# Patient Record
Sex: Female | Born: 1935
Health system: Southern US, Community
[De-identification: ages and names within clinical notes are randomized; demographics above are authoritative.]

## PROBLEM LIST (undated history)

## (undated) DIAGNOSIS — K449 Diaphragmatic hernia without obstruction or gangrene: Secondary | ICD-10-CM

## (undated) DIAGNOSIS — M069 Rheumatoid arthritis, unspecified: Secondary | ICD-10-CM

## (undated) DIAGNOSIS — K469 Unspecified abdominal hernia without obstruction or gangrene: Secondary | ICD-10-CM

## (undated) DIAGNOSIS — K219 Gastro-esophageal reflux disease without esophagitis: Secondary | ICD-10-CM

## (undated) DIAGNOSIS — E785 Hyperlipidemia, unspecified: Secondary | ICD-10-CM

## (undated) DIAGNOSIS — I5032 Chronic diastolic (congestive) heart failure: Secondary | ICD-10-CM

## (undated) DIAGNOSIS — Z9181 History of falling: Secondary | ICD-10-CM

## (undated) DIAGNOSIS — I4891 Unspecified atrial fibrillation: Secondary | ICD-10-CM

## (undated) DIAGNOSIS — G629 Polyneuropathy, unspecified: Secondary | ICD-10-CM

## (undated) DIAGNOSIS — M810 Age-related osteoporosis without current pathological fracture: Secondary | ICD-10-CM

## (undated) DIAGNOSIS — S32512D Fracture of superior rim of left pubis, subsequent encounter for fracture with routine healing: Secondary | ICD-10-CM

## (undated) DIAGNOSIS — M40209 Unspecified kyphosis, site unspecified: Secondary | ICD-10-CM

## (undated) HISTORY — DX: Fracture of superior rim of left pubis, subsequent encounter for fracture with routine healing: S32.512D

## (undated) HISTORY — DX: Age-related osteoporosis without current pathological fracture: M81.0

## (undated) HISTORY — DX: Diaphragmatic hernia without obstruction or gangrene: K44.9

## (undated) HISTORY — DX: Hyperlipidemia, unspecified: E78.5

## (undated) HISTORY — DX: Rheumatoid arthritis, unspecified: M06.9

## (undated) HISTORY — DX: Polyneuropathy, unspecified: G62.9

## (undated) HISTORY — DX: Gastro-esophageal reflux disease without esophagitis: K21.9

## (undated) HISTORY — PX: TONSILLECTOMY: SUR1361

## (undated) HISTORY — DX: History of falling: Z91.81

## (undated) HISTORY — PX: CATARACT EXTRACTION, BILATERAL: SHX1313

## (undated) HISTORY — PX: REPLACEMENT TOTAL HIP W/  RESURFACING IMPLANTS: SUR1222

## (undated) HISTORY — PX: ABDOMINAL HYSTERECTOMY: SHX81

## (undated) HISTORY — DX: Chronic diastolic (congestive) heart failure: I50.32

---

## 2016-09-04 DIAGNOSIS — I482 Chronic atrial fibrillation: Secondary | ICD-10-CM | POA: Diagnosis not present

## 2016-09-04 DIAGNOSIS — Z7901 Long term (current) use of anticoagulants: Secondary | ICD-10-CM | POA: Diagnosis not present

## 2016-09-18 DIAGNOSIS — I482 Chronic atrial fibrillation: Secondary | ICD-10-CM | POA: Diagnosis not present

## 2016-09-18 DIAGNOSIS — Z7901 Long term (current) use of anticoagulants: Secondary | ICD-10-CM | POA: Diagnosis not present

## 2016-09-25 DIAGNOSIS — Z7901 Long term (current) use of anticoagulants: Secondary | ICD-10-CM | POA: Diagnosis not present

## 2016-09-25 DIAGNOSIS — I482 Chronic atrial fibrillation: Secondary | ICD-10-CM | POA: Diagnosis not present

## 2016-10-02 DIAGNOSIS — L97511 Non-pressure chronic ulcer of other part of right foot limited to breakdown of skin: Secondary | ICD-10-CM | POA: Diagnosis not present

## 2016-10-02 DIAGNOSIS — M2041 Other hammer toe(s) (acquired), right foot: Secondary | ICD-10-CM | POA: Diagnosis not present

## 2016-10-02 DIAGNOSIS — I739 Peripheral vascular disease, unspecified: Secondary | ICD-10-CM | POA: Diagnosis not present

## 2016-10-29 DIAGNOSIS — R202 Paresthesia of skin: Secondary | ICD-10-CM | POA: Diagnosis not present

## 2016-10-29 DIAGNOSIS — M25512 Pain in left shoulder: Secondary | ICD-10-CM | POA: Diagnosis not present

## 2016-10-29 DIAGNOSIS — M1990 Unspecified osteoarthritis, unspecified site: Secondary | ICD-10-CM | POA: Diagnosis not present

## 2016-10-29 DIAGNOSIS — Q820 Hereditary lymphedema: Secondary | ICD-10-CM | POA: Diagnosis not present

## 2016-10-29 DIAGNOSIS — Z96642 Presence of left artificial hip joint: Secondary | ICD-10-CM | POA: Diagnosis not present

## 2016-10-29 DIAGNOSIS — M25552 Pain in left hip: Secondary | ICD-10-CM | POA: Diagnosis not present

## 2016-10-29 DIAGNOSIS — M47816 Spondylosis without myelopathy or radiculopathy, lumbar region: Secondary | ICD-10-CM | POA: Diagnosis not present

## 2016-11-04 DIAGNOSIS — Z5181 Encounter for therapeutic drug level monitoring: Secondary | ICD-10-CM | POA: Diagnosis not present

## 2016-11-04 DIAGNOSIS — M1990 Unspecified osteoarthritis, unspecified site: Secondary | ICD-10-CM | POA: Diagnosis not present

## 2016-11-04 DIAGNOSIS — T148XXA Other injury of unspecified body region, initial encounter: Secondary | ICD-10-CM | POA: Diagnosis not present

## 2016-11-04 DIAGNOSIS — M25512 Pain in left shoulder: Secondary | ICD-10-CM | POA: Diagnosis not present

## 2016-11-04 DIAGNOSIS — Z79899 Other long term (current) drug therapy: Secondary | ICD-10-CM | POA: Diagnosis not present

## 2016-11-04 DIAGNOSIS — I4891 Unspecified atrial fibrillation: Secondary | ICD-10-CM | POA: Diagnosis not present

## 2016-11-04 DIAGNOSIS — I5032 Chronic diastolic (congestive) heart failure: Secondary | ICD-10-CM | POA: Diagnosis not present

## 2016-11-04 DIAGNOSIS — Z7901 Long term (current) use of anticoagulants: Secondary | ICD-10-CM | POA: Diagnosis not present

## 2016-11-08 DIAGNOSIS — Z79899 Other long term (current) drug therapy: Secondary | ICD-10-CM | POA: Diagnosis not present

## 2016-11-08 DIAGNOSIS — M19019 Primary osteoarthritis, unspecified shoulder: Secondary | ICD-10-CM | POA: Diagnosis not present

## 2016-11-08 DIAGNOSIS — Z7901 Long term (current) use of anticoagulants: Secondary | ICD-10-CM | POA: Diagnosis not present

## 2016-11-08 DIAGNOSIS — I5032 Chronic diastolic (congestive) heart failure: Secondary | ICD-10-CM | POA: Diagnosis not present

## 2016-11-22 DIAGNOSIS — M25512 Pain in left shoulder: Secondary | ICD-10-CM | POA: Diagnosis not present

## 2016-11-22 DIAGNOSIS — M19012 Primary osteoarthritis, left shoulder: Secondary | ICD-10-CM | POA: Diagnosis not present

## 2016-11-22 DIAGNOSIS — Z7901 Long term (current) use of anticoagulants: Secondary | ICD-10-CM | POA: Diagnosis not present

## 2016-11-22 DIAGNOSIS — I4891 Unspecified atrial fibrillation: Secondary | ICD-10-CM | POA: Diagnosis not present

## 2016-11-22 DIAGNOSIS — R0602 Shortness of breath: Secondary | ICD-10-CM | POA: Diagnosis not present

## 2016-11-22 DIAGNOSIS — I5032 Chronic diastolic (congestive) heart failure: Secondary | ICD-10-CM | POA: Diagnosis not present

## 2016-11-22 DIAGNOSIS — Z79899 Other long term (current) drug therapy: Secondary | ICD-10-CM | POA: Diagnosis not present

## 2016-11-22 DIAGNOSIS — R413 Other amnesia: Secondary | ICD-10-CM | POA: Diagnosis not present

## 2016-11-22 DIAGNOSIS — I739 Peripheral vascular disease, unspecified: Secondary | ICD-10-CM | POA: Diagnosis not present

## 2016-11-22 LAB — BASIC METABOLIC PANEL
BUN: 10 (ref 4–21)
CREATININE: 0.8 (ref <=1.1)
GLUCOSE: 98
Potassium: 4.4 (ref 3.4–5.3)
SODIUM: 136 — AB (ref 137–147)

## 2016-11-22 LAB — HEPATIC FUNCTION PANEL
ALT: 15 (ref 7–35)
AST: 32 (ref 13–35)
Alkaline Phosphatase: 97 (ref 25–125)

## 2016-12-04 DIAGNOSIS — I482 Chronic atrial fibrillation: Secondary | ICD-10-CM | POA: Diagnosis not present

## 2016-12-04 DIAGNOSIS — Z7901 Long term (current) use of anticoagulants: Secondary | ICD-10-CM | POA: Diagnosis not present

## 2016-12-09 DIAGNOSIS — M2041 Other hammer toe(s) (acquired), right foot: Secondary | ICD-10-CM | POA: Diagnosis not present

## 2016-12-09 DIAGNOSIS — I739 Peripheral vascular disease, unspecified: Secondary | ICD-10-CM | POA: Diagnosis not present

## 2016-12-09 DIAGNOSIS — L603 Nail dystrophy: Secondary | ICD-10-CM | POA: Diagnosis not present

## 2016-12-09 DIAGNOSIS — L97511 Non-pressure chronic ulcer of other part of right foot limited to breakdown of skin: Secondary | ICD-10-CM | POA: Diagnosis not present

## 2017-01-01 DIAGNOSIS — Z7901 Long term (current) use of anticoagulants: Secondary | ICD-10-CM | POA: Diagnosis not present

## 2017-01-01 DIAGNOSIS — M1990 Unspecified osteoarthritis, unspecified site: Secondary | ICD-10-CM | POA: Diagnosis not present

## 2017-01-01 DIAGNOSIS — L0390 Cellulitis, unspecified: Secondary | ICD-10-CM | POA: Diagnosis not present

## 2017-01-01 DIAGNOSIS — I4891 Unspecified atrial fibrillation: Secondary | ICD-10-CM | POA: Diagnosis not present

## 2017-01-01 DIAGNOSIS — Z5181 Encounter for therapeutic drug level monitoring: Secondary | ICD-10-CM | POA: Diagnosis not present

## 2017-01-01 DIAGNOSIS — N3001 Acute cystitis with hematuria: Secondary | ICD-10-CM | POA: Diagnosis not present

## 2017-01-09 DIAGNOSIS — Z5181 Encounter for therapeutic drug level monitoring: Secondary | ICD-10-CM | POA: Diagnosis not present

## 2017-01-09 DIAGNOSIS — Z7901 Long term (current) use of anticoagulants: Secondary | ICD-10-CM | POA: Diagnosis not present

## 2017-01-09 DIAGNOSIS — L0390 Cellulitis, unspecified: Secondary | ICD-10-CM | POA: Diagnosis not present

## 2017-01-29 DIAGNOSIS — M150 Primary generalized (osteo)arthritis: Secondary | ICD-10-CM | POA: Diagnosis not present

## 2017-01-29 DIAGNOSIS — M154 Erosive (osteo)arthritis: Secondary | ICD-10-CM | POA: Diagnosis not present

## 2017-01-29 DIAGNOSIS — M119 Crystal arthropathy, unspecified: Secondary | ICD-10-CM | POA: Diagnosis not present

## 2017-02-05 DIAGNOSIS — Z7901 Long term (current) use of anticoagulants: Secondary | ICD-10-CM | POA: Diagnosis not present

## 2017-02-05 DIAGNOSIS — I482 Chronic atrial fibrillation: Secondary | ICD-10-CM | POA: Diagnosis not present

## 2017-02-10 DIAGNOSIS — Z7901 Long term (current) use of anticoagulants: Secondary | ICD-10-CM | POA: Diagnosis not present

## 2017-02-10 DIAGNOSIS — I482 Chronic atrial fibrillation: Secondary | ICD-10-CM | POA: Diagnosis not present

## 2017-02-17 DIAGNOSIS — I482 Chronic atrial fibrillation: Secondary | ICD-10-CM | POA: Diagnosis not present

## 2017-02-17 DIAGNOSIS — Z7901 Long term (current) use of anticoagulants: Secondary | ICD-10-CM | POA: Diagnosis not present

## 2017-02-26 DIAGNOSIS — L97511 Non-pressure chronic ulcer of other part of right foot limited to breakdown of skin: Secondary | ICD-10-CM | POA: Diagnosis not present

## 2017-02-26 DIAGNOSIS — M2041 Other hammer toe(s) (acquired), right foot: Secondary | ICD-10-CM | POA: Diagnosis not present

## 2017-02-26 DIAGNOSIS — M2042 Other hammer toe(s) (acquired), left foot: Secondary | ICD-10-CM | POA: Diagnosis not present

## 2017-03-14 DIAGNOSIS — Z7901 Long term (current) use of anticoagulants: Secondary | ICD-10-CM | POA: Diagnosis not present

## 2017-03-14 DIAGNOSIS — I482 Chronic atrial fibrillation: Secondary | ICD-10-CM | POA: Diagnosis not present

## 2017-03-27 DIAGNOSIS — Q820 Hereditary lymphedema: Secondary | ICD-10-CM | POA: Diagnosis not present

## 2017-03-27 DIAGNOSIS — I5032 Chronic diastolic (congestive) heart failure: Secondary | ICD-10-CM | POA: Diagnosis not present

## 2017-03-27 DIAGNOSIS — Z7189 Other specified counseling: Secondary | ICD-10-CM | POA: Diagnosis not present

## 2017-03-27 DIAGNOSIS — M1990 Unspecified osteoarthritis, unspecified site: Secondary | ICD-10-CM | POA: Diagnosis not present

## 2017-03-27 DIAGNOSIS — Z0000 Encounter for general adult medical examination without abnormal findings: Secondary | ICD-10-CM | POA: Diagnosis not present

## 2017-03-27 DIAGNOSIS — I739 Peripheral vascular disease, unspecified: Secondary | ICD-10-CM | POA: Diagnosis not present

## 2017-03-27 DIAGNOSIS — K449 Diaphragmatic hernia without obstruction or gangrene: Secondary | ICD-10-CM | POA: Diagnosis not present

## 2017-03-27 DIAGNOSIS — Z7901 Long term (current) use of anticoagulants: Secondary | ICD-10-CM | POA: Diagnosis not present

## 2017-03-27 DIAGNOSIS — R413 Other amnesia: Secondary | ICD-10-CM | POA: Diagnosis not present

## 2017-03-27 DIAGNOSIS — I482 Chronic atrial fibrillation: Secondary | ICD-10-CM | POA: Diagnosis not present

## 2017-03-27 DIAGNOSIS — Z79899 Other long term (current) drug therapy: Secondary | ICD-10-CM | POA: Diagnosis not present

## 2017-04-01 DIAGNOSIS — Z79899 Other long term (current) drug therapy: Secondary | ICD-10-CM | POA: Diagnosis not present

## 2017-04-01 DIAGNOSIS — R300 Dysuria: Secondary | ICD-10-CM | POA: Diagnosis not present

## 2017-04-09 DIAGNOSIS — Z7901 Long term (current) use of anticoagulants: Secondary | ICD-10-CM | POA: Diagnosis not present

## 2017-04-09 DIAGNOSIS — I482 Chronic atrial fibrillation: Secondary | ICD-10-CM | POA: Diagnosis not present

## 2017-04-11 DIAGNOSIS — L97522 Non-pressure chronic ulcer of other part of left foot with fat layer exposed: Secondary | ICD-10-CM | POA: Diagnosis not present

## 2017-04-11 DIAGNOSIS — L089 Local infection of the skin and subcutaneous tissue, unspecified: Secondary | ICD-10-CM | POA: Diagnosis not present

## 2017-04-16 DIAGNOSIS — I482 Chronic atrial fibrillation: Secondary | ICD-10-CM | POA: Diagnosis not present

## 2017-04-16 DIAGNOSIS — Z7901 Long term (current) use of anticoagulants: Secondary | ICD-10-CM | POA: Diagnosis not present

## 2017-04-30 DIAGNOSIS — L97521 Non-pressure chronic ulcer of other part of left foot limited to breakdown of skin: Secondary | ICD-10-CM | POA: Diagnosis not present

## 2017-04-30 DIAGNOSIS — I482 Chronic atrial fibrillation: Secondary | ICD-10-CM | POA: Diagnosis not present

## 2017-04-30 DIAGNOSIS — Z7901 Long term (current) use of anticoagulants: Secondary | ICD-10-CM | POA: Diagnosis not present

## 2017-05-05 DIAGNOSIS — Z7901 Long term (current) use of anticoagulants: Secondary | ICD-10-CM | POA: Diagnosis not present

## 2017-05-05 DIAGNOSIS — I482 Chronic atrial fibrillation: Secondary | ICD-10-CM | POA: Diagnosis not present

## 2017-05-12 DIAGNOSIS — Z7901 Long term (current) use of anticoagulants: Secondary | ICD-10-CM | POA: Diagnosis not present

## 2017-05-12 DIAGNOSIS — I482 Chronic atrial fibrillation: Secondary | ICD-10-CM | POA: Diagnosis not present

## 2017-05-26 DIAGNOSIS — I482 Chronic atrial fibrillation: Secondary | ICD-10-CM | POA: Diagnosis not present

## 2017-05-26 DIAGNOSIS — Z7901 Long term (current) use of anticoagulants: Secondary | ICD-10-CM | POA: Diagnosis not present

## 2017-06-02 DIAGNOSIS — M2042 Other hammer toe(s) (acquired), left foot: Secondary | ICD-10-CM | POA: Diagnosis not present

## 2017-06-02 DIAGNOSIS — I482 Chronic atrial fibrillation: Secondary | ICD-10-CM | POA: Diagnosis not present

## 2017-06-02 DIAGNOSIS — B351 Tinea unguium: Secondary | ICD-10-CM | POA: Diagnosis not present

## 2017-06-02 DIAGNOSIS — L97521 Non-pressure chronic ulcer of other part of left foot limited to breakdown of skin: Secondary | ICD-10-CM | POA: Diagnosis not present

## 2017-06-02 DIAGNOSIS — I739 Peripheral vascular disease, unspecified: Secondary | ICD-10-CM | POA: Diagnosis not present

## 2017-06-02 DIAGNOSIS — M2041 Other hammer toe(s) (acquired), right foot: Secondary | ICD-10-CM | POA: Diagnosis not present

## 2017-06-02 DIAGNOSIS — Z7901 Long term (current) use of anticoagulants: Secondary | ICD-10-CM | POA: Diagnosis not present

## 2017-06-09 DIAGNOSIS — R600 Localized edema: Secondary | ICD-10-CM | POA: Diagnosis not present

## 2017-06-09 DIAGNOSIS — L97522 Non-pressure chronic ulcer of other part of left foot with fat layer exposed: Secondary | ICD-10-CM | POA: Diagnosis not present

## 2017-06-09 DIAGNOSIS — I482 Chronic atrial fibrillation: Secondary | ICD-10-CM | POA: Diagnosis not present

## 2017-06-09 DIAGNOSIS — Z7901 Long term (current) use of anticoagulants: Secondary | ICD-10-CM | POA: Diagnosis not present

## 2017-06-17 DIAGNOSIS — L97522 Non-pressure chronic ulcer of other part of left foot with fat layer exposed: Secondary | ICD-10-CM | POA: Diagnosis not present

## 2017-06-17 DIAGNOSIS — R600 Localized edema: Secondary | ICD-10-CM | POA: Diagnosis not present

## 2017-06-17 DIAGNOSIS — M2042 Other hammer toe(s) (acquired), left foot: Secondary | ICD-10-CM | POA: Diagnosis not present

## 2017-06-23 DIAGNOSIS — I4891 Unspecified atrial fibrillation: Secondary | ICD-10-CM | POA: Diagnosis not present

## 2017-06-23 DIAGNOSIS — G8929 Other chronic pain: Secondary | ICD-10-CM | POA: Diagnosis not present

## 2017-06-23 DIAGNOSIS — W19XXXA Unspecified fall, initial encounter: Secondary | ICD-10-CM | POA: Diagnosis not present

## 2017-06-23 DIAGNOSIS — Q820 Hereditary lymphedema: Secondary | ICD-10-CM | POA: Diagnosis not present

## 2017-06-23 DIAGNOSIS — E7800 Pure hypercholesterolemia, unspecified: Secondary | ICD-10-CM | POA: Diagnosis not present

## 2017-06-23 DIAGNOSIS — I10 Essential (primary) hypertension: Secondary | ICD-10-CM | POA: Diagnosis not present

## 2017-06-23 DIAGNOSIS — S4982XA Other specified injuries of left shoulder and upper arm, initial encounter: Secondary | ICD-10-CM | POA: Diagnosis not present

## 2017-06-23 DIAGNOSIS — K219 Gastro-esophageal reflux disease without esophagitis: Secondary | ICD-10-CM | POA: Diagnosis not present

## 2017-06-23 DIAGNOSIS — Z87891 Personal history of nicotine dependence: Secondary | ICD-10-CM | POA: Diagnosis not present

## 2017-06-23 DIAGNOSIS — Z7901 Long term (current) use of anticoagulants: Secondary | ICD-10-CM | POA: Diagnosis not present

## 2017-06-23 DIAGNOSIS — I2510 Atherosclerotic heart disease of native coronary artery without angina pectoris: Secondary | ICD-10-CM | POA: Diagnosis not present

## 2017-06-23 DIAGNOSIS — M25512 Pain in left shoulder: Secondary | ICD-10-CM | POA: Diagnosis not present

## 2017-06-23 DIAGNOSIS — R51 Headache: Secondary | ICD-10-CM | POA: Diagnosis not present

## 2017-06-23 DIAGNOSIS — I739 Peripheral vascular disease, unspecified: Secondary | ICD-10-CM | POA: Diagnosis not present

## 2017-06-23 DIAGNOSIS — I059 Rheumatic mitral valve disease, unspecified: Secondary | ICD-10-CM | POA: Diagnosis not present

## 2017-06-23 DIAGNOSIS — M791 Myalgia, unspecified site: Secondary | ICD-10-CM | POA: Diagnosis not present

## 2017-06-23 DIAGNOSIS — K449 Diaphragmatic hernia without obstruction or gangrene: Secondary | ICD-10-CM | POA: Diagnosis not present

## 2017-06-23 DIAGNOSIS — Z888 Allergy status to other drugs, medicaments and biological substances status: Secondary | ICD-10-CM | POA: Diagnosis not present

## 2017-06-23 DIAGNOSIS — M542 Cervicalgia: Secondary | ICD-10-CM | POA: Diagnosis not present

## 2017-06-23 DIAGNOSIS — Z79899 Other long term (current) drug therapy: Secondary | ICD-10-CM | POA: Diagnosis not present

## 2017-06-23 DIAGNOSIS — I872 Venous insufficiency (chronic) (peripheral): Secondary | ICD-10-CM | POA: Diagnosis not present

## 2017-06-23 DIAGNOSIS — Z882 Allergy status to sulfonamides status: Secondary | ICD-10-CM | POA: Diagnosis not present

## 2017-06-23 DIAGNOSIS — Z91040 Latex allergy status: Secondary | ICD-10-CM | POA: Diagnosis not present

## 2017-06-23 DIAGNOSIS — Z86718 Personal history of other venous thrombosis and embolism: Secondary | ICD-10-CM | POA: Diagnosis not present

## 2017-07-04 DIAGNOSIS — Z7901 Long term (current) use of anticoagulants: Secondary | ICD-10-CM | POA: Diagnosis not present

## 2017-07-11 ENCOUNTER — Ambulatory Visit (INDEPENDENT_AMBULATORY_CARE_PROVIDER_SITE_OTHER): Payer: Medicare Other | Admitting: Family Medicine

## 2017-07-11 ENCOUNTER — Encounter: Payer: Self-pay | Admitting: Family Medicine

## 2017-07-11 ENCOUNTER — Ambulatory Visit: Payer: Self-pay | Admitting: Family Medicine

## 2017-07-11 VITALS — BP 106/62 | HR 87 | Temp 97.6°F | Ht 62.0 in | Wt 108.8 lb

## 2017-07-11 DIAGNOSIS — M1120 Other chondrocalcinosis, unspecified site: Secondary | ICD-10-CM

## 2017-07-11 DIAGNOSIS — M40202 Unspecified kyphosis, cervical region: Secondary | ICD-10-CM | POA: Diagnosis not present

## 2017-07-11 DIAGNOSIS — I5032 Chronic diastolic (congestive) heart failure: Secondary | ICD-10-CM | POA: Insufficient documentation

## 2017-07-11 DIAGNOSIS — M159 Polyosteoarthritis, unspecified: Secondary | ICD-10-CM

## 2017-07-11 DIAGNOSIS — I482 Chronic atrial fibrillation, unspecified: Secondary | ICD-10-CM | POA: Insufficient documentation

## 2017-07-11 DIAGNOSIS — I509 Heart failure, unspecified: Secondary | ICD-10-CM | POA: Diagnosis not present

## 2017-07-11 DIAGNOSIS — Z7689 Persons encountering health services in other specified circumstances: Secondary | ICD-10-CM | POA: Diagnosis not present

## 2017-07-11 DIAGNOSIS — I890 Lymphedema, not elsewhere classified: Secondary | ICD-10-CM | POA: Diagnosis not present

## 2017-07-11 DIAGNOSIS — G629 Polyneuropathy, unspecified: Secondary | ICD-10-CM | POA: Diagnosis not present

## 2017-07-11 DIAGNOSIS — M150 Primary generalized (osteo)arthritis: Secondary | ICD-10-CM | POA: Diagnosis not present

## 2017-07-11 DIAGNOSIS — M8949 Other hypertrophic osteoarthropathy, multiple sites: Secondary | ICD-10-CM

## 2017-07-11 LAB — POCT INR: INR: 2.7

## 2017-07-11 NOTE — Progress Notes (Signed)
Patient presents to clinic today to f/u on chronic issues and establish care.  Patient is accompanied by her daughter [name].  SUBJECTIVE: PMH:  Pt is an 83 yo with pmh sig for Afib, OA, pseudogout, neuropathy, lymphedema, kyphosis/scoliosis, CHF.  Patient was formally seen in [place].  Patient in the past is followed by cardiology and rheumatology.  "Bad spine": -Patient with a history of kyphosis/scoliosis -Gradually worsening -Lateral curvature of spine to the left causes patient to lean to the right increasing her propensity to fall  Osteoarthritis: -Patient seen in the past by rheumatology. -Patient endorses OA in shoulders, spine -Causes a deep pain and neuropathy -Patient has been on tramadol 50mg  q 6 prn and prednisone 5 mg for an extended period of time.  Pseudogout: -Patient states has been a while since a flare -Patient endorses changes in hands and nodes on finger 2/2 pseudogout -Patient on chronic prednisone 5 mg daily  Neuropathy: -Mostly in the left arm -Patient currently on gabapentin 400 mg TID  A. fib: -In the past was on Xarelto.  It was DC'd secondary to internal bleeding -Patient currently on Coumadin 5 mg daily. -INR was 3 on black Friday.  Patient's daughter had labs done at [facility]. -At times patient has issues staying in the range of 2-3.  If INR stays in the range has it checked q. monthly otherwise biweekly -Patient's daughter endorses trying to get a home INR machine from a company called [employer] in [place], [place].  Machine expected in 13 days  Lymphedma: -b/l LE -improving from past episodes. -elevating feet when can  CHF: -followed by Cardiology, Dr. [place] -needs new Cardiologist in town. -taking lasix 40 mg, lopressor 100mg  BID, torsemide 100 mg  Allergies: Sulfa-shortness of breath Codeine-nausea Latex-hives  Past surgical history: Several foot/bone surgeries Status post 3 blood transfusions in 2014 secondary to internal bleeding caused by  Xarelto Hysterectomy 1970 Breast biopsy Tonsillectomy  Social history: Patient is widowed.  She was formerly a .  She is now living with her daughter here in [place].  She was living in [place] by herself, but found she needed more help with things around the house.  Pt is a former smoker.  Patient denies current tobacco use, alcohol use, drug use.  Patient has had approximately 5 recent falls within the last 8 weeks.  She uses a walker to ambulate around the house.  Family medical history: Mom-deceased, heart disease, HLD, HTN, stroke Dad-deceased, heart disease, HLD, HTN Sister-Elaine, deceased, stroke Brother-Howard, deceased Daughter-alive Son-alive   Health Maintenance: Vision --Dr. [place] Immunizations --declines Colonoscopy --2014   History reviewed. No pertinent past medical history.  Past Surgical History:  Procedure Laterality Date  . ABDOMINAL HYSTERECTOMY    . TONSILLECTOMY      Current Outpatient Medications on File Prior to Visit  Medication Sig Dispense Refill  . atorvastatin (LIPITOR) 10 MG tablet Take 10 mg by mouth daily.    . Calcium-Magnesium-Vitamin D (CALCIUM 500 PO) Take by mouth.    . cyanocobalamin 1000 MCG tablet Take 1,000 mcg by mouth daily.    Alfonse Ras desloratadine (CLARINEX) 5 MG tablet Take 5 mg by mouth daily.    . furosemide (LASIX) 40 MG tablet Take 40 mg by mouth.    . gabapentin (NEURONTIN) 400 MG capsule Take 400 mg by mouth 3 (three) times daily.    . metoprolol tartrate (LOPRESSOR) 100 MG tablet Take 100 mg by mouth 2 (two) times daily.    . Multiple Vitamin (MULTIVITAMIN) capsule  Take 1 capsule by mouth daily.    Marland Kitchen omeprazole (PRILOSEC) 20 MG capsule Take 20 mg by mouth daily.    . prednisoLONE 5 MG TABS tablet Take by mouth.    . sertraline (ZOLOFT) 50 MG tablet Take 50 mg by mouth daily.    Marland Kitchen torsemide (DEMADEX) 100 MG tablet Take 100 mg by mouth daily.    . traMADol (ULTRAM) 50 MG tablet Take by mouth every 6 (six) hours as  needed.    Marland Kitchen VITAMIN D, CHOLECALCIFEROL, PO Take by mouth.     No current facility-administered medications on file prior to visit.     Allergies  Allergen Reactions  . Sulfa Antibiotics Anaphylaxis  . Codeine Nausea And Vomiting  . Latex Rash    History reviewed. No pertinent family history.  Social History   Socioeconomic History  . Marital status: Widowed    Spouse name: Not on file  . Number of children: Not on file  . Years of education: Not on file  . Highest education level: Not on file  Social Needs  . Financial resource strain: Not on file  . Food insecurity - worry: Not on file  . Food insecurity - inability: Not on file  . Transportation needs - medical: Not on file  . Transportation needs - non-medical: Not on file  Occupational History  . Not on file  Tobacco Use  . Smoking status: Former Games developer  . Smokeless tobacco: Never Used  Substance and Sexual Activity  . Alcohol use: No    Frequency: Never  . Drug use: No  . Sexual activity: Not on file  Other Topics Concern  . Not on file  Social History Narrative  . Not on file    ROS General: Denies fever, chills, night sweats, changes in weight, changes in appetite HEENT: Denies headaches, ear pain, changes in vision, rhinorrhea, sore throat CV: Denies CP, palpitations, SOB, orthopnea Pulm: Denies SOB, cough, wheezing GI: Denies abdominal pain, nausea, vomiting, diarrhea, constipation GU: Denies dysuria, hematuria, frequency, vaginal discharge Msk: Denies muscle cramps   +joint pains Neuro: Denies weakness, numbness, tingling  +ferquent falls Skin: Denies rashes, bruising   +changes 2/2 lymphedema Psych: Denies depression, anxiety, hallucinations  BP 106/62 (BP Location: Right Arm, Patient Position: Sitting, Cuff Size: Normal)   Pulse 87   Temp 97.6 F (36.4 C) (Oral)   Ht 5\' 2"  (1.575 m)   Wt 108 lb 12.8 oz (49.4 kg)   BMI 19.90 kg/m   Physical Exam Gen. Pleasant, well-developed, thin  appearing, in NAD HEENT - [place]/AT, PERRL, no scleral icterus, no nasal drainage, pharynx without erythema or exudate. Lungs: no use of accessory muscles, CTAB, no wheezes, rales or rhonchi Cardiovascular: RRR, No r/g/m, no peripheral edema Abdomen: BS present, soft, nontender,nondistended, no hepatosplenomegaly Musculoskeletal: Thoracic scoliosis with lateral curvature to the left, kyphosis causing patient's head to lean forward and to the right.  Muscle wasting noted in upper extremities bilaterally.  Moves all four extremities, no cyanosis or clubbing, normal tone Neuro:  A&Ox3, CN II-XII intact, pt ambulating with cane and assistance from her daughter, hunched over leaning to the right Skin:  Warm, dry, intact. LEs b/l with thickened, lumpy, mildly erythematous skin s/p lymphedema changes., No increased warmth or other signs of infection present., not weeping. Psych: normal affect, mood appropriate  No results found for this or any previous visit (from the past 2160 hour(s)).  Assessment/Plan: Chronic atrial fibrillation (HCC)  -Continue current meds for rate control -Continue  warfarin.  Goal 2-3 -INR in clinic 3.1 -We will repeat INR on Monday and make adjustments as needed -Patient awaiting home INR machine, to be delivered in 13 days. - Plan: Ambulatory referral to Cardiology  Primary osteoarthritis involving multiple joints -Given long-term use will continue ontinue prednisone 5 mg daily and tramadol as needed  Pseudogout -Continue prednisone 5 mg daily  Neuropathy -Continue Neurontin 400 mg 3 times daily  Lymphedema -Continue to elevate lower extremities when sitting -May benefit from TED hose/compression socks  Kyphosis of cervical region, unspecified kyphosis type -Encouraged use of walker for increased stability given frequent falls.  Congestive heart failure, unspecified HF chronicity, unspecified heart failure type (HCC) -Continue current medications -Watch for  increased lower extremity edema and SOB  - Plan: Ambulatory referral to Cardiology  Encounter to establish care -We reviewed the PMH, PSH, FH, SH, Meds and Allergies. -We provided refills for any medications we will prescribe as needed. -We addressed current concerns per orders and patient instructions. -We have asked for records for pertinent exams, studies, vaccines and notes from previous providers. -We have advised patient to follow up per instructions below.    F/u in the next 1-2 months for CPE and chronic issues.  Pt to let provider know if she needs a referral for podiatry.

## 2017-07-11 NOTE — Patient Instructions (Addendum)
Please come back to clinic on Monday to have your INR rechecked.  All you have to do is stop by the lab. The order will already be in place.   Atrial Fibrillation Atrial fibrillation is a type of heartbeat that is irregular or fast (rapid). If you have this condition, your heart keeps quivering in a weird (chaotic) way. This condition can make it so your heart cannot pump blood normally. Having this condition gives a person more risk for stroke, heart failure, and other heart problems. There are different types of atrial fibrillation. Talk with your doctor to learn about the type that you have. Follow these instructions at home:  Take over-the-counter and prescription medicines only as told by your doctor.  If your doctor prescribed a blood-thinning medicine, take it exactly as told. Taking too much of it can cause bleeding. If you do not take enough of it, you will not have the protection that you need against stroke and other problems.  Do not use any tobacco products. These include cigarettes, chewing tobacco, and e-cigarettes. If you need help quitting, ask your doctor.  If you have apnea (obstructive sleep apnea), manage it as told by your doctor.  Do not drink alcohol.  Do not drink beverages that have caffeine. These include coffee, soda, and tea.  Maintain a healthy weight. Do not use diet pills unless your doctor says they are safe for you. Diet pills may make heart problems worse.  Follow diet instructions as told by your doctor.  Exercise regularly as told by your doctor.  Keep all follow-up visits as told by your doctor. This is important. Contact a doctor if:  You notice a change in the speed, rhythm, or strength of your heartbeat.  You are taking a blood-thinning medicine and you notice more bruising.  You get tired more easily when you move or exercise. Get help right away if:  You have pain in your chest or your belly (abdomen).  You have sweating or  weakness.  You feel sick to your stomach (nauseous).  You notice blood in your throw up (vomit), poop (stool), or pee (urine).  You are short of breath.  You suddenly have swollen feet and ankles.  You feel dizzy.  Your suddenly get weak or numb in your face, arms, or legs, especially if it happens on one side of your body.  You have trouble talking, trouble understanding, or both.  Your face or your eyelid droops on one side. These symptoms may be an emergency. Do not wait to see if the symptoms will go away. Get medical help right away. Call your local emergency services (911 in the [place] Do not drive yourself to the hospital. This information is not intended to replace advice given to you by your health care provider. Make sure you discuss any questions you have with your health care provider. Document Released: 05/07/2008 Document Revised: 01/04/2016 Document Reviewed: 11/23/2014 Elsevier Interactive Patient Education  2018 [employer].  Lymphedema Lymphedema is swelling that is caused by the abnormal collection of lymph under the skin. Lymph is fluid from the tissues in your body that travels in the lymphatic system. This system is part of the immune system and includes lymph nodes and lymph vessels. The lymph vessels collect and carry the excess fluid, fats, proteins, and wastes from the tissues of the body to the bloodstream. This system also works to clean and remove bacteria and waste products from the body. Lymphedema occurs when the lymphatic system  is blocked. When the lymph vessels or lymph nodes are blocked or damaged, lymph does not drain properly, causing an abnormal buildup of lymph. This leads to swelling in the arms or legs. Lymphedema cannot be cured by medicines, but various methods can be used to help reduce the swelling. What are the causes? There are two types of lymphedema. Primary lymphedema is caused by the absence or abnormality of the lymph vessel at birth.  Secondary lymphedema is more common. It occurs when the lymph vessel is damaged or blocked. Common causes of lymph vessel blockage include:  Skin infection, such as cellulitis.  Infection by parasites (filariasis).  Injury.  Cancer.  Radiation therapy.  Formation of scar tissue.  Surgery.  What are the signs or symptoms? Symptoms of this condition include:  Swelling of the arm or leg.  A heavy or tight feeling in the arm or leg.  Swelling of the feet, toes, or fingers. Shoes or rings may fit more tightly than before.  Redness of the skin over the affected area.  Limited movement of the affected limb.  Sensitivity to touch or discomfort in the affected limb.  How is this diagnosed? This condition may be diagnosed with:  A physical exam.  Medical history.  Bioimpedance spectroscopy. In this test, painless electrical currents are used to measure fluid levels in your body.  Imaging tests, such as: ? Lymphoscintigraphy. In this test, a low dose of a radioactive substance is injected to trace the flow of lymph through the lymph vessels. ? MRI. ? CT scan. ? Duplex ultrasound. This test uses sound waves to produce images of the vessels and the blood flow on a screen. ? Lymphangiography. In this test, a contrast dye is injected into the lymph vessel to help show blockages.  How is this treated? Treatment for this condition may depend on the cause. Treatment may include:  Exercise. Certain exercises can help fluid move out of the affected limb.  Massage. Gentle massage of the affected limb can help move the fluid out of the area.  Compression. Various methods may be used to apply pressure to the affected limb in order to reduce the swelling. ? Wearing compression stockings or sleeves on the affected limb. ? Bandaging the affected limb. ? Using an external pump that is attached to a sleeve that alternates between applying pressure and releasing pressure.  Surgery. This  is usually only done for severe cases. For example, surgery may be done if you have trouble moving the limb or if the swelling does not get better with other treatments.  If an underlying condition is causing the lymphedema, treatment for that condition is needed. For example, antibiotic medicines may be used to treat an infection. Follow these instructions at home: Activities  Exercise regularly as directed by your health care provider.  Do not sit with your legs crossed.  When possible, keep the affected limb raised (elevated) above the level of your heart.  Avoid carrying things with an arm that is affected by lymphedema.  Remember that the affected area is more likely to become injured or infected.  Take these steps to help prevent infection: ? Keep the affected area clean and dry. ? Protect your skin from cuts. For example, you should use gloves while cooking or gardening. Do not walk barefoot. If you shave the affected area, use an Neurosurgeon. General instructions  Take medicines only as directed by your health care provider.  Eat a healthy diet that includes a lot  of fruits and vegetables.  Do not wear tight clothes, shoes, or jewelry.  Do not use heating pads over the affected area.  Avoid having blood pressure checked on the affected limb.  Keep all follow-up visits as directed by your health care provider. This is important. Contact a health care provider if:  You continue to have swelling in your limb.  You have a fever.  You have a cut that does not heal.  You have redness or pain in the affected area.  You have new swelling in your limb that comes on suddenly.  You develop purplish spots or sores (lesions) on your limb. Get help right away if:  You have a skin rash.  You have chills or sweats.  You have shortness of breath. This information is not intended to replace advice given to you by your health care provider. Make sure you discuss any  questions you have with your health care provider. Document Released: 05/26/2007 Document Revised: 04/04/2016 Document Reviewed: 07/06/2014 Elsevier Interactive Patient Education  [employer].

## 2017-07-14 ENCOUNTER — Ambulatory Visit (INDEPENDENT_AMBULATORY_CARE_PROVIDER_SITE_OTHER): Payer: Medicare Other | Admitting: General Practice

## 2017-07-14 DIAGNOSIS — Z7901 Long term (current) use of anticoagulants: Secondary | ICD-10-CM | POA: Diagnosis not present

## 2017-07-14 NOTE — Addendum Note (Signed)
Addended by: Charna Elizabeth on: 07/14/2017 12:36 PM   Modules accepted: Orders

## 2017-07-14 NOTE — Patient Instructions (Signed)
Pre visit review using our clinic review tool, if applicable. No additional management support is needed unless otherwise documented below in the visit note.  Take 1 (5 mg) tablet daily.

## 2017-07-25 ENCOUNTER — Ambulatory Visit (INDEPENDENT_AMBULATORY_CARE_PROVIDER_SITE_OTHER): Payer: Medicare Other | Admitting: General Practice

## 2017-07-25 ENCOUNTER — Telehealth: Payer: Self-pay | Admitting: Family Medicine

## 2017-07-25 DIAGNOSIS — I482 Chronic atrial fibrillation, unspecified: Secondary | ICD-10-CM

## 2017-07-25 DIAGNOSIS — Z7901 Long term (current) use of anticoagulants: Secondary | ICD-10-CM

## 2017-07-25 LAB — POCT INR: INR: 3

## 2017-07-25 NOTE — Patient Instructions (Signed)
Pre visit review using our clinic review tool, if applicable. No additional management support is needed unless otherwise documented below in the visit note.  Continue to take 5 mg daily.  Re-check on Monday, 12/17.

## 2017-07-25 NOTE — Telephone Encounter (Signed)
Left a VM for patient daughter to give the office a call back.

## 2017-07-25 NOTE — Telephone Encounter (Signed)
Copied from CRM [id number]. Topic: Inquiry >> Jul 24, 2017  4:56 PM Alexander Bergeron B wrote: Reason for CRM: daughter of pt is asking for a phone call so that she can update someone on the inr machine her mother has, contact [name] ASAP

## 2017-07-25 NOTE — Telephone Encounter (Signed)
Copied from CRM [id number]. Topic: Inquiry >> Jul 25, 2017  2:33 PM Everardo Pacific, [place] wrote: Reason for CRM: Patient daughter had a missed call from the office about her mother and would like a call back at [phone]

## 2017-07-28 ENCOUNTER — Encounter (HOSPITAL_COMMUNITY): Payer: Self-pay | Admitting: Emergency Medicine

## 2017-07-28 ENCOUNTER — Emergency Department (HOSPITAL_COMMUNITY): Payer: Medicare Other

## 2017-07-28 ENCOUNTER — Emergency Department (HOSPITAL_COMMUNITY)
Admission: EM | Admit: 2017-07-28 | Discharge: 2017-07-29 | Disposition: A | Discharge status: Home / Self Care | Payer: Medicare Other | Attending: Emergency Medicine | Admitting: Emergency Medicine

## 2017-07-28 ENCOUNTER — Ambulatory Visit: Payer: Self-pay

## 2017-07-28 DIAGNOSIS — Z7901 Long term (current) use of anticoagulants: Secondary | ICD-10-CM | POA: Diagnosis not present

## 2017-07-28 DIAGNOSIS — R079 Chest pain, unspecified: Secondary | ICD-10-CM | POA: Diagnosis not present

## 2017-07-28 DIAGNOSIS — R072 Precordial pain: Secondary | ICD-10-CM | POA: Diagnosis not present

## 2017-07-28 DIAGNOSIS — Z91040 Latex allergy status: Secondary | ICD-10-CM | POA: Insufficient documentation

## 2017-07-28 DIAGNOSIS — K449 Diaphragmatic hernia without obstruction or gangrene: Secondary | ICD-10-CM | POA: Insufficient documentation

## 2017-07-28 DIAGNOSIS — Z87891 Personal history of nicotine dependence: Secondary | ICD-10-CM | POA: Diagnosis not present

## 2017-07-28 DIAGNOSIS — Z79899 Other long term (current) drug therapy: Secondary | ICD-10-CM | POA: Diagnosis not present

## 2017-07-28 HISTORY — DX: Unspecified atrial fibrillation: I48.91

## 2017-07-28 HISTORY — DX: Unspecified abdominal hernia without obstruction or gangrene: K46.9

## 2017-07-28 HISTORY — DX: Unspecified kyphosis, site unspecified: M40.209

## 2017-07-28 LAB — COMPREHENSIVE METABOLIC PANEL
ALBUMIN: 3.4 g/dL — AB (ref 3.5–5.0)
ALT: 18 U/L (ref 14–54)
AST: 34 U/L (ref 15–41)
Alkaline Phosphatase: 98 U/L (ref 38–126)
Anion gap: 9 (ref 5–15)
BUN: 14 mg/dL (ref 6–20)
CHLORIDE: 95 mmol/L — AB (ref 101–111)
CO2: 29 mmol/L (ref 22–32)
CREATININE: 0.91 mg/dL (ref 0.44–1.00)
Calcium: 9 mg/dL (ref 8.9–10.3)
GFR calc Af Amer: >60 mL/min (ref >60)
GFR, EST NON AFRICAN AMERICAN: 57 mL/min — AB (ref >60)
GLUCOSE: 222 mg/dL — AB (ref 65–99)
POTASSIUM: 3.7 mmol/L (ref 3.5–5.1)
SODIUM: 133 mmol/L — AB (ref 135–145)
Total Bilirubin: 1 mg/dL (ref 0.3–1.2)
Total Protein: 7.1 g/dL (ref 6.5–8.1)

## 2017-07-28 LAB — I-STAT TROPONIN, ED: Troponin i, poc: 0.01 ng/mL (ref 0.00–0.08)

## 2017-07-28 LAB — LIPASE, BLOOD: LIPASE: 40 U/L (ref 11–51)

## 2017-07-28 LAB — CBC
HEMATOCRIT: 37.5 % (ref 36.0–46.0)
HEMOGLOBIN: 12 g/dL (ref 12.0–15.0)
MCH: 29.2 pg (ref 26.0–34.0)
MCHC: 32 g/dL (ref 30.0–36.0)
MCV: 91.2 fL (ref 78.0–100.0)
Platelets: 226 10*3/uL (ref 150–400)
RBC: 4.11 MIL/uL (ref 3.87–5.11)
RDW: 15.3 % (ref 11.5–15.5)
WBC: 6.8 10*3/uL (ref 4.0–10.5)

## 2017-07-28 LAB — PROTIME-INR
INR: 2.89
Prothrombin Time: 30 seconds — ABNORMAL HIGH (ref 11.4–15.2)

## 2017-07-28 NOTE — ED Provider Notes (Signed)
[facility] EMERGENCY DEPARTMENT Provider Note   CSN: [id number] Arrival date & time: 07/28/17  2123     History   Chief Complaint Chief Complaint  Patient presents with  . Chest Pain    HPI Zorana Brockwell is a 83 y.o. female.  HPI   83 year old female with hx of afib not on anticoagulants brought here via EMS from home for evaluation of CP.  Patient report approximately 3 hours ago after dinner patient developed pain to her chest.  Pain consists described as a sharp sensation, across her chest and radiates towards her left arm.  She endorsed extreme nausea, and a bit dizzy with the symptoms.  Symptoms lasting for approximately 2 hours and has since resolved.  Her nausea improves after receiving antinausea medication by EMS.  She reported eating spaghetti and garlic.  Patient denies any fever, chills, shortness of breath, diaphoresis, back pain, focal numbness or weakness.  She reports for the past 3 days she has been feeling very sleepy after having her meal.  She denies any recent cardiac evaluation.  No history of AAA.  Past Medical History:  Diagnosis Date  . Atrial fibrillation (HCC)   . Hernia of abdominal cavity   . Kyphosis     Patient Active Problem List   Diagnosis Date Noted  . Long term (current) use of anticoagulants [Z79.01] 07/25/2017  . Chronic atrial fibrillation (HCC) 07/11/2017  . Primary osteoarthritis involving multiple joints 07/11/2017  . Pseudogout 07/11/2017  . Neuropathy 07/11/2017  . Lymphedema 07/11/2017  . Kyphosis of cervical region 07/11/2017  . Congestive heart failure (HCC) 07/11/2017    Past Surgical History:  Procedure Laterality Date  . ABDOMINAL HYSTERECTOMY    . TONSILLECTOMY      OB History    No data available       Home Medications    Prior to Admission medications   Medication Sig Start Date End Date Taking? Authorizing Provider  atorvastatin (LIPITOR) 10 MG tablet Take 10 mg by mouth daily.     [provider]  Calcium-Magnesium-Vitamin D (CALCIUM 500 PO) Take by mouth.    [provider]  cyanocobalamin 1000 MCG tablet Take 1,000 mcg by mouth daily.    [provider]  desloratadine (CLARINEX) 5 MG tablet Take 5 mg by mouth daily.    [provider]  furosemide (LASIX) 40 MG tablet Take 40 mg by mouth.    [provider]  gabapentin (NEURONTIN) 400 MG capsule Take 400 mg by mouth 3 (three) times daily.    [provider]  metoprolol tartrate (LOPRESSOR) 100 MG tablet Take 100 mg by mouth 2 (two) times daily.    [provider]  Multiple Vitamin (MULTIVITAMIN) capsule Take 1 capsule by mouth daily.    [provider]  omeprazole (PRILOSEC) 20 MG capsule Take 20 mg by mouth daily.    [provider]  prednisoLONE 5 MG TABS tablet Take by mouth.    [provider]  sertraline (ZOLOFT) 50 MG tablet Take 50 mg by mouth daily.    [provider]  torsemide (DEMADEX) 100 MG tablet Take 100 mg by mouth daily.    [provider]  traMADol (ULTRAM) 50 MG tablet Take by mouth every 6 (six) hours as needed.    [provider]  VITAMIN D, CHOLECALCIFEROL, PO Take by mouth.    [provider]    Family History No family history on file.  Social  History Social History   Tobacco Use  . Smoking status: Former Games developer  . Smokeless tobacco: Never Used  Substance Use Topics  . Alcohol use: No    Frequency: Never  . Drug use: No     Allergies   Sulfa antibiotics; Codeine; and Latex   Review of Systems Review of Systems  All other systems reviewed and are negative.    Physical Exam Updated Vital Signs BP 101/70 (BP Location: Right Arm)   Pulse 94   Temp 98.4 F (36.9 C) (Oral)   Resp 16   Ht 5' 2.5" (1.588 m)   SpO2 95%   BMI 19.58 kg/m   Physical Exam  Constitutional: She appears well-developed and well-nourished. No distress.  Elderly female  resting comfortably in bed in no acute discomfort.  HENT:  Head: Atraumatic.  Eyes: Conjunctivae are normal.  Neck: Neck supple.  Cardiovascular: Intact distal pulses and normal pulses. An irregularly irregular rhythm present.  Pulmonary/Chest: Effort normal and breath sounds normal. She has no decreased breath sounds. She has no wheezes. She has no rhonchi. She has no rales.  Abdominal: There is tenderness (Mild epigastric tenderness without guarding or rebound tenderness).  No abdominal pulsatile mass or abdominal bruit  Musculoskeletal: Normal range of motion.  Chronic lymphedema to bilateral lower extremities  Neurological: She is alert.  Skin: No rash noted.  Psychiatric: She has a normal mood and affect.  Nursing note and vitals reviewed.    ED Treatments / Results  Labs (all labs ordered are listed, but only abnormal results are displayed) Labs Reviewed  COMPREHENSIVE METABOLIC PANEL - Abnormal; Notable for the following components:      Result Value   Sodium 133 (*)    Chloride 95 (*)    Glucose, Bld 222 (*)    Albumin 3.4 (*)    GFR calc non Af Amer 57 (*)    All other components within normal limits  PROTIME-INR - Abnormal; Notable for the following components:   Prothrombin Time 30.0 (*)    All other components within normal limits  CBC  LIPASE, BLOOD  URINALYSIS, ROUTINE W REFLEX MICROSCOPIC  I-STAT TROPONIN, ED    EKG  EKG Interpretation  Date/Time:  Monday July 28 2017 21:38:50 EST Ventricular Rate:  94 PR Interval:    QRS Duration: 94 QT Interval:  402 QTC Calculation: 502 R Axis:   124 Text Interpretation:  Atrial fibrillation Incomplete right bundle branch block Possible Right ventricular hypertrophy Possible Lateral infarct , age undetermined nonspecific t wave changes Abnormal ECG No old tracing to compare Confirmed by Azalia Bilis ([id number]) on 07/28/2017 11:13:48 PM       Radiology Dg Chest 2 View  Result Date: 07/28/2017 CLINICAL DATA:   Chest and abdominal pain. Increasing shortness of breath. Nausea and left-sided chest pain. EXAM: CHEST  2 VIEW COMPARISON:  None. FINDINGS: Large cardiac silhouette. Moderate to large retrocardiac hiatal hernia. Tortuosity and atherosclerosis of the thoracic aorta. There is diffuse interstitial coarsening. No confluent airspace disease or pleural effusion. No pneumothorax. Bones diffusely under mineralized. Remote left rib fractures. Bony under mineralization limits assessment of the spine. IMPRESSION: 1. Cardiomegaly with tortuous atherosclerotic thoracic aorta. Interstitial coarsening is likely chronic, however no prior exams available for comparison. Mild interstitial edema could have a similar appearance. 2. Moderate to large retrocardiac hiatal hernia. Electronically Signed   By: Rubye Oaks M.D.   On: 07/28/2017 22:15    Procedures Procedures (including critical care time)  Medications Ordered in  ED Medications - No data to display   Initial Impression / Assessment and Plan / ED Course  I have reviewed the triage vital signs and the nursing notes.  Pertinent labs & imaging results that were available during my care of the patient were reviewed by me and considered in my medical decision making (see chart for details).     BP 101/70 (BP Location: Right Arm)   Pulse 94   Temp 98.4 F (36.9 C) (Oral)   Resp 16   Ht 5' 2.5" (1.588 m)   SpO2 95%   BMI 19.58 kg/m    Final Clinical Impressions(s) / ED Diagnoses   Final diagnoses:  Precordial chest pain  Hiatus hernia syndrome    ED Discharge Orders    None     10:32 PM Patient here with complaints of postprandial chest pain and upper abdominal pain.  Pain is since resolved.  She is well-appearing.  No abdominal bruit concerning for AAA.  She has intact distal pulses, no suspicion for dissection.  Workup initiated.  11:24 PM CXR showing large hiatal hernia, suspect CP is likely related to her hernia.  No active pain,  nausea or vomiting concerning for volvulus. EKG without concerning ischemic changes, initial trop negative, labs are reassuring.  CBG 222 with normal anion gap.  Pt resting comfortably.  Will obtain delta trop.  Care discussed with Dr. Patria Mane  11:43 PM Pt is currently sxs free and resting comfortably.  She is stable for discharge.  Will give referral to Vascular and Vein specialist and [facility] as per request.  Return precaution given.     Fayrene Helper, PA-C 07/28/17 2344    Azalia Bilis, MD 07/29/17 [phone]

## 2017-07-28 NOTE — Telephone Encounter (Signed)
Spoke with patient daughter and she stated that she already spoke with Bailey Mech regarding INR machine. Nothing further needed.

## 2017-07-28 NOTE — ED Notes (Signed)
PA at bedside.

## 2017-07-28 NOTE — ED Triage Notes (Signed)
Per [facility]: Patient to ED from home c/o sudden onset central CP radiating to L arm just after eating x 1 hour accompanied by nausea without vomiting. Pt began to c/o generalized abd pain en route. 18g. LAC, given 4mg  IV Zofran and 324 ASA, with relief, PTA. Patient hx A-Fib, severe kyphosis (ambulates with walker), and abdominal hernia. EMS VS: HR 88 A-Fib, 144/78, 97% RA, CBG 154. Pt A&O x 4. Resp e/u, skin warm/dry.

## 2017-07-28 NOTE — Discharge Instructions (Signed)
Please follow up with your primary care provider for further management of your health.  Follow up with [facility] and vascular and vein specialist as needed.  Return if you have any concerns.

## 2017-07-28 NOTE — ED Notes (Signed)
Patient in hallway returned from X [facility].  Awaiting next room available to place patient in

## 2017-07-31 ENCOUNTER — Encounter: Payer: Self-pay | Admitting: Family Medicine

## 2017-08-04 LAB — POCT INR: INR: 2.4

## 2017-08-06 ENCOUNTER — Ambulatory Visit (INDEPENDENT_AMBULATORY_CARE_PROVIDER_SITE_OTHER): Payer: Medicare Other | Admitting: General Practice

## 2017-08-06 DIAGNOSIS — I482 Chronic atrial fibrillation, unspecified: Secondary | ICD-10-CM

## 2017-08-06 DIAGNOSIS — Z7901 Long term (current) use of anticoagulants: Secondary | ICD-10-CM | POA: Diagnosis not present

## 2017-08-06 NOTE — Patient Instructions (Signed)
Spoke with patient's daughter today.  Continue to take 5 mg daily.  MD INR will be the home monitoring service.  Patient's daughter notified that the results must be faxed to Bailey Mech, RN.  INR will be re-checked in 2 weeks.

## 2017-08-07 NOTE — Progress Notes (Signed)
I have reviewed and agree with note, evaluation, plan.   Jade Burright, MD  

## 2017-08-13 ENCOUNTER — Ambulatory Visit (INDEPENDENT_AMBULATORY_CARE_PROVIDER_SITE_OTHER): Payer: Medicare Other | Admitting: General Practice

## 2017-08-13 ENCOUNTER — Telehealth: Payer: Self-pay | Admitting: Family Medicine

## 2017-08-13 DIAGNOSIS — I482 Chronic atrial fibrillation, unspecified: Secondary | ICD-10-CM

## 2017-08-13 DIAGNOSIS — Z7901 Long term (current) use of anticoagulants: Secondary | ICD-10-CM | POA: Diagnosis not present

## 2017-08-13 LAB — POCT INR: INR: 2.4

## 2017-08-13 NOTE — Patient Instructions (Addendum)
Pre visit review using our clinic review tool, if applicable. No additional management support is needed unless otherwise documented below in the visit note.  Continue to take 5 mg daily.  MD INR will be the home monitoring service.  Patient's daughter notified that the results must be faxed to Bailey Mech, RN.  INR will be re-checked in 1 weeks.

## 2017-08-13 NOTE — Telephone Encounter (Signed)
Copied from CRM [id number]. Topic: Quick Communication - See Telephone Encounter >> Aug 13, 2017 10:13 AM Trula Slade wrote: CRM for notification. See Telephone encounter for:  08/13/17. Patient would like the following prescription refilled: Zoloft (Sertraline) 50mg .  Please send to mail order [facility]

## 2017-08-13 NOTE — Telephone Encounter (Signed)
Rx   Refill   For  Zoloft      LOV   07/11/2017    Last  Refill   -   Historical  Provider

## 2017-08-14 NOTE — Telephone Encounter (Signed)
Patient would like a refill for Sertraline. Would you like for me to send in refill? Please advise. Thank you.

## 2017-08-15 NOTE — Telephone Encounter (Signed)
Ok to refill Sertraline.

## 2017-08-18 ENCOUNTER — Other Ambulatory Visit: Payer: Self-pay | Admitting: Emergency Medicine

## 2017-08-18 MED ORDER — SERTRALINE HCL 50 MG PO TABS: 50.0000 mg | ORAL_TABLET | Freq: Every day | ORAL | 0 refills | Status: DC

## 2017-08-18 NOTE — Telephone Encounter (Signed)
Refill has been sent to patient pharmacy. Nothing further needed.  

## 2017-08-22 ENCOUNTER — Ambulatory Visit (INDEPENDENT_AMBULATORY_CARE_PROVIDER_SITE_OTHER): Payer: Medicare Other | Admitting: General Practice

## 2017-08-22 DIAGNOSIS — Z7901 Long term (current) use of anticoagulants: Secondary | ICD-10-CM

## 2017-08-22 DIAGNOSIS — I482 Chronic atrial fibrillation, unspecified: Secondary | ICD-10-CM

## 2017-08-22 LAB — POCT INR: INR: 3

## 2017-08-22 NOTE — Patient Instructions (Addendum)
Pre visit review using our clinic review tool, if applicable. No additional management support is needed unless otherwise documented below in the visit note.  Continue to take 5 mg daily.  MD INR will be the home monitoring service.  Patient's daughter notified that the results must be faxed to Bailey Mech, RN.  INR will be re-checked in 1 weeks (When you receive strips) Pt is in office today, 1/11).

## 2017-08-26 ENCOUNTER — Telehealth: Payer: Self-pay | Admitting: Family Medicine

## 2017-08-26 NOTE — Telephone Encounter (Signed)
Ok to refill tramadol 50 mg.  Take 1 pill q 6 hrs prn.  Number 120 pills.  Refills 5.

## 2017-08-26 NOTE — Telephone Encounter (Signed)
Copied from CRM [id number]. Topic: General - Other >> Aug 26, 2017 11:16 AM Cecelia Byars, RMA wrote: Reason for CRM: Medication refill request for Tramadol 50 mg to be sent to [facility]

## 2017-08-26 NOTE — Telephone Encounter (Signed)
Please advise if okay to refill prescription. Thank you

## 2017-08-27 ENCOUNTER — Other Ambulatory Visit: Payer: Self-pay | Admitting: Emergency Medicine

## 2017-08-27 ENCOUNTER — Ambulatory Visit: Payer: Self-pay | Admitting: Interventional Cardiology

## 2017-08-27 MED ORDER — TRAMADOL HCL 50 MG PO TABS: 50.0000 mg | ORAL_TABLET | Freq: Four times a day (QID) | ORAL | 5 refills | Status: DC | PRN

## 2017-08-27 NOTE — Telephone Encounter (Signed)
Prescription has been successfully faxed to patients pharmacy. Nothing further needed.

## 2017-09-03 ENCOUNTER — Ambulatory Visit (INDEPENDENT_AMBULATORY_CARE_PROVIDER_SITE_OTHER): Payer: Medicare Other | Admitting: General Practice

## 2017-09-03 DIAGNOSIS — I482 Chronic atrial fibrillation, unspecified: Secondary | ICD-10-CM

## 2017-09-03 DIAGNOSIS — Z7901 Long term (current) use of anticoagulants: Secondary | ICD-10-CM

## 2017-09-03 LAB — POCT INR: INR: 3.4

## 2017-09-03 NOTE — Patient Instructions (Addendum)
Pre visit review using our clinic review tool, if applicable. No additional management support is needed unless otherwise documented below in the visit note.  Skip dose today and then continue to take 5 mg daily.  MD INR will be the home monitoring service.  Patient's daughter notified that the results must be faxed to Bailey Mech, RN.  [name] (dtr) [phone]

## 2017-09-04 ENCOUNTER — Telehealth: Payer: Self-pay | Admitting: Family Medicine

## 2017-09-04 NOTE — Telephone Encounter (Signed)
Copied from CRM [id number]. Topic: Quick Communication - Rx Refill/Question >> Sep 04, 2017  4:03 PM Viviann Spare wrote: Medication: sertraline (ZOLOFT) 50 MG tablet (RX SEND TO [facility] ON 08/18/17, BUT PATIENT STATED THAT'S THE WRONG PHARMACY AND WOULD LIKE FOR IT TO GO TO THE PHARMACY BELOW.)   Has the patient contacted their pharmacy? Yes.     (Agent: If no, request that the patient contact the pharmacy for the refill.)   Preferred Pharmacy (with phone number or street name):  [facility] - [place], [place] - [address] [place] [address] Phone: [phone] Fax: [phone]     Agent: Please be advised that RX refills may take up to 3 business days. We ask that you follow-up with your pharmacy.

## 2017-09-04 NOTE — Telephone Encounter (Signed)
LOV 07/11/17 with Dr. Salomon Fick / Refill request for Zoloft to be sent to [facility] /

## 2017-09-05 ENCOUNTER — Other Ambulatory Visit: Payer: Self-pay | Admitting: Emergency Medicine

## 2017-09-05 MED ORDER — SERTRALINE HCL 50 MG PO TABS: 50.0000 mg | ORAL_TABLET | Freq: Every day | ORAL | 0 refills | Status: DC

## 2017-09-05 NOTE — Telephone Encounter (Signed)
Medication has been sent to patient pharmacy [facility]

## 2017-09-08 ENCOUNTER — Other Ambulatory Visit: Payer: Self-pay | Admitting: Emergency Medicine

## 2017-09-08 ENCOUNTER — Telehealth: Payer: Self-pay | Admitting: Family Medicine

## 2017-09-08 MED ORDER — TRAMADOL HCL 50 MG PO TABS: 50.0000 mg | ORAL_TABLET | Freq: Four times a day (QID) | ORAL | 1 refills | Status: DC | PRN

## 2017-09-08 NOTE — Telephone Encounter (Signed)
Needs new 90 day supply sent in per her pharmacy, according to patient.

## 2017-09-08 NOTE — Telephone Encounter (Signed)
Copied from CRM [id number]. Topic: Inquiry >> Sep 08, 2017  9:28 AM Stacey [name] wrote: Reason for CRM: Patient called stating that a 30 Day supply of traMADol (ULTRAM) 50 MG tablet was sent to her mail order pharmacy, but she needs the 90 Day supply sent. Patient's Preferred Pharmacy is [facility] - [place], [place] - [address] [phone] (Phone)    [phone] (Fax).

## 2017-09-08 NOTE — Telephone Encounter (Signed)
A new prescription has been sent to patient pharmacy for a 90 day supply. Nothing further needed.

## 2017-09-09 NOTE — Telephone Encounter (Signed)
Pt calling and states the tramadol instructions was sent in incorrectly. She states she normally takes 2 tablets every 6 hours, but the rx was wrote for 1 tablet every 6 hours and she would like to see if this can be corrected?

## 2017-09-09 NOTE — Telephone Encounter (Signed)
Spoke with patient regarding prescription and per Dr. Salomon Fick she would like patient to continue taking 1 tablet every 6 hours. Patient states that she normally does 2 tablets every 6 hours but will try the 1 tablet and if it doesn't work she will give the office a call back. Nothing further needed.

## 2017-09-11 NOTE — Progress Notes (Signed)
INR reviewed.  

## 2017-09-12 ENCOUNTER — Encounter: Payer: Self-pay | Admitting: Interventional Cardiology

## 2017-09-12 ENCOUNTER — Ambulatory Visit (INDEPENDENT_AMBULATORY_CARE_PROVIDER_SITE_OTHER): Payer: Medicare Other | Admitting: Interventional Cardiology

## 2017-09-12 VITALS — BP 110/78 | HR 99 | Ht 63.0 in | Wt 110.0 lb

## 2017-09-12 DIAGNOSIS — Z7901 Long term (current) use of anticoagulants: Secondary | ICD-10-CM | POA: Diagnosis not present

## 2017-09-12 DIAGNOSIS — I482 Chronic atrial fibrillation, unspecified: Secondary | ICD-10-CM

## 2017-09-12 DIAGNOSIS — I509 Heart failure, unspecified: Secondary | ICD-10-CM | POA: Diagnosis not present

## 2017-09-12 NOTE — Progress Notes (Signed)
Cardiology Office Note    Date:  09/12/2017   ID:  Stacey [name], Stacey [name] 1932-08-29, MRN [id number]  PCP:  [name], MD  Cardiologist: Lesleigh Noe, MD   Chief Complaint  Patient presents with  . Atrial Fibrillation  . Congestive Heart Failure  `  History of Present Illness:  Stacey [name] is a 84 y.o. female referred by Dr. Abbe Amsterdam for evaluation and management of chronic atrial fibrillation and "congestive heart failure".  We have no records available concerning her heart history.  She was previously seen by Dr. Eula Flax.  Has had atrial fibrillation for greater than 10 years.  This is been managed by him.  5-year history of congestive heart failure but no details concerning systolic or diastolic diagnosis.  She has chronic shortness of breath.  She feels more short of breath with activity now than she did a year ago.  States that her prior doctor told her to sleep elevated on pillows.  She has moved to [facility] to be near her daughter because of failing health heart issues.  Has history of lymphedema.  She is in Coumadin clinic at the office of Dr. Abbe Amsterdam.  Bailey Mech is a Surveyor, quantity.D. in charge.  GI bleed on Xarelto requiring transfusion at which time she was switched to Coumadin.  Past Medical History:  Diagnosis Date  . Atrial fibrillation (HCC)   . Hernia of abdominal cavity   . Kyphosis     Past Surgical History:  Procedure Laterality Date  . ABDOMINAL HYSTERECTOMY    . TONSILLECTOMY      Current Medications: Outpatient Medications Prior to Visit  Medication Sig Dispense Refill  . atorvastatin (LIPITOR) 10 MG tablet Take 10 mg by mouth daily.    . cyanocobalamin 1000 MCG tablet Take 1,000 mcg by mouth daily.    Marland Kitchen desloratadine (CLARINEX) 5 MG tablet Take 5 mg by mouth daily.    . furosemide (LASIX) 40 MG tablet Take 40 mg by mouth.    . gabapentin (NEURONTIN) 400 MG capsule Take 400 mg by mouth 3 (three) times daily.    .  metoprolol tartrate (LOPRESSOR) 100 MG tablet Take 100 mg by mouth 2 (two) times daily.    . Multiple Vitamin (MULTIVITAMIN) capsule Take 1 capsule by mouth daily.    Marland Kitchen omeprazole (PRILOSEC) 20 MG capsule Take 20 mg by mouth daily.    . prednisoLONE 5 MG TABS tablet Take 5 mg by mouth daily.     . sertraline (ZOLOFT) 50 MG tablet Take 1 tablet (50 mg total) by mouth daily. 90 tablet 0  . torsemide (DEMADEX) 100 MG tablet Take 100 mg by mouth daily.    . traMADol (ULTRAM) 50 MG tablet Take 50 mg by mouth every 6 (six) hours as needed for moderate pain.    Marland Kitchen VITAMIN D, CHOLECALCIFEROL, PO Take 1,000 Units by mouth daily.     . traMADol (ULTRAM) 50 MG tablet Take 1 tablet (50 mg total) by mouth every 6 (six) hours as needed. (Patient taking differently: Take 50 mg by mouth every 6 (six) hours as needed for moderate pain. ) 360 tablet 1  . Calcium-Magnesium-Vitamin D (CALCIUM 500 PO) Take by mouth.     No facility-administered medications prior to visit.      Allergies:   Sulfa antibiotics; Codeine; Latex; Neosporin original [bacitracin-neomycin-polymyxin]; and Polysporin [bacitracin-polymyxin b]   Social History   Socioeconomic History  . Marital status: Widowed  Spouse name: None  . Number of children: None  . Years of education: None  . Highest education level: None  Social Needs  . Financial resource strain: None  . Food insecurity - worry: None  . Food insecurity - inability: None  . Transportation needs - medical: None  . Transportation needs - non-medical: None  Occupational History  . None  Tobacco Use  . Smoking status: Former Games developer  . Smokeless tobacco: Never Used  Substance and Sexual Activity  . Alcohol use: No    Frequency: Never  . Drug use: No  . Sexual activity: None  Other Topics Concern  . None  Social History Narrative  . None     Family History:  The patient's family history is not on file.   ROS:   Please see the history of present illness.      Weakness, frailty, significant hiatal hernia, recurring chest pain, no history of coronary disease, no history of CAD, history of lymphedema right leg greater than left.  He bled on Xarelto in 2015 at which time she was switched to Coumadin. All other systems reviewed and are negative.   PHYSICAL EXAM:   VS:  BP 110/78   Pulse 99   Ht 5\' 3"  (1.6 m)   Wt 110 lb (49.9 kg)   SpO2 95%   BMI 19.49 kg/m    GEN: Kyphoscoliosis noticeable while sitting in a wheelchair and in no acute distress.  Very frail. HEENT: normal  Neck:, carotid bruits, or masses.  Neck veins are flat with the patient sitting with the exception of right jugular V-wave. Cardiac: IIRR; no murmurs, rubs, or gallops,no edema  Respiratory:  clear to auscultation bilaterally, normal work of breathing GI: soft, nontender, nondistended, + BS MS: no deformity or atrophy.  Kyphoscoliosis. Skin: warm and dry, no rash Neuro:  Alert and Oriented x 3, Strength and sensation are intact Psych: euthymic mood, full affect  Wt Readings from Last 3 Encounters:  09/12/17 110 lb (49.9 kg)  07/11/17 108 lb 12.8 oz (49.4 kg)      Studies/Labs Reviewed:   EKG:  EKG tracing from ER December 17 reveals atrial fibrillation, moderate rate control, incomplete right bundle, left axis deviation, nonspecific ST-T wave abnormality.  Recent Labs: 07/28/2017: ALT 18; BUN 14; Creatinine, Ser 0.91; Hemoglobin 12.0; Platelets 226; Potassium 3.7; Sodium 133   Lipid Panel No results found for: CHOL, TRIG, HDL, CHOLHDL, VLDL, LDLCALC, LDLDIRECT  Additional studies/ records that were reviewed today include:  PA and lateral chest x-ray 07/28/17: IMPRESSION: 1. Cardiomegaly with tortuous atherosclerotic thoracic aorta. Interstitial coarsening is likely chronic, however no prior exams available for comparison. Mild interstitial edema could have a similar appearance. 2. Moderate to large retrocardiac hiatal hernia.     ASSESSMENT:    1.  Chronic atrial fibrillation (HCC)   2. Congestive heart failure, unspecified HF chronicity, unspecified heart failure type (HCC)   3. Long term (current) use of anticoagulants [Z79.01]      PLAN:  In order of problems listed above:  1. Adequate rate control on high-dose metoprolol 100 mg twice daily (tartrate).  No change. 2. No real information concerning heart failure.  Suspect diastolic heart failure.  No clinical evidence of volume overload.  No change in therapy. 3. Continue follow-up in Coumadin clinic at Dr. 07/30/17 office.  We will try to obtain cardiac information from Dr. Salomon Fick.  I will see her back in 6 months.    Medication Adjustments/Labs and Tests Ordered:  Current medicines are reviewed at length with the patient today.  Concerns regarding medicines are outlined above.  Medication changes, Labs and Tests ordered today are listed in the Patient Instructions below. Patient Instructions  Medication Instructions:  Your physician recommends that you continue on your current medications as directed. Please refer to the Current Medication list given to you today.  Labwork: None  Testing/Procedures: None  Follow-Up: Your physician wants you to follow-up in: 6 months with Dr. Katrinka Blazing. You will receive a reminder letter in the mail two months in advance. If you don't receive a letter, please call our office to schedule the follow-up appointment.   Any Other Special Instructions Will Be Listed Below (If Applicable).     If you need a refill on your cardiac medications before your next appointment, please call your pharmacy.      Signed, Lesleigh Noe, MD  09/12/2017 2:31 PM    [facility] [address] [place], [place], [place]  [address] Phone: [phone]; Fax: [phone]

## 2017-09-12 NOTE — Patient Instructions (Signed)

## 2017-09-15 ENCOUNTER — Telehealth: Payer: Self-pay | Admitting: Interventional Cardiology

## 2017-09-15 NOTE — Telephone Encounter (Signed)
Records received from Dr.Richard Daw. [facility].  Gave to Chart Prep.

## 2017-09-17 ENCOUNTER — Ambulatory Visit (INDEPENDENT_AMBULATORY_CARE_PROVIDER_SITE_OTHER): Payer: Medicare Other | Admitting: General Practice

## 2017-09-17 DIAGNOSIS — I482 Chronic atrial fibrillation, unspecified: Secondary | ICD-10-CM

## 2017-09-17 DIAGNOSIS — Z7901 Long term (current) use of anticoagulants: Secondary | ICD-10-CM

## 2017-09-17 LAB — POCT INR: INR: 3.3

## 2017-09-17 NOTE — Progress Notes (Signed)
I have reviewed and agree with note, evaluation, plan.   Myrtie Leuthold, MD  

## 2017-09-17 NOTE — Patient Instructions (Signed)
Skip dose today and then take 5 mg daily except 2.5 mg on Wednesdays.    MD INR will be the home monitoring service.  Patient's daughter notified that the results must be faxed to Bailey Mech, RN.  [name] (dtr) [phone]

## 2017-09-23 DIAGNOSIS — I482 Chronic atrial fibrillation: Secondary | ICD-10-CM | POA: Diagnosis not present

## 2017-09-23 DIAGNOSIS — Z7901 Long term (current) use of anticoagulants: Secondary | ICD-10-CM | POA: Diagnosis not present

## 2017-09-24 ENCOUNTER — Ambulatory Visit (INDEPENDENT_AMBULATORY_CARE_PROVIDER_SITE_OTHER): Payer: Medicare Other | Admitting: General Practice

## 2017-09-24 DIAGNOSIS — Z7901 Long term (current) use of anticoagulants: Secondary | ICD-10-CM

## 2017-09-24 DIAGNOSIS — I482 Chronic atrial fibrillation, unspecified: Secondary | ICD-10-CM

## 2017-09-24 LAB — POCT INR: INR: 2.8

## 2017-09-24 NOTE — Patient Instructions (Signed)
Continue to take 5 mg daily except 2.5 mg on Wednesdays.    MD INR will be the home monitoring service.  Patient's daughter notified that the results must be faxed to Bailey Mech, RN.  [name] (dtr) [phone].  Re-check in 2 weeks.

## 2017-09-24 NOTE — Progress Notes (Signed)
I have reviewed and agree with note, evaluation, plan.   Ameena Vesey, MD  

## 2017-09-25 ENCOUNTER — Telehealth: Payer: Self-pay | Admitting: Family Medicine

## 2017-09-25 NOTE — Telephone Encounter (Signed)
Copied from CRM [id number]. Topic: Quick Communication - Rx Refill/Question >> Sep 25, 2017  1:01 PM Gerrianne Scale wrote: Medication: desloratadine (CLARINEX) 5 MG tablet and Zantac    Has the patient contacted their pharmacy? No.  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 [phone] (Phone) [phone] (Fax)     Agent: Please be advised that RX refills may take up to 3 business days. We ask that you follow-up with your pharmacy.

## 2017-09-29 NOTE — Telephone Encounter (Signed)
Patient is requesting a refill for Clarniex and Zantac. Clarinex is on patient medication list but Zantac is not. Would you like for me to send in both medications? Also patient has been scheduled for a CPE tomorrow.

## 2017-09-30 ENCOUNTER — Ambulatory Visit (INDEPENDENT_AMBULATORY_CARE_PROVIDER_SITE_OTHER): Payer: Medicare Other | Admitting: Family Medicine

## 2017-09-30 ENCOUNTER — Encounter: Payer: Self-pay | Admitting: Family Medicine

## 2017-09-30 VITALS — BP 126/62 | Temp 97.4°F | Ht 63.0 in | Wt 114.9 lb

## 2017-09-30 DIAGNOSIS — I482 Chronic atrial fibrillation, unspecified: Secondary | ICD-10-CM

## 2017-09-30 DIAGNOSIS — H6123 Impacted cerumen, bilateral: Secondary | ICD-10-CM

## 2017-09-30 DIAGNOSIS — Z0000 Encounter for general adult medical examination without abnormal findings: Secondary | ICD-10-CM | POA: Diagnosis not present

## 2017-09-30 DIAGNOSIS — F419 Anxiety disorder, unspecified: Secondary | ICD-10-CM

## 2017-09-30 DIAGNOSIS — R42 Dizziness and giddiness: Secondary | ICD-10-CM

## 2017-09-30 DIAGNOSIS — F329 Major depressive disorder, single episode, unspecified: Secondary | ICD-10-CM | POA: Diagnosis not present

## 2017-09-30 DIAGNOSIS — F32A Depression, unspecified: Secondary | ICD-10-CM

## 2017-09-30 LAB — POCT INR

## 2017-09-30 MED ORDER — MECLIZINE HCL 12.5 MG PO TABS: 12.5000 mg | ORAL_TABLET | Freq: Three times a day (TID) | ORAL | 0 refills | Status: DC | PRN

## 2017-09-30 MED ORDER — SERTRALINE HCL 100 MG PO TABS: 100.0000 mg | ORAL_TABLET | Freq: Every day | ORAL | 3 refills | Status: DC

## 2017-09-30 NOTE — Patient Instructions (Addendum)
Please contact your INR company as the office has yet to receive any results from today's home INR.  Continue current dosing with 2.5 mg on Wednesday and 5 mg on all other days.  If INR continues to be mildly elevated will make necessary adjustments.  You can obtain Debrox ear drops from the drug store.  These help loosen up any built up wax that may be in your ear. Dizziness Dizziness is a common problem. It is a feeling of unsteadiness or light-headedness. You may feel like you are about to faint. Dizziness can lead to injury if you stumble or fall. Anyone can become dizzy, but dizziness is more common in older adults. This condition can be caused by a number of things, including medicines, dehydration, or illness. Follow these instructions at home: Eating and drinking  Drink enough fluid to keep your urine clear or pale yellow. This helps to keep you from becoming dehydrated. Try to drink more clear fluids, such as water.  Do not drink alcohol.  Limit your caffeine intake if told to do so by your health care provider. Check ingredients and nutrition facts to see if a food or beverage contains caffeine.  Limit your salt (sodium) intake if told to do so by your health care provider. Check ingredients and nutrition facts to see if a food or beverage contains sodium. Activity  Avoid making quick movements. ? Rise slowly from chairs and steady yourself until you feel okay. ? In the morning, first sit up on the side of the bed. When you feel okay, stand slowly while you hold onto something until you know that your balance is fine.  If you need to stand in one place for a long time, move your legs often. Tighten and relax the muscles in your legs while you are standing.  Do not drive or use heavy machinery if you feel dizzy.  Avoid bending down if you feel dizzy. Place items in your home so that they are easy for you to reach without leaning over. Lifestyle  Do not use any products that  contain nicotine or tobacco, such as cigarettes and e-cigarettes. If you need help quitting, ask your health care provider.  Try to reduce your stress level by using methods such as yoga or meditation. Talk with your health care provider if you need help to manage your stress. General instructions  Watch your dizziness for any changes.  Take over-the-counter and prescription medicines only as told by your health care provider. Talk with your health care provider if you think that your dizziness is caused by a medicine that you are taking.  Tell a friend or a family member that you are feeling dizzy. If he or she notices any changes in your behavior, have this person call your health care provider.  Keep all follow-up visits as told by your health care provider. This is important. Contact a health care provider if:  Your dizziness does not go away.  Your dizziness or light-headedness gets worse.  You feel nauseous.  You have reduced hearing.  You have new symptoms.  You are unsteady on your feet or you feel like the room is spinning. Get help right away if:  You vomit or have diarrhea and are unable to eat or drink anything.  You have problems talking, walking, swallowing, or using your arms, hands, or legs.  You feel generally weak.  You are not thinking clearly or you have trouble forming sentences. It may take a friend  or family member to notice this.  You have chest pain, abdominal pain, shortness of breath, or sweating.  Your vision changes.  You have any bleeding.  You have a severe headache.  You have neck pain or a stiff neck.  You have a fever. These symptoms may represent a serious problem that is an emergency. Do not wait to see if the symptoms will go away. Get medical help right away. Call your local emergency services (911 in the [place] Do not drive yourself to the hospital. Summary  Dizziness is a feeling of unsteadiness or light-headedness. This  condition can be caused by a number of things, including medicines, dehydration, or illness.  Anyone can become dizzy, but dizziness is more common in older adults.  Drink enough fluid to keep your urine clear or pale yellow. Do not drink alcohol.  Avoid making quick movements if you feel dizzy. Monitor your dizziness for any changes. This information is not intended to replace advice given to you by your health care provider. Make sure you discuss any questions you have with your health care provider. Document Released: 01/22/2001 Document Revised: 08/31/2016 Document Reviewed: 08/31/2016 Elsevier Interactive Patient Education  2018 [place], Adult Your doctor has found that you have a condition that requires you to use ear drops. Ear drops are a medicine that is placed in the ear. This sheet gives you information about how to use this medicine. Your doctor may also give you more specific instructions. Supplies needed:  Cotton ball.  Medicine. How to put ear drops into your ear 1. Wash your hands with soap and water. 2. Make sure your ears are clean and dry. 3. Warm the medicine by holding it in your hand for a few minutes. 4. Shake the medicine to mix the ear drops. 5. Use the tube to get the medicine. You will need to squeeze the round part of the tube to do this. 6. Put the drops in your ear as told. Hold the tube above your ear. Do not let the tube touch your ear. The medicine may go in easier if you pull the flap of your ear up and back while you put the drops in. 7. To make sure your ear soaks up the medicine, do one of these things: ? Lie down for 10 minutes. The ear with the medicine should face up. ? Put a cotton ball in your ear. Do not push it deeper into your ear. Take out the cotton ball when the drops have been soaked up. 8. If you need to put drops in your other ear, repeat the same steps. Your doctor will tell you if you should put drops in both  ears. Follow these instructions at home:  Use the ear drops for as long as your doctor tells you to. Do not stop even if your symptoms get better.  Keep the ear drops at room temperature.  Keep all follow-up visits as told by your doctor. This is important. Contact a doctor if:  Your condition gets worse.  Your pain gets worse.  Unusual fluid (drainage) is coming from your ear, especially if the fluid stinks.  You have trouble hearing. Get help right away if:  You feel like the room is spinning and you feel sick to your stomach. This condition is called vertigo.  The outside of your ear becomes red or swollen.  You have a very bad headache. Summary  Ear drops are a medicine that is put  in the ear.  Put the drops in your ear as told by your doctor.  Use the ear drops for as long as your doctor tells you to. Do not stop even if your symptoms get better. This information is not intended to replace advice given to you by your health care provider. Make sure you discuss any questions you have with your health care provider. Document Released: 01/16/2010 Document Revised: 08/02/2016 Document Reviewed: 08/02/2016 Elsevier Interactive Patient Education  2017 [place] Maintenance for Postmenopausal Women Menopause is a normal process in which your reproductive ability comes to an end. This process happens gradually over a span of months to years, usually between the ages of 47 and 23. Menopause is complete when you have missed 12 consecutive menstrual periods. It is important to talk with your health care provider about some of the most common conditions that affect postmenopausal women, such as heart disease, cancer, and bone loss (osteoporosis). Adopting a healthy lifestyle and getting preventive care can help to promote your health and wellness. Those actions can also lower your chances of developing some of these common conditions. What should I know about  menopause? During menopause, you may experience a number of symptoms, such as:  Moderate-to-severe hot flashes.  Night sweats.  Decrease in sex drive.  Mood swings.  Headaches.  Tiredness.  Irritability.  Memory problems.  Insomnia.  Choosing to treat or not to treat menopausal changes is an individual decision that you make with your health care provider. What should I know about hormone replacement therapy and supplements? Hormone therapy products are effective for treating symptoms that are associated with menopause, such as hot flashes and night sweats. Hormone replacement carries certain risks, especially as you become older. If you are thinking about using estrogen or estrogen with progestin treatments, discuss the benefits and risks with your health care provider. What should I know about heart disease and stroke? Heart disease, heart attack, and stroke become more likely as you age. This may be due, in part, to the hormonal changes that your body experiences during menopause. These can affect how your body processes dietary fats, triglycerides, and cholesterol. Heart attack and stroke are both medical emergencies. There are many things that you can do to help prevent heart disease and stroke:  Have your blood pressure checked at least every 1-2 years. High blood pressure causes heart disease and increases the risk of stroke.  If you are 65-77 years old, ask your health care provider if you should take aspirin to prevent a heart attack or a stroke.  Do not use any tobacco products, including cigarettes, chewing tobacco, or electronic cigarettes. If you need help quitting, ask your health care provider.  It is important to eat a healthy diet and maintain a healthy weight. ? Be sure to include plenty of vegetables, fruits, low-fat dairy products, and lean protein. ? Avoid eating foods that are high in solid fats, added sugars, or salt (sodium).  Get regular exercise. This  is one of the most important things that you can do for your health. ? Try to exercise for at least 150 minutes each week. The type of exercise that you do should increase your heart rate and make you sweat. This is known as moderate-intensity exercise. ? Try to do strengthening exercises at least twice each week. Do these in addition to the moderate-intensity exercise.  Know your numbers.Ask your health care provider to check your cholesterol and your blood glucose. Continue to  have your blood tested as directed by your health care provider.  What should I know about cancer screening? There are several types of cancer. Take the following steps to reduce your risk and to catch any cancer development as early as possible. Breast Cancer  Practice breast self-awareness. ? This means understanding how your breasts normally appear and feel. ? It also means doing regular breast self-exams. Let your health care provider know about any changes, no matter how small.  If you are 41 or older, have a clinician do a breast exam (clinical breast exam or CBE) every year. Depending on your age, family history, and medical history, it may be recommended that you also have a yearly breast X-ray (mammogram).  If you have a family history of breast cancer, talk with your health care provider about genetic screening.  If you are at high risk for breast cancer, talk with your health care provider about having an MRI and a mammogram every year.  Breast cancer (BRCA) gene test is recommended for women who have family members with BRCA-related cancers. Results of the assessment will determine the need for genetic counseling and BRCA1 and for BRCA2 testing. BRCA-related cancers include these types: ? Breast. This occurs in males or females. ? Ovarian. ? Tubal. This may also be called fallopian tube cancer. ? Cancer of the abdominal or pelvic lining (peritoneal cancer). ? Prostate. ? Pancreatic.  Cervical,  Uterine, and Ovarian Cancer Your health care provider may recommend that you be screened regularly for cancer of the pelvic organs. These include your ovaries, uterus, and vagina. This screening involves a pelvic exam, which includes checking for microscopic changes to the surface of your cervix (Pap test).  For women ages 21-65, health care providers may recommend a pelvic exam and a Pap test every three years. For women ages 58-65, they may recommend the Pap test and pelvic exam, combined with testing for human papilloma virus (HPV), every five years. Some types of HPV increase your risk of cervical cancer. Testing for HPV may also be done on women of any age who have unclear Pap test results.  Other health care providers may not recommend any screening for nonpregnant women who are considered low risk for pelvic cancer and have no symptoms. Ask your health care provider if a screening pelvic exam is right for you.  If you have had past treatment for cervical cancer or a condition that could lead to cancer, you need Pap tests and screening for cancer for at least 20 years after your treatment. If Pap tests have been discontinued for you, your risk factors (such as having a new sexual partner) need to be reassessed to determine if you should start having screenings again. Some women have medical problems that increase the chance of getting cervical cancer. In these cases, your health care provider may recommend that you have screening and Pap tests more often.  If you have a family history of uterine cancer or ovarian cancer, talk with your health care provider about genetic screening.  If you have vaginal bleeding after reaching menopause, tell your health care provider.  There are currently no reliable tests available to screen for ovarian cancer.  Lung Cancer Lung cancer screening is recommended for adults 32-2 years old who are at high risk for lung cancer because of a history of smoking. A  yearly low-dose CT scan of the lungs is recommended if you:  Currently smoke.  Have a history of at least 30 pack-years  of smoking and you currently smoke or have quit within the past 15 years. A pack-year is smoking an average of one pack of cigarettes per day for one year.  Yearly screening should:  Continue until it has been 15 years since you quit.  Stop if you develop a health problem that would prevent you from having lung cancer treatment.  Colorectal Cancer  This type of cancer can be detected and can often be prevented.  Routine colorectal cancer screening usually begins at age 33 and continues through age 60.  If you have risk factors for colon cancer, your health care provider may recommend that you be screened at an earlier age.  If you have a family history of colorectal cancer, talk with your health care provider about genetic screening.  Your health care provider may also recommend using home test kits to check for hidden blood in your stool.  A small camera at the end of a tube can be used to examine your colon directly (sigmoidoscopy or colonoscopy). This is done to check for the earliest forms of colorectal cancer.  Direct examination of the colon should be repeated every 5-10 years until age 2. However, if early forms of precancerous polyps or small growths are found or if you have a family history or genetic risk for colorectal cancer, you may need to be screened more often.  Skin Cancer  Check your skin from head to toe regularly.  Monitor any moles. Be sure to tell your health care provider: ? About any new moles or changes in moles, especially if there is a change in a mole's shape or color. ? If you have a mole that is larger than the size of a pencil eraser.  If any of your family members has a history of skin cancer, especially at a young age, talk with your health care provider about genetic screening.  Always use sunscreen. Apply sunscreen liberally  and repeatedly throughout the day.  Whenever you are outside, protect yourself by wearing long sleeves, pants, a wide-brimmed hat, and sunglasses.  What should I know about osteoporosis? Osteoporosis is a condition in which bone destruction happens more quickly than new bone creation. After menopause, you may be at an increased risk for osteoporosis. To help prevent osteoporosis or the bone fractures that can happen because of osteoporosis, the following is recommended:  If you are 68-66 years old, get at least 1,000 mg of calcium and at least 600 mg of vitamin D per day.  If you are older than age 65 but younger than age 26, get at least 1,200 mg of calcium and at least 600 mg of vitamin D per day.  If you are older than age 55, get at least 1,200 mg of calcium and at least 800 mg of vitamin D per day.  Smoking and excessive alcohol intake increase the risk of osteoporosis. Eat foods that are rich in calcium and vitamin D, and do weight-bearing exercises several times each week as directed by your health care provider. What should I know about how menopause affects my mental health? Depression may occur at any age, but it is more common as you become older. Common symptoms of depression include:  Low or sad mood.  Changes in sleep patterns.  Changes in appetite or eating patterns.  Feeling an overall lack of motivation or enjoyment of activities that you previously enjoyed.  Frequent crying spells.  Talk with your health care provider if you think that you are  experiencing depression. What should I know about immunizations? It is important that you get and maintain your immunizations. These include:  Tetanus, diphtheria, and pertussis (Tdap) booster vaccine.  Influenza every year before the flu season begins.  Pneumonia vaccine.  Shingles vaccine.  Your health care provider may also recommend other immunizations. This information is not intended to replace advice given to you  by your health care provider. Make sure you discuss any questions you have with your health care provider. Document Released: 09/20/2005 Document Revised: 02/16/2016 Document Reviewed: 05/02/2015 Elsevier Interactive Patient Education  2018 [employer].

## 2017-09-30 NOTE — Progress Notes (Signed)
Subjective:     Stacey [name] is a 84 y.o. female and is here for a comprehensive physical exam. The patient reports problems - recent episodes of dizziness.   Patient endorses dizziness off and on times 1 month.  She endorses an episode this weekend that was worse than her other episodes.  Patient has noted symptoms while sitting in the shower.  Patient denies recent illness.  Patient tries to stay hydrated by drinking several 32 ounce bottles of water per day.  No new medications recently.  Patient's Lasix 40 mg was discontinued.  She is still taking furosemide 100 mg daily.  Patient's episodes of dizziness causing her anxiety to increase.  Patient becomes anxious feeling like she cannot breathe when dizzy.  Patient also is experiencing increasing anxiety surrounding thoughts of death.  Patient is aware of her mortality.  She wonders if 1 of these episodes will be "the end".  Patient currently taking Zoloft 50 mg daily.  She asked that this dose can be increased.  Patient's daughter also ask if the results from her INR today were were received by the office.  Patient does home INR checks.  She recently started doing this with a new company.  Patient currently takes Coumadin 2.5 mg on Wednesday and 5 mg all other days.  Patient's INR has been slightly elevated at 3.1 during her last checks.  Patient's goal is between 2-3.  Social History   Socioeconomic History  . Marital status: Widowed    Spouse name: Not on file  . Number of children: Not on file  . Years of education: Not on file  . Highest education level: Not on file  Social Needs  . Financial resource strain: Not on file  . Food insecurity - worry: Not on file  . Food insecurity - inability: Not on file  . Transportation needs - medical: Not on file  . Transportation needs - non-medical: Not on file  Occupational History  . Not on file  Tobacco Use  . Smoking status: Former Games developer  . Smokeless tobacco: Never Used  Substance  and Sexual Activity  . Alcohol use: No    Frequency: Never  . Drug use: No  . Sexual activity: Not on file  Other Topics Concern  . Not on file  Social History Narrative  . Not on file   Health Maintenance  Topic Date Due  . TETANUS/TDAP  [birth year]/04/1952  . DEXA SCAN  01/05/2000  . PNA vac Low Risk Adult (1 of 2 - PCV13) 01/05/2000  . INFLUENZA VACCINE  03/12/2017    The following portions of the patient's history were reviewed and updated as appropriate: allergies, current medications, past family history, past medical history, past social history, past surgical history and problem list.  Review of Systems A comprehensive review of systems was negative.   See above HPI: +dizziness, H/o osteoarthritis and kyphosis.  Objective:    BP 126/62 (BP Location: Left Arm, Patient Position: Sitting, Cuff Size: Normal)   Temp (!) 97.4 F (36.3 C) (Oral)   Ht 5\' 3"  (1.6 m)   Wt 114 lb 14.4 oz (52.1 kg)   BMI 20.35 kg/m  General appearance: alert, cooperative and no distress Head: Normocephalic, without obvious abnormality, atraumatic Eyes: conjunctivae/corneas clear. PERRL, EOM's intact. No nystagmus.  Pt wears glasses. Ears: normal TM's and external ear canals both ears after irrigation for impacted cerumen. Nose: Nares normal. Septum midline. Mucosa normal. No drainage or sinus tenderness. Throat: lips, mucosa, and tongue  normal; teeth and gums normal Neck: no adenopathy, no carotid bruit, no JVD, supple, symmetrical, trachea midline and thyroid not enlarged, symmetric, no tenderness/mass/nodules Lungs: clear to auscultation bilaterally Heart: regular rate and rhythm, S1, S2 normal, no murmur, click, rub or gallop Abdomen: soft, non-tender; bowel sounds normal; no masses,  no organomegaly Extremities: extremities normal, atraumatic, no cyanosis or edema  MSK: kyphosis and lateral rotation to the right of cervical spine.   Skin: Skin color, texture, turgor normal. No rashes or  lesions Neurologic: Alert and oriented X 3, normal strength and tone. Normal symmetric reflexes. Normal coordination and gait    Assessment:    Healthy female exam.  With recent episodes of dizziness     Plan:     Anticipatory guidance given including wearing seatbelts, smoke detectors in the home, increasing p.o. intake of water, decreasing intake of sodium, avoiding falls in the home. -Handout given on healthcare maintenance See After Visit Summary for Counseling Recommendations   Dizziness -Possibly secondary to impacted cerumen, medications, dehydration, vertigo -Discussed increasing p.o. intake of water -We will obtain labs this visit including CBC, BMP -Will send in Antivert 12.5 mg in case pt has continued dizziness.  Bilateral impacted cerumen -Bilateral irrigation of ears -Suggested Debrox eardrops for home use as needed  Chronic A. fib -On long-term anticoagulation -Continue Coumadin 2.5 mg on Wednesdays and 5 mg on all other days -Patient's daughter encouraged to contact home INR company as no results were received from today's INR  Anxiety and depression -We will increase patient's Zoloft to 100 mg daily -Patient encouraged to consider counseling for increasing anxiety surrounding death -We will follow-up in a few months to further discuss  Next CPE in 1 year.  Follow-up PRN  Abbe Amsterdam, MD

## 2017-09-30 NOTE — Telephone Encounter (Signed)
Pt is currently on Prilosec.  She did not mention anything about refills at this visit.  Ok to refill Clarinex.  Hold off on the Zantac. Thanks.

## 2017-10-01 ENCOUNTER — Ambulatory Visit (INDEPENDENT_AMBULATORY_CARE_PROVIDER_SITE_OTHER): Payer: Medicare Other | Admitting: General Practice

## 2017-10-01 DIAGNOSIS — Z7901 Long term (current) use of anticoagulants: Secondary | ICD-10-CM | POA: Diagnosis not present

## 2017-10-01 DIAGNOSIS — I482 Chronic atrial fibrillation, unspecified: Secondary | ICD-10-CM

## 2017-10-01 LAB — CBC WITH DIFFERENTIAL/PLATELET
Basophils Absolute: 0.1 10*3/uL (ref 0.0–0.1)
Basophils Relative: 0.9 % (ref 0.0–3.0)
EOS ABS: 0.1 10*3/uL (ref 0.0–0.7)
Eosinophils Relative: 1.3 % (ref 0.0–5.0)
HEMATOCRIT: 38.1 % (ref 36.0–46.0)
Hemoglobin: 12.8 g/dL (ref 12.0–15.0)
LYMPHS PCT: 10.4 % — AB (ref 12.0–46.0)
Lymphs Abs: 0.6 10*3/uL — ABNORMAL LOW (ref 0.7–4.0)
MCHC: 33.6 g/dL (ref 30.0–36.0)
MCV: 88.9 fl (ref 78.0–100.0)
MONOS PCT: 9.4 % (ref 3.0–12.0)
Monocytes Absolute: 0.5 10*3/uL (ref 0.1–1.0)
Neutro Abs: 4.4 10*3/uL (ref 1.4–7.7)
Neutrophils Relative %: 78 % — ABNORMAL HIGH (ref 43.0–77.0)
PLATELETS: 257 10*3/uL (ref 150.0–400.0)
RBC: 4.29 Mil/uL (ref 3.87–5.11)
RDW: 14.7 % (ref 11.5–15.5)
WBC: 5.7 10*3/uL (ref 4.0–10.5)

## 2017-10-01 LAB — BASIC METABOLIC PANEL
BUN: 14 mg/dL (ref 6–23)
CALCIUM: 9.5 mg/dL (ref 8.4–10.5)
CO2: 30 mEq/L (ref 19–32)
CREATININE: 0.72 mg/dL (ref 0.40–1.20)
Chloride: 98 mEq/L (ref 96–112)
GFR: 82.27 mL/min (ref >60.00)
Glucose, Bld: 97 mg/dL (ref 70–99)
Potassium: 4.6 mEq/L (ref 3.5–5.1)
SODIUM: 136 meq/L (ref 135–145)

## 2017-10-01 LAB — POCT INR: INR: 3.1

## 2017-10-01 NOTE — Progress Notes (Signed)
I have reviewed and agree with note, evaluation, plan.   Amaad Byers, MD  

## 2017-10-01 NOTE — Patient Instructions (Signed)
Skip dosage today and then continue to take 5 mg daily except 2.5 mg on Wednesdays.    [facility] INR will be the home monitoring service.  Patient's daughter notified that the results must be faxed to Bailey Mech, RN.  Stacey [name] (dtr) [phone].  Re-check in 1 to 2 weeks.

## 2017-10-02 ENCOUNTER — Other Ambulatory Visit: Payer: Self-pay | Admitting: Emergency Medicine

## 2017-10-02 MED ORDER — DESLORATADINE 5 MG PO TABS: 5.0000 mg | ORAL_TABLET | Freq: Every day | ORAL | 1 refills | Status: DC

## 2017-10-02 NOTE — Telephone Encounter (Signed)
Stacey [name] has been sent to patient pharmacy. Nothing further needed.

## 2017-10-03 ENCOUNTER — Telehealth: Payer: Self-pay | Admitting: Family Medicine

## 2017-10-03 NOTE — Telephone Encounter (Signed)
Copied from CRM [id number]. Topic: Quick Communication - See Telephone Encounter >> Oct 03, 2017  2:00 PM Oneal Grout wrote: CRM for notification. See Telephone encounter for: [facility] calling patient has some questions on why patient is having a new at home machine for INR.   10/03/17.

## 2017-10-06 NOTE — Telephone Encounter (Signed)
Please advise 

## 2017-10-06 NOTE — Telephone Encounter (Signed)
You would have to ask the pt that.  She was doing home INR prior to relocating to the area.

## 2017-10-08 ENCOUNTER — Ambulatory Visit (INDEPENDENT_AMBULATORY_CARE_PROVIDER_SITE_OTHER): Payer: Medicare Other | Admitting: General Practice

## 2017-10-08 DIAGNOSIS — Z7901 Long term (current) use of anticoagulants: Secondary | ICD-10-CM | POA: Diagnosis not present

## 2017-10-08 DIAGNOSIS — I482 Chronic atrial fibrillation, unspecified: Secondary | ICD-10-CM

## 2017-10-08 LAB — POCT INR: INR: 3.5

## 2017-10-08 NOTE — Progress Notes (Signed)
I have reviewed this visit and I agree on the patient's plan of dosage and recommendations. Shyna Duignan, DO   

## 2017-10-08 NOTE — Telephone Encounter (Signed)
Patient chose to switch to [facility] because she was not happy with [facility].  Patient has spoken with [facility] to alert them of the change.

## 2017-10-08 NOTE — Patient Instructions (Signed)
Skip dosage today and then take 5 mg daily except 2.5 mg on Wednesdays and Saturdays.    [facility] will be the home monitoring service.  Patient's daughter notified that the results must be faxed to Bailey Mech, RN.  [name] (dtr) [phone].  Re-check in 1 to 2 weeks.

## 2017-10-10 NOTE — Telephone Encounter (Signed)
Pt called to check on RX for zantac 150mg  stating her previous doctor in [place] prescribed it to use intermittently when needed for breakthrough GERD. Pt uses the omeprazole 1 in morning and 1 at night. She uses Zantac just as needed. She is requesting Zantac RX be sent to [facility].

## 2017-10-13 MED ORDER — RANITIDINE HCL 150 MG PO TABS: 150.0000 mg | ORAL_TABLET | Freq: Two times a day (BID) | ORAL | 0 refills | Status: DC | PRN

## 2017-10-13 NOTE — Telephone Encounter (Signed)
Medication filled to pharmacy as requested.  Called patient at primary number and spoke w/ daughter, [name], who recommended I try pt's mobile number. No answer on mobile and no voicemail set up. Will attempt call back at another time.

## 2017-10-13 NOTE — Telephone Encounter (Signed)
Please advise 

## 2017-10-13 NOTE — Addendum Note (Signed)
Addended by: Starla Link on: 10/13/2017 11:42 AM   Modules accepted: Orders

## 2017-10-13 NOTE — Telephone Encounter (Signed)
Ok to send in rx for [facility]ac [facility].

## 2017-10-14 NOTE — Telephone Encounter (Signed)
Left message notifying patient that medication was filled.

## 2017-10-17 ENCOUNTER — Ambulatory Visit (INDEPENDENT_AMBULATORY_CARE_PROVIDER_SITE_OTHER): Payer: Medicare Other | Admitting: General Practice

## 2017-10-17 DIAGNOSIS — I482 Chronic atrial fibrillation, unspecified: Secondary | ICD-10-CM

## 2017-10-17 DIAGNOSIS — Z7901 Long term (current) use of anticoagulants: Secondary | ICD-10-CM

## 2017-10-17 LAB — POCT INR: INR: 2.4

## 2017-10-17 NOTE — Patient Instructions (Signed)
Pre visit review using our clinic review tool, if applicable. No additional management support is needed unless otherwise documented below in the visit note.  Continue to take 5 mg daily except 2.5 mg on Wednesdays and Saturdays.    [facility] will be the home monitoring service.  Patient's daughter notified that the results must be faxed to Cindy Ramia Sidney, RN.  Janice (dtr) [phone].  Re-check in 1 to 2 weeks.    

## 2017-10-21 DIAGNOSIS — I482 Chronic atrial fibrillation: Secondary | ICD-10-CM | POA: Diagnosis not present

## 2017-10-21 DIAGNOSIS — Z7901 Long term (current) use of anticoagulants: Secondary | ICD-10-CM | POA: Diagnosis not present

## 2017-10-21 LAB — POCT INR: INR: 2.4

## 2017-10-24 ENCOUNTER — Ambulatory Visit (INDEPENDENT_AMBULATORY_CARE_PROVIDER_SITE_OTHER): Payer: Medicare Other | Admitting: General Practice

## 2017-10-24 DIAGNOSIS — I482 Chronic atrial fibrillation, unspecified: Secondary | ICD-10-CM

## 2017-10-24 DIAGNOSIS — Z7901 Long term (current) use of anticoagulants: Secondary | ICD-10-CM

## 2017-10-24 NOTE — Patient Instructions (Signed)
Pre visit review using our clinic review tool, if applicable. No additional management support is needed unless otherwise documented below in the visit note.  Continue to take 5 mg daily except 2.5 mg on Wednesdays and Saturdays.    [facility] will be the home monitoring service.  Patient's daughter notified that the results must be faxed to Cindy Millianna Szymborski, RN.  Janice (dtr) [phone].  Re-check in 1 to 2 weeks.    

## 2017-10-26 NOTE — Progress Notes (Signed)
Agree with management.  Stacey Mahler J Haizel Gatchell, MD  

## 2017-10-28 LAB — POCT INR: INR: 2.6

## 2017-10-31 ENCOUNTER — Ambulatory Visit (INDEPENDENT_AMBULATORY_CARE_PROVIDER_SITE_OTHER): Payer: Medicare Other | Admitting: General Practice

## 2017-10-31 DIAGNOSIS — I482 Chronic atrial fibrillation, unspecified: Secondary | ICD-10-CM

## 2017-10-31 DIAGNOSIS — Z7901 Long term (current) use of anticoagulants: Secondary | ICD-10-CM

## 2017-10-31 NOTE — Patient Instructions (Signed)
Pre visit review using our clinic review tool, if applicable. No additional management support is needed unless otherwise documented below in the visit note.  Continue to take 5 mg daily except 2.5 mg on Wednesdays and Saturdays.    [facility] will be the home monitoring service.  Patient's daughter notified that the results must be faxed to Cindy Caitlyn Buchanan, RN.  Janice (dtr) [phone].  Re-check in 1 to 2 weeks.    

## 2017-11-07 ENCOUNTER — Telehealth: Payer: Self-pay | Admitting: Family Medicine

## 2017-11-07 NOTE — Telephone Encounter (Signed)
Copied from CRM [id number]. Topic: Quick Communication - Rx Refill/Question >> Nov 07, 2017  4:37 PM Arlyss Gandy, NT wrote: Medication: aldactone and atorvastatin (LIPITOR)  Has the patient contacted their pharmacy? Yes.   (Agent: If no, request that the patient contact the pharmacy for the refill.) Preferred Pharmacy (with phone number or street name): [facility] Agent: Please be advised that RX refills may take up to 3 business days. We ask that you follow-up with your pharmacy.

## 2017-11-07 NOTE — Telephone Encounter (Signed)
Aldactone (not on med list) and Lipitor-last filled in 06/2017 by historical provider  Last OV: 09/30/17 PRF:[id number] Pharmacy: [facility] - [place], [place] - [address] [phone] (Phone) [phone] (Fax)

## 2017-11-11 LAB — POCT INR: INR: 2.5

## 2017-11-12 NOTE — Telephone Encounter (Signed)
Ok to refill medication     Please advise

## 2017-11-13 MED ORDER — ATORVASTATIN CALCIUM 10 MG PO TABS: 10.0000 mg | ORAL_TABLET | Freq: Every day | ORAL | 3 refills | Status: DC

## 2017-11-14 ENCOUNTER — Ambulatory Visit (INDEPENDENT_AMBULATORY_CARE_PROVIDER_SITE_OTHER): Payer: Medicare Other | Admitting: General Practice

## 2017-11-14 DIAGNOSIS — I482 Chronic atrial fibrillation, unspecified: Secondary | ICD-10-CM

## 2017-11-14 DIAGNOSIS — Z7901 Long term (current) use of anticoagulants: Secondary | ICD-10-CM

## 2017-11-14 NOTE — Patient Instructions (Signed)
Pre visit review using our clinic review tool, if applicable. No additional management support is needed unless otherwise documented below in the visit note.  Continue to take 5 mg daily except 2.5 mg on Wednesdays and Saturdays.    [facility] will be the home monitoring service.  Patient's daughter notified that the results must be faxed to Cindy Boyd, RN.  Janice (dtr) [phone].  Re-check in 1 to 2 weeks.    

## 2017-11-17 ENCOUNTER — Telehealth: Payer: Self-pay | Admitting: Family Medicine

## 2017-11-17 NOTE — Telephone Encounter (Signed)
Pt requesting refill of Gabapentin(Neurontin) 400mg  capsule. Mediation previously filled by historical provider.   LOV: 09/30/17   Dr. 10/02/17  [facility]

## 2017-11-17 NOTE — Telephone Encounter (Signed)
Copied from CRM [id number]. Topic: Quick Communication - Rx Refill/Question >> Nov 17, 2017  3:12 PM [place], [place] A wrote: Medication: gabapentin (NEURONTIN) 400 MG capsule [[id number]]  Has the patient contacted their pharmacy? no (Agent: If no, request that the patient contact the pharmacy for the refill.) Preferred Pharmacy (with phone number [facility] street [facility]): [facility]  Agent: Please be advised that RX refills may take up to 3 business days. We ask that you follow-up with your pharmacy.

## 2017-11-18 NOTE — Telephone Encounter (Signed)
Ok to refill medication 

## 2017-11-18 NOTE — Telephone Encounter (Signed)
That's fine

## 2017-11-20 ENCOUNTER — Other Ambulatory Visit: Payer: Self-pay | Admitting: Family Medicine

## 2017-11-20 MED ORDER — ATORVASTATIN CALCIUM 10 MG PO TABS: 10.0000 mg | ORAL_TABLET | Freq: Every day | ORAL | 3 refills | Status: DC

## 2017-11-20 MED ORDER — GABAPENTIN 400 MG PO CAPS: 400.0000 mg | ORAL_CAPSULE | Freq: Three times a day (TID) | ORAL | 3 refills | Status: DC

## 2017-11-20 NOTE — Telephone Encounter (Signed)
Pt called to check on status or refills,  Told her they were [facility] to [facility].Marland KitchenMarland KitchenPt wants these medications sent [facility]

## 2017-11-20 NOTE — Telephone Encounter (Signed)
rx was refilled and pt's daughter was made aware.

## 2017-11-21 ENCOUNTER — Other Ambulatory Visit: Payer: Self-pay | Admitting: Family Medicine

## 2017-11-21 ENCOUNTER — Telehealth: Payer: Self-pay | Admitting: Family Medicine

## 2017-11-21 MED ORDER — SPIRONOLACTONE 25 MG PO TABS: 12.5000 mg | ORAL_TABLET | Freq: Every day | ORAL | 3 refills | Status: DC

## 2017-11-21 NOTE — Telephone Encounter (Signed)
Pt called to request refill on Spironolactone 25 mg take 1/2 tablet every day, [facility] to [facility].This was prescribed by another provider, did not give name.

## 2017-11-21 NOTE — Telephone Encounter (Signed)
pts rx was sent to the pharmacy with the information pt's daughter provided.

## 2017-11-21 NOTE — Telephone Encounter (Signed)
Noted. rx was sent to the pharmacy.

## 2017-11-21 NOTE — Telephone Encounter (Signed)
Pts daughter was called and asked what was the dosage and frequency of pt's Aldactone (Diuretic).   She verbalized she would call the office back with information.

## 2017-11-26 DIAGNOSIS — I482 Chronic atrial fibrillation: Secondary | ICD-10-CM | POA: Diagnosis not present

## 2017-11-26 DIAGNOSIS — Z7901 Long term (current) use of anticoagulants: Secondary | ICD-10-CM | POA: Diagnosis not present

## 2017-11-26 LAB — POCT INR: INR: 2.4

## 2017-12-02 ENCOUNTER — Ambulatory Visit (INDEPENDENT_AMBULATORY_CARE_PROVIDER_SITE_OTHER): Payer: Medicare Other | Admitting: General Practice

## 2017-12-02 DIAGNOSIS — I482 Chronic atrial fibrillation, unspecified: Secondary | ICD-10-CM

## 2017-12-02 DIAGNOSIS — Z7901 Long term (current) use of anticoagulants: Secondary | ICD-10-CM | POA: Diagnosis not present

## 2017-12-02 NOTE — Patient Instructions (Signed)
Pre visit review using our clinic review tool, if applicable. No additional management support is needed unless otherwise documented below in the visit note.  Continue to take 5 mg daily except 2.5 mg on Wednesdays and Saturdays.    [facility] will be the home monitoring service.  Patient's daughter notified that the results must be faxed to Cindy Margert Edsall, RN.  Janice (dtr) [phone].  Re-check in 1 to 2 weeks.    

## 2017-12-10 ENCOUNTER — Encounter: Payer: Self-pay | Admitting: Family Medicine

## 2017-12-10 ENCOUNTER — Ambulatory Visit (INDEPENDENT_AMBULATORY_CARE_PROVIDER_SITE_OTHER): Payer: Medicare Other | Admitting: Family Medicine

## 2017-12-10 VITALS — BP 108/64 | HR 65 | Temp 97.2°F | Wt 111.2 lb

## 2017-12-10 DIAGNOSIS — R8290 Unspecified abnormal findings in urine: Secondary | ICD-10-CM | POA: Diagnosis not present

## 2017-12-10 DIAGNOSIS — M150 Primary generalized (osteo)arthritis: Secondary | ICD-10-CM | POA: Diagnosis not present

## 2017-12-10 DIAGNOSIS — N3000 Acute cystitis without hematuria: Secondary | ICD-10-CM | POA: Diagnosis not present

## 2017-12-10 DIAGNOSIS — M8949 Other hypertrophic osteoarthropathy, multiple sites: Secondary | ICD-10-CM

## 2017-12-10 DIAGNOSIS — M159 Polyosteoarthritis, unspecified: Secondary | ICD-10-CM

## 2017-12-10 LAB — POC URINALSYSI DIPSTICK (AUTOMATED)
Glucose, UA: NEGATIVE
NITRITE UA: NEGATIVE
PH UA: 5.5 (ref 5.0–8.0)
RBC UA: NEGATIVE
SPEC GRAV UA: 1.02 (ref 1.010–1.025)
UROBILINOGEN UA: 1 U/dL

## 2017-12-10 MED ORDER — TRAMADOL HCL 50 MG PO TABS: 100.0000 mg | ORAL_TABLET | Freq: Three times a day (TID) | ORAL | 0 refills | Status: DC | PRN

## 2017-12-10 MED ORDER — FOSFOMYCIN TROMETHAMINE 3 G PO PACK: 3.0000 g | PACK | Freq: Once | ORAL | 0 refills | Status: AC

## 2017-12-10 NOTE — Patient Instructions (Signed)

## 2017-12-10 NOTE — Progress Notes (Signed)
Subjective:    Patient ID: Stacey [name], female    DOB: April 16, 1933, 84 y.o.   MRN: [id number]  Chief Complaint  Patient presents with  . Nausea    HPI Patient was seen today for acute concern.  Patient endorses not feeling good x1 week including decreased appetite, nausea, foul-smelling urine and decreased urine output.  Patient denies fever, chills, vomiting.  Pt expresses being "sensitive to abxs", they typically cause gi issues.  Sulfa causes anaphylaxis.  Pt has a history of A. fib on Coumadin.  Pt request her INR be checked.  Pt has a home INR machine through a company called [employer] out of [place], [place]. Goal INR 2-3.  Pt also has a history of arthritis in multiple joints including her hands.  Pt states she has been on tramadol and low-dose prednisone for years for this.  In the past she was followed by rheumatology in [place].  Pt requesting increase in tramadol as she states prior to moving to [facility] she was on 2 tramadol as needed but the most recent prescription was written for 50 mg.  Pt states the majority of the time she is able to manage taking just 50 mg of tramadol but some days has increased pain.  Past Medical History:  Diagnosis Date  . Atrial fibrillation (HCC)   . Hernia of abdominal cavity   . Kyphosis     Allergies  Allergen Reactions  . Sulfa Antibiotics Anaphylaxis  . Codeine Nausea And Vomiting  . Latex Rash  . Neosporin Original [Bacitracin-Neomycin-Polymyxin] Rash  . Polysporin [Bacitracin-Polymyxin B] Rash    ROS General: Denies fever, chills, night sweats, changes in weight, changes in appetite   +decreased appetite HEENT: Denies headaches, ear pain, changes in vision, rhinorrhea, sore throat CV: Denies CP, palpitations, SOB, orthopnea Pulm: Denies SOB, cough, wheezing GI: Denies abdominal pain, vomiting, diarrhea, constipation  +nausea GU: Denies dysuria, hematuria, frequency, vaginal discharge   +malodorous urine, decreased urine output Msk:  Denies muscle cramps  + joint pains Neuro: Denies weakness, numbness, tingling Skin: Denies rashes, bruising Psych: Denies depression, anxiety, hallucinations     Objective:    Blood pressure 108/64, pulse 65, temperature (!) 97.2 F (36.2 C), temperature source Oral, weight 111 lb 3.2 oz (50.4 kg), SpO2 97 %.   Gen. Pleasant, well-nourished, in no distress, normal affect   HEENT: [place]/AT, face symmetric, no scleral icterus, PERRLA, nares patent without drainage Lungs: no accessory muscle use, CTAB, no wheezes or rales Cardiovascular: RRR, no m/r/g, no peripheral edema Abdomen: BS present, soft, NT/ND Musculoskeletal: No deformities, no cyanosis or clubbing, normal tone.  No CVA tenderness.  Pt with thoracic scoliosis and kyphosis and head leaning to the right at baseline.  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   Wt Readings from Last 3 Encounters:  12/10/17 111 lb 3.2 oz (50.4 kg)  09/30/17 114 lb 14.4 oz (52.1 kg)  09/12/17 110 lb (49.9 kg)    Lab Results  Component Value Date   WBC 5.7 09/30/2017   HGB 12.8 09/30/2017   HCT 38.1 09/30/2017   PLT 257.0 09/30/2017   GLUCOSE 97 09/30/2017   ALT 18 07/28/2017   AST 34 07/28/2017   NA 136 09/30/2017   K 4.6 09/30/2017   CL 98 09/30/2017   CREATININE 0.72 09/30/2017   BUN 14 09/30/2017   CO2 30 09/30/2017  INR 2.4 11/26/2017    Assessment/Plan:  Acute cystitis without hematuria  - given patient's sensitivity to antibiotics and allergy to sulfa we will try fosfomycin -We will send for urine culture.  If needed we will adjust antibiotics. -Patient given handout - Plan: fosfomycin (MONUROL) 3 g PACK -pt advised to eat yogurt or take a probiotic with abx. 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  Will call pt w/ UCx results.  Abbe Amsterdam, MD

## 2017-12-11 LAB — URINE CULTURE
MICRO NUMBER: 90531609
Result:: NO GROWTH
SPECIMEN QUALITY:: ADEQUATE

## 2017-12-15 ENCOUNTER — Telehealth: Payer: Self-pay | Admitting: *Deleted

## 2017-12-15 NOTE — Telephone Encounter (Signed)
Called patient and left message to return call. Need to know name of medication.

## 2017-12-15 NOTE — Telephone Encounter (Signed)
Copied from CRM [id number]. Topic: General - Other >> Dec 15, 2017 11:53 AM Percival Spanish wrote:  Pt would like a call back concerning the below med. She said she was taking 50mg  of the below med and now she has a RX that say take 100mg  she would like to know which is correct   [phone]

## 2017-12-16 NOTE — Telephone Encounter (Signed)
This medication should be used with caution in the elderly, but it is ok to increase the dose.  Pt should strongly consider counseling if she is continuing to have worsening symptoms.

## 2017-12-16 NOTE — Telephone Encounter (Signed)
Spoke w/ patient's daughter, [name] (on [place]).  Medication is Zoloft. At patient's first visit, states patient asked for an increase on Zoloft, but thought Dr. Salomon Fick told her no. Then new rx was sent for increased dose. Patient just wants to make sure it is okay for her to take the 100 mg dose. Please advise-per notes dose was increased to 100 mg.

## 2017-12-17 NOTE — Telephone Encounter (Signed)
Called patient and left message to return call

## 2017-12-18 NOTE — Telephone Encounter (Signed)
Pt called and message given to her regarding the Zoloft  from Dr. Salomon Fick. Pt stated that she still have 50 mg tabs and is planning on taking 2 of the  50 mg tablets to equal her new dosage. She wanted to finish out her bottle and only had a few left. She is requesting a call back if there is a problem with her doing that.

## 2017-12-18 NOTE — Telephone Encounter (Signed)
Noted  

## 2017-12-25 LAB — POCT INR: INR: 2.5

## 2017-12-26 ENCOUNTER — Ambulatory Visit (INDEPENDENT_AMBULATORY_CARE_PROVIDER_SITE_OTHER): Payer: Medicare Other | Admitting: General Practice

## 2017-12-26 DIAGNOSIS — I482 Chronic atrial fibrillation, unspecified: Secondary | ICD-10-CM

## 2017-12-26 DIAGNOSIS — Z7901 Long term (current) use of anticoagulants: Secondary | ICD-10-CM | POA: Diagnosis not present

## 2017-12-26 NOTE — Patient Instructions (Signed)
Pre visit review using our clinic review tool, if applicable. No additional management support is needed unless otherwise documented below in the visit note.  Continue to take 5 mg daily except 2.5 mg on Wednesdays and Saturdays.    [facility] will be the home monitoring service.  Patient's daughter notified that the results must be faxed to Cindy Boyd, RN.  Janice (dtr) [phone].  Re-check in 1 to 2 weeks.    

## 2017-12-31 LAB — POCT INR: INR: 2.4 (ref 2.0–3.0)

## 2018-01-06 ENCOUNTER — Ambulatory Visit (INDEPENDENT_AMBULATORY_CARE_PROVIDER_SITE_OTHER): Payer: Medicare Other | Admitting: General Practice

## 2018-01-06 DIAGNOSIS — I482 Chronic atrial fibrillation, unspecified: Secondary | ICD-10-CM

## 2018-01-06 DIAGNOSIS — Z7901 Long term (current) use of anticoagulants: Secondary | ICD-10-CM

## 2018-01-06 NOTE — Patient Instructions (Signed)
Pre visit review using our clinic review tool, if applicable. No additional management support is needed unless otherwise documented below in the visit note.  Continue to take 5 mg daily except 2.5 mg on Wednesdays and Saturdays.    [facility] will be the home monitoring service.  Patient's daughter notified that the results must be faxed to Cindy Boyd, RN.  Janice (dtr) [phone].  Re-check in 1 to 2 weeks.    

## 2018-01-07 DIAGNOSIS — I482 Chronic atrial fibrillation: Secondary | ICD-10-CM | POA: Diagnosis not present

## 2018-01-07 DIAGNOSIS — Z7901 Long term (current) use of anticoagulants: Secondary | ICD-10-CM | POA: Diagnosis not present

## 2018-01-09 ENCOUNTER — Ambulatory Visit (INDEPENDENT_AMBULATORY_CARE_PROVIDER_SITE_OTHER): Payer: Medicare Other | Admitting: General Practice

## 2018-01-09 DIAGNOSIS — Z7901 Long term (current) use of anticoagulants: Secondary | ICD-10-CM

## 2018-01-09 DIAGNOSIS — I482 Chronic atrial fibrillation, unspecified: Secondary | ICD-10-CM

## 2018-01-09 LAB — POCT INR: INR: 2.1 (ref 2.0–3.0)

## 2018-01-09 NOTE — Patient Instructions (Signed)
Pre visit review using our clinic review tool, if applicable. No additional management support is needed unless otherwise documented below in the visit note.  Continue to take 5 mg daily except 2.5 mg on Wednesdays and Saturdays.    [facility] will be the home monitoring service.  Patient's daughter notified that the results must be faxed to Cindy Jenesa Foresta, RN.  Janice (dtr) [phone].  Re-check in 1 to 2 weeks.    

## 2018-01-11 ENCOUNTER — Other Ambulatory Visit: Payer: Self-pay | Admitting: Physician Assistant

## 2018-01-11 MED ORDER — METOPROLOL TARTRATE 100 MG PO TABS: 100.0000 mg | ORAL_TABLET | Freq: Two times a day (BID) | ORAL | 11 refills | Status: DC

## 2018-01-13 ENCOUNTER — Telehealth: Payer: Self-pay | Admitting: Family Medicine

## 2018-01-13 NOTE — Telephone Encounter (Unsigned)
Copied from CRM [id number]. Topic: Quick Communication - Rx Refill/Question >> Jan 13, 2018 11:27 AM Raquel Sarna wrote: prednisoLONE 5 MG TABS tablet  Needing refills - original Rx was sent by [facility] to old address.  Pt didn't get the medication  [facility] [address] [place], [place] - [address] [place] [address] Phone: [phone] Fax: [phone]

## 2018-01-14 NOTE — Telephone Encounter (Signed)
Prednisone is listed as a historical medication / LOV 12/10/2017 with Dr. Salomon Fick  LOV well check on 09/30/2017 with Dr. Salomon Fick / Will send to provider to review since prednisone is not listed in last 2 office notes /

## 2018-01-16 ENCOUNTER — Telehealth: Payer: Self-pay | Admitting: Family Medicine

## 2018-01-16 NOTE — Telephone Encounter (Signed)
Copied from CRM [id number]. Topic: Quick Communication - See Telephone Encounter >> Jan 16, 2018 11:54 AM Windy Kalata, NT wrote: CRM for notification. See Telephone encounter for: 01/16/18.  Patient is calling and requesting a refill on torsemide (DEMADEX) 20 MG tablet. Please advise.   [facility] - [place], [place] - [address] [place] [address] Phone: [phone] Fax: [phone]

## 2018-01-19 ENCOUNTER — Other Ambulatory Visit: Payer: Self-pay | Admitting: Family Medicine

## 2018-01-19 MED ORDER — PREDNISOLONE 5 MG PO TABS: 5.0000 mg | ORAL_TABLET | Freq: Every day | ORAL | 2 refills | Status: DC

## 2018-01-19 MED ORDER — TORSEMIDE 20 MG PO TABS: 40.0000 mg | ORAL_TABLET | Freq: Every day | ORAL | 2 refills | Status: DC

## 2018-01-19 NOTE — Telephone Encounter (Signed)
Torsemide and prednisone sent to mail order pharmacy.

## 2018-01-21 LAB — POCT INR: INR: 2.4 (ref 2.0–3.0)

## 2018-01-23 ENCOUNTER — Ambulatory Visit (INDEPENDENT_AMBULATORY_CARE_PROVIDER_SITE_OTHER): Payer: Medicare Other | Admitting: General Practice

## 2018-01-23 DIAGNOSIS — I482 Chronic atrial fibrillation, unspecified: Secondary | ICD-10-CM

## 2018-01-23 DIAGNOSIS — Z7901 Long term (current) use of anticoagulants: Secondary | ICD-10-CM

## 2018-01-23 NOTE — Patient Instructions (Signed)
Pre visit review using our clinic review tool, if applicable. No additional management support is needed unless otherwise documented below in the visit note.  Continue to take 5 mg daily except 2.5 mg on Wednesdays and Saturdays.    [facility] will be the home monitoring service.  Patient's daughter notified that the results must be faxed to Cindy Boyd, RN.  Janice (dtr) [phone].  Re-check in 1 to 2 weeks.    

## 2018-01-24 ENCOUNTER — Emergency Department (HOSPITAL_COMMUNITY): Payer: Medicare Other

## 2018-01-24 ENCOUNTER — Emergency Department (HOSPITAL_COMMUNITY)
Admission: EM | Admit: 2018-01-24 | Discharge: 2018-01-24 | Disposition: A | Discharge status: Home / Self Care | Payer: Medicare Other | Attending: Emergency Medicine | Admitting: Emergency Medicine

## 2018-01-24 ENCOUNTER — Encounter (HOSPITAL_COMMUNITY): Payer: Self-pay | Admitting: Emergency Medicine

## 2018-01-24 DIAGNOSIS — Z79899 Other long term (current) drug therapy: Secondary | ICD-10-CM | POA: Insufficient documentation

## 2018-01-24 DIAGNOSIS — Z87891 Personal history of nicotine dependence: Secondary | ICD-10-CM | POA: Diagnosis not present

## 2018-01-24 DIAGNOSIS — R059 Cough, unspecified: Secondary | ICD-10-CM

## 2018-01-24 DIAGNOSIS — Z91040 Latex allergy status: Secondary | ICD-10-CM | POA: Insufficient documentation

## 2018-01-24 DIAGNOSIS — R05 Cough: Secondary | ICD-10-CM | POA: Diagnosis not present

## 2018-01-24 DIAGNOSIS — R0789 Other chest pain: Secondary | ICD-10-CM

## 2018-01-24 DIAGNOSIS — R072 Precordial pain: Secondary | ICD-10-CM | POA: Diagnosis not present

## 2018-01-24 DIAGNOSIS — K449 Diaphragmatic hernia without obstruction or gangrene: Secondary | ICD-10-CM | POA: Diagnosis not present

## 2018-01-24 DIAGNOSIS — R079 Chest pain, unspecified: Secondary | ICD-10-CM | POA: Diagnosis not present

## 2018-01-24 DIAGNOSIS — I4891 Unspecified atrial fibrillation: Secondary | ICD-10-CM | POA: Insufficient documentation

## 2018-01-24 DIAGNOSIS — E876 Hypokalemia: Secondary | ICD-10-CM | POA: Insufficient documentation

## 2018-01-24 LAB — CBC
HEMATOCRIT: 41.5 % (ref 36.0–46.0)
HEMOGLOBIN: 13.5 g/dL (ref 12.0–15.0)
MCH: 29.5 pg (ref 26.0–34.0)
MCHC: 32.5 g/dL (ref 30.0–36.0)
MCV: 90.8 fL (ref 78.0–100.0)
Platelets: 248 10*3/uL (ref 150–400)
RBC: 4.57 MIL/uL (ref 3.87–5.11)
RDW: 14 % (ref 11.5–15.5)
WBC: 6.9 10*3/uL (ref 4.0–10.5)

## 2018-01-24 LAB — BASIC METABOLIC PANEL
Anion gap: 9 (ref 5–15)
BUN: 13 mg/dL (ref 6–20)
CHLORIDE: 96 mmol/L — AB (ref 101–111)
CO2: 31 mmol/L (ref 22–32)
Calcium: 9.2 mg/dL (ref 8.9–10.3)
Creatinine, Ser: 0.88 mg/dL (ref 0.44–1.00)
GFR calc Af Amer: >60 mL/min (ref >60)
GFR calc non Af Amer: 59 mL/min — ABNORMAL LOW (ref >60)
Glucose, Bld: 104 mg/dL — ABNORMAL HIGH (ref 65–99)
Potassium: 3.4 mmol/L — ABNORMAL LOW (ref 3.5–5.1)
Sodium: 136 mmol/L (ref 135–145)

## 2018-01-24 LAB — I-STAT TROPONIN, ED: Troponin i, poc: 0 ng/mL (ref 0.00–0.08)

## 2018-01-24 LAB — PROTIME-INR
INR: 2.52
Prothrombin Time: 26.9 seconds — ABNORMAL HIGH (ref 11.4–15.2)

## 2018-01-24 MED ORDER — DOXYCYCLINE HYCLATE 100 MG PO CAPS: 100.0000 mg | ORAL_CAPSULE | Freq: Two times a day (BID) | ORAL | 0 refills | Status: DC

## 2018-01-24 MED ORDER — POTASSIUM CHLORIDE CRYS ER 20 MEQ PO TBCR
40.0000 meq | EXTENDED_RELEASE_TABLET | Freq: Once | ORAL | Status: AC
  Administered 2018-01-24: 40 meq via ORAL
  Filled 2018-01-24: qty 2

## 2018-01-24 MED ORDER — AZITHROMYCIN 250 MG PO TABS
500.0000 mg | ORAL_TABLET | Freq: Once | ORAL | Status: AC
  Administered 2018-01-24: 500 mg via ORAL
  Filled 2018-01-24: qty 2

## 2018-01-24 MED ORDER — POTASSIUM CHLORIDE CRYS ER 20 MEQ PO TBCR: 20.0000 meq | EXTENDED_RELEASE_TABLET | Freq: Every day | ORAL | 0 refills | Status: DC

## 2018-01-24 MED ORDER — ACETAMINOPHEN 325 MG PO TABS
650.0000 mg | ORAL_TABLET | Freq: Once | ORAL | Status: AC
  Administered 2018-01-24: 650 mg via ORAL
  Filled 2018-01-24: qty 2

## 2018-01-24 NOTE — Discharge Instructions (Addendum)
It was our pleasure to provide your ER care today - we hope that you feel better.  For cough, take antibiotic as prescribed. Take mucinex or robitussin as need for symptom relief.  Take acetaminophen as need.   From today's labs, your potassium level is slightly low (3.4) - eat plenty of fruits and vegetables, take potassium supplement as prescribed, and follow up with your doctor for recheck in the coming week.   For today's symptoms, also follow up with your doctor in the coming week.  Return to ER if worse, new symptoms, high fevers, trouble breathing, recurrent or persistent chest pain, other concern.

## 2018-01-24 NOTE — ED Notes (Signed)
Patients daughter, Lemar Lofty called and wanted to give her number just in case someone may need her before she is able to get here. [phone]

## 2018-01-24 NOTE — ED Provider Notes (Addendum)
[facility] EMERGENCY DEPARTMENT Provider Note   CSN: [id number] Arrival date & time: 01/24/18  0706     History   Chief Complaint Chief Complaint  Patient presents with  . Chest Pain    HPI Stacey [name] is a 84 y.o. female.  Patient c/o left chest pain for past day. Pain is dull, moderate, constant, worse w movement, change of position. States last week did feel she strained her left shoulder/left side when reaching for something. Pain occurs at rest, no related to exertion or activity, non radiating, not pleuritic. No associated sob, nv or diaphoresis. No fever or chills. + cough - non prod. Family member notes recent increased cough in past couple days.  No unusual fatigue or doe.   The history is provided by the patient.  Chest Pain   Associated symptoms include cough. Pertinent negatives include no abdominal pain, no fever, no headaches and no shortness of breath.    Past Medical History:  Diagnosis Date  . Atrial fibrillation (HCC)   . Hernia of abdominal cavity   . Kyphosis     Patient Active Problem List   Diagnosis Date Noted  . Long term (current) use of anticoagulants [Z79.01] 07/25/2017  . Chronic atrial fibrillation (HCC) 07/11/2017  . Primary osteoarthritis involving multiple joints 07/11/2017  . Pseudogout 07/11/2017  . Neuropathy 07/11/2017  . Lymphedema 07/11/2017  . Kyphosis of cervical region 07/11/2017  . Congestive heart failure (HCC) 07/11/2017    Past Surgical History:  Procedure Laterality Date  . ABDOMINAL HYSTERECTOMY    . TONSILLECTOMY       OB History   None      Home Medications    Prior to Admission medications   Medication Sig Start Date End Date Taking? Authorizing Provider  atorvastatin (LIPITOR) 10 MG tablet Take 1 tablet (10 mg total) by mouth daily. 11/20/17   [name], MD  cyanocobalamin 1000 MCG tablet Take 1,000 mcg by mouth daily.    [provider]  desloratadine (CLARINEX)  5 MG tablet Take 1 tablet (5 mg total) by mouth daily. 10/02/17   [name], MD  gabapentin (NEURONTIN) 400 MG capsule Take 1 capsule (400 mg total) by mouth 3 (three) times daily. 11/20/17   [name], MD  meclizine (ANTIVERT) 12.5 MG tablet Take 1 tablet (12.5 mg total) by mouth 3 (three) times daily as needed for dizziness. 09/30/17   [name], MD  metoprolol tartrate (LOPRESSOR) 100 MG tablet Take 1 tablet (100 mg total) by mouth 2 (two) times daily. 01/11/18   Barrett, Joline Salt, PA-C  Multiple Vitamin (MULTIVITAMIN) capsule Take 1 capsule by mouth daily.    [provider]  omeprazole (PRILOSEC) 20 MG capsule Take 20 mg by mouth daily.    [provider]  prednisoLONE 5 MG TABS tablet Take 1 tablet (5 mg total) by mouth daily. 01/19/18   [name], MD  ranitidine (ZANTAC) 150 MG tablet Take 1 tablet (150 mg total) by mouth 2 (two) times daily as needed for heartburn. 10/13/17   [name], MD  sertraline (ZOLOFT) 100 MG tablet Take 1 tablet (100 mg total) by mouth daily. 09/30/17   [name], MD  spironolactone (ALDACTONE) 25 MG tablet Take 0.5 tablets (12.5 mg total) by mouth daily. 11/21/17   [name], MD  torsemide (DEMADEX) 20 MG tablet Take 2 tablets (40 mg total) by mouth daily. 01/19/18   [name],  MD  traMADol (ULTRAM) 50 MG tablet Take 50 mg by mouth every 6 (six) hours as needed for moderate pain.    [provider]  traMADol (ULTRAM) 50 MG tablet Take 2 tablets (100 mg total) by mouth every 8 (eight) hours as needed for severe pain. 12/10/17   [name], MD  VITAMIN D, CHOLECALCIFEROL, PO Take 1,000 Units by mouth daily.     [provider]    Family History No family history on file.  Social History Social History   Tobacco Use  . Smoking status: Former Games developer  . Smokeless tobacco: Never Used  Substance Use Topics  . Alcohol use: No    Frequency: Never  . Drug use: No      Allergies   Sulfa antibiotics; Codeine; Latex; Neosporin original [bacitracin-neomycin-polymyxin]; and Polysporin [bacitracin-polymyxin b]   Review of Systems Review of Systems  Constitutional: Negative for chills and fever.  HENT: Negative for sore throat.   Eyes: Negative for redness.  Respiratory: Positive for cough. Negative for shortness of breath.   Cardiovascular: Positive for chest pain.  Gastrointestinal: Negative for abdominal pain.  Genitourinary: Negative for flank pain.  Musculoskeletal: Negative for neck pain.  Skin: Negative for rash.  Neurological: Negative for headaches.  Hematological: Does not bruise/bleed easily.  Psychiatric/Behavioral: Negative for confusion.     Physical Exam Updated Vital Signs BP 136/81 (BP Location: Left Arm)   Pulse 84   Temp 98.3 F (36.8 C) (Oral)   Resp 20   Ht 1.6 m (5\' 3" )   Wt 49 kg (108 lb)   SpO2 96%   BMI 19.13 kg/m   Physical Exam  Constitutional: She appears well-developed and well-nourished.  HENT:  Head: Atraumatic.  Eyes: Conjunctivae are normal. No scleral icterus.  Neck: Neck supple. No tracheal deviation present.  Cardiovascular: Normal rate, regular rhythm, normal heart sounds and intact distal pulses. Exam reveals no gallop and no friction rub.  No murmur heard. Pulmonary/Chest: Effort normal and breath sounds normal. No respiratory distress. She exhibits tenderness.  Abdominal: Soft. Normal appearance and bowel sounds are normal. She exhibits no distension. There is no tenderness.  Musculoskeletal: She exhibits no edema or tenderness.  Neurological: She is alert.  Skin: Skin is warm and dry. No rash noted. She is not diaphoretic.  Psychiatric: She has a normal mood and affect.  Nursing note and vitals reviewed.    ED Treatments / Results  Labs (all labs ordered are listed, but only abnormal results are displayed) Results for orders placed or performed during the hospital encounter of  01/24/18  CBC  Result Value Ref Range   WBC 6.9 4.0 - 10.5 K/uL   RBC 4.57 3.87 - 5.11 MIL/uL   Hemoglobin 13.5 12.0 - 15.0 g/dL   HCT 32.1 22.4 - 82.5 %   MCV 90.8 78.0 - 100.0 fL   MCH 29.5 26.0 - 34.0 pg   MCHC 32.5 30.0 - 36.0 g/dL   RDW 00.3 70.4 - 88.8 %   Platelets 248 150 - 400 K/uL  Basic metabolic panel  Result Value Ref Range   Sodium 136 135 - 145 mmol/L   Potassium 3.4 (L) 3.5 - 5.1 mmol/L   Chloride 96 (L) 101 - 111 mmol/L   CO2 31 22 - 32 mmol/L   Glucose, Bld 104 (H) 65 - 99 mg/dL   BUN 13 6 - 20 mg/dL   Creatinine, Ser 9.16 0.44 - 1.00 mg/dL   Calcium 9.2 8.9 - 10.3  mg/dL   GFR calc non Af Amer 59 (L) >60 mL/min   GFR calc Af Amer >60 >60 mL/min   Anion gap 9 5 - 15  Protime-INR  Result Value Ref Range   Prothrombin Time 26.9 (H) 11.4 - 15.2 seconds   INR 2.52   I-stat troponin, ED  Result Value Ref Range   Troponin i, poc 0.00 0.00 - 0.08 ng/mL   Comment 3           Dg Chest 2 View  Result Date: 01/24/2018 CLINICAL DATA:  Left chest tightness radiating to the back. Popping sensation. EXAM: CHEST - 2 VIEW COMPARISON:  07/28/2017. FINDINGS: Patient is slightly rotated. Trachea is midline. Heart is enlarged. Thoracic aorta is calcified. Large hiatal hernia. Lungs are hyperinflated. Linear scarring in the right midlung zone. Difficult to exclude retrocardiac left lower lobe opacification, especially on the lateral view. Biapical pleural thickening. No pleural fluid. Osteopenia.  Flowing anterior osteophytosis. IMPRESSION: 1. Difficult to exclude retrocardiac left lower lobe airspace opacification and therefore, pneumonia. Aortic atherosclerosis (ICD10-170.0). 2. Large hiatal hernia. Electronically Signed   By: Leanna Battles M.D.   On: 01/24/2018 07:57    EKG EKG Interpretation  Date/Time:  Saturday January 24 2018 07:14:19 EDT Ventricular Rate:  72 PR Interval:    QRS Duration: 104 QT Interval:  456 QTC Calculation: 500 R Axis:   107 Text Interpretation:   Atrial fibrillation Borderline prolonged QT interval Confirmed by Cathren Laine ([id number]) on 01/24/2018 10:31:32 AM   Radiology Dg Chest 2 View  Result Date: 01/24/2018 CLINICAL DATA:  Left chest tightness radiating to the back. Popping sensation. EXAM: CHEST - 2 VIEW COMPARISON:  07/28/2017. FINDINGS: Patient is slightly rotated. Trachea is midline. Heart is enlarged. Thoracic aorta is calcified. Large hiatal hernia. Lungs are hyperinflated. Linear scarring in the right midlung zone. Difficult to exclude retrocardiac left lower lobe opacification, especially on the lateral view. Biapical pleural thickening. No pleural fluid. Osteopenia.  Flowing anterior osteophytosis. IMPRESSION: 1. Difficult to exclude retrocardiac left lower lobe airspace opacification and therefore, pneumonia. Aortic atherosclerosis (ICD10-170.0). 2. Large hiatal hernia. Electronically Signed   By: Leanna Battles M.D.   On: 01/24/2018 07:57    Procedures Procedures (including critical care time)  Medications Ordered in ED Medications - No data to display   Initial Impression / Assessment and Plan / ED Course  I have reviewed the triage vital signs and the nursing notes.  Pertinent labs & imaging results that were available during my care of the patient were reviewed by me and considered in my medical decision making (see chart for details).  Symptoms constant since yesterday. Labs, ecg, and cxr.  Reviewed nursing notes and prior charts for additional history.   pts pain does appear worse w palpation of area, movement of torso.   Acetaminophen po.   Recheck pt comfortable, no distress.   Pt w reproducible chest wall pain/tenderness. After symptoms since yesterday, trop is normal.  Family does not increased cough/congestion, possible infiltrate on cxr. Will tx for possible early pna.   Also on lab review, k sl low. kcl po.   Pt currently appears stable for d/c.    Final Clinical Impressions(s) / ED Diagnoses    Final diagnoses:  None    ED Discharge Orders    None            Cathren Laine, MD 01/24/18 1214

## 2018-01-24 NOTE — ED Triage Notes (Signed)
Per [facility] patient coming from home complaining of left chest tightness that radiates into her back that has gotten worse since Sunday after she felt a pop and the pain has gotten worse since then. Hx of afib. Afib with ems rate 60-100. Takes coumadin, hasn't taken today. A/O x4. 324 aspirin given by ems

## 2018-01-26 ENCOUNTER — Telehealth: Payer: Self-pay | Admitting: *Deleted

## 2018-01-26 NOTE — Telephone Encounter (Signed)
Will send to [facility] to advise on Coumadin

## 2018-01-26 NOTE — Telephone Encounter (Signed)
Message left about dosing instructions  on daughter, Janice's VM.  Countinue coumadin as previously instructed and check INR on Tuesday, 6/18.

## 2018-01-26 NOTE — Telephone Encounter (Signed)
Copied from CRM [id number]. Topic: Inquiry >> Jan 26, 2018 11:04 AM Crist Infante wrote: Reason for CRM:  pt states she went to the ED on Sat and dx with Pleurisy.  1. Pt states they advised her to let her dr know and make a follow up appt.  Pt has scheduled hospital follow up. 2. Pt was put on doxycycline (VIBRAMYCIN) 100 MG capsule and potassium chloride SA (K-DUR,KLOR-CON) 20 MEQ tablet. Pt wants to know what to do about her coum? Pt states her daughter checks her coum and they send to [place]. Need to check with [facility]. Please let pt know

## 2018-01-27 ENCOUNTER — Telehealth: Payer: Self-pay | Admitting: Family Medicine

## 2018-01-27 NOTE — Telephone Encounter (Unsigned)
Copied from CRM [id number]. Topic: Quick Communication - See Telephone Encounter >> Jan 27, 2018 12:01 PM Stacey [name] wrote: Pt is needing to get a refill on metoprolol and [facility]  Best number [phone]

## 2018-01-28 NOTE — Telephone Encounter (Signed)
Refill of Metoprolol  Historical provider  LOV 12/10/17 Dr Salomon Fick  [facility] 01/11/18  #60    If appropriate   Please send to [facility], not [facility]

## 2018-01-29 ENCOUNTER — Other Ambulatory Visit: Payer: Self-pay

## 2018-01-29 MED ORDER — METOPROLOL TARTRATE 100 MG PO TABS: 100.0000 mg | ORAL_TABLET | Freq: Two times a day (BID) | ORAL | 11 refills | Status: DC

## 2018-01-29 NOTE — Telephone Encounter (Signed)
[facility],Previous Refill sent to the wrong pharmacy

## 2018-01-30 ENCOUNTER — Ambulatory Visit (INDEPENDENT_AMBULATORY_CARE_PROVIDER_SITE_OTHER): Payer: Medicare Other | Admitting: General Practice

## 2018-01-30 DIAGNOSIS — I482 Chronic atrial fibrillation, unspecified: Secondary | ICD-10-CM

## 2018-01-30 DIAGNOSIS — Z7901 Long term (current) use of anticoagulants: Secondary | ICD-10-CM

## 2018-01-30 LAB — POCT INR: INR: 2 (ref 2.0–3.0)

## 2018-01-30 NOTE — Patient Instructions (Signed)
Pre visit review using our clinic review tool, if applicable. No additional management support is needed unless otherwise documented below in the visit note.  Continue to take 5 mg daily except 2.5 mg on Wednesdays and Saturdays.    [employer] [facility] [employer] will be the home monitoring service.  Patient's daughter notified that the results must be faxed to Cindy Boyd, RN.  Stacey [name] (dtr) [phone].  Re-check in 1 to 2 weeks.    

## 2018-02-05 ENCOUNTER — Encounter: Payer: Self-pay | Admitting: Family Medicine

## 2018-02-05 ENCOUNTER — Ambulatory Visit (INDEPENDENT_AMBULATORY_CARE_PROVIDER_SITE_OTHER): Payer: Medicare Other | Admitting: Family Medicine

## 2018-02-05 VITALS — BP 110/74 | HR 102 | Temp 98.6°F | Wt 108.0 lb

## 2018-02-05 DIAGNOSIS — I509 Heart failure, unspecified: Secondary | ICD-10-CM | POA: Diagnosis not present

## 2018-02-05 DIAGNOSIS — R0789 Other chest pain: Secondary | ICD-10-CM

## 2018-02-05 NOTE — Progress Notes (Signed)
Subjective:    Patient ID: Stacey [name], female    DOB: 1932-11-01, 84 y.o.   MRN: [id number]  No chief complaint on file. Patient by her daughter.  HPI Patient was seen today for follow-up.  Patient was seen in the ED on 01/24/2018 for chest pain and cough.  She was treated for possible early pneumonia given chest x-ray with possible infiltrate.  Pt also told she had costochondritis.  Pt completed doxycycline bid x 7 days.  Pt also given K+ supplement as was 3.4.  Since ED visit patient states she is been doing better.  Pt is gaining her strength, no longer coughing.  Appetite is good.  Pt endorses R hip pain into R lateral thigh.  Pt was taking gabapentin, but has not taken it in several months as she thought she no longer needed it.   Past Medical History:  Diagnosis Date  . Atrial fibrillation (HCC)   . Hernia of abdominal cavity   . Kyphosis     Allergies  Allergen Reactions  . Sulfa Antibiotics Anaphylaxis  . Codeine Nausea And Vomiting  . Latex Rash  . Neosporin Original [Bacitracin-Neomycin-Polymyxin] Rash  . Polysporin [Bacitracin-Polymyxin B] Rash    ROS General: Denies fever, chills, night sweats, changes in weight, changes in appetite HEENT: Denies headaches, ear pain, changes in vision, rhinorrhea, sore throat CV: Denies CP, palpitations, SOB, orthopnea Pulm: Denies SOB, cough, wheezing GI: Denies abdominal pain, nausea, vomiting, diarrhea, constipation GU: Denies dysuria, hematuria, frequency, vaginal discharge Msk: Denies muscle cramps, joint pains  +R hip pain Neuro: Denies weakness, numbness, tingling Skin: Denies rashes, bruising Psych: Denies depression, anxiety, hallucinations     Objective:    Blood pressure 110/74, pulse (!) 102, temperature 98.6 F (37 C), temperature source Oral, weight 108 lb (49 kg), SpO2 95 %.   Gen. Pleasant, well-nourished, in no distress, normal affect  HEENT: [place]/AT, face symmetric, no scleral icterus, PERRLA, nares  patent without drainage, pharynx without erythema or exudate. Lungs: no accessory muscle use, CTAB, no wheezes or rales.  Mild discomfort with deep breath Cardiovascular: RRR, no m/r/g, no peripheral edema Musculoskeletal: No TTP of chest.  L scoliosis causing pt to lean to the R Neuro:  A&Ox3, CN II-XII intact, normal gait   Wt Readings from Last 3 Encounters:  02/05/18 108 lb (49 kg)  01/24/18 108 lb (49 kg)  12/10/17 111 lb 3.2 oz (50.4 kg)    Lab Results  Component Value Date   WBC 6.9 01/24/2018   HGB 13.5 01/24/2018   HCT 41.5 01/24/2018   PLT 248 01/24/2018   GLUCOSE 104 (H) 01/24/2018   ALT 18 07/28/2017   AST 34 07/28/2017   NA 136 01/24/2018   K 3.4 (L) 01/24/2018   CL 96 (L) 01/24/2018   CREATININE 0.88 01/24/2018   BUN 13 01/24/2018   CO2 31 01/24/2018   INR 2.0 01/30/2018    Assessment/Plan:  Other chest pain  -improving since ED visit -likley 2/2 possible pneumonia s/p doxycycline vs costochondritis. -Given RTC or ED precautions for worsening symptoms.  Heart failure -Given recent hypokalemia will decrease tosemide to 20 mg daily. -If edema or increased shortness of breath occur will resume torsemide 40 mg daily -We will continue metoprolol 100 mg twice daily and Spironolactone 12.5 mg daily  Follow-up in the next few months, sooner if needed  Abbe Amsterdam, MD

## 2018-02-11 ENCOUNTER — Telehealth: Payer: Self-pay | Admitting: Family Medicine

## 2018-02-11 ENCOUNTER — Other Ambulatory Visit: Payer: Self-pay

## 2018-02-11 ENCOUNTER — Other Ambulatory Visit: Payer: Self-pay | Admitting: *Deleted

## 2018-02-11 ENCOUNTER — Other Ambulatory Visit: Payer: Self-pay | Admitting: Family Medicine

## 2018-02-11 DIAGNOSIS — M21611 Bunion of right foot: Secondary | ICD-10-CM | POA: Diagnosis not present

## 2018-02-11 DIAGNOSIS — M2042 Other hammer toe(s) (acquired), left foot: Secondary | ICD-10-CM | POA: Diagnosis not present

## 2018-02-11 DIAGNOSIS — M2041 Other hammer toe(s) (acquired), right foot: Secondary | ICD-10-CM | POA: Diagnosis not present

## 2018-02-11 DIAGNOSIS — L602 Onychogryphosis: Secondary | ICD-10-CM | POA: Diagnosis not present

## 2018-02-11 DIAGNOSIS — L84 Corns and callosities: Secondary | ICD-10-CM | POA: Diagnosis not present

## 2018-02-11 DIAGNOSIS — M21612 Bunion of left foot: Secondary | ICD-10-CM | POA: Diagnosis not present

## 2018-02-11 DIAGNOSIS — I739 Peripheral vascular disease, unspecified: Secondary | ICD-10-CM | POA: Diagnosis not present

## 2018-02-11 LAB — POCT INR: INR: 2.6 (ref 2.0–3.0)

## 2018-02-11 MED ORDER — OMEPRAZOLE 20 MG PO CPDR: 20.0000 mg | DELAYED_RELEASE_CAPSULE | Freq: Every day | ORAL | 0 refills | Status: DC

## 2018-02-11 MED ORDER — METOPROLOL TARTRATE 100 MG PO TABS: 100.0000 mg | ORAL_TABLET | Freq: Two times a day (BID) | ORAL | 3 refills | Status: DC

## 2018-02-11 NOTE — Telephone Encounter (Signed)
Rx changed to 90 day supply as requested.

## 2018-02-11 NOTE — Telephone Encounter (Signed)
Copied from CRM [id number]. Topic: Quick Communication - Rx Refill/Question >> Feb 11, 2018 10:15 AM Mickel Baas B, NT wrote: Medication: metoprolol tartrate (LOPRESSOR) 100 MG tablet (90 day supply)  Has the patient contacted their pharmacy? Yes.   (Agent: If no, request that the patient contact the pharmacy for the refill.) (Agent: If yes, when and what did the pharmacy advise?)  Preferred Pharmacy (with phone number or street name): [facility] - [place], [place] - [address]  Agent: Please be advised that RX refills may take up to 3 business days. We ask that you follow-up with your pharmacy.

## 2018-02-11 NOTE — Telephone Encounter (Signed)
Refill for prednisolone was sent to [facility] on 01/19/2018, 90 tablets with 2 refills and a refill for Omeprazole 90 tablets with no refills was sent to the same pharmacy today.

## 2018-02-11 NOTE — Telephone Encounter (Signed)
Copied from CRM [id number]. Topic: Quick Communication - See Telephone Encounter >> Feb 11, 2018 12:34 PM Waymon Amato wrote: [facility] to ask for a refill on omeprazole and prednisone   Reference number is [id number]  Best number to call them is [phone]

## 2018-02-11 NOTE — Telephone Encounter (Signed)
LOV  02/05/18 Dr. Salomon Fick Prilosec - historical provider Prednisone - already refilled.

## 2018-02-17 ENCOUNTER — Ambulatory Visit (INDEPENDENT_AMBULATORY_CARE_PROVIDER_SITE_OTHER): Payer: Medicare Other | Admitting: General Practice

## 2018-02-17 DIAGNOSIS — Z7901 Long term (current) use of anticoagulants: Secondary | ICD-10-CM

## 2018-02-17 DIAGNOSIS — I482 Chronic atrial fibrillation, unspecified: Secondary | ICD-10-CM

## 2018-02-17 NOTE — Patient Instructions (Signed)
Pre visit review using our clinic review tool, if applicable. No additional management support is needed unless otherwise documented below in the visit note.  Continue to take 5 mg daily except 2.5 mg on Wednesdays and Saturdays.    [employer] [facility] [employer] will be the home monitoring service.  Patient's daughter notified that the results must be faxed to Cindy Boyd, RN.  Stacey [name] (dtr) [phone].  Re-check in 1 to 2 weeks.    

## 2018-02-27 DIAGNOSIS — I482 Chronic atrial fibrillation: Secondary | ICD-10-CM | POA: Diagnosis not present

## 2018-02-27 DIAGNOSIS — Z7901 Long term (current) use of anticoagulants: Secondary | ICD-10-CM | POA: Diagnosis not present

## 2018-03-03 ENCOUNTER — Ambulatory Visit (INDEPENDENT_AMBULATORY_CARE_PROVIDER_SITE_OTHER): Payer: Medicare Other | Admitting: General Practice

## 2018-03-03 DIAGNOSIS — I482 Chronic atrial fibrillation, unspecified: Secondary | ICD-10-CM

## 2018-03-03 DIAGNOSIS — Z7901 Long term (current) use of anticoagulants: Secondary | ICD-10-CM | POA: Diagnosis not present

## 2018-03-03 LAB — POCT INR: INR: 2.3 (ref 2.0–3.0)

## 2018-03-03 NOTE — Patient Instructions (Addendum)
Pre visit review using our clinic review tool, if applicable. No additional management support is needed unless otherwise documented below in the visit note.  Continue to take 5 mg daily except 2.5 mg on Wednesdays and Saturdays.    [employer] [facility] will be the home monitoring service.  Patient's daughter notified that the results must be faxed to Cindy Boyd, RN.  Janice (dtr) [phone].  Re-check in 1 to 2 weeks.    

## 2018-03-10 ENCOUNTER — Other Ambulatory Visit: Payer: Self-pay | Admitting: *Deleted

## 2018-03-10 ENCOUNTER — Other Ambulatory Visit: Payer: Self-pay | Admitting: Family Medicine

## 2018-03-10 MED ORDER — RANITIDINE HCL 150 MG PO TABS: ORAL_TABLET | ORAL | 0 refills | Status: DC

## 2018-03-10 NOTE — Telephone Encounter (Signed)
Rx re-sent under Dr Sherlon Handing name as initially sent under Dr Elmyra Ricks.

## 2018-03-16 ENCOUNTER — Other Ambulatory Visit: Payer: Self-pay | Admitting: Family Medicine

## 2018-03-17 ENCOUNTER — Ambulatory Visit (INDEPENDENT_AMBULATORY_CARE_PROVIDER_SITE_OTHER): Payer: Medicare Other | Admitting: General Practice

## 2018-03-17 DIAGNOSIS — Z7901 Long term (current) use of anticoagulants: Secondary | ICD-10-CM

## 2018-03-17 DIAGNOSIS — I482 Chronic atrial fibrillation, unspecified: Secondary | ICD-10-CM

## 2018-03-17 LAB — POCT INR: INR: 2 (ref 2.0–3.0)

## 2018-03-17 NOTE — Patient Instructions (Signed)
Pre visit review using our clinic review tool, if applicable. No additional management support is needed unless otherwise documented below in the visit note.  Continue to take 5 mg daily except 2.5 mg on Wednesdays and Saturdays.    [facility] will be the home monitoring service.  Patient's daughter notified that the results must be faxed to Cindy Boyd, RN.  Janice (dtr) [phone].  Re-check in 1 to 2 weeks.    

## 2018-04-03 ENCOUNTER — Ambulatory Visit (INDEPENDENT_AMBULATORY_CARE_PROVIDER_SITE_OTHER): Payer: Medicare Other | Admitting: General Practice

## 2018-04-03 DIAGNOSIS — I482 Chronic atrial fibrillation, unspecified: Secondary | ICD-10-CM

## 2018-04-03 DIAGNOSIS — Z7901 Long term (current) use of anticoagulants: Secondary | ICD-10-CM

## 2018-04-03 LAB — POCT INR: INR: 2.2 (ref 2.0–3.0)

## 2018-04-03 NOTE — Patient Instructions (Signed)
Pre visit review using our clinic review tool, if applicable. No additional management support is needed unless otherwise documented below in the visit note.  Continue to take 5 mg daily except 2.5 mg on Wednesdays and Saturdays.    [employer] [facility] will be the home monitoring service.  Patient's daughter notified that the results must be faxed to Cindy Mccartney Brucks, RN.  Janice (dtr) [phone].  Re-check in 1 to 2 weeks.    

## 2018-04-16 ENCOUNTER — Other Ambulatory Visit: Payer: Self-pay | Admitting: Family Medicine

## 2018-04-16 DIAGNOSIS — R42 Dizziness and giddiness: Secondary | ICD-10-CM

## 2018-04-16 NOTE — Telephone Encounter (Signed)
Copied from CRM [id number]. Topic: Quick Communication - Rx Refill/Question >> Apr 16, 2018 10:56 AM Tamela Oddi, NT wrote: Medication: meclizine (ANTIVERT) 12.5 MG tablet  Has the patient contacted their pharmacy? No. (Agent: If no, request that the patient contact the pharmacy for the refill.) (Agent: If yes, when and what did the pharmacy advise?)  Preferred Pharmacy (with phone number or street name): [facility] - [place], [place] - [address] [phone] (Phone) [phone] (Fax)    Agent: Please be advised that RX refills may take up to 3 business days. We ask that you follow-up with your pharmacy.

## 2018-04-16 NOTE — Addendum Note (Signed)
Addended by: Lonia Farber E on: 04/16/2018 12:02 PM   Modules accepted: Orders

## 2018-04-16 NOTE — Telephone Encounter (Signed)
Since last week pt stated she gets nauseated and then the nausea goes away after half hour after eating. Yesterday pt stated she had morning nausea and felt tired and then after eating the nausea went away. Pt stated that she heard that meclizine was good for nausea. Pt stated she took a dose at 0915 this am and it helped.  LR: 09/30/17 #30 LOV: 09/30/17

## 2018-04-16 NOTE — Telephone Encounter (Signed)
Patient called [phone] and her daughter [name] answered the phone and says to call her mother on the [phone] number. The number called, left VM to return call to the office to discuss the symptoms requiring Meclizine refill.

## 2018-04-17 ENCOUNTER — Other Ambulatory Visit: Payer: Self-pay

## 2018-04-17 ENCOUNTER — Ambulatory Visit (INDEPENDENT_AMBULATORY_CARE_PROVIDER_SITE_OTHER): Payer: Medicare Other | Admitting: General Practice

## 2018-04-17 DIAGNOSIS — R42 Dizziness and giddiness: Secondary | ICD-10-CM

## 2018-04-17 DIAGNOSIS — I482 Chronic atrial fibrillation, unspecified: Secondary | ICD-10-CM

## 2018-04-17 DIAGNOSIS — Z7901 Long term (current) use of anticoagulants: Secondary | ICD-10-CM

## 2018-04-17 LAB — POCT INR: INR: 1.9 — AB (ref 2.0–3.0)

## 2018-04-17 MED ORDER — MECLIZINE HCL 12.5 MG PO TABS: 12.5000 mg | ORAL_TABLET | Freq: Three times a day (TID) | ORAL | 0 refills | Status: DC | PRN

## 2018-04-17 NOTE — Patient Instructions (Signed)
Pre visit review using our clinic review tool, if applicable. No additional management support is needed unless otherwise documented below in the visit note.  Take 1 1/2 tablets today and then continue to take 5 mg daily except 2.5 mg on Wednesdays and Saturdays.    [employer] [facility] [employer] will be the home monitoring service.  Patient's daughter notified that the results must be faxed to Bailey Mech, RN.  [name] (dtr) [phone].  Re-check in 1 to 2 weeks.

## 2018-04-17 NOTE — Telephone Encounter (Signed)
Pt refill for Meclizine was sent to pharmacy per pt, pt daughter states that's patient needs something for her nausea.

## 2018-04-21 NOTE — Telephone Encounter (Signed)
Per phone notes, pt requested meclizine as she stated it helped her nausea.  Is this not the case?  Is pt still taking prilosec/ other meds for GERD?  Also if pt is have continue nausea she may need to be evaluated.

## 2018-04-22 NOTE — Telephone Encounter (Signed)
Spoke with pt daughter stated that pt feels a little better after taking the Meclizine, states that she will give the Rx sometime to work and doesn't feel better she will schedule an appointment for her.

## 2018-04-27 ENCOUNTER — Telehealth: Payer: Self-pay | Admitting: Family Medicine

## 2018-04-27 NOTE — Telephone Encounter (Unsigned)
Copied from CRM [id number]. Topic: Quick Communication - Rx Refill/Question >> Apr 27, 2018  4:20 PM Gaynelle Adu wrote: Medication: prednisoLONE 5 MG TABS tablet, traMADol (ULTRAM) 50 MG tablet  Has the patient contacted their pharmacy? No   Preferred Pharmacy (with phone number or street name):    Patient stated she called on 04-14-18, and she is now low. She is requesting 90 day for both medication   Please advise

## 2018-04-28 ENCOUNTER — Other Ambulatory Visit: Payer: Self-pay | Admitting: Family Medicine

## 2018-04-28 DIAGNOSIS — M150 Primary generalized (osteo)arthritis: Principal | ICD-10-CM

## 2018-04-28 DIAGNOSIS — M159 Polyosteoarthritis, unspecified: Secondary | ICD-10-CM

## 2018-04-28 DIAGNOSIS — M8949 Other hypertrophic osteoarthropathy, multiple sites: Secondary | ICD-10-CM

## 2018-04-28 NOTE — Telephone Encounter (Signed)
tramadol refill Last Refill:12/10/17 # 60 Last OV: 12/10/17 PCP: Dr Salomon Fick Pharmacy:[facility]   Prednisolone refill Last Refill:01/19/18 # 90 2 RF Last OV: 12/10/17

## 2018-04-28 NOTE — Telephone Encounter (Signed)
Copied from CRM [id number]. Topic: Quick Communication - Rx Refill/Question >> Apr 28, 2018 10:57 AM Marylen Ponto wrote: [facility] - [place], [place] - [address]  [phone] (Phone)   [phone] (Fax) requests refill on the following medications with a 90 day supply with 3 refills  Medication: traMADol (ULTRAM) 50 MG tablet and prednisoLONE 5 MG TABS tablet  Has the patient contacted their pharmacy? yes  Preferred Pharmacy (with phone number or street name): [facility] - [place], [place] - [address]  [phone] (Phone)   [phone] (Fax)  Agent: Please be advised that RX refills may take up to 3 business days. We ask that you follow-up with your pharmacy.

## 2018-04-28 NOTE — Telephone Encounter (Signed)
Spoke with pt daughter voiced understanding that pt has refills left at the pharmacy. Pt requests a refill for Tramadol, please Advise.

## 2018-04-28 NOTE — Telephone Encounter (Signed)
Spoke with pt Daughter, explained that Prednisolone has more refills in the pharmacy. Pt daughter voiced understanding that Dr Salomon Fick is out of the office and that tramadol Rx will be refilled when the Dr returns in the office. Pt requests for refills on Tramadol, please advise

## 2018-04-29 MED ORDER — TRAMADOL HCL 50 MG PO TABS: 100.0000 mg | ORAL_TABLET | Freq: Three times a day (TID) | ORAL | 0 refills | Status: DC | PRN

## 2018-04-29 MED ORDER — PREDNISOLONE 5 MG PO TABS: 5.0000 mg | ORAL_TABLET | Freq: Every day | ORAL | 2 refills | Status: DC

## 2018-04-29 NOTE — Telephone Encounter (Signed)
Med refilled.

## 2018-04-30 DIAGNOSIS — Z7901 Long term (current) use of anticoagulants: Secondary | ICD-10-CM | POA: Diagnosis not present

## 2018-04-30 DIAGNOSIS — I482 Chronic atrial fibrillation: Secondary | ICD-10-CM | POA: Diagnosis not present

## 2018-05-01 ENCOUNTER — Ambulatory Visit (INDEPENDENT_AMBULATORY_CARE_PROVIDER_SITE_OTHER): Payer: Medicare Other | Admitting: General Practice

## 2018-05-01 DIAGNOSIS — I482 Chronic atrial fibrillation, unspecified: Secondary | ICD-10-CM

## 2018-05-01 DIAGNOSIS — Z7901 Long term (current) use of anticoagulants: Secondary | ICD-10-CM

## 2018-05-04 LAB — POCT INR: INR: 2.3 (ref 2.0–3.0)

## 2018-05-08 ENCOUNTER — Telehealth: Payer: Self-pay | Admitting: Family Medicine

## 2018-05-08 NOTE — Telephone Encounter (Signed)
Copied from CRM [id number]. Topic: Referral - Request >> May 08, 2018 10:16 AM Leafy Ro wrote: Reason for CRM: pt would like a referral to dr Zenovia Jordan phone [phone] for rheumatoid arthritis for pain all over

## 2018-05-08 NOTE — Telephone Encounter (Signed)
That's fine.  Pt was seen in the past by Rheum in [place] and started on prednisone 5 mg daily for OA of multiple sites.

## 2018-05-08 NOTE — Telephone Encounter (Signed)
Please Advise if ok 

## 2018-05-11 ENCOUNTER — Other Ambulatory Visit: Payer: Self-pay | Admitting: Family Medicine

## 2018-05-11 DIAGNOSIS — M069 Rheumatoid arthritis, unspecified: Secondary | ICD-10-CM

## 2018-05-11 NOTE — Telephone Encounter (Signed)
Referral to Rheumatology has been placed per Dr Salomon Fick

## 2018-05-12 ENCOUNTER — Other Ambulatory Visit: Payer: Self-pay | Admitting: Family Medicine

## 2018-05-12 DIAGNOSIS — M8949 Other hypertrophic osteoarthropathy, multiple sites: Secondary | ICD-10-CM

## 2018-05-12 DIAGNOSIS — M159 Polyosteoarthritis, unspecified: Secondary | ICD-10-CM

## 2018-05-12 DIAGNOSIS — M150 Primary generalized (osteo)arthritis: Principal | ICD-10-CM

## 2018-05-12 NOTE — Telephone Encounter (Signed)
Patient called and spoke to her daughter about the refill requests, the patient says she never received the Prednisone prescription sent on 04/29/18. She says something is coming from [facility] today, but doesn't know what. She asks about the amount of refills on Tramadol, I advised it is no refills and the sig is to take 2 pills every 8 hours prn severe pain #60 pills, she verbalized understanding. She asks if the Tramadol can be ordered for a 90 day supply, her mom will not have to be without it while waiting on the refills to come in. I advised she could try a local pharmacy as an option, but I will send this request to Dr. Salomon Fick for consideration, she verbalized understanding.   [facility] called and spoke to Parker, PCA who says the Prednisolone prescription was not received on 04/29/18, Tramadol was received on 04/29/18 and sent to the patient. He says what is being delivered today is Atorvastatin. He says a refill request for Tramadol and Prednisone was faxed to the office and they are waiting for the refill.

## 2018-05-12 NOTE — Telephone Encounter (Signed)
Please advise 

## 2018-05-12 NOTE — Telephone Encounter (Signed)
Copied from CRM [id number]. Topic: Quick Communication - Rx Refill/Question >> Apr 28, 2018 10:57 AM Marylen Ponto wrote: [facility] - [place], [place] - [address]  [phone] (Phone)   [phone] (Fax) requests refill on the following medications with a 90 day supply with 3 refills  Medication: traMADol (ULTRAM) 50 MG tablet and prednisoLONE 5 MG TABS tablet  Has the patient contacted their pharmacy? yes  Preferred Pharmacy (with phone number or street name): [facility] - [place], [place] - [address]  [phone] (Phone)   [phone] (Fax)  Agent: Please be advised that RX refills may take up to 3 business days. We ask that you follow-up with your pharmacy. >> May 12, 2018 12:19 PM Waymon Amato wrote: [facility] morning and they do not have the rx for the prednisone it shows in the chart that it was escribed on 04/29/18 at 1257pm  Can this be resent   Best nuimber [phone] please call patient once has been resent

## 2018-05-15 LAB — POCT INR: INR: 2.1 (ref 2.0–3.0)

## 2018-05-15 MED ORDER — PREDNISOLONE 5 MG PO TABS: 5.0000 mg | ORAL_TABLET | Freq: Every day | ORAL | 2 refills | Status: DC

## 2018-05-15 MED ORDER — TRAMADOL HCL 50 MG PO TABS: 100.0000 mg | ORAL_TABLET | Freq: Three times a day (TID) | ORAL | 0 refills | Status: DC | PRN

## 2018-05-15 NOTE — Telephone Encounter (Signed)
Spoke with pt daughter stated that she will ask pt to call the office and confirm if she has received the Rx sent to the pharmacy on 04/29/2018

## 2018-05-15 NOTE — Telephone Encounter (Signed)
Was informed this pt would like to speak only to Stacey [name].

## 2018-05-15 NOTE — Telephone Encounter (Signed)
Pt calling back. Would like a call back from nancy. Please advise

## 2018-05-18 ENCOUNTER — Telehealth: Payer: Self-pay | Admitting: Family Medicine

## 2018-05-18 NOTE — Telephone Encounter (Signed)
Received fax from [facility] reporting INR of 2.1 on 05/15/18. INR range for patient is 2-3. Copy left for [facility].

## 2018-05-19 DIAGNOSIS — I739 Peripheral vascular disease, unspecified: Secondary | ICD-10-CM | POA: Diagnosis not present

## 2018-05-19 DIAGNOSIS — L84 Corns and callosities: Secondary | ICD-10-CM | POA: Diagnosis not present

## 2018-05-19 DIAGNOSIS — L602 Onychogryphosis: Secondary | ICD-10-CM | POA: Diagnosis not present

## 2018-05-19 NOTE — Telephone Encounter (Signed)
Called pt back, left a message to return my call in the office

## 2018-05-20 ENCOUNTER — Encounter: Payer: Self-pay | Admitting: Family Medicine

## 2018-05-20 ENCOUNTER — Ambulatory Visit (INDEPENDENT_AMBULATORY_CARE_PROVIDER_SITE_OTHER): Payer: Medicare Other | Admitting: Family Medicine

## 2018-05-20 VITALS — BP 108/78 | HR 72 | Temp 98.1°F | Wt 112.0 lb

## 2018-05-20 DIAGNOSIS — M159 Polyosteoarthritis, unspecified: Secondary | ICD-10-CM

## 2018-05-20 DIAGNOSIS — M8949 Other hypertrophic osteoarthropathy, multiple sites: Secondary | ICD-10-CM

## 2018-05-20 DIAGNOSIS — R195 Other fecal abnormalities: Secondary | ICD-10-CM | POA: Diagnosis not present

## 2018-05-20 DIAGNOSIS — M150 Primary generalized (osteo)arthritis: Secondary | ICD-10-CM

## 2018-05-20 MED ORDER — TRAMADOL HCL 50 MG PO TABS: 100.0000 mg | ORAL_TABLET | Freq: Four times a day (QID) | ORAL | 3 refills | Status: DC | PRN

## 2018-05-20 MED ORDER — PREDNISONE 5 MG PO TABS: 5.0000 mg | ORAL_TABLET | Freq: Every day | ORAL | 3 refills | Status: DC

## 2018-05-20 MED ORDER — PREDNISONE 5 MG PO TABS: 5.0000 mg | ORAL_TABLET | Freq: Every day | ORAL | 0 refills | Status: DC

## 2018-05-20 MED ORDER — TRAMADOL HCL 50 MG PO TABS: 100.0000 mg | ORAL_TABLET | Freq: Four times a day (QID) | ORAL | 2 refills | Status: DC | PRN

## 2018-05-20 NOTE — Patient Instructions (Signed)
Food Choices to Help Relieve Diarrhea, Adult  When you have diarrhea, the foods you eat and your eating habits are very important. Choosing the right foods and drinks can help:  · Relieve diarrhea.  · Replace lost fluids and nutrients.  · Prevent dehydration.    What general guidelines should I follow?  Relieving diarrhea  · Choose foods with less than 2 g or .07 oz. of fiber per serving.  · Limit fats to less than 8 tsp (38 g or 1.34 oz.) a day.  · Avoid the following:  ? Foods and beverages sweetened with high-fructose corn syrup, honey, or sugar alcohols such as xylitol, sorbitol, and mannitol.  ? Foods that contain a lot of fat or sugar.  ? Fried, greasy, or spicy foods.  ? High-fiber grains, breads, and cereals.  ? Raw fruits and vegetables.  · Eat foods that are rich in probiotics. These foods include dairy products such as yogurt and fermented milk products. They help increase healthy bacteria in the stomach and intestines (gastrointestinal tract, or GI tract).  · If you have lactose intolerance, avoid dairy products. These may make your diarrhea worse.  · Take medicine to help stop diarrhea (antidiarrheal medicine) only as told by your health care provider.  Replacing nutrients  · Eat small meals or snacks every 3–4 hours.  · Eat bland foods, such as white rice, toast, or baked potato, until your diarrhea starts to get better. Gradually reintroduce nutrient-rich foods as tolerated or as told by your health care provider. This includes:  ? Well-cooked protein foods.  ? Peeled, seeded, and soft-cooked fruits and vegetables.  ? Low-fat dairy products.  · Take vitamin and mineral supplements as told by your health care provider.  Preventing dehydration    · Start by sipping water or a special solution to prevent dehydration (oral rehydration solution, ORS). Urine that is clear or pale yellow means that you are getting enough fluid.  · Try to drink at least 8–10 cups of fluid each day to help replace lost  fluids.  · You may add other liquids in addition to water, such as clear juice or decaffeinated sports drinks, as tolerated or as told by your health care provider.  · Avoid drinks with caffeine, such as coffee, tea, or soft drinks.  · Avoid alcohol.  What foods are recommended?  The items listed may not be a complete list. Talk with your health care provider about what dietary choices are best for you.  Grains  White rice. White, French, or pita breads (fresh or toasted), including plain rolls, buns, or bagels. White pasta. Saltine, soda, or graham crackers. Pretzels. Low-fiber cereal. Cooked cereals made with water (such as cornmeal, farina, or cream cereals). Plain muffins. Matzo. Melba toast. Zwieback.  Vegetables  Potatoes (without the skin). Most well-cooked and canned vegetables without skins or seeds. Tender lettuce.  Fruits  Apple sauce. Fruits canned in juice. Cooked apricots, cherries, grapefruit, peaches, pears, or plums. Fresh bananas and cantaloupe.  Meats and other protein foods  Baked or boiled chicken. Eggs. Tofu. Fish. Seafood. Smooth nut butters. Ground or well-cooked tender beef, ham, veal, lamb, pork, or poultry.  Dairy  Plain yogurt, kefir, and unsweetened liquid yogurt. Lactose-free milk, buttermilk, skim milk, or soy milk. Low-fat or nonfat hard cheese.  Beverages  Water. Low-calorie sports drinks. Fruit juices without pulp. Strained tomato and vegetable juices. Decaffeinated teas. Sugar-free beverages not sweetened with sugar alcohols. Oral rehydration solutions, if approved by your health care   provider.  Seasoning and other foods  Bouillon, broth, or soups made from recommended foods.  What foods are not recommended?  The items listed may not be a complete list. Talk with your health care provider about what dietary choices are best for you.  Grains  Whole grain, whole wheat, bran, or rye breads, rolls, pastas, and crackers. Wild or brown rice. Whole grain or bran cereals. Barley. Oats  and oatmeal. Corn tortillas or taco shells. Granola. Popcorn.  Vegetables  Raw vegetables. Fried vegetables. Cabbage, broccoli, Brussels sprouts, artichokes, baked beans, beet greens, corn, kale, legumes, peas, sweet potatoes, and yams. Potato skins. Cooked spinach and cabbage.  Fruits  Dried fruit, including raisins and dates. Raw fruits. Stewed or dried prunes. Canned fruits with syrup.  Meat and other protein foods  Fried or fatty meats. Deli meats. Chunky nut butters. Nuts and seeds. Beans and lentils. Bacon. Hot dogs. Sausage.  Dairy  High-fat cheeses. Whole milk, chocolate milk, and beverages made with milk, such as milk shakes. Half-and-half. Cream. sour cream. Ice cream.  Beverages  Caffeinated beverages (such as coffee, tea, soda, or energy drinks). Alcoholic beverages. Fruit juices with pulp. Prune juice. Soft drinks sweetened with high-fructose corn syrup or sugar alcohols. High-calorie sports drinks.  Fats and oils  Butter. Cream sauces. Margarine. Salad oils. Plain salad dressings. Olives. Avocados. Mayonnaise.  Sweets and desserts  Sweet rolls, doughnuts, and sweet breads. Sugar-free desserts sweetened with sugar alcohols such as xylitol and sorbitol.  Seasoning and other foods  Honey. Hot sauce. Chili powder. Gravy. Cream-based or milk-based soups. Pancakes and waffles.  Summary  · When you have diarrhea, the foods you eat and your eating habits are very important.  · Make sure you get at least 8–10 cups of fluid each day, or enough to keep your urine clear or pale yellow.  · Eat bland foods and gradually reintroduce healthy, nutrient-rich foods as tolerated, or as told by your health care provider.  · Avoid high-fiber, fried, greasy, or spicy foods.  This information is not intended to replace advice given to you by your health care provider. Make sure you discuss any questions you have with your health care provider.  Document Released: 10/19/2003 Document Revised: 07/26/2016 Document  Reviewed: 07/26/2016  Elsevier Interactive Patient Education © 2018 Elsevier Inc.

## 2018-05-20 NOTE — Progress Notes (Signed)
Subjective:    Patient ID: Stacey [name], female    DOB: 1933/08/12, 84 y.o.   MRN: [id number]  No chief complaint on file. Pt is accompanied by her caregiver, Myra.  HPI Patient was seen today for acute concern. Pt endorses 1 loose stool per wk.  At times stool is green in color, but then returns to formed normal color.  States her diet has not changed.  Eating rye bread in the evening.  Started taking a probiotic which helped decreased flatus.  Stools are non-malodorus, no recent abx use.   Pt notes she has been out of prednisone 5 mg for almost 1 month.  Pt has been taking low dose daily prednisone as rx'd by her rheumatologist.  She is also taking Tramadol, but states the 50 mg BID is not enough.  She is having to take tylenol in between doses to help.  Endorses pain in hands, knees, back, and neck.  In the past pt was on Tramadol 100 mg TID per her rheumatologist.  Past Medical History:  Diagnosis Date  . Atrial fibrillation (HCC)   . Hernia of abdominal cavity   . Kyphosis     Allergies  Allergen Reactions  . Sulfa Antibiotics Anaphylaxis  . Codeine Nausea And Vomiting  . Latex Rash  . Neosporin Original [Bacitracin-Neomycin-Polymyxin] Rash  . Polysporin [Bacitracin-Polymyxin B] Rash    ROS General: Denies fever, chills, night sweats, changes in weight, changes in appetite HEENT: Denies headaches, ear pain, changes in vision, rhinorrhea, sore throat CV: Denies CP, palpitations, SOB, orthopnea Pulm: Denies SOB, cough, wheezing GI: Denies abdominal pain, nausea, vomiting, diarrhea, constipation  + 1 loose stool/wk GU: Denies dysuria, hematuria, frequency, vaginal discharge Msk: Denies muscle cramps  +h/o OA joint pains Neuro: Denies weakness, numbness, tingling Skin: Denies rashes, bruising Psych: Denies depression, anxiety, hallucinations     Objective:    Blood pressure 108/78, pulse 72, temperature 98.1 F (36.7 C), temperature source Oral, weight 112 lb (50.8  kg), SpO2 95 %.   Gen. Pleasant, well-nourished, in no distress, normal affect   HEENT: [place]/AT, face symmetric, no scleral icterus, PERRLA, nares patent without drainage, pharynx without erythema or exudate. Lungs: no accessory muscle use, CTAB, no wheezes or rales Cardiovascular: RRR, no m/r/g, no peripheral edema Abd: BS present, ND/NT Musculoskeletal: kyphosis, deformities of joints of fingers b/l, no cyanosis or clubbing, normal tone Neuro:  A&Ox3, CN II-XII intact, sitting in wheelchair has cane with her. Gait not assessed.  Wt Readings from Last 3 Encounters:  05/20/18 112 lb (50.8 kg)  02/05/18 108 lb (49 kg)  01/24/18 108 lb (49 kg)    Lab Results  Component Value Date   WBC 6.9 01/24/2018   HGB 13.5 01/24/2018   HCT 41.5 01/24/2018   PLT 248 01/24/2018   GLUCOSE 104 (H) 01/24/2018   ALT 18 07/28/2017   AST 34 07/28/2017   NA 136 01/24/2018   K 3.4 (L) 01/24/2018   CL 96 (L) 01/24/2018   CREATININE 0.88 01/24/2018   BUN 13 01/24/2018   CO2 31 01/24/2018   INR 2.3 05/04/2018    Assessment/Plan:  Loose stools -pt reassured.  Discussed 1 loose stool per wk is not diarrhea. -continue using probiotic, increase fiber intake. -given handout  Primary osteoarthritis involving multiple joints -will send in 30 d rx for prednisone to local pharmacy  -will send tramadol at increased dose and prednisone to pt's mail order pharmacy. -initial rheumatology referral denied.  2nd referral still pending. -  Plan: traMADol (ULTRAM) 50 MG tablet, predniSONE (DELTASONE) 5 MG tablet, predniSONE (DELTASONE) 5 MG tablet   F/u in 3 months, sooner if needed  Abbe Amsterdam, MD

## 2018-05-21 NOTE — Telephone Encounter (Signed)
Pt was seen at the office on 05/20/2018 and is aware that her referral is on review.

## 2018-05-26 ENCOUNTER — Other Ambulatory Visit: Payer: Self-pay | Admitting: Family Medicine

## 2018-05-26 ENCOUNTER — Ambulatory Visit (INDEPENDENT_AMBULATORY_CARE_PROVIDER_SITE_OTHER): Payer: Medicare Other | Admitting: General Practice

## 2018-05-26 DIAGNOSIS — Z7901 Long term (current) use of anticoagulants: Secondary | ICD-10-CM

## 2018-05-26 NOTE — Progress Notes (Signed)
Cardiology Office Note:    Date:  05/27/2018   ID:  Stacey [name], [name] [birth year], MRN [id number]  PCP:  [name], MD  Cardiologist:  Lesleigh Noe, MD   Referring MD: [name], MD   Chief Complaint  Patient presents with  . Atrial Fibrillation  . Congestive Heart Failure    History of Present Illness:    Stacey [name] is a 84 y.o. female with a hx of evaluation and management of chronic atrial fibrillation and "congestive heart failure".  We were unable to get much information from Dr. Wyn [name] in [place] concerning heart failure diagnosis.  We have no idea of LV systolic function.  She denies chest pain.  She has more dyspnea on exertion now than she did 6 months ago.  She is unable to lie flat because of shortness of breath.  5 or 6 months ago her diuretic regimen was decreased by an emergency room physician when she presented with chest pain and was noted to have hypokalemia.  Torsemide was decreased from 40 to 20 mg/day.  She also takes spironolactone 12.5 mg daily.  Past Medical History:  Diagnosis Date  . Atrial fibrillation (HCC)   . Hernia of abdominal cavity   . Kyphosis     Past Surgical History:  Procedure Laterality Date  . ABDOMINAL HYSTERECTOMY    . TONSILLECTOMY      Current Medications: Current Meds  Medication Sig  . atorvastatin (LIPITOR) 10 MG tablet Take 1 tablet (10 mg total) by mouth daily.  . cyanocobalamin 1000 MCG tablet Take 1,000 mcg by mouth daily.  Marland Kitchen desloratadine (CLARINEX) 5 MG tablet Take 5 mg by mouth daily.  Marland Kitchen gabapentin (NEURONTIN) 400 MG capsule Take 1 capsule (400 mg total) by mouth 3 (three) times daily.  . meclizine (ANTIVERT) 12.5 MG tablet Take 1 tablet (12.5 mg total) by mouth 3 (three) times daily as needed for dizziness.  . metoprolol tartrate (LOPRESSOR) 100 MG tablet Take 1 tablet (100 mg total) by mouth 2 (two) times daily.  . Multiple Vitamin (MULTIVITAMIN) capsule Take 1 capsule by mouth daily.    Marland Kitchen omeprazole (PRILOSEC) 20 MG capsule Take 1 capsule (20 mg total) by mouth daily.  . prednisoLONE 5 MG TABS tablet Take 1 tablet (5 mg total) by mouth daily.  . ranitidine (ZANTAC) 150 MG tablet Take 150 mg by mouth 2 (two) times daily as needed for heartburn.  . sertraline (ZOLOFT) 100 MG tablet Take 1 tablet (100 mg total) by mouth daily.  Marland Kitchen spironolactone (ALDACTONE) 25 MG tablet Take 0.5 tablets (12.5 mg total) by mouth daily.  Marland Kitchen torsemide (DEMADEX) 20 MG tablet Take 1 tablet (20 mg total) by mouth daily.  . traMADol (ULTRAM) 50 MG tablet Take 100 mg by mouth every 6 (six) hours as needed for moderate pain or severe pain.  Marland Kitchen VITAMIN D, CHOLECALCIFEROL, PO Take 1,000 Units by mouth daily.   . [DISCONTINUED] torsemide (DEMADEX) 20 MG tablet Take 2 tablets (40 mg total) by mouth daily.     Allergies:   Sulfa antibiotics; Codeine; Latex; Neosporin original [bacitracin-neomycin-polymyxin]; and Polysporin [bacitracin-polymyxin b]   Social History   Socioeconomic History  . Marital status: Widowed    Spouse name: Not on file  . Number of children: Not on file  . Years of education: Not on file  . Highest education level: Not on file  Occupational History  . Not on file  Social Needs  . Financial resource strain:  Not on file  . Food insecurity:    Worry: Not on file    Inability: Not on file  . Transportation needs:    Medical: Not on file    Non-medical: Not on file  Tobacco Use  . Smoking status: Former Games developer  . Smokeless tobacco: Never Used  Substance and Sexual Activity  . Alcohol use: No    Frequency: Never  . Drug use: No  . Sexual activity: Not on file  Lifestyle  . Physical activity:    Days per week: Not on file    Minutes per session: Not on file  . Stress: Not on file  Relationships  . Social connections:    Talks on phone: Not on file    Gets together: Not on file    Attends religious service: Not on file    Active member of club or organization: Not on  file    Attends meetings of clubs or organizations: Not on file    Relationship status: Not on file  Other Topics Concern  . Not on file  Social History Narrative  . Not on file     Family History: The patient's family history is not on file.  ROS:   Please see the history of present illness.    Anxiety, dizziness, diarrhea, cough, hiatal hernia, difficulty with hearing, vision disturbance, leg swelling, fatigue, leg pain, snoring, excessive daytime sleepiness, anxiety, joint swelling, difficulty with balance.  All other systems reviewed and are negative.  EKGs/Labs/Other Studies Reviewed:    The following studies were reviewed today: No new data  EKG:  EKG is not ordered today.  Recent Labs: 07/28/2017: ALT 18 01/24/2018: BUN 13; Creatinine, Ser 0.88; Hemoglobin 13.5; Platelets 248; Potassium 3.4; Sodium 136  Recent Lipid Panel No results found for: CHOL, TRIG, HDL, CHOLHDL, VLDL, LDLCALC, LDLDIRECT  Physical Exam:    VS:  BP 112/78   Ht 5\' 3"  (1.6 m)   Wt 113 lb 12.8 oz (51.6 kg)   BMI 20.16 kg/m     Wt Readings from Last 3 Encounters:  05/27/18 113 lb 12.8 oz (51.6 kg)  05/20/18 112 lb (50.8 kg)  02/05/18 108 lb (49 kg)     GEN: Lillian frail.  Well  developed in no acute distress HEENT: Normal NECK: No JVD. LYMPHATICS: No lymphadenopathy CARDIAC: IIRR, no murmur, np gallop, no  edema. VASCULAR: 2+ bilateral radial pulses.  No bruits. RESPIRATORY:  Clear to auscultation without rales, wheezing or rhonchi  ABDOMEN: Soft, non-tender, non-distended, No pulsatile mass, MUSCULOSKELETAL: No deformity  SKIN: Warm and dry NEUROLOGIC:  Alert and oriented x 3 PSYCHIATRIC:  Normal affect   ASSESSMENT:    1. Chronic atrial fibrillation   2. Dyspnea, unspecified type   3. Congestive heart failure, unspecified HF chronicity, unspecified heart failure type (HCC)   4. Long term (current) use of anticoagulants [Z79.01]    PLAN:    In order of problems listed  above:  1. Controlled rate.  No change in current management. 2. Etiology of dyspnea is unclear.  She does have elevated neck veins, trace edema, and a history of heart failure.  Dyspnea could be related to volume overload especially since her diuretic regimen was decreased in intensity 6 months ago which coincides with the gradual increase in shortness of breath.  Plan 2D Doppler echocardiogram and check BNP today. 3. Clarify type of heart failure with echocardiogram. 4. Continue anticoagulation therapy.  Plan to assess LV function with echo and also do  BNP, CBC, and electrolytes to investigate dyspnea.  Further evaluation/management will be dependent upon findings.  If everything looks okay, she will follow-up in 6 months with a team member and with me in 1 year.  Greater than 50% of the time was spent in coordination of care and education concerning disease process.   Medication Adjustments/Labs and Tests Ordered: Current medicines are reviewed at length with the patient today.  Concerns regarding medicines are outlined above.  Orders Placed This Encounter  Procedures  . Basic metabolic panel  . Pro b natriuretic peptide  . CBC  . ECHOCARDIOGRAM COMPLETE   Meds ordered this encounter  Medications  . torsemide (DEMADEX) 20 MG tablet    Sig: Take 1 tablet (20 mg total) by mouth daily.    Dispense:  180 tablet    Refill:  2    Patient Instructions  Your physician recommends that you continue on your current medications as directed. Please refer to the Current Medication list given to you today.  Your physician recommends that you return for lab work in:  TODAY BMET ,BNP , AND CBC  Your physician has requested that you have an echocardiogram. Echocardiography is a painless test that uses sound waves to create images of your heart. It provides your doctor with information about the size and shape of your heart and how well your heart's chambers and valves are working. This procedure  takes approximately one hour. There are no restrictions for this procedure.   Your physician wants you to follow-up in: 6 MONTHS WITH APP AND 1 YEAR WITH DR Marlou Starks will receive a reminder letter in the mail two months in advance. If you don't receive a letter, please call our office to schedule the follow-up appointment.     Signed, Lesleigh Noe, MD  05/27/2018 11:28 AM    [facility]

## 2018-05-26 NOTE — Patient Instructions (Signed)
Pre visit review using our clinic review tool, if applicable. No additional management support is needed unless otherwise documented below in the visit note.  Continue to take 5 mg daily except 2.5 mg on Wednesdays and Saturdays.    [facility] will be the home monitoring service.  Patient's daughter notified that the results must be faxed to Bailey Mech, RN.  [name] (dtr) [phone].  Re-check in 1 to 2 weeks.  This was addressed on 10/4.

## 2018-05-27 ENCOUNTER — Ambulatory Visit (INDEPENDENT_AMBULATORY_CARE_PROVIDER_SITE_OTHER): Payer: Medicare Other | Admitting: Interventional Cardiology

## 2018-05-27 ENCOUNTER — Encounter: Payer: Self-pay | Admitting: Interventional Cardiology

## 2018-05-27 VITALS — BP 112/78 | Ht 63.0 in | Wt 113.8 lb

## 2018-05-27 DIAGNOSIS — I482 Chronic atrial fibrillation, unspecified: Secondary | ICD-10-CM | POA: Diagnosis not present

## 2018-05-27 DIAGNOSIS — I509 Heart failure, unspecified: Secondary | ICD-10-CM | POA: Diagnosis not present

## 2018-05-27 DIAGNOSIS — R0600 Dyspnea, unspecified: Secondary | ICD-10-CM

## 2018-05-27 DIAGNOSIS — Z7901 Long term (current) use of anticoagulants: Secondary | ICD-10-CM

## 2018-05-27 LAB — BASIC METABOLIC PANEL
BUN/Creatinine Ratio: 27 (ref 12–28)
BUN: 20 mg/dL (ref 8–27)
CO2: 25 mmol/L (ref 20–29)
CREATININE: 0.75 mg/dL (ref 0.57–1.00)
Calcium: 9.2 mg/dL (ref 8.7–10.3)
Chloride: 96 mmol/L (ref 96–106)
GFR calc Af Amer: 85 mL/min/{1.73_m2} (ref >59)
GFR calc non Af Amer: 74 mL/min/{1.73_m2} (ref >59)
GLUCOSE: 84 mg/dL (ref 65–99)
Potassium: 4.1 mmol/L (ref 3.5–5.2)
SODIUM: 139 mmol/L (ref 134–144)

## 2018-05-27 LAB — PRO B NATRIURETIC PEPTIDE: NT-PRO BNP: 2827 pg/mL — AB (ref 0–738)

## 2018-05-27 LAB — CBC
HEMATOCRIT: 33 % — AB (ref 34.0–46.6)
Hemoglobin: 11 g/dL — ABNORMAL LOW (ref 11.1–15.9)
MCH: 28.7 pg (ref 26.6–33.0)
MCHC: 33.3 g/dL (ref 31.5–35.7)
MCV: 86 fL (ref 79–97)
Platelets: 250 10*3/uL (ref 150–450)
RBC: 3.83 x10E6/uL (ref 3.77–5.28)
RDW: 13.2 % (ref 12.3–15.4)
WBC: 5.9 10*3/uL (ref 3.4–10.8)

## 2018-05-27 MED ORDER — TORSEMIDE 20 MG PO TABS: 20.0000 mg | ORAL_TABLET | Freq: Every day | ORAL | 2 refills | Status: DC

## 2018-05-27 NOTE — Patient Instructions (Addendum)
Your physician recommends that you continue on your current medications as directed. Please refer to the Current Medication list given to you today.  Your physician recommends that you return for lab work in:  TODAY BMET ,BNP , AND CBC  Your physician has requested that you have an echocardiogram. Echocardiography is a painless test that uses sound waves to create images of your heart. It provides your doctor with information about the size and shape of your heart and how well your heart's chambers and valves are working. This procedure takes approximately one hour. There are no restrictions for this procedure.   Your physician wants you to follow-up in: 6 MONTHS WITH APP AND 1 YEAR WITH DR Marlou Starks will receive a reminder letter in the mail two months in advance. If you don't receive a letter, please call our office to schedule the follow-up appointment.

## 2018-06-01 ENCOUNTER — Telehealth: Payer: Self-pay

## 2018-06-01 DIAGNOSIS — I509 Heart failure, unspecified: Secondary | ICD-10-CM

## 2018-06-01 MED ORDER — TORSEMIDE 20 MG PO TABS: 40.0000 mg | ORAL_TABLET | Freq: Every day | ORAL | 3 refills | Status: DC

## 2018-06-01 NOTE — Telephone Encounter (Signed)
Informed patient and her daughter of results and verbal understanding expressed.  Instructed her to INCREASE TORSEMIDE to 40 mg daily. She will have BMET and BNP drawn when she comes for her echo 10/30. She was grateful for call and agrees with treatment plan.

## 2018-06-01 NOTE — Telephone Encounter (Signed)
-----   Message from Lyn Records, MD sent at 05/31/2018 10:10 PM EDT ----- Let the patient know she should increase torsemide to 40 mg daily. BMET 2 weeks and repeat BNP. A copy will be sent to [name], MD

## 2018-06-10 ENCOUNTER — Telehealth: Payer: Self-pay

## 2018-06-10 ENCOUNTER — Other Ambulatory Visit: Payer: Medicare Other | Admitting: *Deleted

## 2018-06-10 ENCOUNTER — Other Ambulatory Visit: Payer: Self-pay

## 2018-06-10 ENCOUNTER — Ambulatory Visit (HOSPITAL_COMMUNITY): Payer: Medicare Other | Attending: Cardiovascular Disease

## 2018-06-10 DIAGNOSIS — I509 Heart failure, unspecified: Secondary | ICD-10-CM | POA: Diagnosis not present

## 2018-06-10 DIAGNOSIS — E876 Hypokalemia: Secondary | ICD-10-CM

## 2018-06-10 LAB — BASIC METABOLIC PANEL
BUN/Creatinine Ratio: 15 (ref 12–28)
BUN: 14 mg/dL (ref 8–27)
CO2: 26 mmol/L (ref 20–29)
CREATININE: 0.96 mg/dL (ref 0.57–1.00)
Calcium: 9.5 mg/dL (ref 8.7–10.3)
Chloride: 92 mmol/L — ABNORMAL LOW (ref 96–106)
GFR calc Af Amer: 63 mL/min/{1.73_m2} (ref >59)
GFR calc non Af Amer: 55 mL/min/{1.73_m2} — ABNORMAL LOW (ref >59)
GLUCOSE: 147 mg/dL — AB (ref 65–99)
Potassium: 3.3 mmol/L — ABNORMAL LOW (ref 3.5–5.2)
Sodium: 135 mmol/L (ref 134–144)

## 2018-06-10 LAB — PRO B NATRIURETIC PEPTIDE: NT-Pro BNP: 1782 pg/mL — ABNORMAL HIGH (ref 0–738)

## 2018-06-10 MED ORDER — POTASSIUM CHLORIDE CRYS ER 20 MEQ PO TBCR: 20.0000 meq | EXTENDED_RELEASE_TABLET | Freq: Every day | ORAL | 3 refills | Status: DC

## 2018-06-10 NOTE — Telephone Encounter (Signed)
Spoke with Dr. Katrinka Blazing, he advised the patient take 20 meq of kdur daily, but the first day taking the medication to take 40 meq and schedule a BMET in 1 week. Spoke with the patient's daughter per dpr. She accepted and will get the prescription and scheduled BMET for 11/7. She had no further questions.

## 2018-06-11 DIAGNOSIS — Z7901 Long term (current) use of anticoagulants: Secondary | ICD-10-CM | POA: Diagnosis not present

## 2018-06-11 DIAGNOSIS — I4821 Permanent atrial fibrillation: Secondary | ICD-10-CM | POA: Diagnosis not present

## 2018-06-12 ENCOUNTER — Telehealth: Payer: Self-pay | Admitting: *Deleted

## 2018-06-12 ENCOUNTER — Ambulatory Visit (INDEPENDENT_AMBULATORY_CARE_PROVIDER_SITE_OTHER): Payer: Medicare Other | Admitting: General Practice

## 2018-06-12 ENCOUNTER — Other Ambulatory Visit: Payer: Self-pay | Admitting: *Deleted

## 2018-06-12 DIAGNOSIS — I482 Chronic atrial fibrillation, unspecified: Secondary | ICD-10-CM

## 2018-06-12 DIAGNOSIS — Z7901 Long term (current) use of anticoagulants: Secondary | ICD-10-CM | POA: Diagnosis not present

## 2018-06-12 LAB — POCT INR: INR: 2.4 (ref 2.0–3.0)

## 2018-06-12 MED ORDER — POTASSIUM CHLORIDE CRYS ER 20 MEQ PO TBCR: 20.0000 meq | EXTENDED_RELEASE_TABLET | Freq: Every day | ORAL | 3 refills | Status: DC

## 2018-06-12 NOTE — Patient Instructions (Signed)
Pre visit review using our clinic review tool, if applicable. No additional management support is needed unless otherwise documented below in the visit note.  Continue to take 5 mg daily except 2.5 mg on Wednesdays and Saturdays.    [facility] will be the home monitoring service.  Patient's daughter notified that the results must be faxed to Bailey Mech, RN.  [name] (dtr) [phone].  Re-check in 1 to 2 weeks.

## 2018-06-12 NOTE — Telephone Encounter (Signed)
Left message to call back  

## 2018-06-12 NOTE — Telephone Encounter (Signed)
Informed pt of results. Pt verbalized understanding. 

## 2018-06-12 NOTE — Progress Notes (Signed)
Agree with management.  Stacey Silos [name], MD  

## 2018-06-12 NOTE — Telephone Encounter (Signed)
-----   Message from Lyn Records, MD sent at 06/11/2018  8:27 PM EDT ----- Let the patient know overall looks okay. A copy will be sent to [name], MD

## 2018-06-16 ENCOUNTER — Telehealth: Payer: Self-pay | Admitting: Interventional Cardiology

## 2018-06-16 NOTE — Telephone Encounter (Signed)
Left message to call back  

## 2018-06-16 NOTE — Telephone Encounter (Signed)
Spoke with pt and advised not to use salt as this could cause issues with her HF.  Pt verbalized understanding and was appreciative for call.

## 2018-06-16 NOTE — Telephone Encounter (Signed)
New Message   Received after hours message from patient inquiring whether or not she can use regular salt. Please call to discuss.

## 2018-06-16 NOTE — Telephone Encounter (Signed)
Follow up  ° ° °Patient is returning call.  °

## 2018-06-18 ENCOUNTER — Other Ambulatory Visit: Payer: Medicare Other | Admitting: *Deleted

## 2018-06-18 DIAGNOSIS — E876 Hypokalemia: Secondary | ICD-10-CM

## 2018-06-18 LAB — BASIC METABOLIC PANEL
BUN / CREAT RATIO: 18 (ref 12–28)
BUN: 16 mg/dL (ref 8–27)
CHLORIDE: 94 mmol/L — AB (ref 96–106)
CO2: 26 mmol/L (ref 20–29)
Calcium: 9.2 mg/dL (ref 8.7–10.3)
Creatinine, Ser: 0.89 mg/dL (ref 0.57–1.00)
GFR calc non Af Amer: 60 mL/min/{1.73_m2} (ref >59)
GFR, EST AFRICAN AMERICAN: 69 mL/min/{1.73_m2} (ref >59)
Glucose: 95 mg/dL (ref 65–99)
Potassium: 3.7 mmol/L (ref 3.5–5.2)
SODIUM: 137 mmol/L (ref 134–144)

## 2018-06-22 ENCOUNTER — Telehealth: Payer: Self-pay | Admitting: *Deleted

## 2018-06-22 NOTE — Telephone Encounter (Signed)
-----   Message from Lyn Records, MD sent at 06/20/2018 12:26 PM EST ----- Let the patient know the labs are okay. A copy will be sent to [name], MD

## 2018-06-22 NOTE — Telephone Encounter (Signed)
Working in [employer], called pt re: lab results.  Left a message for her to call back.

## 2018-06-26 ENCOUNTER — Encounter: Payer: Self-pay | Admitting: Family Medicine

## 2018-06-26 ENCOUNTER — Ambulatory Visit (INDEPENDENT_AMBULATORY_CARE_PROVIDER_SITE_OTHER): Payer: Medicare Other | Admitting: Family Medicine

## 2018-06-26 VITALS — BP 118/76 | HR 66 | Temp 97.6°F | Wt 110.0 lb

## 2018-06-26 DIAGNOSIS — Z7901 Long term (current) use of anticoagulants: Secondary | ICD-10-CM | POA: Diagnosis not present

## 2018-06-26 DIAGNOSIS — H6123 Impacted cerumen, bilateral: Secondary | ICD-10-CM | POA: Diagnosis not present

## 2018-06-26 MED ORDER — WARFARIN SODIUM 5 MG PO TABS: ORAL_TABLET | ORAL | 3 refills | Status: DC

## 2018-06-26 NOTE — Progress Notes (Signed)
Subjective:    Patient ID: Stacey [name], female    DOB: [birth year], 84 y.o.   MRN: [id number]  No chief complaint on file. Pt accompanied by her caregiver, Myra.  HPI Patient was seen today for ongoing concern.  Pt endorses decreased hearing and L ear pain x several days.  Myra, pt's caregiver, tried to clean her ears out this am with an OTC ear wax kit.  Pt notes L ear pain has resolved.  Pt denies fever, chills, facial pain, increased ear pressure.  Request INR check.  On coumadin for chronic afib.  Taking Coumadin 2.5 mg every Wednesday and Saturday, 5 mg all other days.  Weekly total 30 mg.  Has home INR machine.  Pt's daughter who helps her check INR is currently sick.  Past Medical History:  Diagnosis Date  . Atrial fibrillation ([place])   . Hernia of abdominal cavity   . Kyphosis     Allergies  Allergen Reactions  . Sulfa Antibiotics Anaphylaxis  . Codeine Nausea And Vomiting  . Latex Rash  . Neosporin Original [Bacitracin-Neomycin-Polymyxin] Rash  . Polysporin [Bacitracin-Polymyxin B] Rash    ROS General: Denies fever, chills, night sweats, changes in weight, changes in appetite HEENT: Denies headaches, ear pain, changes in vision, rhinorrhea, sore throat  +decreased hearing, intermittent ear pain CV: Denies CP, palpitations, SOB, orthopnea Pulm: Denies SOB, cough, wheezing GI: Denies abdominal pain, nausea, vomiting, diarrhea, constipation GU: Denies dysuria, hematuria, frequency, vaginal discharge Msk: Denies muscle cramps, joint pains Neuro: Denies weakness, numbness, tingling Skin: Denies rashes, bruising Psych: Denies depression, anxiety, hallucinations    Objective:    Blood pressure 118/76, pulse 66, temperature 97.6 F (36.4 C), temperature source Oral, weight 110 lb (49.9 kg), SpO2 94 %.  Gen. Pleasant, well-nourished, in no distress, normal affect   HEENT: [place]/AT, face symmetric, no scleral icterus,wearing glasses, PERRLA,  nares patent without  drainage, pharynx without erythema or exudate.  B/l canals impacted with cerumen, L>R. Nml TMs b/l after irrigation Lungs: no accessory muscle use Cardiovascular: RRR, no peripheral edema Skin:  Warm, no lesions/ rash  Wt Readings from Last 3 Encounters:  06/26/18 110 lb (49.9 kg)  05/27/18 113 lb 12.8 oz (51.6 kg)  05/20/18 112 lb (50.8 kg)    Lab Results  Component Value Date   WBC 5.9 05/27/2018   HGB 11.0 (L) 05/27/2018   HCT 33.0 (L) 05/27/2018   PLT 250 05/27/2018   GLUCOSE 95 06/18/2018   ALT 18 07/28/2017   AST 34 07/28/2017   NA 137 06/18/2018   K 3.7 06/18/2018   CL 94 (L) 06/18/2018   CREATININE 0.89 06/18/2018   BUN 16 06/18/2018   CO2 26 06/18/2018   INR 2.4 06/12/2018    Assessment/Plan:  Bilateral impacted cerumen  -consent obtained.  Ears irrigated.  Pt tolerated procedure well. -given handout   Long-term use of anticoagulants -On Coumadin 2/2 history of A. Fib -INR today 2.2 -continue 2.5 mg Wed and Friday, 5 mg all other days.  Weekly total 30 mg.  F/u prn  Grier Mitts, MD

## 2018-06-29 ENCOUNTER — Telehealth: Payer: Self-pay | Admitting: Family Medicine

## 2018-06-29 ENCOUNTER — Encounter: Payer: Self-pay | Admitting: Family Medicine

## 2018-06-29 DIAGNOSIS — H9203 Otalgia, bilateral: Secondary | ICD-10-CM

## 2018-06-29 DIAGNOSIS — H6123 Impacted cerumen, bilateral: Secondary | ICD-10-CM

## 2018-06-29 NOTE — Telephone Encounter (Signed)
Copied from CRM [id number]. Topic: Referral - Request for Referral >> Jun 29, 2018 10:47 AM Laural Benes, [place] C wrote: Pt was seen last week by PCP to have ears cleaned .Marland Kitchen Pt says that she is still having discomfort in her left ear. Pt would like a referral to an ENT.  CB: [id number]

## 2018-06-30 LAB — POCT INR: INR: 2.2 (ref 2.0–3.0)

## 2018-07-01 NOTE — Telephone Encounter (Signed)
Sure

## 2018-07-01 NOTE — Telephone Encounter (Signed)
Ok for ENT Referral

## 2018-07-01 NOTE — Telephone Encounter (Signed)
Pt calling again to check on the status of this referral.  Pt is still having a lot of pain in her ear. Pt can be reached at [phone]

## 2018-07-02 NOTE — Telephone Encounter (Signed)
Referral placed in Epic.  Pt will be contacted with appointment info.

## 2018-07-03 DIAGNOSIS — H9202 Otalgia, left ear: Secondary | ICD-10-CM | POA: Diagnosis not present

## 2018-07-03 DIAGNOSIS — H900 Conductive hearing loss, bilateral: Secondary | ICD-10-CM | POA: Insufficient documentation

## 2018-07-03 DIAGNOSIS — H6123 Impacted cerumen, bilateral: Secondary | ICD-10-CM | POA: Diagnosis not present

## 2018-07-03 DIAGNOSIS — H60332 Swimmer's ear, left ear: Secondary | ICD-10-CM | POA: Insufficient documentation

## 2018-07-03 DIAGNOSIS — J343 Hypertrophy of nasal turbinates: Secondary | ICD-10-CM | POA: Diagnosis not present

## 2018-07-03 DIAGNOSIS — R439 Unspecified disturbances of smell and taste: Secondary | ICD-10-CM | POA: Diagnosis not present

## 2018-07-03 DIAGNOSIS — Z9089 Acquired absence of other organs: Secondary | ICD-10-CM | POA: Diagnosis not present

## 2018-07-03 NOTE — Telephone Encounter (Signed)
Pt is aware the referral coordinator will call her back

## 2018-07-07 ENCOUNTER — Telehealth: Payer: Self-pay | Admitting: General Practice

## 2018-07-07 NOTE — Telephone Encounter (Signed)
LMOVM for patient's daughter, [name] to call Bailey Mech, RN @ [facility] @ [phone].  Patient is 12 days past due reporting her INR.

## 2018-07-14 ENCOUNTER — Ambulatory Visit (INDEPENDENT_AMBULATORY_CARE_PROVIDER_SITE_OTHER): Payer: Medicare Other | Admitting: General Practice

## 2018-07-14 ENCOUNTER — Other Ambulatory Visit: Payer: Self-pay | Admitting: Family Medicine

## 2018-07-14 DIAGNOSIS — Z7901 Long term (current) use of anticoagulants: Secondary | ICD-10-CM

## 2018-07-14 DIAGNOSIS — I482 Chronic atrial fibrillation, unspecified: Secondary | ICD-10-CM

## 2018-07-14 LAB — POCT INR: INR: 2.1 (ref 2.0–3.0)

## 2018-07-14 NOTE — Telephone Encounter (Signed)
Copied from CRM [id number]. Topic: Quick Communication - Rx Refill/Question >> Jul 14, 2018  9:57 AM Jaquita Rector A wrote: Medication: prednisoLONE 5 MG TABS tablet   Has the patient contacted their pharmacy? Yes.   (Agent: If no, request that the patient contact the pharmacy for the refill.) (Agent: If yes, when and what did the pharmacy advise?)  Preferred Pharmacy (with phone number or street name): [facility] - [place], [place] - [address] [phone] (Phone) [phone] (Fax)     Agent: Please be advised that RX refills may take up to 3 business days. We ask that you follow-up with your pharmacy.

## 2018-07-14 NOTE — Patient Instructions (Signed)
Pre visit review using our clinic review tool, if applicable. No additional management support is needed unless otherwise documented below in the visit note.  Continue to take 5 mg daily except 2.5 mg on Wednesdays and Saturdays.    [facility] will be the home monitoring service.  Patient's daughter notified that the results must be faxed to Cindy Madyx Delfin, RN.  Stacey [name] (dtr) [phone].  Re-check in 1 to 2 weeks.    

## 2018-07-14 NOTE — Telephone Encounter (Signed)
Attempted to call pt pt but no answer at this time. Left voicemail message for pt to contact office regarding refill of Prednisolone 5 mg tablet.

## 2018-07-23 NOTE — Telephone Encounter (Signed)
Called left pt a detailed message regarding her prednisone refill, advised to call her pharmacy since they have more refills on the Rx

## 2018-07-23 NOTE — Telephone Encounter (Signed)
Pt states she did not want her prednisone sent to [facility] but to [facility] due to the cost being more expensive. She states she has about 2 weeks of prednisone but she would like the rx sent to [facility].

## 2018-07-24 ENCOUNTER — Ambulatory Visit: Payer: Self-pay

## 2018-07-24 MED ORDER — PREDNISOLONE 5 MG PO TABS: 5.0000 mg | ORAL_TABLET | Freq: Every day | ORAL | 1 refills | Status: DC

## 2018-07-24 NOTE — Telephone Encounter (Signed)
Summary: med question    Pt called in and would like to know with the meds that she is currently on, she would like to know if it would be ok to take Museux for her sinus?      Left message for patient- 3rd attempt-  Sorry we missed her again. May call pharmacy to check about OTC medications with Rx medications if not able to reach office- would be glad to help her - please call back if needed.

## 2018-07-24 NOTE — Telephone Encounter (Signed)
Pharmacy will open at 9- call to verify # of refills left so remainder can be sent to mail order.

## 2018-07-24 NOTE — Addendum Note (Signed)
Addended by: Elby Beck F on: 07/24/2018 09:17 AM   Modules accepted: Orders

## 2018-07-27 NOTE — Telephone Encounter (Addendum)
Patient needs to know if she can take Mucinex with her medication she is currently taking. Pharmacy did states it was ok- to take. Put medication in Micro medex and it is safe as long as the DM is avoided. She is increase fluids and make sure patient is up and moving around - patient is with patient and is giving permission for her to talk to [place]. Advised home nurse to encourage patient to make appointment if she does not improve.

## 2018-07-27 NOTE — Telephone Encounter (Signed)
Please advise 

## 2018-07-27 NOTE — Telephone Encounter (Signed)
  Reason for Disposition . Caller has medication question only, adult not sick, and triager answers question  Answer Assessment - Initial Assessment Questions 1. SYMPTOMS: "Do you have any symptoms?"     Patient has a cough and wants to know what is safe to use- OTC 2. SEVERITY: If symptoms are present, ask "Are they mild, moderate or severe?"     Mild/moderate- she declines to make appointment at this time.  Protocols used: MEDICATION QUESTION CALL-A-AH

## 2018-07-27 NOTE — Telephone Encounter (Signed)
Ok to take regular mucinex.

## 2018-07-27 NOTE — Telephone Encounter (Signed)
Please Advise

## 2018-07-28 ENCOUNTER — Ambulatory Visit (INDEPENDENT_AMBULATORY_CARE_PROVIDER_SITE_OTHER): Payer: Medicare Other | Admitting: General Practice

## 2018-07-28 DIAGNOSIS — I482 Chronic atrial fibrillation, unspecified: Secondary | ICD-10-CM

## 2018-07-28 DIAGNOSIS — Z7901 Long term (current) use of anticoagulants: Secondary | ICD-10-CM | POA: Diagnosis not present

## 2018-07-28 LAB — POCT INR: INR: 2.6 (ref 2.0–3.0)

## 2018-07-28 NOTE — Patient Instructions (Signed)
Pre visit review using our clinic review tool, if applicable. No additional management support is needed unless otherwise documented below in the visit note.  Continue to take 5 mg daily except 2.5 mg on Wednesdays and Saturdays.    [facility] will be the home monitoring service.  Patient's daughter notified that the results must be faxed to Cindy Boyd, RN.  Janice (dtr) [phone].  Re-check in 1 to 2 weeks.    

## 2018-07-28 NOTE — Telephone Encounter (Signed)
Spoke with pt states that she spoke with her pharmacy and they advised her that it was ok to take mucinex as requested

## 2018-07-29 ENCOUNTER — Other Ambulatory Visit: Payer: Self-pay | Admitting: Family Medicine

## 2018-07-29 ENCOUNTER — Telehealth: Payer: Self-pay

## 2018-07-29 MED ORDER — GABAPENTIN 400 MG PO CAPS: 400.0000 mg | ORAL_CAPSULE | Freq: Three times a day (TID) | ORAL | 1 refills | Status: DC

## 2018-07-29 NOTE — Telephone Encounter (Signed)
Rx for [facility] on 07/24/2018 for refill

## 2018-07-29 NOTE — Telephone Encounter (Signed)
Copied from CRM [id number]. Topic: General - Other >> Jul 29, 2018 12:59 PM Percival Spanish wrote:  Pt call to say [facility] does not carry prednisoLONE 5 MG TABS tablet. Pt is asking is regular PREDNISONE can be called in   [facility]

## 2018-07-29 NOTE — Telephone Encounter (Signed)
Copied from CRM [id number]. Topic: Quick Communication - Rx Refill/Question >> Jul 29, 2018 12:07 PM Zada Girt, L[place] L wrote: Medication: gabapentin (NEURONTIN) 400 MG capsule  Has the patient contacted their pharmacy? Yes.   (Agent: If no, request that the patient contact the pharmacy for the refill.) (Agent: If yes, when and what did the pharmacy advise?)  Preferred Pharmacy (with phone number or street name): [facility] - [place], [place] - [address] [place] [address] Phone: [phone] Fax: [phone]  Agent: Please be advised that RX refills may take up to 3 business days. We ask that you follow-up with your pharmacy.

## 2018-07-30 ENCOUNTER — Ambulatory Visit (INDEPENDENT_AMBULATORY_CARE_PROVIDER_SITE_OTHER): Payer: Medicare Other | Admitting: Family Medicine

## 2018-07-30 ENCOUNTER — Encounter: Payer: Self-pay | Admitting: Family Medicine

## 2018-07-30 VITALS — BP 108/76 | HR 84 | Temp 97.5°F | Wt 109.0 lb

## 2018-07-30 DIAGNOSIS — J40 Bronchitis, not specified as acute or chronic: Secondary | ICD-10-CM

## 2018-07-30 MED ORDER — IPRATROPIUM-ALBUTEROL 0.5-2.5 (3) MG/3ML IN SOLN
3.0000 mL | Freq: Once | RESPIRATORY_TRACT | Status: AC
  Administered 2018-07-30: 3 mL via RESPIRATORY_TRACT

## 2018-07-30 NOTE — Telephone Encounter (Signed)
Please Advise

## 2018-07-30 NOTE — Progress Notes (Signed)
Subjective:    Patient ID: Stacey [name], female    DOB: 1933/05/14, 84 y.o.   MRN: [id number]  No chief complaint on file. Patient is accompanied by her caregiver, Myra.  HPI Patient was seen today for acute concern.  Pt with cough/chest congestion x1 week.  Also with decreased appetite and pain in the neck-now resolved.  Pt has tried Mucinex for her symptoms.  Sick contacts include pt's daughter who had strep throat and pt's caregiver who had sinus infection.  Pt denies fever, chills, nausea, vomiting, sore throat, facial pain/pressure, ear pain/pressure, headache.  Past Medical History:  Diagnosis Date  . Atrial fibrillation ([facility])   . Hernia of abdominal cavity   . Kyphosis     Allergies  Allergen Reactions  . Sulfa Antibiotics Anaphylaxis  . Codeine Nausea And Vomiting  . Latex Rash  . Neosporin Original [Bacitracin-Neomycin-Polymyxin] Rash  . Polysporin [Bacitracin-Polymyxin B] Rash   ROS General: Denies fever, chills, night sweats, changes in weight   +changes in appetite HEENT: Denies headaches, ear pain, changes in vision, rhinorrhea, sore throat CV: Denies CP, palpitations, SOB, orthopnea Pulm: Denies SOB, wheezing  +cough/chest congestion. GI: Denies abdominal pain, nausea, vomiting, diarrhea, constipation GU: Denies dysuria, hematuria, frequency, vaginal discharge Msk: Denies muscle cramps, joint pains Neuro: Denies weakness, numbness, tingling Skin: Denies rashes, bruising Psych: Denies depression, anxiety, hallucinations     Objective:    Blood pressure 108/76, pulse 84, temperature (!) 97.5 F (36.4 C), temperature source Oral, weight 109 lb (49.4 kg), SpO2 91 %.  SpO2 increased to 95% s/p neb treatment  Gen. Pleasant, well-nourished, in no distress, normal affect  HEENT: [place]/AT, face symmetric, no scleral icterus, PERRLA, nares patent without drainage, pharynx with post nasal drainage, no erythema or exudate.  TMs full bilaterally.  No cervical  lymphadenopathy. Lungs: no accessory muscle use, expiratory wheezes noted throughout.  Wheezing improved s/p neb treatment Cardiovascular: RRR, no m/r/g, no peripheral edema Neuro:  A&Ox3, CN II-XII intact, normal gait  Wt Readings from Last 3 Encounters:  07/30/18 109 lb (49.4 kg)  06/26/18 110 lb (49.9 kg)  05/27/18 113 lb 12.8 oz (51.6 kg)    Lab Results  Component Value Date   WBC 5.9 05/27/2018   HGB 11.0 (L) 05/27/2018   HCT 33.0 (L) 05/27/2018   PLT 250 05/27/2018   GLUCOSE 95 06/18/2018   ALT 18 07/28/2017   AST 34 07/28/2017   NA 137 06/18/2018   K 3.7 06/18/2018   CL 94 (L) 06/18/2018   CREATININE 0.89 06/18/2018   BUN 16 06/18/2018   CO2 26 06/18/2018   INR 2.6 07/28/2018    Assessment/Plan:  Bronchitis  -Ipratropium albuterol Neb given in clinic.  PO2 increased to 95% from 91%.  Wheezing improved. -discussed supportive care-mucinex, increasing po intake of fluids, etc.  Abx not warranted at this time -pt declines inhaler. -given handout -given RTC or ED precautions for worsening symptoms  F/u prn  Abbe Amsterdam, MD

## 2018-07-30 NOTE — Patient Instructions (Signed)

## 2018-08-06 DIAGNOSIS — H9202 Otalgia, left ear: Secondary | ICD-10-CM | POA: Diagnosis not present

## 2018-08-08 DIAGNOSIS — I4821 Permanent atrial fibrillation: Secondary | ICD-10-CM | POA: Diagnosis not present

## 2018-08-08 DIAGNOSIS — Z7901 Long term (current) use of anticoagulants: Secondary | ICD-10-CM | POA: Diagnosis not present

## 2018-08-11 ENCOUNTER — Ambulatory Visit (INDEPENDENT_AMBULATORY_CARE_PROVIDER_SITE_OTHER): Payer: Medicare Other | Admitting: General Practice

## 2018-08-11 DIAGNOSIS — I482 Chronic atrial fibrillation, unspecified: Secondary | ICD-10-CM

## 2018-08-11 DIAGNOSIS — Z7901 Long term (current) use of anticoagulants: Secondary | ICD-10-CM

## 2018-08-11 LAB — POCT INR: INR: 2.4 (ref 2.0–3.0)

## 2018-08-11 NOTE — Patient Instructions (Signed)
Pre visit review using our clinic review tool, if applicable. No additional management support is needed unless otherwise documented below in the visit note.  Continue to take 5 mg daily except 2.5 mg on Wednesdays and Saturdays.    [facility] will be the home monitoring service.  Patient's daughter notified that the results must be faxed to Cindy Donyale Falcon, RN.  Janice (dtr) [phone].  Re-check in 1 to 2 weeks.    

## 2018-08-16 NOTE — Telephone Encounter (Signed)
Rx was sent to a local pharmacy at the time of pt's OFV in Dec 2018.

## 2018-08-17 ENCOUNTER — Ambulatory Visit: Payer: Self-pay

## 2018-08-17 NOTE — Telephone Encounter (Signed)
Incoming call from Patient who complains of burning frequency and urgency with urination.States it started  Friday.  States that there is blood in the urine.  Patient states that she is unable come in for visit.Woul like for Rx to be called in to [facility]. Patient awaits return call from office. Patient states that she has  a history of UTI. Reason for Disposition . Urinating more frequently than usual (i.e., frequency)  Answer Assessment - Initial Assessment Questions 1. SYMPTOM: "What's the main symptom you're concerned about?" (e.g., frequency, incontinence)      A lot of Uti infection over the years.  Burning , pressure frquency some bleeding  2. ONSET: "When did the  *No Answer*  start?"     *No Answer*Friday 3. PAIN: "Is there any pain?" If so, ask: "How bad is it?" (Scale: 1-10; mild, moderate, severe)     Had pain FRiday and part of Sat. 4. CAUSE: "What do you think is causing the symptoms?"     *No Answer* 5. OTHER SYMPTOMS: "Do you have any other symptoms?" (e.g., fever, flank pain, blood in urine, pain with urination)     Blood in the urine 6. PREGNANCY: "Is there any chance you are pregnant?" "When was your last menstrual period?"     na  Protocols used: URINARY [facility]

## 2018-08-17 NOTE — Telephone Encounter (Signed)
Can family or pt's caregiver bring a urine sample in to clinic?

## 2018-08-18 NOTE — Telephone Encounter (Signed)
Spoke with pt caregiver verbalized understanding to collect a urine sample from the pt and dropped it to the clinic for testing

## 2018-08-20 ENCOUNTER — Encounter: Payer: Self-pay | Admitting: Adult Health

## 2018-08-20 ENCOUNTER — Ambulatory Visit: Payer: Medicare Other | Admitting: Family Medicine

## 2018-08-20 ENCOUNTER — Ambulatory Visit (INDEPENDENT_AMBULATORY_CARE_PROVIDER_SITE_OTHER): Payer: Medicare Other | Admitting: Adult Health

## 2018-08-20 VITALS — BP 118/74 | Temp 97.8°F

## 2018-08-20 DIAGNOSIS — N3000 Acute cystitis without hematuria: Secondary | ICD-10-CM | POA: Diagnosis not present

## 2018-08-20 LAB — POC URINALSYSI DIPSTICK (AUTOMATED)
Glucose, UA: NEGATIVE
Protein, UA: POSITIVE — AB
Spec Grav, UA: 1.02 (ref 1.010–1.025)
Urobilinogen, UA: 1 E.U./dL
pH, UA: 5.5 (ref 5.0–8.0)

## 2018-08-20 MED ORDER — FOSFOMYCIN TROMETHAMINE 3 G PO PACK: 3.0000 g | PACK | Freq: Once | ORAL | 0 refills | Status: AC

## 2018-08-20 NOTE — Progress Notes (Signed)
Subjective:    Patient ID: Stacey [name], female    DOB: 1933/02/22, 85 y.o.   MRN: [id number]  Urinary Tract Infection   This is a new problem. The current episode started in the past 7 days. The problem has been unchanged. There has been no fever. She is not sexually active. Associated symptoms include frequency, hematuria, nausea and urgency. Pertinent negatives include no chills, discharge or flank pain. Her past medical history is significant for recurrent UTIs.   She reports being very sensitive to antibiotics    Review of Systems  Constitutional: Negative.  Negative for chills.  Respiratory: Negative.   Cardiovascular: Negative.   Gastrointestinal: Positive for nausea.  Genitourinary: Positive for dysuria, frequency, hematuria and urgency. Negative for decreased urine volume, flank pain and pelvic pain.   Past Medical History:  Diagnosis Date  . Atrial fibrillation ([facility])   . Hernia of abdominal cavity   . Kyphosis     Social History   Socioeconomic History  . Marital status: Widowed    Spouse name: Not on file  . Number of children: Not on file  . Years of education: Not on file  . Highest education level: Not on file  Occupational History  . Not on file  Social Needs  . Financial resource strain: Not on file  . Food insecurity:    Worry: Not on file    Inability: Not on file  . Transportation needs:    Medical: Not on file    Non-medical: Not on file  Tobacco Use  . Smoking status: Former Games developer  . Smokeless tobacco: Never Used  Substance and Sexual Activity  . Alcohol use: No    Frequency: Never  . Drug use: No  . Sexual activity: Not on file  Lifestyle  . Physical activity:    Days per week: Not on file    Minutes per session: Not on file  . Stress: Not on file  Relationships  . Social connections:    Talks on phone: Not on file    Gets together: Not on file    Attends religious service: Not on file    Active member of club or  organization: Not on file    Attends meetings of clubs or organizations: Not on file    Relationship status: Not on file  . Intimate partner violence:    Fear of current or ex partner: Not on file    Emotionally abused: Not on file    Physically abused: Not on file    Forced sexual activity: Not on file  Other Topics Concern  . Not on file  Social History Narrative  . Not on file    Past Surgical History:  Procedure Laterality Date  . ABDOMINAL HYSTERECTOMY    . TONSILLECTOMY      History reviewed. No pertinent family history.  Allergies  Allergen Reactions  . Sulfa Antibiotics Anaphylaxis  . Codeine Nausea And Vomiting  . Latex Rash  . Neosporin Original [Bacitracin-Neomycin-Polymyxin] Rash  . Polysporin [Bacitracin-Polymyxin B] Rash    Current Outpatient Medications on File Prior to Visit  Medication Sig Dispense Refill  . atorvastatin (LIPITOR) 10 MG tablet Take 1 tablet (10 mg total) by mouth daily. 90 tablet 3  . cyanocobalamin 1000 MCG tablet Take 1,000 mcg by mouth daily.    Marland Kitchen desloratadine (CLARINEX) 5 MG tablet Take 5 mg by mouth daily.    Marland Kitchen gabapentin (NEURONTIN) 400 MG capsule Take 1 capsule (400 mg total) by  mouth 3 (three) times daily. 270 capsule 1  . meclizine (ANTIVERT) 12.5 MG tablet Take 1 tablet (12.5 mg total) by mouth 3 (three) times daily as needed for dizziness. 30 tablet 0  . metoprolol tartrate (LOPRESSOR) 100 MG tablet Take 1 tablet (100 mg total) by mouth 2 (two) times daily. 180 tablet 3  . Multiple Vitamin (MULTIVITAMIN) capsule Take 1 capsule by mouth daily.    Marland Kitchen omeprazole (PRILOSEC) 20 MG capsule Take 1 capsule (20 mg total) by mouth daily. 90 capsule 0  . potassium chloride SA (K-DUR,KLOR-CON) 20 MEQ tablet Take 1 tablet (20 mEq total) by mouth daily. 90 tablet 3  . prednisoLONE 5 MG TABS tablet Take 1 tablet (5 mg total) by mouth daily. 90 tablet 1  . ranitidine (ZANTAC) 150 MG tablet Take 150 mg by mouth 2 (two) times daily as needed for  heartburn.    . sertraline (ZOLOFT) 100 MG tablet Take 1 tablet (100 mg total) by mouth daily. 90 tablet 3  . spironolactone (ALDACTONE) 25 MG tablet Take 0.5 tablets (12.5 mg total) by mouth daily. 90 tablet 3  . torsemide (DEMADEX) 20 MG tablet Take 2 tablets (40 mg total) by mouth daily. 180 tablet 3  . traMADol (ULTRAM) 50 MG tablet Take 100 mg by mouth every 6 (six) hours as needed for moderate pain or severe pain.    Marland Kitchen VITAMIN D, CHOLECALCIFEROL, PO Take 1,000 Units by mouth daily.     Marland Kitchen warfarin (COUMADIN) 5 MG tablet Take 2.5 mg on Wednesday and Saturday.  5 mg all other days 100 tablet 3   No current facility-administered medications on file prior to visit.     BP 118/74   Temp 97.8 F (36.6 C)        Objective:   Physical Exam Vitals signs and nursing note reviewed.  Constitutional:      Appearance: Normal appearance.  Cardiovascular:     Rate and Rhythm: Normal rate. Rhythm irregularly irregular.     Pulses: Normal pulses.     Heart sounds: Normal heart sounds.  Pulmonary:     Effort: Pulmonary effort is normal.     Breath sounds: Normal breath sounds.  Abdominal:     General: Abdomen is flat.     Palpations: Abdomen is soft.     Tenderness: There is no abdominal tenderness. There is no right CVA tenderness or left CVA tenderness.  Neurological:     General: No focal deficit present.       Assessment & Plan:  1. Acute cystitis without hematuria  - POCT Urinalysis Dipstick (Automated) + blood, leuks, nit. And protein  - fosfomycin (MONUROL) 3 g PACK; Take 3 g by mouth once for 1 dose.  Dispense: 3 g; Refill: 0 - Culture, Urine  Shirline Frees, NP

## 2018-08-22 LAB — URINE CULTURE
MICRO NUMBER:: 33799
SPECIMEN QUALITY:: ADEQUATE

## 2018-08-24 ENCOUNTER — Other Ambulatory Visit: Payer: Self-pay | Admitting: Family Medicine

## 2018-08-24 DIAGNOSIS — R42 Dizziness and giddiness: Secondary | ICD-10-CM

## 2018-08-24 NOTE — Telephone Encounter (Signed)
Patient called and spoke to daughter, Stacey [name], about the refill request. I asked is her mother dizzy, she says she has bouts of dizziness. She says she takes it once a day to keep from getting dizzy, especially now that she's dealing with a UTI. She would like for a 90 day supply to be sent to the pharmacy.

## 2018-08-24 NOTE — Telephone Encounter (Signed)
Requested medication (s) are due for refill today: Yes  Requested medication (s) are on the active medication list: Yes  Last refill:  04/17/18  Future visit scheduled: Yes  Notes to clinic:  Unable to refill, cannot be delegated. See notes attached.     Requested Prescriptions  Pending Prescriptions Disp Refills   meclizine (ANTIVERT) 12.5 MG tablet 30 tablet 0    Sig: Take 1 tablet (12.5 mg total) by mouth 3 (three) times daily as needed for dizziness.     Not Delegated - Gastroenterology: Antiemetics Failed - 08/24/2018  4:17 PM      Failed - This refill cannot be delegated      Passed - Valid encounter within last 6 months    Recent Outpatient Visits          4 days ago Acute cystitis without hematuria   [facility] at [facility], [place], NP   3 weeks ago Bronchitis   [facility] at [facility], Bettey Mare, MD   1 month ago Bilateral impacted cerumen   [facility] at [facility], Bettey Mare, MD   3 months ago Loose stools   [facility], Bettey Mare, MD   6 months ago Other chest pain   [facility] at [facility], Bettey Mare, MD      Future Appointments            In 1 month Salomon Fick, Bettey Mare, MD [facility] [place], [facility]

## 2018-08-24 NOTE — Telephone Encounter (Signed)
Copied from CRM [id number]. Topic: Quick Communication - Rx Refill/Question >> Aug 24, 2018  1:18 PM Mickel Baas B, [place] wrote: Medication: meclizine (ANTIVERT) 12.5 MG tablet   Has the patient contacted their pharmacy? Yes.   (Agent: If no, request that the patient contact the pharmacy for the refill.) (Agent: If yes, when and what did the pharmacy advise?)  Preferred Pharmacy (with phone number or street name): [facility] - [place], [place] - [address]  Agent: Please be advised that RX refills may take up to 3 business days. We ask that you follow-up with your pharmacy.

## 2018-08-25 ENCOUNTER — Ambulatory Visit (INDEPENDENT_AMBULATORY_CARE_PROVIDER_SITE_OTHER): Payer: Medicare Other | Admitting: General Practice

## 2018-08-25 DIAGNOSIS — I482 Chronic atrial fibrillation, unspecified: Secondary | ICD-10-CM

## 2018-08-25 DIAGNOSIS — Z7901 Long term (current) use of anticoagulants: Secondary | ICD-10-CM

## 2018-08-25 LAB — POCT INR: INR: 2.3 (ref 2.0–3.0)

## 2018-08-25 MED ORDER — MECLIZINE HCL 12.5 MG PO TABS: 12.5000 mg | ORAL_TABLET | Freq: Three times a day (TID) | ORAL | 0 refills | Status: DC | PRN

## 2018-08-25 NOTE — Patient Instructions (Signed)
Pre visit review using our clinic review tool, if applicable. No additional management support is needed unless otherwise documented below in the visit note.  Continue to take 5 mg daily except 2.5 mg on Wednesdays and Saturdays.    [facility] will be the home monitoring service.  Patient's daughter notified that the results must be faxed to Cindy Boyd, RN.  Janice (dtr) [phone].  Re-check in 1 to 2 weeks.    

## 2018-09-04 ENCOUNTER — Ambulatory Visit (INDEPENDENT_AMBULATORY_CARE_PROVIDER_SITE_OTHER): Payer: Medicare Other | Admitting: Podiatry

## 2018-09-04 ENCOUNTER — Encounter: Payer: Self-pay | Admitting: Podiatry

## 2018-09-04 VITALS — BP 94/70

## 2018-09-04 DIAGNOSIS — L84 Corns and callosities: Secondary | ICD-10-CM

## 2018-09-04 DIAGNOSIS — B351 Tinea unguium: Secondary | ICD-10-CM

## 2018-09-04 DIAGNOSIS — M79674 Pain in right toe(s): Secondary | ICD-10-CM | POA: Diagnosis not present

## 2018-09-04 DIAGNOSIS — L97521 Non-pressure chronic ulcer of other part of left foot limited to breakdown of skin: Secondary | ICD-10-CM

## 2018-09-04 DIAGNOSIS — Z9229 Personal history of other drug therapy: Secondary | ICD-10-CM | POA: Diagnosis not present

## 2018-09-04 DIAGNOSIS — M79675 Pain in left toe(s): Secondary | ICD-10-CM

## 2018-09-04 NOTE — Patient Instructions (Addendum)
Onychomycosis/Fungal Toenails  WHAT IS IT? An infection that lies within the keratin of your nail plate that is caused by a fungus.  WHY ME? Fungal infections affect all ages, sexes, races, and creeds.  There may be many factors that predispose you to a fungal infection such as age, coexisting medical conditions such as diabetes, or an autoimmune disease; stress, medications, fatigue, genetics, etc.  Bottom line: fungus thrives in a warm, moist environment and your shoes offer such a location.  IS IT CONTAGIOUS? Theoretically, yes.  You do not want to share shoes, nail clippers or files with someone who has fungal toenails.  Walking around barefoot in the same room or sleeping in the same bed is unlikely to transfer the organism.  It is important to realize, however, that fungus can spread easily from one nail to the next on the same foot.  HOW DO WE TREAT THIS?  There are several ways to treat this condition.  Treatment may depend on many factors such as age, medications, pregnancy, liver and kidney conditions, etc.  It is best to ask your doctor which options are available to you.  1. No treatment.   Unlike many other medical concerns, you can live with this condition.  However for many people this can be a painful condition and may lead to ingrown toenails or a bacterial infection.  It is recommended that you keep the nails cut short to help reduce the amount of fungal nail. 2. Topical treatment.  These range from herbal remedies to prescription strength nail lacquers.  About 40-50% effective, topicals require twice daily application for approximately 9 to 12 months or until an entirely new nail has grown out.  The most effective topicals are medical grade medications available through physicians offices. 3. Oral antifungal medications.  With an 80-90% cure rate, the most common oral medication requires 3 to 4 months of therapy and stays in your system for a year as the new nail grows out.  Oral  antifungal medications do require blood work to make sure it is a safe drug for you.  A liver function panel will be performed prior to starting the medication and after the first month of treatment.  It is important to have the blood work performed to avoid any harmful side effects.  In general, this medication safe but blood work is required. 4. Laser Therapy.  This treatment is performed by applying a specialized laser to the affected nail plate.  This therapy is noninvasive, fast, and non-painful.  It is not covered by insurance and is therefore, out of pocket.  The results have been very good with a 80-95% cure rate.  [facility] is the only practice in the area to offer this therapy. 5. Permanent Nail Avulsion.  Removing the entire nail so that a new nail will not grow back.  Corns and Calluses Corns are small areas of thickened skin that occur on the top, sides, or tip of a toe. They contain a cone-shaped core with a point that can press on a nerve below. This causes pain.  Calluses are areas of thickened skin that can occur anywhere on the body, including the hands, fingers, palms, soles of the feet, and heels. Calluses are usually larger than corns. What are the causes? Corns and calluses are caused by rubbing (friction) or pressure, such as from shoes that are too tight or do not fit properly. What increases the risk? Corns are more likely to develop in people   who have misshapen toes (toe deformities), such as hammer toes. Calluses can occur with friction to any area of the skin. They are more likely to develop in people who:  Work with their hands.  Wear shoes that fit poorly, are too tight, or are high-heeled.  Have toe deformities. What are the signs or symptoms? Symptoms of a corn or callus include:  A hard growth on the skin.  Pain or tenderness under the skin.  Redness and swelling.  Increased discomfort while wearing tight-fitting shoes, if your feet are  affected. If a corn or callus becomes infected, symptoms may include:  Redness and swelling that gets worse.  Pain.  Fluid, blood, or pus draining from the corn or callus. How is this diagnosed? Corns and calluses may be diagnosed based on your symptoms, your medical history, and a physical exam. How is this treated? Treatment for corns and calluses may include:  Removing the cause of the friction or pressure. This may involve: ? Changing your shoes. ? Wearing shoe inserts (orthotics) or other protective layers in your shoes, such as a corn pad. ? Wearing gloves.  Applying medicine to the skin (topical medicine) to help soften skin in the hardened, thickened areas.  Removing layers of dead skin with a file to reduce the size of the corn or callus.  Removing the corn or callus with a scalpel or laser.  Taking antibiotic medicines, if your corn or callus is infected.  Having surgery, if a toe deformity is the cause. Follow these instructions at home:   Take over-the-counter and prescription medicines only as told by your health care provider.  If you were prescribed an antibiotic, take it as told by your health care provider. Do not stop taking it even if your condition starts to improve.  Wear shoes that fit well. Avoid wearing high-heeled shoes and shoes that are too tight or too loose.  Wear any padding, protective layers, gloves, or orthotics as told by your health care provider.  Soak your hands or feet and then use a file or pumice stone to soften your corn or callus. Do this as told by your health care provider.  Check your corn or callus every day for symptoms of infection. Contact a health care provider if you:  Notice that your symptoms do not improve with treatment.  Have redness or swelling that gets worse.  Notice that your corn or callus becomes painful.  Have fluid, blood, or pus coming from your corn or callus.  Have new symptoms. Summary  Corns are  small areas of thickened skin that occur on the top, sides, or tip of a toe.  Calluses are areas of thickened skin that can occur anywhere on the body, including the hands, fingers, palms, and soles of the feet. Calluses are usually larger than corns.  Corns and calluses are caused by rubbing (friction) or pressure, such as from shoes that are too tight or do not fit properly.  Treatment may include wearing any padding, protective layers, gloves, or orthotics as told by your health care provider. This information is not intended to replace advice given to you by your health care provider. Make sure you discuss any questions you have with your health care provider. Document Released: 05/04/2004 Document Revised: 06/11/2017 Document Reviewed: 06/11/2017 Elsevier Interactive Patient Education  2019 Elsevier Inc.  

## 2018-09-07 ENCOUNTER — Telehealth: Payer: Self-pay | Admitting: Family Medicine

## 2018-09-07 DIAGNOSIS — R42 Dizziness and giddiness: Secondary | ICD-10-CM

## 2018-09-07 LAB — POCT INR: INR: 2.2 (ref 2.0–3.0)

## 2018-09-07 NOTE — Telephone Encounter (Signed)
Copied from CRM [id number]. Topic: General - Other >> Sep 07, 2018  9:57 AM Marylen Ponto wrote: Reason for CRM: Pt stated [facility] does not have the meclizine (ANTIVERT) 12.5 MG tablet so she would like the Rx sent to [facility], [place] - [address] [phone] (Phone)  [phone] (Fax)

## 2018-09-08 ENCOUNTER — Other Ambulatory Visit: Payer: Self-pay | Admitting: Family Medicine

## 2018-09-08 DIAGNOSIS — F419 Anxiety disorder, unspecified: Principal | ICD-10-CM

## 2018-09-08 DIAGNOSIS — F32A Depression, unspecified: Secondary | ICD-10-CM

## 2018-09-08 DIAGNOSIS — F329 Major depressive disorder, single episode, unspecified: Secondary | ICD-10-CM

## 2018-09-11 ENCOUNTER — Ambulatory Visit (INDEPENDENT_AMBULATORY_CARE_PROVIDER_SITE_OTHER): Payer: Medicare Other | Admitting: General Practice

## 2018-09-11 DIAGNOSIS — M1120 Other chondrocalcinosis, unspecified site: Secondary | ICD-10-CM | POA: Diagnosis not present

## 2018-09-11 DIAGNOSIS — I482 Chronic atrial fibrillation, unspecified: Secondary | ICD-10-CM

## 2018-09-11 DIAGNOSIS — M150 Primary generalized (osteo)arthritis: Secondary | ICD-10-CM | POA: Diagnosis not present

## 2018-09-11 DIAGNOSIS — M5431 Sciatica, right side: Secondary | ICD-10-CM | POA: Diagnosis not present

## 2018-09-11 DIAGNOSIS — Z7952 Long term (current) use of systemic steroids: Secondary | ICD-10-CM | POA: Diagnosis not present

## 2018-09-11 DIAGNOSIS — Z7901 Long term (current) use of anticoagulants: Secondary | ICD-10-CM

## 2018-09-11 DIAGNOSIS — M2550 Pain in unspecified joint: Secondary | ICD-10-CM | POA: Diagnosis not present

## 2018-09-11 DIAGNOSIS — Z681 Body mass index (BMI) 19 or less, adult: Secondary | ICD-10-CM | POA: Diagnosis not present

## 2018-09-11 DIAGNOSIS — M154 Erosive (osteo)arthritis: Secondary | ICD-10-CM | POA: Diagnosis not present

## 2018-09-11 MED ORDER — MECLIZINE HCL 12.5 MG PO TABS: 12.5000 mg | ORAL_TABLET | Freq: Three times a day (TID) | ORAL | 0 refills | Status: DC | PRN

## 2018-09-11 NOTE — Telephone Encounter (Signed)
Ok to refill, just filled on 08/25/2018 for 30 tablets

## 2018-09-11 NOTE — Patient Instructions (Addendum)
Pre visit review using our clinic review tool, if applicable. No additional management support is needed unless otherwise documented below in the visit note.  Continue to take 5 mg daily except 2.5 mg on Wednesdays and Saturdays.    [employer] [facility] will be the home monitoring service.  Patient's daughter notified that the results must be faxed to Cindy Boyd, RN.  Janice (dtr) [phone].  Re-check in 1 to 2 weeks.    

## 2018-09-14 ENCOUNTER — Other Ambulatory Visit: Payer: Self-pay | Admitting: *Deleted

## 2018-09-14 ENCOUNTER — Ambulatory Visit: Payer: Medicare Other | Admitting: Family Medicine

## 2018-09-14 DIAGNOSIS — R42 Dizziness and giddiness: Secondary | ICD-10-CM

## 2018-09-14 MED ORDER — MECLIZINE HCL 12.5 MG PO TABS: 12.5000 mg | ORAL_TABLET | Freq: Three times a day (TID) | ORAL | 0 refills | Status: DC | PRN

## 2018-09-14 NOTE — Telephone Encounter (Signed)
Medication resent to local pharmacy as requested by pt.

## 2018-09-14 NOTE — Telephone Encounter (Signed)
Pt called and stated that [facility] does not have meclizine (ANTIVERT) 12.5 MG tablet in stock. She is requesting it be sent to her local pharmacy  [facility] [address] [place], [place] - [address] [phone] (Phone) [phone] (Fax)   Please advise pt once this is done.

## 2018-09-18 ENCOUNTER — Ambulatory Visit: Payer: Medicare Other | Admitting: Podiatry

## 2018-09-18 NOTE — Progress Notes (Addendum)
Subjective: Stacey [name] presents today referred by [name], MD with cc of painful, discolored, thick toenails which interfere with daily activities.  Pain is aggravated when wearing enclosed shoe gear.  Patient is also noted to have severe arthritis of her toes and she is also on Coumadin.  She relates seeing a podiatrist in the [place] area previously.  She is accompanied by her caregiver on today.  Past Medical History:  Diagnosis Date  . Atrial fibrillation (HCC)   . Hernia of abdominal cavity   . Kyphosis      Patient Active Problem List   Diagnosis Date Noted  . Conductive hearing loss, bilateral 07/03/2018  . Bilateral impacted cerumen 07/03/2018  . Acute swimmer's ear of left side 07/03/2018  . Long term (current) use of anticoagulants [Z79.01] 07/25/2017  . Chronic atrial fibrillation 07/11/2017  . Primary osteoarthritis involving multiple joints 07/11/2017  . Pseudogout 07/11/2017  . Neuropathy 07/11/2017  . Lymphedema 07/11/2017  . Kyphosis of cervical region 07/11/2017  . Congestive heart failure (HCC) 07/11/2017     Past Surgical History:  Procedure Laterality Date  . ABDOMINAL HYSTERECTOMY    . TONSILLECTOMY        Current Outpatient Medications:  .  atorvastatin (LIPITOR) 10 MG tablet, Take 1 tablet (10 mg total) by mouth daily., Disp: 90 tablet, Rfl: 3 .  cyanocobalamin 1000 MCG tablet, Take 1,000 mcg by mouth daily., Disp: , Rfl:  .  desloratadine (CLARINEX) 5 MG tablet, Take 5 mg by mouth daily., Disp: , Rfl:  .  gabapentin (NEURONTIN) 400 MG capsule, Take 1 capsule (400 mg total) by mouth 3 (three) times daily., Disp: 270 capsule, Rfl: 1 .  metoprolol tartrate (LOPRESSOR) 100 MG tablet, Take 1 tablet (100 mg total) by mouth 2 (two) times daily., Disp: 180 tablet, Rfl: 3 .  MONUROL 3 g PACK, , Disp: , Rfl:  .  Multiple Vitamin (MULTIVITAMIN) capsule, Take 1 capsule by mouth daily., Disp: , Rfl:  .  omeprazole (PRILOSEC) 20 MG capsule,  Take 1 capsule (20 mg total) by mouth daily., Disp: 90 capsule, Rfl: 0 .  potassium chloride SA (K-DUR,KLOR-CON) 20 MEQ tablet, Take 1 tablet (20 mEq total) by mouth daily., Disp: 90 tablet, Rfl: 3 .  prednisoLONE 5 MG TABS tablet, Take 1 tablet (5 mg total) by mouth daily., Disp: 90 tablet, Rfl: 1 .  predniSONE (DELTASONE) 5 MG tablet, Take 5 mg by mouth daily with breakfast., Disp: , Rfl:  .  ranitidine (ZANTAC) 150 MG tablet, Take 150 mg by mouth 2 (two) times daily as needed for heartburn., Disp: , Rfl:  .  spironolactone (ALDACTONE) 25 MG tablet, Take 0.5 tablets (12.5 mg total) by mouth daily., Disp: 90 tablet, Rfl: 3 .  torsemide (DEMADEX) 20 MG tablet, Take 2 tablets (40 mg total) by mouth daily., Disp: 180 tablet, Rfl: 3 .  traMADol (ULTRAM) 50 MG tablet, Take 100 mg by mouth every 6 (six) hours as needed for moderate pain or severe pain., Disp: , Rfl:  .  VITAMIN D, CHOLECALCIFEROL, PO, Take 1,000 Units by mouth daily. , Disp: , Rfl:  .  warfarin (COUMADIN) 5 MG tablet, Take 2.5 mg on Wednesday and Saturday.  5 mg all other days, Disp: 100 tablet, Rfl: 3 .  meclizine (ANTIVERT) 12.5 MG tablet, Take 1 tablet (12.5 mg total) by mouth 3 (three) times daily as needed for dizziness., Disp: 30 tablet, Rfl: 0 .  sertraline (ZOLOFT) 100 MG tablet, TAKE 1 TABLET  DAILY, Disp: 90 tablet, Rfl: 1   Allergies  Allergen Reactions  . Sulfa Antibiotics Anaphylaxis  . Codeine Nausea And Vomiting  . Latex Rash  . Neosporin Original [Bacitracin-Neomycin-Polymyxin] Rash  . Polysporin [Bacitracin-Polymyxin B] Rash     Social History   Occupational History  . Not on file  Tobacco Use  . Smoking status: Former Games developermoker  . Smokeless tobacco: Never Used  Substance and Sexual Activity  . Alcohol use: No    Frequency: Never  . Drug use: No  . Sexual activity: Not on file     History reviewed. No pertinent family history.    There is no immunization history on file for this patient.   Review of  systems: Positive Findings in bold print.  Constitutional:  chills, fatigue, fever, sweats, weight change Communication: Nurse, learning disabilitytranslator, sign Presenter, broadcastinglanguage translator, hand writing, iPad/Android device Head: headaches, head injury Eyes: changes in vision, eye pain, glaucoma, cataracts, macular degeneration, diplopia, glare,  light sensitivity, eyeglasses or contacts, blindness Ears nose mouth throat: Hard of hearing, ringing in ears, deaf, sign language,  vertigo,   nosebleeds,  rhinitis,  cold sores, snoring, swollen glands Cardiovascular: HTN, edema, arrhythmia, pacemaker in place, defibrillator in place,  chest pain/tightness, chronic anticoagulation, blood clot, heart failure Peripheral Vascular: leg cramps, varicose veins, blood clots, lymphedema Respiratory:  difficulty breathing, denies congestion, SOB, wheezing, cough, emphysema Gastrointestinal: change in appetite or weight, abdominal pain, constipation, diarrhea, nausea, vomiting, vomiting blood, change in bowel habits, abdominal pain, jaundice, rectal bleeding, hemorrhoids, Genitourinary:  nocturia,  pain on urination,  blood in urine, Foley catheter, urinary urgency Musculoskeletal: uses mobility aid,  cramping, stiff joints, painful joints, decreased joint motion, fractures, OA, gout Skin: +changes in toenails, color change, dryness, itching, mole changes,  rash  Neurological: headaches, numbness in feet, paresthesias in feet, burning in feet, fainting,  seizures, change in speech. denies headaches, memory problems/poor historian, cerebral palsy, weakness, paralysis, neuropathy Endocrine: diabetes, hypothyroidism, hyperthyroidism,  goiter, dry mouth, flushing, heat intolerance,  cold intolerance,  excessive thirst, denies polyuria,  nocturia Hematological:  easy bleeding, excessive bleeding, easy bruising, enlarged lymph nodes, on long term blood thinner, history of past transusions Allergy/immunological:  hives, eczema, frequent infections,  multiple drug allergies, seasonal allergies, transplant recipient Psychiatric:  anxiety, depression, mood disorder, suicidal ideations, hallucinations   Objective: General: 85 year old Caucasian female, thin build, in no acute distress.  Alert, awake and oriented x3.   Vascular Examination: Capillary refill time less than 3 seconds x 10 digits Dorsalis pedis faintly palpable bilaterally Posterior tibial pulses nonpalpable b/l No digital hair x 10 digits Skin temperature gradient WNL b/l  Dermatological Examination: Healing skin thin and atrophic bilaterally   Toenails 1-5 right foot, and left great toe are discolored, thick, dystrophic with subungual debris and pain with palpation to nailbeds due to thickness of nails.  Anonychia digits 2-5 left foot with nailbeds intact.  Hyperkeratotic lesion posterior right heel and submet head 1 right foot with no erythema, no edema, no drainage, no flocculence.   Partial thickness ulceration dorsal PIPJ 3rd digit left foot. No erythema, no edema, no drainage.   Musculoskeletal: Muscle strength 5/5 to all LE muscle groups  Rigid hammertoe deformity 2-5 b/l  Neurological: Sensation intact with 10 gram monofilament Vibratory sensation intact.  Assessment: 1. Painful onychomycosis toenails 1-5 b/l  2. Calluses right heel, submet 1 right foot 3. Partial thickness ulcer left 3rd digit 4. Patient on long-term chronic anticoagulation    Plan: 1. Discussed onychomycosis and treatment options.  Literature dispensed on today. 2. Toenails 1-5 right, and left great toe  were debrided in length and girth without iatrogenic bleeding. 3. Left 3rd digit cleansed with wound cleanser and triple antibiotic ointment applied with bandaid. She has Amerigel at home and may use it once daily and cover with bandaid. Any redness, drainage, worsening of digit, swelling encountered, call office. Avoid shoes which rub digits.  4. Patient to continue soft,  supportive shoe gear. 5. Patient to report any pedal injuries to medical professional immediately. 6. Follow up 9 weeks.  7. Patient/POA to call should there be a concern in the interim.

## 2018-09-25 ENCOUNTER — Ambulatory Visit (INDEPENDENT_AMBULATORY_CARE_PROVIDER_SITE_OTHER): Payer: Medicare Other | Admitting: General Practice

## 2018-09-25 DIAGNOSIS — Z7901 Long term (current) use of anticoagulants: Secondary | ICD-10-CM

## 2018-09-25 DIAGNOSIS — I482 Chronic atrial fibrillation, unspecified: Secondary | ICD-10-CM

## 2018-09-25 LAB — POCT INR: INR: 2.7 (ref 2.0–3.0)

## 2018-09-25 NOTE — Patient Instructions (Signed)
Pre visit review using our clinic review tool, if applicable. No additional management support is needed unless otherwise documented below in the visit note. 

## 2018-09-26 ENCOUNTER — Other Ambulatory Visit: Payer: Self-pay | Admitting: Family Medicine

## 2018-09-29 ENCOUNTER — Ambulatory Visit (INDEPENDENT_AMBULATORY_CARE_PROVIDER_SITE_OTHER): Payer: Medicare Other | Admitting: Podiatry

## 2018-09-29 ENCOUNTER — Ambulatory Visit (INDEPENDENT_AMBULATORY_CARE_PROVIDER_SITE_OTHER): Payer: Medicare Other

## 2018-09-29 DIAGNOSIS — L02612 Cutaneous abscess of left foot: Secondary | ICD-10-CM

## 2018-09-29 DIAGNOSIS — L97522 Non-pressure chronic ulcer of other part of left foot with fat layer exposed: Secondary | ICD-10-CM

## 2018-09-29 DIAGNOSIS — L03032 Cellulitis of left toe: Secondary | ICD-10-CM | POA: Diagnosis not present

## 2018-09-29 DIAGNOSIS — L97521 Non-pressure chronic ulcer of other part of left foot limited to breakdown of skin: Secondary | ICD-10-CM | POA: Diagnosis not present

## 2018-09-29 MED ORDER — AMOXICILLIN 500 MG PO CAPS: 500.0000 mg | ORAL_CAPSULE | Freq: Three times a day (TID) | ORAL | 0 refills | Status: AC

## 2018-09-30 ENCOUNTER — Encounter: Payer: Self-pay | Admitting: Podiatry
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
## 2018-10-01 ENCOUNTER — Telehealth: Payer: Self-pay | Admitting: *Deleted

## 2018-10-01 NOTE — Telephone Encounter (Signed)
Dr. Eloy End ordered iodosorb, sterile 2x2 gauze, gloves, saline, cotton tip applicators for daily dressing changes to left 3rd toe wound 0.3 x 0.4 x 0.2cm L97.522 with low exucate, from [facility]. Faxed orders and demographics to [facility].

## 2018-10-02 ENCOUNTER — Ambulatory Visit: Payer: Self-pay

## 2018-10-02 NOTE — Telephone Encounter (Signed)
Should be ok

## 2018-10-02 NOTE — Telephone Encounter (Signed)
VM left for [name], patient's daughter.  Patient should continue with normal dosage of coumadin.

## 2018-10-02 NOTE — Telephone Encounter (Signed)
Patient was called and I spoke to her daughter. She says the patient is on amoxicillin for a foot infection and wants to know if she should change how she takes her Coumadin due to being on the amoxicillin. I advised to her daughter that I will send to Dr. Salomon Fick and someone will call with her recommendation, she verbalized understanding.  Reason for Disposition . Caller has NON-URGENT medication question about med that PCP prescribed and triager unable to answer question  Protocols used: MEDICATION QUESTION CALL-A-AH

## 2018-10-02 NOTE — Telephone Encounter (Signed)
Please Advise

## 2018-10-05 ENCOUNTER — Telehealth: Payer: Self-pay | Admitting: Family Medicine

## 2018-10-05 NOTE — Telephone Encounter (Unsigned)
Copied from CRM [id number]. Topic: Quick Communication - Rx Refill/Question >> Oct 05, 2018 11:46 AM Lynne Logan D wrote: Medication: traMADol (ULTRAM) 50 MG tablet / Pt called and stated she is waiting for her refill on Tramadol to come from her mail order pharmacy and that this could take anywhere from 10 days to 2 weeks once received from Dr. Salomon Fick. She would like to know if Dr. Salomon Fick can send in a 10 day or 2 week supply to local pharmacy [name]) until she receives refill from [facility]. Would also like to know if refill has been sent to [facility]. Please advise.  Has the patient contacted their pharmacy? Yes.  (Agent: If no, request that the patient contact the pharmacy for the refill.) (Agent: If yes, when and what did the pharmacy advise?)  Preferred Pharmacy (with phone number or street name): [facility] [address] [place], [place] - [address] [phone] (Phone) [phone] (Fax)    Agent: Please be advised that RX refills may take up to 3 business days. We ask that you follow-up with your pharmacy.

## 2018-10-06 ENCOUNTER — Other Ambulatory Visit: Payer: Self-pay | Admitting: Family Medicine

## 2018-10-06 DIAGNOSIS — I4821 Permanent atrial fibrillation: Secondary | ICD-10-CM | POA: Diagnosis not present

## 2018-10-06 DIAGNOSIS — Z7901 Long term (current) use of anticoagulants: Secondary | ICD-10-CM | POA: Diagnosis not present

## 2018-10-06 MED ORDER — TRAMADOL HCL 50 MG PO TABS: 100.0000 mg | ORAL_TABLET | Freq: Three times a day (TID) | ORAL | 5 refills | Status: DC | PRN

## 2018-10-06 MED ORDER — TRAMADOL HCL 50 MG PO TABS: 100.0000 mg | ORAL_TABLET | Freq: Three times a day (TID) | ORAL | 0 refills | Status: DC | PRN

## 2018-10-06 NOTE — Progress Notes (Signed)
Rx for tramadol sent to pt's mail order pharmacy.  A limited supply sent to pt's local pharmacy until she receives meds from mail order.

## 2018-10-06 NOTE — Telephone Encounter (Signed)
Done

## 2018-10-06 NOTE — Telephone Encounter (Signed)
Please Advise

## 2018-10-07 ENCOUNTER — Ambulatory Visit: Payer: Self-pay

## 2018-10-07 ENCOUNTER — Other Ambulatory Visit: Payer: Self-pay | Admitting: Family Medicine

## 2018-10-07 NOTE — Telephone Encounter (Signed)
Patient called, left VM to return call to the office to discuss symptoms.  I called the patient's daughter [name] and advised if the patient is having problems while taking amoxicillin, she will need to call the provider who prescribed it. She says she will let her mother know.   Message from Debroah Loop sent at 10/07/2018 9:22 AM EST   Summary: trouble sleeping, muscles spasms   Trouble sleeping and muscles spasms all over body when attempting to rest ever since starting amoxicillin

## 2018-10-07 NOTE — Telephone Encounter (Signed)
Copied from CRM [id number]. Topic: Quick Communication - Rx Refill/Question >> Oct 07, 2018 10:55 AM Percival Spanish wrote: Medication traMADol Janean Sark) 50 MG tablet    RX  sent on 10/06/2018 and [facility] told pt they did not received please resend     Preferred Pharmacy [facility]    Agent: Please be advised that RX refills may take up to 3 business days. We ask that you follow-up with your pharmacy.

## 2018-10-08 ENCOUNTER — Ambulatory Visit (INDEPENDENT_AMBULATORY_CARE_PROVIDER_SITE_OTHER): Payer: Medicare Other | Admitting: Podiatry

## 2018-10-08 ENCOUNTER — Telehealth: Payer: Self-pay | Admitting: *Deleted

## 2018-10-08 DIAGNOSIS — L03032 Cellulitis of left toe: Secondary | ICD-10-CM

## 2018-10-08 DIAGNOSIS — I739 Peripheral vascular disease, unspecified: Secondary | ICD-10-CM | POA: Diagnosis not present

## 2018-10-08 DIAGNOSIS — M2042 Other hammer toe(s) (acquired), left foot: Secondary | ICD-10-CM | POA: Diagnosis not present

## 2018-10-08 DIAGNOSIS — L97522 Non-pressure chronic ulcer of other part of left foot with fat layer exposed: Secondary | ICD-10-CM

## 2018-10-08 DIAGNOSIS — L02612 Cutaneous abscess of left foot: Secondary | ICD-10-CM | POA: Diagnosis not present

## 2018-10-08 DIAGNOSIS — Z9229 Personal history of other drug therapy: Secondary | ICD-10-CM

## 2018-10-08 MED ORDER — TRAMADOL HCL 50 MG PO TABS: 100.0000 mg | ORAL_TABLET | Freq: Three times a day (TID) | ORAL | 0 refills | Status: DC | PRN

## 2018-10-08 NOTE — Telephone Encounter (Signed)
Faxed orders to [facility]. 

## 2018-10-08 NOTE — Telephone Encounter (Signed)
-----   Message from Park Liter, DPM sent at 10/08/2018  2:00 PM EST ----- Can we order arterial studies to be performed in the next 2 weeks? Thanks

## 2018-10-08 NOTE — Telephone Encounter (Signed)
Please advise 

## 2018-10-09 ENCOUNTER — Ambulatory Visit (INDEPENDENT_AMBULATORY_CARE_PROVIDER_SITE_OTHER): Payer: Medicare Other | Admitting: General Practice

## 2018-10-09 DIAGNOSIS — I482 Chronic atrial fibrillation, unspecified: Secondary | ICD-10-CM

## 2018-10-09 DIAGNOSIS — Z7901 Long term (current) use of anticoagulants: Secondary | ICD-10-CM | POA: Diagnosis not present

## 2018-10-09 LAB — POCT INR: INR: 2.6 (ref 2.0–3.0)

## 2018-10-09 NOTE — Patient Instructions (Signed)
Pre visit review using our clinic review tool, if applicable. No additional management support is needed unless otherwise documented below in the visit note.  Continue to take 5 mg daily except 2.5 mg on Wednesdays and Saturdays.  Patient's daughter notified that the results must be faxed to Cindy Boyd, RN.  Stacey [name] (dtr) [phone].  Re-check in 1 to 2 weeks.    

## 2018-10-10 NOTE — Progress Notes (Signed)
Subjective:  Patient ID: Stacey [name], female    DOB: August 16, 1932,  MRN: [id number]  Chief Complaint  Patient presents with  . Nail Problem    Left 3rd nail infection f/u. Pt states improvement with left 3rd nail, denies fever/nausea/vomiting/chills.Pt states left third toe is rubbing on left 2nd toe.  . Foot Pain    Pt states pain from laying in bed, pain in bilateral posterior heel, 1 yr duration.    85 y.o. female presents with the above complaint.   Review of Systems: Negative except as noted in the HPI. Denies N/V/F/Ch.  Past Medical History:  Diagnosis Date  . Atrial fibrillation (HCC)   . Hernia of abdominal cavity   . Kyphosis     Current Outpatient Medications:  .  atorvastatin (LIPITOR) 10 MG tablet, TAKE 1 TABLET DAILY, Disp: 90 tablet, Rfl: 1 .  cyanocobalamin 1000 MCG tablet, Take 1,000 mcg by mouth daily., Disp: , Rfl:  .  desloratadine (CLARINEX) 5 MG tablet, Take 5 mg by mouth daily., Disp: , Rfl:  .  gabapentin (NEURONTIN) 400 MG capsule, Take 1 capsule (400 mg total) by mouth 3 (three) times daily., Disp: 270 capsule, Rfl: 1 .  meclizine (ANTIVERT) 12.5 MG tablet, Take 1 tablet (12.5 mg total) by mouth 3 (three) times daily as needed for dizziness., Disp: 30 tablet, Rfl: 0 .  metoprolol tartrate (LOPRESSOR) 100 MG tablet, Take 1 tablet (100 mg total) by mouth 2 (two) times daily., Disp: 180 tablet, Rfl: 3 .  MONUROL 3 g PACK, , Disp: , Rfl:  .  Multiple Vitamin (MULTIVITAMIN) capsule, Take 1 capsule by mouth daily., Disp: , Rfl:  .  omeprazole (PRILOSEC) 20 MG capsule, Take 1 capsule (20 mg total) by mouth daily., Disp: 90 capsule, Rfl: 0 .  potassium chloride SA (K-DUR,KLOR-CON) 20 MEQ tablet, Take 1 tablet (20 mEq total) by mouth daily., Disp: 90 tablet, Rfl: 3 .  prednisoLONE 5 MG TABS tablet, Take 1 tablet (5 mg total) by mouth daily., Disp: 90 tablet, Rfl: 1 .  predniSONE (DELTASONE) 5 MG tablet, Take 5 mg by mouth daily with breakfast., Disp: , Rfl:    .  ranitidine (ZANTAC) 150 MG tablet, Take 150 mg by mouth 2 (two) times daily as needed for heartburn., Disp: , Rfl:  .  sertraline (ZOLOFT) 100 MG tablet, TAKE 1 TABLET DAILY, Disp: 90 tablet, Rfl: 1 .  spironolactone (ALDACTONE) 25 MG tablet, Take 0.5 tablets (12.5 mg total) by mouth daily., Disp: 90 tablet, Rfl: 3 .  torsemide (DEMADEX) 20 MG tablet, Take 2 tablets (40 mg total) by mouth daily., Disp: 180 tablet, Rfl: 3 .  traMADol (ULTRAM) 50 MG tablet, Take 2 tablets (100 mg total) by mouth 3 (three) times daily as needed for moderate pain or severe pain., Disp: 120 tablet, Rfl: 5 .  traMADol (ULTRAM) 50 MG tablet, Take 2 tablets (100 mg total) by mouth 3 (three) times daily as needed., Disp: 30 tablet, Rfl: 0 .  VITAMIN D, CHOLECALCIFEROL, PO, Take 1,000 Units by mouth daily. , Disp: , Rfl:  .  warfarin (COUMADIN) 5 MG tablet, Take 2.5 mg on Wednesday and Saturday.  5 mg all other days, Disp: 100 tablet, Rfl: 3  Social History   Tobacco Use  Smoking Status Former Smoker  Smokeless Tobacco Never Used    Allergies  Allergen Reactions  . Sulfa Antibiotics Anaphylaxis  . Codeine Nausea And Vomiting  . Latex Rash  . Neosporin Original [Bacitracin-Neomycin-Polymyxin] Rash  .  Polysporin [Bacitracin-Polymyxin B] Rash   Objective:  There were no vitals filed for this visit. There is no height or weight on file to calculate BMI. Constitutional Well developed. Well nourished.  Vascular Dorsalis pedis pulses palpable bilaterally. Posterior tibial pulses palpable bilaterally. Capillary refill normal to all digits.  No cyanosis or clubbing noted. Pedal hair growth normal.  Neurologic Normal speech. Oriented to person, place, and time. Epicritic sensation to light touch grossly present bilaterally.  Dermatologic Nails well groomed and normal in appearance. Small ulceration dorsal PIPJ left third toe with fibro-granular base No skin lesions.  Orthopedic: Lesser digital contractures  left foot.  Second third toe with overriding toe deformity   Radiographs: None today Assessment:   1. Hammertoe of left foot   2. Cellulitis and abscess of toe of left foot   3. Skin ulcer of third toe of left foot with fat layer exposed (HCC)   4. Hx of long term use of blood thinners   5. PAD (peripheral artery disease) (HCC)    Plan:  Patient was evaluated and treated and all questions answered.  Hammertoes left foot -Discussed the patient underlying biomechanical etiology behind his deformities and discussed that the ulceration will likely continue until the hammertoe deformity is corrected.  While the ulceration has improved I think it is a good time to proceed with correction hammertoes. -Patient has failed all conservative therapy and wishes to proceed with surgical intervention. All risks, benefits, and alternatives discussed with patient. No guarantees given. Consent reviewed and signed by patient. -Planned procedures: Correction of hammertoes left second, third toes possible adjacent tissue transfer  PAD -Though pulses palpable excessive distal cooling has concern for microcirculation.  Will order noninvasive vascular studies to ascertain toe brachial index  25 minutes of face to face time were spent with the patient. >50% of this was spent on counseling and coordination of care. Specifically discussed with patient the above surgical plan and need for vascular studies prior to surgery  Return in about 2 weeks (around 10/22/2018) for Review Arterial studies.

## 2018-10-12 ENCOUNTER — Telehealth: Payer: Self-pay | Admitting: Podiatry

## 2018-10-12 NOTE — Telephone Encounter (Signed)
Left message letting pt know she doesn't need to sign a medical records release form as Dr. Salomon Fick is also part of Epic so they should be able to see our notes in her chart. Told pt to call us back with any other questions.

## 2018-10-12 NOTE — Telephone Encounter (Signed)
I was in on 27 February to see Dr. Samuella Cota and he requested I have an arterial study done on my foot. Dr. Salomon Fick is requiring a medical records release from you before they can schedule me an appointment. Please call me back at [phone].

## 2018-10-15 ENCOUNTER — Telehealth: Payer: Self-pay | Admitting: *Deleted

## 2018-10-15 NOTE — Telephone Encounter (Signed)
"  I'm Stacey [name]'s Caregiver, we're returning your call."  I was calling to schedule her surgery.  "Well she is scheduled to see her primary care doctor next week and we still have not received Cardiac clearance yet.  She has that appointment coming up too.  Can we just give you a call when we get cleared from these doctors?"  Yes, that will be fine.

## 2018-10-15 NOTE — Telephone Encounter (Signed)
I attempted to call the patient to see if she would like to do her surgery on November 06, 2018.  I left her messages to give me a call back.

## 2018-10-18 ENCOUNTER — Other Ambulatory Visit: Payer: Self-pay | Admitting: Family Medicine

## 2018-10-19 NOTE — Telephone Encounter (Signed)
Please advise 

## 2018-10-20 NOTE — Telephone Encounter (Signed)
I don't think I prescribe her Torsemide. What is the current dose? I think it needs to be refilled but we need to know if we or someone else is in charge.

## 2018-10-20 NOTE — Telephone Encounter (Signed)
Okay to fill? 

## 2018-10-20 NOTE — Telephone Encounter (Signed)
You do control her Torsemide.  You changed it to 40mg  QD back in October due to BNP being elevated.

## 2018-10-21 ENCOUNTER — Ambulatory Visit: Payer: Medicare Other | Admitting: Family Medicine

## 2018-10-23 ENCOUNTER — Ambulatory Visit (INDEPENDENT_AMBULATORY_CARE_PROVIDER_SITE_OTHER): Payer: Medicare Other | Admitting: General Practice

## 2018-10-23 DIAGNOSIS — Z7901 Long term (current) use of anticoagulants: Secondary | ICD-10-CM

## 2018-10-23 DIAGNOSIS — I482 Chronic atrial fibrillation, unspecified: Secondary | ICD-10-CM

## 2018-10-23 LAB — POCT INR: INR: 2.5 (ref 2.0–3.0)

## 2018-10-23 NOTE — Patient Instructions (Signed)
Pre visit review using our clinic review tool, if applicable. No additional management support is needed unless otherwise documented below in the visit note.  Continue to take 5 mg daily except 2.5 mg on Wednesdays and Saturdays.  Patient's daughter notified that the results must be faxed to Cindy Liddie Chichester, RN.  Janice (dtr) [phone].  Re-check in 1 to 2 weeks.    

## 2018-10-26 ENCOUNTER — Telehealth: Payer: Self-pay | Admitting: *Deleted

## 2018-10-26 NOTE — Telephone Encounter (Signed)
"  I'm calling to cancel [facility] Stacey [name]'s surgery.  She is not scheduled to have it yet but she has decided she's not going to do it."  I'll let Dr. Samuella Cota know.

## 2018-11-03 ENCOUNTER — Inpatient Hospital Stay (HOSPITAL_COMMUNITY): Admission: RE | Admit: 2018-11-03 | Payer: Medicare Other | Source: Ambulatory Visit

## 2018-11-06 ENCOUNTER — Ambulatory Visit: Payer: Medicare Other | Admitting: Podiatry

## 2018-11-10 ENCOUNTER — Ambulatory Visit (INDEPENDENT_AMBULATORY_CARE_PROVIDER_SITE_OTHER): Payer: Medicare Other | Admitting: General Practice

## 2018-11-10 DIAGNOSIS — I482 Chronic atrial fibrillation, unspecified: Secondary | ICD-10-CM

## 2018-11-10 DIAGNOSIS — Z7901 Long term (current) use of anticoagulants: Secondary | ICD-10-CM

## 2018-11-10 LAB — POCT INR: INR: 2.6 (ref 2.0–3.0)

## 2018-11-10 NOTE — Patient Instructions (Signed)
Pre visit review using our clinic review tool, if applicable. No additional management support is needed unless otherwise documented below in the visit note.  Continue to take 5 mg daily except 2.5 mg on Wednesdays and Saturdays.  Patient's daughter notified that the results must be faxed to Cindy Eliberto Sole, RN.  Janice (dtr) [phone].  Re-check in 1 to 2 weeks.    

## 2018-11-13 ENCOUNTER — Other Ambulatory Visit: Payer: Self-pay | Admitting: Family Medicine

## 2018-11-16 ENCOUNTER — Telehealth: Payer: Self-pay | Admitting: Nurse Practitioner

## 2018-11-16 NOTE — Telephone Encounter (Signed)
OV changed to Virtual and appt notes edited

## 2018-11-16 NOTE — Telephone Encounter (Signed)
Have left messages on both home and cell - regarding upcoming OV on Monday 4/13 - will plan to do virtual visit at 2:30 on 4/13.   Rosalio Macadamia, RN, ANP-C [facility] [address] [place], [place]  [address] [phone]

## 2018-11-19 ENCOUNTER — Telehealth: Payer: Self-pay | Admitting: *Deleted

## 2018-11-19 NOTE — Telephone Encounter (Signed)
Virtual Visit Pre-Appointment Phone Call  Steps For Call:  1. Confirm consent - "In the setting of the current Covid19 crisis, you are scheduled for a (phone or video) visit with your provider on (Monday, April 13 at (2:30 pm).  Just as we do with many in-office visits, in order for you to participate in this visit, we must obtain consent.  If you'd like, I can send this to your mychart (if signed up) or email for you to review.  Otherwise, I can obtain your verbal consent now.  All virtual visits are billed to your insurance company just like a normal visit would be.  By agreeing to a virtual visit, we'd like you to understand that the technology does not allow for your provider to perform an examination, and thus may limit your provider's ability to fully assess your condition.  Finally, though the technology is pretty good, we cannot assure that it will always work on either your or our end, and in the setting of a video visit, we may have to convert it to a phone-only visit.  In either situation, we cannot ensure that we have a secure connection.  Are you willing to proceed?"  2. Give patient instructions for WebEx download to smartphone as below if video visit  3. Advise patient to be prepared with any vital sign or heart rhythm information, their current medicines, and a piece of paper and pen handy for any instructions they may receive the day of their visit  4. Inform patient they will receive a phone call 15 minutes prior to their appointment time (may be from unknown caller ID) so they should be prepared to answer  5. Confirm that appointment type is correct in Epic appointment notes (video vs telephone)    TELEPHONE CALL NOTE  Stacey [name] has been deemed a candidate for a follow-up tele-health visit to limit community exposure during the Covid-19 pandemic. I spoke with the patient via phone to ensure availability of phone/video source, confirm preferred email & phone  number, and discuss instructions and expectations.  I reminded Stacey [name] to be prepared with any vital sign and/or heart rhythm information that could potentially be obtained via home monitoring, at the time of her visit. I reminded Stacey [name] to expect a phone call at the time of her visit if her visit.  Did the patient verbally acknowledge consent to treatment? No will call on Monday day of visit to get consent.  LVM  [name] 11/19/2018 9:31 AM   DOWNLOADING THE WEBEX SOFTWARE TO SMARTPHONE  - If Apple, go to [employer] and type in WebEx in the search bar. Download [employer], the blue/green circle. The app is free but as with any other app downloads, their phone may require them to verify saved payment information or Apple password. The patient does NOT have to create an account.  - If Android, ask patient to go to [employer] and type in WebEx in the search bar. Download [employer], the blue/green circle. The app is free but as with any other app downloads, their phone may require them to verify saved payment information or Android password. The patient does NOT have to create an account.   CONSENT FOR TELE-HEALTH VISIT - PLEASE REVIEW  I hereby voluntarily request, consent and authorize [facility] and its employed or contracted physicians, Producer, television/film/video, nurse practitioners or other licensed health care professionals [name], NP), to provide  me with telemedicine health care services (Phone) as deemed necessary by the treating Practitioner. I acknowledge and consent to receive the Services by the Practitioner via telemedicine. I understand that the telemedicine visit will involve communicating with the Practitioner through live audiovisual communication technology and the disclosure of certain medical information by electronic transmission. I acknowledge that I have been given the opportunity to request an in-person  assessment or other available alternative prior to the telemedicine visit and am voluntarily participating in the telemedicine visit.  I understand that I have the right to withhold or withdraw my consent to the use of telemedicine in the course of my care at any time, without affecting my right to future care or treatment, and that the Practitioner or I may terminate the telemedicine visit at any time. I understand that I have the right to inspect all information obtained and/or recorded in the course of the telemedicine visit and may receive copies of available information for a reasonable fee.  I understand that some of the potential risks of receiving the Services via telemedicine include:  Marland Kitchen Delay or interruption in medical evaluation due to technological equipment failure or disruption; . Information transmitted may not be sufficient (e.g. poor resolution of images) to allow for appropriate medical decision making by the Practitioner; and/or  . In rare instances, security protocols could fail, causing a breach of personal health information.  Furthermore, I acknowledge that it is my responsibility to provide information about my medical history, conditions and care that is complete and accurate to the best of my ability. I acknowledge that Practitioner's advice, recommendations, and/or decision may be based on factors not within their control, such as incomplete or inaccurate data provided by me or distortions of diagnostic images or specimens that may result from electronic transmissions. I understand that the practice of medicine is not an exact science and that Practitioner makes no warranties or guarantees regarding treatment outcomes. I acknowledge that I will receive a copy of this consent concurrently upon execution via email to the email address I last provided but may also request a printed copy by calling the office of [facility].    I understand that my insurance will be billed for this  visit.   I have read or had this consent read to me. . I understand the contents of this consent, which adequately explains the benefits and risks of the Services being provided via telemedicine.  . I have been provided ample opportunity to ask questions regarding this consent and the Services and have had my questions answered to my satisfaction. . I give my informed consent for the services to be provided through the use of telemedicine in my medical care  By participating in this telemedicine visit I agree to the above.

## 2018-11-20 LAB — POCT INR: INR: 2.8 (ref 2.0–3.0)

## 2018-11-22 NOTE — Progress Notes (Signed)
Telehealth Visit     Virtual Visit via Video Note   This visit type was conducted due to national recommendations for restrictions regarding the COVID-19 Pandemic (e.g. social distancing) in an effort to limit this patient's exposure and mitigate transmission in our community.  Due to her co-morbid illnesses, this patient is at least at moderate risk for complications without adequate follow up.  This format is felt to be most appropriate for this patient at this time.  All issues noted in this document were discussed and addressed.  A limited physical exam was performed with this format.  Please refer to the patient's chart for her consent to telehealth for [facility].   Evaluation Performed:  Follow-up visit  This visit type was conducted due to national recommendations for restrictions regarding the COVID-19 Pandemic (e.g. social distancing).  This format is felt to be most appropriate for this patient at this time.  All issues noted in this document were discussed and addressed.  No physical exam was performed (except for noted visual exam findings with Video Visits).  Please refer to the patient's chart (MyChart message for video visits and phone note for telephone visits) for the patient's consent to telehealth for [facility]  Date:  11/23/2018   ID:  Stacey, [name] May 09, 1933, MRN [id number]  Patient Location:  Home  Provider location:   Home  PCP:  [name], MD  Cardiologist:  Tyrone Sage Lesleigh Noe, MD  Electrophysiologist:  None   Chief Complaint:  Follow up visit.   History of Present Illness:    Stacey [name] is a 85 y.o. female who presents via audio/video conferencing for a telehealth visit today.  Seen for Dr. Katrinka Blazing.   She has a history of persistent AF - on anticoagulation and chronic shortness of breath. She reports prior GIB with Xarelto requiring transfusion - has thus transitioned back to Coumadin.   Last seen in October with  shortness of breath - limited records from her physician in [place] - her diuretic had previously been cut back and correlated with worsening symptoms of shortness of breath. Echo was updated - this was reassuring.   The patient does not have symptoms concerning for COVID-19 infection (fever, chills, cough, or new shortness of breath).   Seen today via Doxim[employer] video - this was done thru her granddaughter's smart phone. No real concerns. She does not check her BP - her wellness check has been cancelled due to COVID - this is when she typically gets her BP checked. No chest pain. Breathing is stable - stable shortness of breath. No palpitations. Some bruising - nothing excessive and no bleeding. Coumadin is checked by home monitoring. She is mostly limited by her back/scoliolis. Overall, she feels like she is doing ok.   Past Medical History:  Diagnosis Date  . Atrial fibrillation (HCC)   . Hernia of abdominal cavity   . Kyphosis    Past Surgical History:  Procedure Laterality Date  . ABDOMINAL HYSTERECTOMY    . TONSILLECTOMY       Current Meds  Medication Sig  . atorvastatin (LIPITOR) 10 MG tablet TAKE 1 TABLET DAILY  . cyanocobalamin 1000 MCG tablet Take 1,000 mcg by mouth daily.  Marland Kitchen desloratadine (CLARINEX) 5 MG tablet Take 5 mg by mouth daily.  Marland Kitchen gabapentin (NEURONTIN) 400 MG capsule Take 1 capsule (400 mg total) by mouth 3 (three) times daily.  . meclizine (ANTIVERT) 12.5 MG tablet Take 1 tablet (12.5 mg  total) by mouth 3 (three) times daily as needed for dizziness.  . metoprolol tartrate (LOPRESSOR) 100 MG tablet Take 1 tablet (100 mg total) by mouth 2 (two) times daily.  Marland Kitchen MONUROL 3 g PACK   . Multiple Vitamin (MULTIVITAMIN) capsule Take 1 capsule by mouth daily.  Marland Kitchen omeprazole (PRILOSEC) 20 MG capsule Take 20 mg by mouth 2 (two) times daily before a meal.  . potassium chloride SA (K-DUR,KLOR-CON) 20 MEQ tablet Take 1 tablet (20 mEq total) by mouth daily.  . prednisoLONE 5 MG TABS  tablet Take 1 tablet (5 mg total) by mouth daily.  . sertraline (ZOLOFT) 100 MG tablet TAKE 1 TABLET DAILY  . spironolactone (ALDACTONE) 25 MG tablet TAKE ONE-HALF (1/2) TABLET DAILY  . torsemide (DEMADEX) 20 MG tablet TAKE 2 TABLETS DAILY  . traMADol (ULTRAM) 50 MG tablet Take 2 tablets (100 mg total) by mouth 3 (three) times daily as needed for moderate pain or severe pain.  Marland Kitchen VITAMIN D, CHOLECALCIFEROL, PO Take 1,000 Units by mouth daily.   Marland Kitchen warfarin (COUMADIN) 5 MG tablet Take 2.5 mg on Wednesday and Saturday.  5 mg all other days  . [DISCONTINUED] omeprazole (PRILOSEC) 20 MG capsule Take 1 capsule (20 mg total) by mouth daily.     Allergies:   Sulfa antibiotics; Codeine; Latex; Neosporin original [bacitracin-neomycin-polymyxin]; and Polysporin [bacitracin-polymyxin b]   Social History   Tobacco Use  . Smoking status: Former Games developer  . Smokeless tobacco: Never Used  Substance Use Topics  . Alcohol use: No    Frequency: Never  . Drug use: No     Family Hx: The patient's family history is not on file.  ROS:   Please see the history of present illness.   All other systems reviewed are negative except for none.    Objective:    Vital Signs:  Wt 106 lb 8 oz (48.3 kg)   BMI 18.87 kg/m    Wt Readings from Last 3 Encounters:  11/23/18 106 lb 8 oz (48.3 kg)  07/30/18 109 lb (49.4 kg)  06/26/18 110 lb (49.9 kg)    Alert. She is in no acute distress. Color looks good.  Not short of breath with conversation.   Labs/Other Tests and Data Reviewed:    Lab Results  Component Value Date   WBC 5.9 05/27/2018   HGB 11.0 (L) 05/27/2018   HCT 33.0 (L) 05/27/2018   PLT 250 05/27/2018   GLUCOSE 95 06/18/2018   ALT 18 07/28/2017   AST 34 07/28/2017   NA 137 06/18/2018   K 3.7 06/18/2018   CL 94 (L) 06/18/2018   CREATININE 0.89 06/18/2018   BUN 16 06/18/2018   CO2 26 06/18/2018   INR 2.6 11/10/2018      BNP (last 3 results) No results for input(s): BNP in the last 8760  hours.  ProBNP (last 3 results) Recent Labs    05/27/18 1144 06/10/18 1130  PROBNP 2,827* 1,782*    Prior CV studies:    The following studies were reviewed today:  Echo Study Conclusions 05/2018 - Left ventricle: The cavity size was normal. Systolic function was   normal. The estimated ejection fraction was in the range of 60%   to 65%. Wall motion was normal; there were no regional wall   motion abnormalities. The study is not technically sufficient to   allow evaluation of LV diastolic function. - Aortic valve: Sclerosis without stenosis. - Mitral valve: Calcified annulus. Moderately thickened, mildly   calcified leaflets .  There was mild regurgitation. - Left atrium: The atrium was moderately dilated. - Right ventricle: The cavity size was mildly dilated. Wall   thickness was normal. - Right atrium: The atrium was moderately dilated. - Atrial septum: No defect or patent foramen ovale was identified. - Tricuspid valve: There was moderate regurgitation.   ASSESSMENT & PLAN:    1.  Persistent AF - managed with rate control - no complaints of palpitations. Appears to be tolerating her current regimen.   2. Chronic dyspnea - most recent echo was reassuring - she notes her breathing is stable at this time.   3. High risk medicine - no obvious issues noted.   4. COVID-19 Education: The signs and symptoms of COVID-19 were discussed with the patient and how to seek care for testing (follow up with PCP or arrange E-visit).  The importance of social distancing, staying at home and hand hygiene were discussed today.  Patient Risk:   After full review of this patient's clinical status, I feel that they are at least moderate risk at this time.  Time:   Today, I have spent 10 minutes with the patient with telehealth technology discussing the above issues.     Medication Adjustments/Labs and Tests Ordered: Current medicines are reviewed at length with the patient today.   Concerns regarding medicines are outlined above.   Tests Ordered: No orders of the defined types were placed in this encounter.   Medication Changes: No orders of the defined types were placed in this encounter.   Disposition:  FU with Dr. Katrinka BlazingSmith as planned per recall in October.    Patient is agreeable to this plan and will call if any problems develop in the interim.   Avelina LaineSigned, Chukwudi Ewen, NP  11/23/2018 2:52 PM    [facility]

## 2018-11-23 ENCOUNTER — Other Ambulatory Visit: Payer: Self-pay

## 2018-11-23 ENCOUNTER — Telehealth: Payer: Self-pay | Admitting: *Deleted

## 2018-11-23 ENCOUNTER — Encounter: Payer: Self-pay | Admitting: Nurse Practitioner

## 2018-11-23 ENCOUNTER — Telehealth (INDEPENDENT_AMBULATORY_CARE_PROVIDER_SITE_OTHER): Payer: Medicare Other | Admitting: Nurse Practitioner

## 2018-11-23 VITALS — Wt 106.5 lb

## 2018-11-23 DIAGNOSIS — I482 Chronic atrial fibrillation, unspecified: Secondary | ICD-10-CM

## 2018-11-23 DIAGNOSIS — Z7901 Long term (current) use of anticoagulants: Secondary | ICD-10-CM | POA: Diagnosis not present

## 2018-11-23 DIAGNOSIS — Z7189 Other specified counseling: Secondary | ICD-10-CM

## 2018-11-23 DIAGNOSIS — R0602 Shortness of breath: Secondary | ICD-10-CM

## 2018-11-23 NOTE — Telephone Encounter (Signed)
Virtual Visit Pre-Appointment Phone Call  Steps For Call:  1. Confirm consent - "In the setting of the current Covid19 crisis, you are scheduled for a (Doximity on granddaughters phone @ [phone]) visit with your provider on (Monday,April  13 ) at (2:30 pm).  Just as we do with many in-office visits, in order for you to participate in this visit, we must obtain consent.  If you'd like, I can send this to your mychart (if signed up) or email for you to review.  Otherwise, I can obtain your verbal consent now.  All virtual visits are billed to your insurance company just like a normal visit would be.  By agreeing to a virtual visit, we'd like you to understand that the technology does not allow for your provider to perform an examination, and thus may limit your provider's ability to fully assess your condition.  Finally, though the technology is pretty good, we cannot assure that it will always work on either your or our end, and in the setting of a video visit, we may have to convert it to a phone-only visit.  In either situation, we cannot ensure that we have a secure connection.  Are you willing to proceed?"  2. Give patient instructions for WebEx download to smartphone as below if video visit  3. Advise patient to be prepared with any vital sign or heart rhythm information, their current medicines, and a piece of paper and pen handy for any instructions they may receive the day of their visit  4. Inform patient they will receive a phone call 15 minutes prior to their appointment time (may be from unknown caller ID) so they should be prepared to answer  5. Confirm that appointment type is correct in Epic appointment notes (video vs telephone)    TELEPHONE CALL NOTE  Stacey [name] has been deemed a candidate for a follow-up tele-health visit to limit community exposure during the Covid-19 pandemic. I spoke with the patient via phone to ensure availability of phone/video source,  confirm preferred email & phone number, and discuss instructions and expectations.  I reminded Stacey [name] to be prepared with any vital sign and/or heart rhythm information that could potentially be obtained via home monitoring, at the time of her visit. I reminded Stacey [name] to expect a phone call at the time of her visit if her visit.  Did the patient verbally acknowledge consent to treatment? Yes, using granddaughters phone with consent also from pt.[phone]  Emmit PomfretDanielle Robin Latora Quarry 11/23/2018 2:39 PM   DOWNLOADING THE WEBEX SOFTWARE TO SMARTPHONE  - If Apple, go to [employer] Store and type in WebEx in the search bar. Download [employer] Corporation[employer]ex Meetings, the blue/green circle. The app is free but as with any other app downloads, their phone may require them to verify saved payment information or Apple password. The patient does NOT have to create an account.  - If Android, ask patient to go to [employer] Play Store and type in WebEx in the search bar. Download [employer]Webex Meetings, the blue/green circle. The app is free but as with any other app downloads, their phone may require them to verify saved payment information or Android password. The patient does NOT have to create an account.   CONSENT FOR TELE-HEALTH VISIT - PLEASE REVIEW  I hereby voluntarily request, consent and authorize [facility] and its employed or contracted physicians, physician assistants, nurse practitioners or other licensed health care professionals (the Practitioner), to provide me  with telemedicine health care services (the "Services") as deemed necessary by the treating Practitioner. I acknowledge and consent to receive the Services by the Practitioner via telemedicine. I understand that the telemedicine visit will involve communicating with the Practitioner through live audiovisual communication technology and the disclosure of certain medical information by electronic transmission. I acknowledge that I have  been given the opportunity to request an in-person assessment or other available alternative prior to the telemedicine visit and am voluntarily participating in the telemedicine visit.  I understand that I have the right to withhold or withdraw my consent to the use of telemedicine in the course of my care at any time, without affecting my right to future care or treatment, and that the Practitioner or I may terminate the telemedicine visit at any time. I understand that I have the right to inspect all information obtained and/or recorded in the course of the telemedicine visit and may receive copies of available information for a reasonable fee.  I understand that some of the potential risks of receiving the Services via telemedicine include:  Marland Kitchen Delay or interruption in medical evaluation due to technological equipment failure or disruption; . Information transmitted may not be sufficient (e.g. poor resolution of images) to allow for appropriate medical decision making by the Practitioner; and/or  . In rare instances, security protocols could fail, causing a breach of personal health information.  Furthermore, I acknowledge that it is my responsibility to provide information about my medical history, conditions and care that is complete and accurate to the best of my ability. I acknowledge that Practitioner's advice, recommendations, and/or decision may be based on factors not within their control, such as incomplete or inaccurate data provided by me or distortions of diagnostic images or specimens that may result from electronic transmissions. I understand that the practice of medicine is not an exact science and that Practitioner makes no warranties or guarantees regarding treatment outcomes. I acknowledge that I will receive a copy of this consent concurrently upon execution via email to the email address I last provided but may also request a printed copy by calling the office of [facility].    I  understand that my insurance will be billed for this visit.   I have read or had this consent read to me. . I understand the contents of this consent, which adequately explains the benefits and risks of the Services being provided via telemedicine.  . I have been provided ample opportunity to ask questions regarding this consent and the Services and have had my questions answered to my satisfaction. . I give my informed consent for the services to be provided through the use of telemedicine in my medical care  By participating in this telemedicine visit I agree to the above.

## 2018-11-23 NOTE — Patient Instructions (Addendum)
After Visit Summary:  We will be checking the following labs today - NONE   Medication Instructions:    Continue with your current medicines.    If you need a refill on your cardiac medications before your next appointment, please call your pharmacy.     Testing/Procedures To Be Arranged:  N/A  Follow-Up:   See Dr. Katrinka Blazing as planned - has recall for October 2020    At [facility], you and your health needs are our priority.  As part of our continuing mission to provide you with exceptional heart care, we have created designated Provider Care Teams.  These Care Teams include your primary Cardiologist (physician) and Advanced Practice Providers (APPs -  Physician Assistants and Nurse Practitioners) who all work together to provide you with the care you need, when you need it.  Special Instructions:  . Stay safe, stay home and wash your hands for at least 20 seconds! . It was good to see you today.    Call the [facility] office at [phone] if you have any questions, problems or concerns.

## 2018-11-24 ENCOUNTER — Ambulatory Visit (INDEPENDENT_AMBULATORY_CARE_PROVIDER_SITE_OTHER): Payer: Medicare Other | Admitting: General Practice

## 2018-11-24 DIAGNOSIS — I482 Chronic atrial fibrillation, unspecified: Secondary | ICD-10-CM

## 2018-11-24 DIAGNOSIS — Z7901 Long term (current) use of anticoagulants: Secondary | ICD-10-CM

## 2018-11-24 NOTE — Patient Instructions (Signed)
Pre visit review using our clinic review tool, if applicable. No additional management support is needed unless otherwise documented below in the visit note.  Continue to take 5 mg daily except 2.5 mg on Wednesdays and Saturdays.  Patient's daughter notified that the results must be faxed to Cindy Deanthony Maull, RN.  Janice (dtr) [phone].  Re-check in 1 to 2 weeks.    

## 2018-12-05 DIAGNOSIS — I4821 Permanent atrial fibrillation: Secondary | ICD-10-CM | POA: Diagnosis not present

## 2018-12-05 DIAGNOSIS — Z7901 Long term (current) use of anticoagulants: Secondary | ICD-10-CM | POA: Diagnosis not present

## 2018-12-08 ENCOUNTER — Ambulatory Visit (INDEPENDENT_AMBULATORY_CARE_PROVIDER_SITE_OTHER): Payer: Medicare Other | Admitting: General Practice

## 2018-12-08 DIAGNOSIS — Z7901 Long term (current) use of anticoagulants: Secondary | ICD-10-CM | POA: Diagnosis not present

## 2018-12-08 DIAGNOSIS — I482 Chronic atrial fibrillation, unspecified: Secondary | ICD-10-CM | POA: Diagnosis not present

## 2018-12-08 LAB — POCT INR: INR: 2.1 (ref 2.0–3.0)

## 2018-12-08 NOTE — Patient Instructions (Signed)
Pre visit review using our clinic review tool, if applicable. No additional management support is needed unless otherwise documented below in the visit note.  Continue to take 5 mg daily except 2.5 mg on Wednesdays and Saturdays.  Patient's daughter notified that the results must be faxed to Cindy Boyd, RN.  Janice (dtr) [phone].  Re-check in 1 to 2 weeks.    

## 2018-12-09 DIAGNOSIS — M2550 Pain in unspecified joint: Secondary | ICD-10-CM | POA: Diagnosis not present

## 2018-12-09 DIAGNOSIS — M1120 Other chondrocalcinosis, unspecified site: Secondary | ICD-10-CM | POA: Diagnosis not present

## 2018-12-09 DIAGNOSIS — M150 Primary generalized (osteo)arthritis: Secondary | ICD-10-CM | POA: Diagnosis not present

## 2018-12-09 DIAGNOSIS — M154 Erosive (osteo)arthritis: Secondary | ICD-10-CM | POA: Diagnosis not present

## 2018-12-09 DIAGNOSIS — Z7952 Long term (current) use of systemic steroids: Secondary | ICD-10-CM | POA: Diagnosis not present

## 2018-12-11 ENCOUNTER — Ambulatory Visit: Payer: Medicare Other | Admitting: Podiatry

## 2018-12-22 ENCOUNTER — Ambulatory Visit (INDEPENDENT_AMBULATORY_CARE_PROVIDER_SITE_OTHER): Payer: Medicare Other | Admitting: General Practice

## 2018-12-22 DIAGNOSIS — I482 Chronic atrial fibrillation, unspecified: Secondary | ICD-10-CM

## 2018-12-22 DIAGNOSIS — Z7901 Long term (current) use of anticoagulants: Secondary | ICD-10-CM

## 2018-12-22 LAB — POCT INR: INR: 2.7 (ref 2.0–3.0)

## 2018-12-22 NOTE — Patient Instructions (Signed)
Pre visit review using our clinic review tool, if applicable. No additional management support is needed unless otherwise documented below in the visit note.  Continue to take 5 mg daily except 2.5 mg on Wednesdays and Saturdays.  Patient's daughter notified that the results must be faxed to Cindy Boyd, RN.  Janice (dtr) [phone].  Re-check in 1 to 2 weeks.    

## 2019-01-05 ENCOUNTER — Ambulatory Visit (INDEPENDENT_AMBULATORY_CARE_PROVIDER_SITE_OTHER): Payer: Medicare Other | Admitting: General Practice

## 2019-01-05 DIAGNOSIS — Z7901 Long term (current) use of anticoagulants: Secondary | ICD-10-CM

## 2019-01-05 DIAGNOSIS — I482 Chronic atrial fibrillation, unspecified: Secondary | ICD-10-CM | POA: Diagnosis not present

## 2019-01-05 LAB — POCT INR: INR: 2.4 (ref 2.0–3.0)

## 2019-01-05 NOTE — Patient Instructions (Signed)
Pre visit review using our clinic review tool, if applicable. No additional management support is needed unless otherwise documented below in the visit note.  Continue to take 5 mg daily except 2.5 mg on Wednesdays and Saturdays.  Patient's daughter notified that the results must be faxed to Cindy Kimorah Ridolfi, RN.  Janice (dtr) [phone].  Re-check in 1 to 2 weeks.    

## 2019-01-07 ENCOUNTER — Telehealth: Payer: Self-pay | Admitting: *Deleted

## 2019-01-07 NOTE — Telephone Encounter (Signed)
Copied from CRM [id number]. Topic: General - Inquiry >> Jan 07, 2019  1:23 PM Angela Nevin wrote: Reason for CRM: Patient would like to know if paperwork for new handicap packard requires an office visit or if pt can mail it to office.

## 2019-01-08 NOTE — Telephone Encounter (Signed)
Called pt left detailed message  for pt to mail her placard to the office for completing

## 2019-01-20 ENCOUNTER — Ambulatory Visit (INDEPENDENT_AMBULATORY_CARE_PROVIDER_SITE_OTHER): Payer: Medicare Other | Admitting: Podiatry

## 2019-01-20 ENCOUNTER — Encounter: Payer: Self-pay | Admitting: Podiatry

## 2019-01-20 ENCOUNTER — Other Ambulatory Visit: Payer: Self-pay | Admitting: Podiatry

## 2019-01-20 ENCOUNTER — Other Ambulatory Visit: Payer: Self-pay

## 2019-01-20 ENCOUNTER — Ambulatory Visit (INDEPENDENT_AMBULATORY_CARE_PROVIDER_SITE_OTHER): Payer: Medicare Other

## 2019-01-20 DIAGNOSIS — M79674 Pain in right toe(s): Secondary | ICD-10-CM | POA: Diagnosis not present

## 2019-01-20 DIAGNOSIS — M05772 Rheumatoid arthritis with rheumatoid factor of left ankle and foot without organ or systems involvement: Secondary | ICD-10-CM

## 2019-01-20 DIAGNOSIS — M79672 Pain in left foot: Secondary | ICD-10-CM

## 2019-01-20 DIAGNOSIS — L97522 Non-pressure chronic ulcer of other part of left foot with fat layer exposed: Secondary | ICD-10-CM

## 2019-01-20 DIAGNOSIS — M79675 Pain in left toe(s): Secondary | ICD-10-CM | POA: Diagnosis not present

## 2019-01-20 DIAGNOSIS — L02612 Cutaneous abscess of left foot: Secondary | ICD-10-CM | POA: Diagnosis not present

## 2019-01-20 DIAGNOSIS — M05771 Rheumatoid arthritis with rheumatoid factor of right ankle and foot without organ or systems involvement: Secondary | ICD-10-CM | POA: Diagnosis not present

## 2019-01-20 DIAGNOSIS — B351 Tinea unguium: Secondary | ICD-10-CM

## 2019-01-20 DIAGNOSIS — L03032 Cellulitis of left toe: Secondary | ICD-10-CM | POA: Diagnosis not present

## 2019-01-20 DIAGNOSIS — L89611 Pressure ulcer of right heel, stage 1: Secondary | ICD-10-CM | POA: Diagnosis not present

## 2019-01-20 DIAGNOSIS — M8610 Other acute osteomyelitis, unspecified site: Secondary | ICD-10-CM | POA: Diagnosis not present

## 2019-01-20 NOTE — Progress Notes (Signed)
Subjective: Patient presents today with history of rheumatoid arthritis with ccof painful, discolored, thick toenails which interfere with daily activities. Pain is aggravated when wearing enclosed shoe gear.   She is also here for follow up of chronic ulcer with history of cellulitis of the left 3rd digit.  Her caregiver, Stacey [name], is also present during the visit. Stacey [name] states the toe looks much better and they have been dressing it daily with Iodosorb Gel. They both feel the toe is managed well with the Iodosorb Gel.   New complaint today of heel pain posterior right heel. Patient sleeps on her back in bed and props it up on 2 pillows, but caregiver, Stacey [name] feels it isn't enough. The heel is tender to touch. They deny any drainage, warmth or odor from the area.  Caregiver, Stacey [name] relates Stacey [name] does spend a significant amount of time in her bed. She does try to encourage her to get out of the bed when she works her shift.  Stacey [name] denies any fever, chills, nightsweats, nausea or vomiting.  Stacey Saint, MD is her PCP. Last visit was 07/30/2018.   Current Outpatient Medications:  .  atorvastatin (LIPITOR) 10 MG tablet, TAKE 1 TABLET DAILY, Disp: 90 tablet, Rfl: 1 .  cyanocobalamin 1000 MCG tablet, Take 1,000 mcg by mouth daily., Disp: , Rfl:  .  desloratadine (CLARINEX) 5 MG tablet, Take 5 mg by mouth daily., Disp: , Rfl:  .  gabapentin (NEURONTIN) 400 MG capsule, Take 1 capsule (400 mg total) by mouth 3 (three) times daily., Disp: 270 capsule, Rfl: 1 .  meclizine (ANTIVERT) 12.5 MG tablet, Take 1 tablet (12.5 mg total) by mouth 3 (three) times daily as needed for dizziness., Disp: 30 tablet, Rfl: 0 .  metoprolol tartrate (LOPRESSOR) 100 MG tablet, Take 1 tablet (100 mg total) by mouth 2 (two) times daily., Disp: 180 tablet, Rfl: 3 .  MONUROL 3 g PACK, , Disp: , Rfl:  .  Multiple Vitamin (MULTIVITAMIN) capsule, Take 1 capsule by mouth daily., Disp: , Rfl:  .  omeprazole (PRILOSEC) 20 MG  capsule, Take 20 mg by mouth 2 (two) times daily before a meal., Disp: , Rfl:  .  potassium chloride SA (K-DUR,KLOR-CON) 20 MEQ tablet, Take 1 tablet (20 mEq total) by mouth daily., Disp: 90 tablet, Rfl: 3 .  prednisoLONE 5 MG TABS tablet, Take 1 tablet (5 mg total) by mouth daily., Disp: 90 tablet, Rfl: 1 .  sertraline (ZOLOFT) 100 MG tablet, TAKE 1 TABLET DAILY, Disp: 90 tablet, Rfl: 1 .  spironolactone (ALDACTONE) 25 MG tablet, TAKE ONE-HALF (1/2) TABLET DAILY, Disp: 90 tablet, Rfl: 3 .  torsemide (DEMADEX) 20 MG tablet, TAKE 2 TABLETS DAILY, Disp: 180 tablet, Rfl: 2 .  traMADol (ULTRAM) 50 MG tablet, Take 2 tablets (100 mg total) by mouth 3 (three) times daily as needed for moderate pain or severe pain., Disp: 120 tablet, Rfl: 5 .  VITAMIN D, CHOLECALCIFEROL, PO, Take 1,000 Units by mouth daily. , Disp: , Rfl:  .  warfarin (COUMADIN) 5 MG tablet, Take 2.5 mg on Wednesday and Saturday.  5 mg all other days, Disp: 100 tablet, Rfl: 3   Allergies  Allergen Reactions  . Sulfa Antibiotics Anaphylaxis  . Codeine Nausea And Vomiting  . Latex Rash  . Neosporin Original [Bacitracin-Neomycin-Polymyxin] Rash  . Polysporin [Bacitracin-Polymyxin B] Rash     Objective: There were no vitals filed for this visit.  Vascular Examination: Capillary refill time less than 3 seconds  x  10 digits.  Dorsalis pedis pulses palpable b/l.  Posterior tibial pulses nonpalpable b/l.  Digital hair absent x 10 digits.  Skin temperature gradient WNL  B/l.  Dermatological Examination: Pedal skin thin, shiny and atrophic b/l.  Toenails 1-5 right and left great toe discolored, thick, dystrophic with subungual debris and pain with palpation to nailbeds due to thickness of nails.  Anonychia digits 2-5 left foot with evidence of permanent total nail avulsion. Nailbed(s) completely epithelialized and intact.  Ulceration located medial 3rd digit DIPJ.  Hard yellowish keratotic roof, which, when removed appeared to  be resemble an old thin layer of cartiage. No erythema, no edema, no purulence, no odor. There is a small full thickness laceration which bleeds. Unable to palpate any bone in this area.   She has an area of erythema noted on posterolateral aspect of right heel.  Consistent with pressure injury. No drainage, no flocculence. No coolness. No warmth.  Musculoskeletal: Muscle strength 5/5 to all LE muscle groups.  Rigid hammertoe deformity 2-5 b/l with crossover of 2nd digit left foot which causes pressure to left 3rd digit.  Neurological: Sensation intact with 10 gram monofilament.  Vibratory sensation intact.  Xrays left foot: She has erosion of multiple joints of her forefoot secondary to RA. No gas in tissues.   Assessment: 1. Painful onychomycosis toenails 1-5 right and left great toe. 2. Pressure injury right heel, noninfected 3. Ulceration left 3rd digit, noninfected 4. Rigid hammertoe deformity of toes due to RA  Plan: 1. Toenails 1-5 right and left great toe were gently debrided in length and girth without iatrogenic bleeding. 2. Removed suspected cartilage from left 3rd digit. Specimen sent to pathology for confirmation.  3. Cleansed left 3rd digit with wound cleanser. Dressed with Iodosorb Gel and light dressing. Dispensed toe cushions to keep left 2nd toe from pressing against left 3rd digit. I feel caregiver has good understanding of dressing instructions and she keeps a very good eye on the digit. Instructed to cleanse digit with saline and dry well. Apply Iodosorb Gel and light dressing. 4. At this point, she has a chronic wound of the left 3rd digit and our goal is to prevent infection. She did see Dr. March [name] and she does not want to entertain surgical intervention due to her age. We had a long discussion of expectations. Aggressive padding, local wound care, pressure precautions of her heels are necessary to prevent infection of her left 3rd toe and right heel. They both related  understanding. 5. A pair of heel pillows was dispensed to her on today and she is to wear these to prevent pressure on both heels. Pain in her right heel should resolve with pressure relief. They were educated on signs of infection.  If she exeriences any increased redness, blistering, drainage, pain or odor from her right heel, she is to come in for re-evaluation.  5. Xray was performed and reviewed of the left foot with patient/caregiver.  6. Patient/caregiver were instructed to call immediately if any signs or symptoms of infection arise.  7. Follow up 3 months.  8. Patient/POA to call should there be a concern in the interim.

## 2019-01-21 ENCOUNTER — Telehealth: Payer: Self-pay | Admitting: *Deleted

## 2019-01-21 NOTE — Telephone Encounter (Signed)
[facility] - Stacey [name] states they received fresh specimen not in formalin, is a biopsy needed or is a straight specimen of the left toe.

## 2019-01-21 NOTE — Telephone Encounter (Signed)
I called Stacey [name] - [facility] and told her the specimen came from the joint space of the 3rd toe as the doctor was cleaning the area, and Dr. Elisha Ponder wanted to make sure the specimen was cartilage. Stacey [name] states understanding.

## 2019-01-22 ENCOUNTER — Other Ambulatory Visit: Payer: Self-pay

## 2019-01-22 ENCOUNTER — Telehealth: Payer: Self-pay | Admitting: Family Medicine

## 2019-01-22 MED ORDER — GABAPENTIN 400 MG PO CAPS: 400.0000 mg | ORAL_CAPSULE | Freq: Three times a day (TID) | ORAL | 1 refills | Status: DC

## 2019-01-22 NOTE — Telephone Encounter (Signed)
Copied from [place] [id number]. Topic: Quick Communication - Rx Refill/Question >> Jan 22, 2019  9:44 AM Sheran Luz wrote: Medication: gabapentin (NEURONTIN) 400 MG capsule  Patient is requesting refill of this medication.   Preferred Pharmacy (with phone number or street name):[facility] [place], [place] - [address] [phone] (Phone) [phone] (Fax)

## 2019-01-22 NOTE — Telephone Encounter (Signed)
Rx sent to pt pharmacy for refill °

## 2019-01-26 ENCOUNTER — Ambulatory Visit (INDEPENDENT_AMBULATORY_CARE_PROVIDER_SITE_OTHER): Payer: Medicare Other | Admitting: General Practice

## 2019-01-26 ENCOUNTER — Telehealth: Payer: Self-pay | Admitting: Podiatry

## 2019-01-26 ENCOUNTER — Telehealth: Payer: Self-pay | Admitting: *Deleted

## 2019-01-26 DIAGNOSIS — Z7901 Long term (current) use of anticoagulants: Secondary | ICD-10-CM

## 2019-01-26 DIAGNOSIS — I482 Chronic atrial fibrillation, unspecified: Secondary | ICD-10-CM

## 2019-01-26 LAB — POCT INR: INR: 2.5 (ref 2.0–3.0)

## 2019-01-26 LAB — TISSUE SPECIMEN

## 2019-01-26 LAB — PATHOLOGY REPORT

## 2019-01-26 NOTE — Patient Instructions (Signed)
Pre visit review using our clinic review tool, if applicable. No additional management support is needed unless otherwise documented below in the visit note.  Continue to take 5 mg daily except 2.5 mg on Wednesdays and Saturdays.  Patient's daughter notified that the results must be faxed to Villa Herb, RN.  Thayer Headings (dtr) [phone].  Re-check in 1 to 2 weeks.

## 2019-01-26 NOTE — Telephone Encounter (Signed)
Phoned patient's daughter, Stacey [name] regarding results of pathology from left 3rd digit indicating osteomyelitis.  Made daughter aware clinically, toe looks better and she may have a low grade infection. Daughter is aware Stacey [name] has seen [name] for surgical consultation and declines any surgical intervention at this time. Caretaker, Stacey [name], recognizes signs of infection and continues daily Iodosorb dressing changes to left 3rd digit. We will schedule a follow up visit for her in the next 1-2 weeks for re-evaluation. Daughter was appreciative of call. Either she or caretaker Stacey [name] will accompany Stacey [name] on her next visit.

## 2019-01-26 NOTE — Telephone Encounter (Signed)
Lattie Haw - [facility] asked if had received abnormal biopsy results. I took copy of results to Dr. Elisha Ponder.

## 2019-01-27 ENCOUNTER — Telehealth: Payer: Self-pay | Admitting: Family Medicine

## 2019-01-27 NOTE — Telephone Encounter (Signed)
Pt LOV was 08/20/2018 and last refill was on 07/24/2018 for 90 tablets with 1 refill, please advise

## 2019-01-27 NOTE — Telephone Encounter (Signed)
Pt request refill prednisoLONE 5 MG TABS tablet  90 day  [place], [place] - [address] [phone] (Phone) [phone] (Fax)   ([facility], so [facility] to [facility] this medicine, thanks) Please notify pt if there is an issue

## 2019-01-28 NOTE — Telephone Encounter (Signed)
Ok to refill 

## 2019-01-29 ENCOUNTER — Other Ambulatory Visit: Payer: Self-pay | Admitting: Family Medicine

## 2019-01-29 ENCOUNTER — Other Ambulatory Visit: Payer: Self-pay

## 2019-01-29 MED ORDER — PREDNISOLONE 5 MG PO TABS: 5.0000 mg | ORAL_TABLET | Freq: Every day | ORAL | 1 refills | Status: DC

## 2019-01-29 NOTE — Telephone Encounter (Signed)
Rx sent 

## 2019-02-02 NOTE — Telephone Encounter (Signed)
[facility] called stating she needs a nurse or pcp to give a call regarding medication.  Joy states she has questions about medication, that the medication is noncompliant  Call back# [phone] REF# [phone]

## 2019-02-02 NOTE — Progress Notes (Signed)
Medical screening examination/treatment/procedure(s) were performed by non-physician practitioner and as supervising physician I was immediately available for consultation/collaboration. I agree with above. James John, MD   

## 2019-02-04 NOTE — Telephone Encounter (Signed)
Pt calling to check on the status of this.  States that she is getting worried this hasn't been refilled yet.

## 2019-02-05 ENCOUNTER — Other Ambulatory Visit: Payer: Self-pay | Admitting: Family Medicine

## 2019-02-05 MED ORDER — PREDNISONE 5 MG PO TABS: 5.0000 mg | ORAL_TABLET | Freq: Every day | ORAL | 3 refills | Status: DC

## 2019-02-05 NOTE — Telephone Encounter (Signed)
Rx for prednisone 5 mg [facility] as [facility] did not have prednisolone 5 mg.

## 2019-02-05 NOTE — Telephone Encounter (Signed)
[facility]oke with [facility] states that they do not have PrednisoLONE 5 mg in stock but they have the Prednisone. Pt is ok to switch to prednisone due to the cost and she has took it before. Ok to send the prednisone?

## 2019-02-11 ENCOUNTER — Other Ambulatory Visit: Payer: Self-pay

## 2019-02-11 ENCOUNTER — Telehealth: Payer: Self-pay | Admitting: Family Medicine

## 2019-02-11 MED ORDER — OMEPRAZOLE 20 MG PO CPDR: 20.0000 mg | DELAYED_RELEASE_CAPSULE | Freq: Two times a day (BID) | ORAL | 0 refills | Status: DC

## 2019-02-11 NOTE — Telephone Encounter (Signed)
Ok to refill omeprazole 

## 2019-02-11 NOTE — Telephone Encounter (Signed)
Pt LOV was 08/20/2018 and last refill was on 12/10 2019, Omeprazole Rx sent please advise

## 2019-02-11 NOTE — Telephone Encounter (Signed)
Rx sent 

## 2019-02-11 NOTE — Telephone Encounter (Signed)
Medication Refill - Medication: Tramadol and omeprazole  Has the patient contacted their pharmacy? No.Pt called stating she is currently out of the tramadol. Pt is requesting that a 2-3 wk supply of the medication be sent to the [facility] to hold her over until [facility]. Please advise.  (Agent: If no, request that the patient contact the pharmacy for the refill.) (Agent: If yes, when and what did the pharmacy advise?)  Preferred Pharmacy (with phone number or street name):  [place] ([place]), [place] - [address]  [address] [place] [address]  Phone: [phone] Fax: [phone]  Not a 24 hour pharmacy; exact hours not known.   [place], [place]  [place] [address]  Phone: [phone] Fax: [phone]  Not a 24 hour pharmacy; exact hours not known.      Agent: Please be advised that RX refills may take up to 3 business days. We ask that you follow-up with your pharmacy.

## 2019-02-12 ENCOUNTER — Other Ambulatory Visit: Payer: Self-pay | Admitting: Family Medicine

## 2019-02-15 NOTE — Telephone Encounter (Signed)
Pt LOV was 08/20/2018 and last refill was 10/06/2018 for 120 tablets with 5 refills, please advise

## 2019-02-15 NOTE — Telephone Encounter (Signed)
Pt called stating she still needs a small amount of this medication sent to cover her until the medication can be received [facility]. Please advise as pt states she is completely out of medication.   [place] ([place]), [place] - [address]  [address] [place] ([place] [address]  Phone: [phone] Fax: [phone]  Not a 24 hour pharmacy; exact hours not known.

## 2019-02-16 NOTE — Telephone Encounter (Signed)
Pt calling to check on the status of the Tramadol.  Pt would like to speak with someone about this since it hasn't been called in.

## 2019-02-16 NOTE — Telephone Encounter (Signed)
Spoke with patient daughter. Informed her that Tramadol was sent in yesterday per Dr. Volanda Napoleon

## 2019-02-16 NOTE — Telephone Encounter (Signed)
Tramadol was sent in yesterday.

## 2019-02-18 DIAGNOSIS — I4821 Permanent atrial fibrillation: Secondary | ICD-10-CM | POA: Diagnosis not present

## 2019-02-18 DIAGNOSIS — Z7901 Long term (current) use of anticoagulants: Secondary | ICD-10-CM | POA: Diagnosis not present

## 2019-02-19 ENCOUNTER — Other Ambulatory Visit: Payer: Self-pay | Admitting: Family Medicine

## 2019-02-19 ENCOUNTER — Ambulatory Visit (INDEPENDENT_AMBULATORY_CARE_PROVIDER_SITE_OTHER): Payer: Medicare Other | Admitting: General Practice

## 2019-02-19 ENCOUNTER — Telehealth: Payer: Self-pay | Admitting: Family Medicine

## 2019-02-19 DIAGNOSIS — Z7901 Long term (current) use of anticoagulants: Secondary | ICD-10-CM | POA: Diagnosis not present

## 2019-02-19 DIAGNOSIS — I482 Chronic atrial fibrillation, unspecified: Secondary | ICD-10-CM

## 2019-02-19 LAB — POCT INR: INR: 3.7 — AB (ref 2.0–3.0)

## 2019-02-19 MED ORDER — TRAMADOL HCL 50 MG PO TABS: 100.0000 mg | ORAL_TABLET | Freq: Three times a day (TID) | ORAL | 5 refills | Status: DC | PRN

## 2019-02-19 MED ORDER — TRAMADOL HCL 50 MG PO TABS: 100.0000 mg | ORAL_TABLET | Freq: Three times a day (TID) | ORAL | 0 refills | Status: AC | PRN

## 2019-02-19 NOTE — Telephone Encounter (Signed)
Spoke with [facility] they report patient did pick up rx on 7/6.

## 2019-02-19 NOTE — Telephone Encounter (Signed)
Can someone please contact [facility] as this medication was sent in on 02/15/2019 until pt could get it from her mail order pharmacy.

## 2019-02-19 NOTE — Telephone Encounter (Signed)
[facility] to [facility] for this month.  A new rx was sent to pt's mail order pharmacy.

## 2019-02-19 NOTE — Telephone Encounter (Signed)
Medication Refill - Medication: traMADol (ULTRAM) 50 MG tablet  Pt states that she will be out of this medication before [facility] can get it to her, please send to [facility] so that pt can get this rx sooner.  Pt states she will be out of this medication tomorrow.   Preferred Pharmacy:   [facility] [place]), [place] - [address]  [address] [place] ([place]) [place] [address]  Phone: [phone] Fax: [phone]     Pt was advised that RX refills may take up to 3 business days. We ask that you follow-up with your pharmacy.

## 2019-02-19 NOTE — Patient Instructions (Signed)
Pre visit review using our clinic review tool, if applicable. No additional management support is needed unless otherwise documented below in the visit note.  Skip coumadin today (7/10) and then continue to take 5 mg daily except 2.5 mg on Wednesdays and Saturdays.  Dosing instructions given to daughter,  Thayer Headings [phone].  Re-check in 1 to 2 weeks.  Thayer Headings verbalized understanding.

## 2019-03-07 ENCOUNTER — Other Ambulatory Visit: Payer: Self-pay | Admitting: Family Medicine

## 2019-03-07 DIAGNOSIS — F329 Major depressive disorder, single episode, unspecified: Secondary | ICD-10-CM

## 2019-03-07 DIAGNOSIS — F32A Depression, unspecified: Secondary | ICD-10-CM

## 2019-03-07 NOTE — Telephone Encounter (Signed)
Dr. Banks please advise  

## 2019-03-09 ENCOUNTER — Ambulatory Visit (INDEPENDENT_AMBULATORY_CARE_PROVIDER_SITE_OTHER): Payer: Medicare Other | Admitting: General Practice

## 2019-03-09 DIAGNOSIS — Z7901 Long term (current) use of anticoagulants: Secondary | ICD-10-CM

## 2019-03-09 DIAGNOSIS — I482 Chronic atrial fibrillation, unspecified: Secondary | ICD-10-CM

## 2019-03-09 LAB — POCT INR: INR: 2.5 (ref 2.0–3.0)

## 2019-03-09 NOTE — Patient Instructions (Signed)
Pre visit review using our clinic review tool, if applicable. No additional management support is needed unless otherwise documented below in the visit note.  Continue to take 5 mg daily except 2.5 mg on Wednesdays and Saturdays.  Dosing instructions given to daughter,  Janice [phone].  Re-check in 1 to 2 weeks.  Janice verbalized understanding.    

## 2019-03-12 NOTE — Progress Notes (Signed)
Medical screening examination/treatment/procedure(s) were performed by non-physician practitioner and as supervising physician I was immediately available for consultation/collaboration. I agree with above. James John, MD   

## 2019-03-16 ENCOUNTER — Other Ambulatory Visit: Payer: Self-pay | Admitting: Family Medicine

## 2019-03-23 ENCOUNTER — Ambulatory Visit (INDEPENDENT_AMBULATORY_CARE_PROVIDER_SITE_OTHER): Payer: Medicare Other | Admitting: General Practice

## 2019-03-23 DIAGNOSIS — Z7901 Long term (current) use of anticoagulants: Secondary | ICD-10-CM | POA: Diagnosis not present

## 2019-03-23 DIAGNOSIS — I482 Chronic atrial fibrillation, unspecified: Secondary | ICD-10-CM

## 2019-03-23 LAB — POCT INR: INR: 3.1 — AB (ref 2.0–3.0)

## 2019-03-23 NOTE — Patient Instructions (Signed)
Pre visit review using our clinic review tool, if applicable. No additional management support is needed unless otherwise documented below in the visit note.  Take 1/2 tablet today and then continue to take 5 mg daily except 2.5 mg on Wednesdays and Saturdays.  Dosing instructions given to daughter,  Thayer Headings [phone].  Re-check in 1 to 2 weeks.  Thayer Headings verbalized understanding.

## 2019-03-27 ENCOUNTER — Other Ambulatory Visit: Payer: Self-pay | Admitting: Family Medicine

## 2019-04-03 LAB — POCT INR: INR: 2.1 (ref 2.0–3.0)

## 2019-04-09 ENCOUNTER — Other Ambulatory Visit: Payer: Self-pay

## 2019-04-09 ENCOUNTER — Ambulatory Visit (INDEPENDENT_AMBULATORY_CARE_PROVIDER_SITE_OTHER): Payer: Medicare Other | Admitting: General Practice

## 2019-04-09 ENCOUNTER — Telehealth (INDEPENDENT_AMBULATORY_CARE_PROVIDER_SITE_OTHER): Payer: Medicare Other | Admitting: Family Medicine

## 2019-04-09 DIAGNOSIS — M150 Primary generalized (osteo)arthritis: Secondary | ICD-10-CM

## 2019-04-09 DIAGNOSIS — E782 Mixed hyperlipidemia: Secondary | ICD-10-CM

## 2019-04-09 DIAGNOSIS — M8949 Other hypertrophic osteoarthropathy, multiple sites: Secondary | ICD-10-CM

## 2019-04-09 DIAGNOSIS — I482 Chronic atrial fibrillation, unspecified: Secondary | ICD-10-CM

## 2019-04-09 DIAGNOSIS — M159 Polyosteoarthritis, unspecified: Secondary | ICD-10-CM

## 2019-04-09 DIAGNOSIS — Z7901 Long term (current) use of anticoagulants: Secondary | ICD-10-CM | POA: Diagnosis not present

## 2019-04-09 MED ORDER — ATORVASTATIN CALCIUM 10 MG PO TABS: 10.0000 mg | ORAL_TABLET | Freq: Every day | ORAL | 3 refills | Status: DC

## 2019-04-09 NOTE — Patient Instructions (Signed)
Pre visit review using our clinic review tool, if applicable. No additional management support is needed unless otherwise documented below in the visit note.  Continue to take 5 mg daily except 2.5 mg on Wednesdays and Saturdays.  Dosing instructions given to daughter,  Janice [phone].  Re-check in 1 to 2 weeks.  Janice verbalized understanding.    

## 2019-04-09 NOTE — Progress Notes (Signed)
Virtual Visit via Telephone Note  I connected with Stacey [name] on 04/09/19 at 10:00 AM EDT by telephone and verified that I am speaking with the correct person using two identifiers 2/2 COVID-19 pandemic.   I discussed the limitations, risks, security and privacy concerns of performing an evaluation and management service by telephone and the availability of in person appointments. I also discussed with the patient that there may be a patient responsible charge related to this service. The patient expressed understanding and agreed to proceed.  Location patient: home Location provider: work or home office Participants present for the call: patient, provider, Pt's daughter Thayer Headings, and [place]- caregiver. Patient did not have a visit in the prior 7 days to address this/these issue(s).   History of Present Illness: Pt states she is doing well.  Has been staying in the house.  Pt needs a refill on atorvastatin.  On long term anticoagulation.  INR stable.  On tramadol for arthritis.  Has some pain in feet and hands.   Observations/Objective: Patient sounds cheerful and well on the phone. I do not appreciate any SOB. Speech and thought processing are grossly intact. Patient reported vitals:  Assessment and Plan: Long term (current) use of anticoagulants [Z79.01] -2/2 chronic afib -continue coumadin 2.5 mg q Wed, Sat.  5 mg all other days.  Wkly total 30 mg.  Primary osteoarthritis involving multiple joints -stable -continue tramadol and prednisone 5 mg daily (stated by previous provider) -also consider massage and heat  Mixed hyperlipidemia  - Plan: atorvastatin (LIPITOR) 10 MG tablet  Will see in clinic when able.    Follow Up Instructions: F/u prn   [id number] 5-10 [id number] 11-20 [id number] 21-30 I did not refer this patient for an OV in the next 24 hours for this/these issue(s).  I discussed the assessment and treatment plan with the patient. The patient was provided an opportunity  to ask questions and all were answered. The patient agreed with the plan and demonstrated an understanding of the instructions.   The patient was advised to call back or seek an in-person evaluation if the symptoms worsen or if the condition fails to improve as anticipated.  I provided 14:25 minutes of non-face-to-face time during this encounter.   Billie Ruddy, MD

## 2019-04-19 DIAGNOSIS — Z7901 Long term (current) use of anticoagulants: Secondary | ICD-10-CM | POA: Diagnosis not present

## 2019-04-19 DIAGNOSIS — I4821 Permanent atrial fibrillation: Secondary | ICD-10-CM | POA: Diagnosis not present

## 2019-04-23 ENCOUNTER — Ambulatory Visit (INDEPENDENT_AMBULATORY_CARE_PROVIDER_SITE_OTHER): Payer: Medicare Other | Admitting: General Practice

## 2019-04-23 DIAGNOSIS — I482 Chronic atrial fibrillation, unspecified: Secondary | ICD-10-CM | POA: Diagnosis not present

## 2019-04-23 DIAGNOSIS — Z7901 Long term (current) use of anticoagulants: Secondary | ICD-10-CM | POA: Diagnosis not present

## 2019-04-23 LAB — POCT INR: INR: 1.8 — AB (ref 2.0–3.0)

## 2019-04-23 NOTE — Patient Instructions (Addendum)
Pre visit review using our clinic review tool, if applicable. No additional management support is needed unless otherwise documented below in the visit note.  Take 1 1/2 tablets today and then continue to take 5 mg daily except 2.5 mg on Wednesdays and Saturdays.  Dosing instructions given to daughter,  Thayer Headings [phone].  Re-check in 1 to 2 weeks.  Thayer Headings verbalized understanding.

## 2019-04-28 ENCOUNTER — Ambulatory Visit (INDEPENDENT_AMBULATORY_CARE_PROVIDER_SITE_OTHER): Payer: Medicare Other | Admitting: Podiatry

## 2019-04-28 ENCOUNTER — Encounter: Payer: Self-pay | Admitting: Podiatry

## 2019-04-28 ENCOUNTER — Other Ambulatory Visit: Payer: Self-pay

## 2019-04-28 DIAGNOSIS — M79675 Pain in left toe(s): Secondary | ICD-10-CM

## 2019-04-28 DIAGNOSIS — M05771 Rheumatoid arthritis with rheumatoid factor of right ankle and foot without organ or systems involvement: Secondary | ICD-10-CM

## 2019-04-28 DIAGNOSIS — B351 Tinea unguium: Secondary | ICD-10-CM | POA: Diagnosis not present

## 2019-04-28 DIAGNOSIS — Z9229 Personal history of other drug therapy: Secondary | ICD-10-CM

## 2019-04-28 DIAGNOSIS — M79674 Pain in right toe(s): Secondary | ICD-10-CM

## 2019-04-28 DIAGNOSIS — M05772 Rheumatoid arthritis with rheumatoid factor of left ankle and foot without organ or systems involvement: Secondary | ICD-10-CM

## 2019-04-28 NOTE — Patient Instructions (Signed)

## 2019-05-03 ENCOUNTER — Other Ambulatory Visit: Payer: Self-pay | Admitting: *Deleted

## 2019-05-03 DIAGNOSIS — Z20822 Contact with and (suspected) exposure to covid-19: Secondary | ICD-10-CM

## 2019-05-03 DIAGNOSIS — R6889 Other general symptoms and signs: Secondary | ICD-10-CM | POA: Diagnosis not present

## 2019-05-04 LAB — NOVEL CORONAVIRUS, NAA: SARS-CoV-2, NAA: NOT DETECTED

## 2019-05-04 NOTE — Progress Notes (Signed)
Subjective: Stacey [name] is seen today with h/o RA,  Neuropathy and multiple digital deformities b/l for follow up painful, elongated, thickened toenails of both feet that she cannot cut. Pain interferes with daily activities. Aggravating factor includes wearing enclosed shoe gear and relieved with periodic debridement.  Her caregiver, Hollie Salk, is also present during the visit. Myra states the ulceration of the left 3rd digit finally healed with Iodosorb Gel.   [name] states she still uses her heel protectors most of the time and her right heel is not painful as it was initially.   [name] denies any new pedal complaints on today's visit. She remains on Coumadin.   Current Outpatient Medications on File Prior to Visit  Medication Sig  . atorvastatin (LIPITOR) 10 MG tablet Take 1 tablet (10 mg total) by mouth daily.  . cyanocobalamin 1000 MCG tablet Take 1,000 mcg by mouth daily.  Marland Kitchen desloratadine (CLARINEX) 5 MG tablet Take 5 mg by mouth daily.  Marland Kitchen gabapentin (NEURONTIN) 400 MG capsule Take 1 capsule (400 mg total) by mouth 3 (three) times daily.  . meclizine (ANTIVERT) 12.5 MG tablet Take 1 tablet (12.5 mg total) by mouth 3 (three) times daily as needed for dizziness.  . metoprolol tartrate (LOPRESSOR) 100 MG tablet TAKE 1 TABLET TWICE A DAY  . MONUROL 3 g PACK   . Multiple Vitamin (MULTIVITAMIN) capsule Take 1 capsule by mouth daily.  Marland Kitchen omeprazole (PRILOSEC) 20 MG capsule TAKE 1 CAPSULE TWICE A DAY BEFORE MEALS  . potassium chloride SA (K-DUR,KLOR-CON) 20 MEQ tablet Take 1 tablet (20 mEq total) by mouth daily.  . predniSONE (DELTASONE) 5 MG tablet Take 1 tablet (5 mg total) by mouth daily with breakfast.  . sertraline (ZOLOFT) 100 MG tablet TAKE 1 TABLET DAILY  . spironolactone (ALDACTONE) 25 MG tablet TAKE ONE-HALF (1/2) TABLET DAILY  . torsemide (DEMADEX) 20 MG tablet TAKE 2 TABLETS DAILY  . traMADol (ULTRAM) 50 MG tablet Take 2 tablets (100 mg total) by mouth 3 (three) times  daily as needed.  Marland Kitchen VITAMIN D, CHOLECALCIFEROL, PO Take 1,000 Units by mouth daily.   Marland Kitchen warfarin (COUMADIN) 5 MG tablet Take 2.5 mg on Wednesday and Saturday.  5 mg all other days   No current facility-administered medications on file prior to visit.      Allergies  Allergen Reactions  . Sulfa Antibiotics Anaphylaxis  . Codeine Nausea And Vomiting  . Latex Rash  . Neosporin Original [Bacitracin-Neomycin-Polymyxin] Rash  . Polysporin [Bacitracin-Polymyxin B] Rash   Objective: 85 y.o. Caucasian female, elderly, in NAD. AAO x 3. Pleasant, conversant and cooperative.  Vascular Examination: Capillary refill time <3 seconds x 10 digits.  Dorsalis pedis 1/4 b/l.  Posterior tibial pulses 0/4 b/l.  Digital hair absent x 10 digits.  Skin temperature gradient WNL b/l.   Dermatological Examination: Skin with normal turgor, texture and tone b/l.  Anonychia digits 2-5 left foot with evidence of permanent total nail avulsion. Nailbeds completely epithelialized and intact. No edema, no erythema, no drainage, no flocculence.  Toenails 1-5 right and left great toe discolored, thick, dystrophic with subungual debris and pain with palpation to nailbeds due to thickness of nails.  No open wounds noted b/l. Left 3rd digit completely epithelialized.  Musculoskeletal: Muscle strength 5/5 to all LE muscle groups.  Rigid hammertoe deformity 2-5 b/l with overlapping 2nd digit left foot.  Neurological Examination: Protective sensation intact 5/5 b/l with 10 gram monofilament bilaterally.  Epicritic sensation present bilaterally.  Vibratory sensation intact  bilaterally.   Assessment: Painful onychomycosis toenails 1-5 right and left great toe Rigid hammertoe deformity 2-5 b/l  Rheumatoid Arthritis Patient on long term blood thinner  Plan: 1. Toenails 1-5 right and left great toe were debrided in length and girth without iatrogenic bleeding. 2. Patient to continue soft, supportive shoe  gear.  3. Patient to report any pedal injuries to medical professional immediately. 4. Follow up 3 months.  5. Patient/POA to call should there be a concern in the interim.

## 2019-05-11 ENCOUNTER — Other Ambulatory Visit: Payer: Self-pay | Admitting: Family Medicine

## 2019-05-13 ENCOUNTER — Emergency Department (HOSPITAL_COMMUNITY): Payer: Medicare Other

## 2019-05-13 ENCOUNTER — Other Ambulatory Visit: Payer: Self-pay

## 2019-05-13 ENCOUNTER — Encounter (HOSPITAL_COMMUNITY): Payer: Self-pay | Admitting: Emergency Medicine

## 2019-05-13 ENCOUNTER — Emergency Department (HOSPITAL_COMMUNITY)
Admission: EM | Admit: 2019-05-13 | Discharge: 2019-05-13 | Disposition: A | Discharge status: Home / Self Care | Payer: Medicare Other | Attending: Emergency Medicine | Admitting: Emergency Medicine

## 2019-05-13 DIAGNOSIS — Z20828 Contact with and (suspected) exposure to other viral communicable diseases: Secondary | ICD-10-CM | POA: Diagnosis not present

## 2019-05-13 DIAGNOSIS — I4891 Unspecified atrial fibrillation: Secondary | ICD-10-CM | POA: Diagnosis not present

## 2019-05-13 DIAGNOSIS — I639 Cerebral infarction, unspecified: Secondary | ICD-10-CM | POA: Diagnosis not present

## 2019-05-13 DIAGNOSIS — Z91040 Latex allergy status: Secondary | ICD-10-CM | POA: Insufficient documentation

## 2019-05-13 DIAGNOSIS — Z87891 Personal history of nicotine dependence: Secondary | ICD-10-CM | POA: Diagnosis not present

## 2019-05-13 DIAGNOSIS — R05 Cough: Secondary | ICD-10-CM | POA: Diagnosis not present

## 2019-05-13 DIAGNOSIS — I959 Hypotension, unspecified: Secondary | ICD-10-CM | POA: Diagnosis not present

## 2019-05-13 DIAGNOSIS — R110 Nausea: Secondary | ICD-10-CM | POA: Diagnosis not present

## 2019-05-13 DIAGNOSIS — Z79899 Other long term (current) drug therapy: Secondary | ICD-10-CM | POA: Insufficient documentation

## 2019-05-13 DIAGNOSIS — I213 ST elevation (STEMI) myocardial infarction of unspecified site: Secondary | ICD-10-CM | POA: Diagnosis not present

## 2019-05-13 DIAGNOSIS — R42 Dizziness and giddiness: Secondary | ICD-10-CM | POA: Diagnosis not present

## 2019-05-13 DIAGNOSIS — Z7901 Long term (current) use of anticoagulants: Secondary | ICD-10-CM | POA: Insufficient documentation

## 2019-05-13 DIAGNOSIS — R000 Tachycardia, unspecified: Secondary | ICD-10-CM | POA: Diagnosis not present

## 2019-05-13 LAB — I-STAT CHEM 8, ED
BUN: 20 mg/dL (ref 8–23)
Calcium, Ion: 1.09 mmol/L — ABNORMAL LOW (ref 1.15–1.40)
Chloride: 97 mmol/L — ABNORMAL LOW (ref 98–111)
Creatinine, Ser: 0.8 mg/dL (ref 0.44–1.00)
Glucose, Bld: 118 mg/dL — ABNORMAL HIGH (ref 70–99)
HCT: 39 % (ref 36.0–46.0)
Hemoglobin: 13.3 g/dL (ref 12.0–15.0)
Potassium: 3.4 mmol/L — ABNORMAL LOW (ref 3.5–5.1)
Sodium: 136 mmol/L (ref 135–145)
TCO2: 29 mmol/L (ref 22–32)

## 2019-05-13 LAB — DIFFERENTIAL
Abs Immature Granulocytes: 0.03 10*3/uL (ref 0.00–0.07)
Basophils Absolute: 0.1 10*3/uL (ref 0.0–0.1)
Basophils Relative: 1 %
Eosinophils Absolute: 0.1 10*3/uL (ref 0.0–0.5)
Eosinophils Relative: 2 %
Immature Granulocytes: 0 %
Lymphocytes Relative: 8 %
Lymphs Abs: 0.6 10*3/uL — ABNORMAL LOW (ref 0.7–4.0)
Monocytes Absolute: 0.6 10*3/uL (ref 0.1–1.0)
Monocytes Relative: 9 %
Neutro Abs: 5.6 10*3/uL (ref 1.7–7.7)
Neutrophils Relative %: 80 %

## 2019-05-13 LAB — RAPID URINE DRUG SCREEN, HOSP PERFORMED
Amphetamines: NOT DETECTED
Barbiturates: NOT DETECTED
Benzodiazepines: NOT DETECTED
Cocaine: NOT DETECTED
Opiates: NOT DETECTED
Tetrahydrocannabinol: NOT DETECTED

## 2019-05-13 LAB — PROTIME-INR
INR: 2.1 — ABNORMAL HIGH (ref 0.8–1.2)
Prothrombin Time: 23.2 seconds — ABNORMAL HIGH (ref 11.4–15.2)

## 2019-05-13 LAB — URINALYSIS, ROUTINE W REFLEX MICROSCOPIC
Bilirubin Urine: NEGATIVE
Glucose, UA: NEGATIVE mg/dL
Hgb urine dipstick: NEGATIVE
Ketones, ur: NEGATIVE mg/dL
Leukocytes,Ua: NEGATIVE
Nitrite: NEGATIVE
Protein, ur: NEGATIVE mg/dL
Specific Gravity, Urine: 1.006 (ref 1.005–1.030)
pH: 7 (ref 5.0–8.0)

## 2019-05-13 LAB — COMPREHENSIVE METABOLIC PANEL
ALT: 21 U/L (ref 0–44)
AST: 31 U/L (ref 15–41)
Albumin: 3.7 g/dL (ref 3.5–5.0)
Alkaline Phosphatase: 77 U/L (ref 38–126)
Anion gap: 10 (ref 5–15)
BUN: 18 mg/dL (ref 8–23)
CO2: 28 mmol/L (ref 22–32)
Calcium: 8.8 mg/dL — ABNORMAL LOW (ref 8.9–10.3)
Chloride: 97 mmol/L — ABNORMAL LOW (ref 98–111)
Creatinine, Ser: 0.86 mg/dL (ref 0.44–1.00)
GFR calc Af Amer: >60 mL/min (ref >60)
GFR calc non Af Amer: >60 mL/min (ref >60)
Glucose, Bld: 121 mg/dL — ABNORMAL HIGH (ref 70–99)
Potassium: 3.6 mmol/L (ref 3.5–5.1)
Sodium: 135 mmol/L (ref 135–145)
Total Bilirubin: 0.7 mg/dL (ref 0.3–1.2)
Total Protein: 7 g/dL (ref 6.5–8.1)

## 2019-05-13 LAB — CBC
HCT: 39.1 % (ref 36.0–46.0)
Hemoglobin: 12.7 g/dL (ref 12.0–15.0)
MCH: 30.2 pg (ref 26.0–34.0)
MCHC: 32.5 g/dL (ref 30.0–36.0)
MCV: 93.1 fL (ref 80.0–100.0)
Platelets: 227 10*3/uL (ref 150–400)
RBC: 4.2 MIL/uL (ref 3.87–5.11)
RDW: 13.5 % (ref 11.5–15.5)
WBC: 7.1 10*3/uL (ref 4.0–10.5)
nRBC: 0 % (ref 0.0–0.2)

## 2019-05-13 LAB — ETHANOL: Alcohol, Ethyl (B): <10 mg/dL (ref <10)

## 2019-05-13 LAB — APTT: aPTT: 46 seconds — ABNORMAL HIGH (ref 24–36)

## 2019-05-13 MED ORDER — IOHEXOL 350 MG/ML SOLN
100.0000 mL | Freq: Once | INTRAVENOUS | Status: AC | PRN
  Administered 2019-05-13: 100 mL via INTRAVENOUS

## 2019-05-13 NOTE — ED Provider Notes (Signed)
[facility] EMERGENCY DEPARTMENT Provider Note   CSN: [id number] Arrival date & time: 05/13/19  1616     History   Chief Complaint Chief Complaint  Patient presents with   Nausea    HPI Stacey [name] is a 85 y.o. female.     HPI  Woke up with slight headache Around 3PM, said room was spinning Grabbed her hand, sat down, was spinning and spinning, nausea and dry heaves 20 minutes after started seemed to calm down Clammy, dizzy Seems groggy and fatigued Woke up with slight left sided headache, doesn't usually have headaches 2-3 weeks of involuntary hand movements, both hands  This AM was saying she was fatigued, having difficulties for 3 weeks off and on Sleepiness Cough off and on for 6-8 weeks, more developed Daughter and her husband tested positive for COVID19 earlier this month, daughter still positive 9/19 but asymptomatic, patient tested negative 9/21  Has history of some vertigo and some related to cerumen impaction, takes meclizine but has never had an episode this severe.  Denies numbness/weakness/visual changes/difficulty talking or walking/facial droop     Past Medical History:  Diagnosis Date   Atrial fibrillation (HCC)    Hernia of abdominal cavity    Kyphosis     Patient Active Problem List   Diagnosis Date Noted   Conductive hearing loss, bilateral 07/03/2018   Bilateral impacted cerumen 07/03/2018   Acute swimmer's ear of left side 07/03/2018   Long term (current) use of anticoagulants [Z79.01] 07/25/2017   Chronic atrial fibrillation (HCC) 07/11/2017   Primary osteoarthritis involving multiple joints 07/11/2017   Pseudogout 07/11/2017   Neuropathy 07/11/2017   Lymphedema 07/11/2017   Kyphosis of cervical region 07/11/2017   Congestive heart failure (HCC) 07/11/2017    Past Surgical History:  Procedure Laterality Date   ABDOMINAL HYSTERECTOMY     TONSILLECTOMY       OB History   No obstetric  history on file.      Home Medications    Prior to Admission medications   Medication Sig Start Date End Date Taking? Authorizing Provider  atorvastatin (LIPITOR) 10 MG tablet Take 1 tablet (10 mg total) by mouth daily. 04/09/19  Yes [name], MD  cyanocobalamin 1000 MCG tablet Take 1,000 mcg by mouth daily.   Yes [provider]  desloratadine (CLARINEX) 5 MG tablet Take 5 mg by mouth daily.   Yes [provider]  gabapentin (NEURONTIN) 400 MG capsule Take 1 capsule (400 mg total) by mouth 3 (three) times daily. 01/22/19  Yes [name], MD  meclizine (ANTIVERT) 12.5 MG tablet Take 1 tablet (12.5 mg total) by mouth 3 (three) times daily as needed for dizziness. 09/14/18  Yes [name], MD  metoprolol tartrate (LOPRESSOR) 100 MG tablet TAKE 1 TABLET TWICE A DAY 02/05/19  Yes [name], MD  Multiple Vitamin (MULTIVITAMIN) capsule Take 1 capsule by mouth daily.   Yes [provider]  omeprazole (PRILOSEC) 20 MG capsule TAKE 1 CAPSULE TWICE A DAY BEFORE MEALS Patient taking differently: Take 20 mg by mouth 2 (two) times daily before a meal.  03/16/19  Yes [name], MD  Polyethyl Glycol-Propyl Glycol (SYSTANE FREE OP) Place 1 drop into both eyes 3 (three) times daily.   Yes [provider]  potassium chloride SA (K-DUR,KLOR-CON) 20 MEQ tablet Take 1 tablet (20 mEq total) by mouth daily. 06/12/18 06/12/19 Yes Lyn Records, MD  predniSONE (DELTASONE) 5 MG tablet Take  1 tablet (5 mg total) by mouth daily with breakfast. 02/05/19  Yes Billie Ruddy, MD  sertraline (ZOLOFT) 100 MG tablet TAKE 1 TABLET DAILY 03/08/19  Yes Billie Ruddy, MD  spironolactone (ALDACTONE) 25 MG tablet TAKE ONE-HALF (1/2) TABLET DAILY 11/13/18  Yes Billie Ruddy, MD  torsemide (DEMADEX) 20 MG tablet TAKE 2 TABLETS DAILY 10/21/18  Yes Belva Crome, MD  traMADol (ULTRAM) 50 MG tablet Take 2 tablets (100 mg total) by mouth 3 (three) times daily as  needed. Patient taking differently: Take 100 mg by mouth 3 (three) times daily.  02/19/19  Yes Billie Ruddy, MD  VITAMIN D, CHOLECALCIFEROL, PO Take 1,000 Units by mouth daily.    Yes [provider]  warfarin (COUMADIN) 5 MG tablet Take 2.5 mg on Wednesday and Saturday.  5 mg all other days Patient taking differently: Take 2.5-5 mg by mouth See admin instructions. Take 2.5 mg on Wednesday and Saturday.  Then take 5 mg by mouth all other days 06/26/18  Yes Billie Ruddy, MD    Family History No family history on file.  Social History Social History   Tobacco Use   Smoking status: Former Smoker   Smokeless tobacco: Never Used  Substance Use Topics   Alcohol use: No    Frequency: Never   Drug use: No     Allergies   Sulfa antibiotics, Codeine, Latex, Neosporin original [bacitracin-neomycin-polymyxin], and Polysporin [bacitracin-polymyxin b]   Review of Systems Review of Systems  Constitutional: Negative for fever.  HENT: Negative for sore throat.   Eyes: Negative for visual disturbance.  Respiratory: Negative for cough and shortness of breath.   Cardiovascular: Negative for chest pain.  Gastrointestinal: Positive for nausea. Negative for abdominal pain and vomiting.  Genitourinary: Negative for difficulty urinating.  Musculoskeletal: Negative for back pain, gait problem and neck pain.  Skin: Negative for rash.  Neurological: Positive for dizziness. Negative for syncope, facial asymmetry, speech difficulty, weakness, numbness and headaches.     Physical Exam Updated Vital Signs BP 106/86    Pulse (!) 109    Temp 97.6 F (36.4 C) (Oral)    Resp (!) 22    Ht 5\' 3"  (1.6 m)    Wt 47.6 kg    SpO2 (!) 88%    BMI 18.60 kg/m   Physical Exam Vitals signs and nursing note reviewed.  Constitutional:      General: She is not in acute distress.    Appearance: She is well-developed. She is not diaphoretic.  HENT:     Head: Normocephalic and atraumatic.  Eyes:      General: No visual field deficit.    Conjunctiva/sclera: Conjunctivae normal.  Neck:     Musculoskeletal: Normal range of motion.  Cardiovascular:     Rate and Rhythm: Normal rate and regular rhythm.     Heart sounds: Normal heart sounds. No murmur. No friction rub. No gallop.   Pulmonary:     Effort: Pulmonary effort is normal. No respiratory distress.     Breath sounds: Normal breath sounds. No wheezing or rales.  Abdominal:     General: There is no distension.     Palpations: Abdomen is soft.     Tenderness: There is no abdominal tenderness. There is no guarding.  Musculoskeletal:        General: No tenderness.  Skin:    General: Skin is warm and dry.     Findings: No erythema or rash.  Neurological:  Mental Status: She is alert and oriented to person, place, and time.     GCS: GCS eye subscore is 4. GCS verbal subscore is 5. GCS motor subscore is 6.     Cranial Nerves: Cranial nerves are intact. No cranial nerve deficit, dysarthria or facial asymmetry.     Sensory: Sensation is intact. No sensory deficit.     Motor: Motor function is intact. No abnormal muscle tone or pronator drift.     Coordination: Coordination is intact. Romberg sign negative. Coordination normal. Finger-Nose-Finger Test and [facility]el to [facility] Test normal.     Gait: Gait is intact. Abnormal gait: severe kyphosis with associated gait but no ataxia, is at baseline.      ED Treatments / Results  Labs (all labs ordered are listed, but only abnormal results are displayed) Labs Reviewed  PROTIME-INR - Abnormal; Notable for the following components:      Result Value   Prothrombin Time 23.2 (*)    INR 2.1 (*)    All other components within normal limits  APTT - Abnormal; Notable for the following components:   aPTT 46 (*)    All other components within normal limits  DIFFERENTIAL - Abnormal; Notable for the following components:   Lymphs Abs 0.6 (*)    All other components within normal limits   COMPREHENSIVE METABOLIC PANEL - Abnormal; Notable for the following components:   Chloride 97 (*)    Glucose, Bld 121 (*)    Calcium 8.8 (*)    All other components within normal limits  URINALYSIS, ROUTINE W REFLEX MICROSCOPIC - Abnormal; Notable for the following components:   Color, Urine STRAW (*)    All other components within normal limits  I-STAT CHEM 8, ED - Abnormal; Notable for the following components:   Potassium 3.4 (*)    Chloride 97 (*)    Glucose, Bld 118 (*)    Calcium, Ion 1.09 (*)    All other components within normal limits  ETHANOL  CBC  RAPID URINE DRUG SCREEN, HOSP PERFORMED    EKG EKG Interpretation  Date/Time:  Thursday May 13 2019 16:21:43 EDT Ventricular Rate:  65 PR Interval:    QRS Duration: 106 QT Interval:  451 QTC Calculation: 469 R Axis:   112 Text Interpretation:  Atrial fibrillation Ventricular premature complex Right axis deviation No significant change since last tracing Confirmed by Alvira MondaySchlossman, Tlaloc Taddei ([id number]) on 05/13/2019 4:44:03 PM   Radiology Ct Angio Head W Or Wo Contrast  Result Date: 05/13/2019 CLINICAL DATA:  Stroke.  Headache and vertigo EXAM: CT ANGIOGRAPHY HEAD AND NECK TECHNIQUE: Multidetector CT imaging of the head and neck was performed using the standard protocol during bolus administration of intravenous contrast. Multiplanar CT image reconstructions and MIPs were obtained to evaluate the vascular anatomy. Carotid stenosis measurements (when applicable) are obtained utilizing NASCET criteria, using the distal internal carotid diameter as the denominator. CONTRAST:  100mL OMNIPAQUE IOHEXOL 350 MG/ML SOLN COMPARISON:  None. FINDINGS: CT HEAD FINDINGS Brain: Mild atrophy. Negative for acute infarct. Negative for acute hemorrhage or mass. Vascular: Negative for hyperdense vessel Skull: Negative Sinuses: Negative Orbits: Negative Review of the MIP images confirms the above findings CTA NECK FINDINGS Aortic arch: Atherosclerotic  aortic arch. 75% diameter stenosis of the proximal right subclavian artery. Associated sharp kink in the vessel at this level. 60% diameter stenosis proximal left subclavian artery. Right carotid system: Mild atherosclerotic calcification right carotid bifurcation without significant carotid stenosis. Left carotid system: Mild atherosclerotic calcification left carotid  bifurcation without significant stenosis. Vertebral arteries: Both vertebral arteries patent to the basilar without stenosis. Skeleton: Advanced degenerative change in pannus at C1-2. Erosion of the posterior dens. Findings may be due to CPPD. Other neck: Cystic nodule left thyroid measuring 15 x 18 mm. No adenopathy in the neck. Upper chest: Apically scarring bilaterally. No acute infiltrate or effusion. Review of the MIP images confirms the above findings CTA HEAD FINDINGS Anterior circulation: Cavernous carotid patent bilaterally. Anterior and middle cerebral arteries widely patent bilaterally without significant stenosis. Posterior circulation: Both vertebral arteries patent to the basilar. PICA patent bilaterally. Basilar widely patent. Superior cerebellar and posterior cerebral arteries patent bilaterally. Posterior communicating artery patent bilaterally. Venous sinuses: Normal venous enhancement Anatomic variants: None Review of the MIP images confirms the above findings IMPRESSION: 1. No acute intracranial abnormality 2. No significant carotid or vertebral artery stenosis in the neck. 3. Negative for emergent large vessel occlusion. No significant intracranial stenosis 4. 75% diameter stenosis proximal right subclavian artery. 60% diameter stenosis proximal left subclavian artery 5. 15 x 18 mm cystic nodule left thyroid 6. Extensive arthropathy at C1-2 with erosion of the dens. Possible CPPD. Electronically Signed   By: Marlan Palau M.D.   On: 05/13/2019 19:56   Ct Angio Neck W And/or Wo Contrast  Result Date: 05/13/2019 CLINICAL DATA:   Stroke.  Headache and vertigo EXAM: CT ANGIOGRAPHY HEAD AND NECK TECHNIQUE: Multidetector CT imaging of the head and neck was performed using the standard protocol during bolus administration of intravenous contrast. Multiplanar CT image reconstructions and MIPs were obtained to evaluate the vascular anatomy. Carotid stenosis measurements (when applicable) are obtained utilizing NASCET criteria, using the distal internal carotid diameter as the denominator. CONTRAST:  OMNIPAQUE IOHEXOL 350 MG/ML SOLN COMPARISON:  None. FINDINGS: CT HEAD FINDINGS Brain: Mild atrophy. Negative for acute infarct. Negative for acute hemorrhage or mass. Vascular: Negative for hyperdense vessel Skull: Negative Sinuses: Negative Orbits: Negative Review of the MIP images confirms the above findings CTA NECK FINDINGS Aortic arch: Atherosclerotic aortic arch. 75% diameter stenosis of the proximal right subclavian artery. Associated sharp kink in the vessel at this level. 60% diameter stenosis proximal left subclavian artery. Right carotid system: Mild atherosclerotic calcification right carotid bifurcation without significant carotid stenosis. Left carotid system: Mild atherosclerotic calcification left carotid bifurcation without significant stenosis. Vertebral arteries: Both vertebral arteries patent to the basilar without stenosis. Skeleton: Advanced degenerative change in pannus at C1-2. Erosion of the posterior dens. Findings may be due to CPPD. Other neck: Cystic nodule left thyroid measuring 15 x 18 mm. No adenopathy in the neck. Upper chest: Apically scarring bilaterally. No acute infiltrate or effusion. Review of the MIP images confirms the above findings CTA HEAD FINDINGS Anterior circulation: Cavernous carotid patent bilaterally. Anterior and middle cerebral arteries widely patent bilaterally without significant stenosis. Posterior circulation: Both vertebral arteries patent to the basilar. PICA patent bilaterally. Basilar  widely patent. Superior cerebellar and posterior cerebral arteries patent bilaterally. Posterior communicating artery patent bilaterally. Venous sinuses: Normal venous enhancement Anatomic variants: None Review of the MIP images confirms the above findings IMPRESSION: 1. No acute intracranial abnormality 2. No significant carotid or vertebral artery stenosis in the neck. 3. Negative for emergent large vessel occlusion. No significant intracranial stenosis 4. 75% diameter stenosis proximal right subclavian artery. 60% diameter stenosis proximal left subclavian artery 5. 15 x 18 mm cystic nodule left thyroid 6. Extensive arthropathy at C1-2 with erosion of the dens. Possible CPPD. Electronically Signed   By: Leonette Most  Chestine Spore M.D.   On: 05/13/2019 19:56   Mr Brain Wo Contrast  Result Date: 05/13/2019 CLINICAL DATA:  Initial evaluation for acute vertigo. EXAM: MRI HEAD WITHOUT CONTRAST TECHNIQUE: Multiplanar, multiecho pulse sequences of the brain and surrounding structures were obtained without intravenous contrast. COMPARISON:  Prior CTA from earlier the same day. FINDINGS: Brain: Generalized age-related cerebral atrophy. Patchy T2/FLAIR hyperintensity within the periventricular, deep, and subcortical white matter both cerebral hemispheres noted, nonspecific, but most like related to chronic small vessel ischemic disease, mild for age. No abnormal foci of restricted diffusion to suggest acute or subacute ischemia. Gray-white matter differentiation maintained. No encephalomalacia to suggest chronic cortical infarction. No foci of susceptibility artifact to suggest acute or chronic intracranial hemorrhage. No mass lesion, midline shift or mass effect. No hydrocephalus. No extra-axial fluid collection. Pituitary gland suprasellar region normal. Midline structures intact. Vascular: Major intracranial vascular flow voids are maintained. Skull and upper cervical spine: Prominent degenerative thickening at the tectorial  membrane with secondary mild narrowing at the craniocervical junction. Visualized upper cervical spine within normal limits. Bone marrow signal intensity normal. No scalp soft tissue abnormality. Sinuses/Orbits: Globes and orbital soft tissues within normal limits. Paranasal sinuses are largely clear. Small left mastoid effusion noted, of doubtful significance. Inner ear structures normal. Other: None. IMPRESSION: 1. No acute intracranial abnormality. 2. Generalized age-related cerebral atrophy with mild chronic small vessel ischemic disease. Electronically Signed   By: Rise Mu M.D.   On: 05/13/2019 23:00    Procedures Procedures (including critical care time)  Medications Ordered in ED Medications  iohexol (OMNIPAQUE) 350 MG/ML injection 100 mL (100 mLs Intravenous Contrast Given 05/13/19 1931)     Initial Impression / Assessment and Plan / ED Course  I have reviewed the triage vital signs and the nursing notes.  Pertinent labs & imaging results that were available during my care of the patient were reviewed by me and considered in my medical decision making (see chart for details).        85yo female with history of atrial fibrillation on coumadin, conductive hearling loss, history of vertigo, CHF, kyphosis, presents with concern for episode of vertigo.   Differential diagnosis for dizziness includes central causes such as stroke, intracranial bleed, mass and peripheral causes such as BPPV, meniere's disease, viral.  Vertigo was severe, positional without other associated neurologic symptoms, and she has a normal neurologic exam, including normal gait and coordination.  She did describe some posterior neck pain and obtained CTA head/neck which did not show signs of dissection or occlusion.  Given stroke risk factors, obtained MRI brain which did not show evidence of CVA.  Discussed with Neurology and patient and agree that given history of prior peripheral vertigo, absence of  neurologic symptoms, and historical elements consistent with peripheral vertigo, feel this is more likely than TIA and do not feel TIA admission is indicated at this time.  She has remained asymptomatic in the ED, able to ambulate to the bathroom with cane.  Recommend continued PCP follow up. Discussed incidental finding of subclavian stenosis.  While subclavian steal is possible etiology of her symptoms (with history of recurrent vertigo), she does not describe the symptoms beginning specifically with arm movements.  Discussed possible vascular follow up and sent message to PCP.  Patient discharged in stable condition with understanding of reasons to return.   Final Clinical Impressions(s) / ED Diagnoses   Final diagnoses:  Vertigo    ED Discharge Orders    None  Alvira MondaySchlossman, Naome Brigandi, MD 05/14/19 [phone]

## 2019-05-13 NOTE — ED Notes (Signed)
Patient verbalizes understanding of discharge instructions. Opportunity for questioning and answers were provided. Armband removed by staff, pt discharged from ED ambulatory.   

## 2019-05-13 NOTE — ED Triage Notes (Signed)
Pt arrives via EMS from home with reports of waking up with headache and vertigo that was worse than normal. Pt endorses dry heaves with relief by zofran. Headache has resolved. Denies dizziness.

## 2019-05-13 NOTE — ED Notes (Signed)
Urine culture sent with urine sample. 

## 2019-05-17 ENCOUNTER — Encounter: Payer: Self-pay | Admitting: Family Medicine

## 2019-05-17 DIAGNOSIS — I708 Atherosclerosis of other arteries: Secondary | ICD-10-CM | POA: Insufficient documentation

## 2019-05-17 DIAGNOSIS — I771 Stricture of artery: Secondary | ICD-10-CM | POA: Insufficient documentation

## 2019-05-20 ENCOUNTER — Other Ambulatory Visit: Payer: Self-pay

## 2019-05-20 ENCOUNTER — Ambulatory Visit: Payer: Self-pay | Admitting: *Deleted

## 2019-05-20 MED ORDER — GABAPENTIN 300 MG PO CAPS: 300.0000 mg | ORAL_CAPSULE | Freq: Three times a day (TID) | ORAL | 2 refills | Status: DC

## 2019-05-20 MED ORDER — TRAMADOL HCL 50 MG PO TABS: 100.0000 mg | ORAL_TABLET | Freq: Two times a day (BID) | ORAL | 5 refills | Status: DC | PRN

## 2019-05-20 NOTE — Telephone Encounter (Signed)
Pt's caregiver wants to know if pt can have Trazadone and Gabapentin together. States that pt has been very tired lately   Returned call to her caregiver. Patient is taking tramadol and gabapentin each 3 times a day. The care giver stated the patient had been off the gabapentin for 2 years and started back taking it in April. So now she takes them both together.  She has been disoriented, sleepy, moody,dizzy, nausea and c/o that she can not breathe.  She was seen in the Ed on Oct 1 for c/o shortness of breath and dizziness.  Caregiver wants to know if any of her meds should be held. She has an appointment with her provider on Nov 5 th. Contacting [facility] at [facility] for recommendation per protocol. Checking with Dr. Volanda Napoleon for recommendation. Dr. Volanda Napoleon is requesting the patient take the tramadol 100 mg twice a d day as needed and the gabapentin 300 mg three times a day. She is sending the prescription to the pharmacy.  Advised the care giver and pt of this and the patient would rather take the tramadol because it helps her so much better than the gabapentin. So she stated that she will continue to take the tramadol 3 times a day and not take the gabapentin. Routing to the practice for review.   Reason for Disposition . [1] Caller has URGENT medication question about med that PCP or specialist prescribed AND [2] triager unable to answer question  Answer Assessment - Initial Assessment Questions 1.   NAME of MEDICATION: "What medicine are you calling about?"     Gabapentin and tramadol 2.   QUESTION: "What is your question?"     Side effects of taking these medications 3.   PRESCRIBING HCP: "Who prescribed it?" Reason: if prescribed by specialist, call should be referred to that group.     Reordered by her pcp 4. SYMPTOMS: "Do you have any symptoms?"    yes 5. SEVERITY: If symptoms are present, ask "Are they mild, moderate or severe?"    Moderate  6.  PREGNANCY:  "Is there any chance that you  are pregnant?" "When was your last menstrual period?"     n/a  Protocols used: MEDICATION QUESTION CALL-A-AH

## 2019-05-20 NOTE — Telephone Encounter (Signed)
[facility] called the office regarding pt having reaction from Gabapentin and Tramadol, [facility] state that pt is experiencing dizziness, SOB, disorientation, and being sleepy. Dr Volanda Napoleon was notified and advised to decrease Pt Rx for Tramadol to 100 mg twice daily as needed for pain, and to decrease Gabapentin 400 mg to 300 mg three times daily.[facility] nurse notified pt caregiver of the changes.

## 2019-05-21 ENCOUNTER — Emergency Department (HOSPITAL_COMMUNITY): Payer: Medicare Other

## 2019-05-21 ENCOUNTER — Ambulatory Visit (HOSPITAL_COMMUNITY)
Admission: EM | Admit: 2019-05-21 | Discharge: 2019-05-21 | Disposition: A | Discharge status: Home / Self Care | Payer: Medicare Other | Source: Home / Self Care

## 2019-05-21 ENCOUNTER — Other Ambulatory Visit: Payer: Self-pay

## 2019-05-21 ENCOUNTER — Emergency Department (HOSPITAL_COMMUNITY)
Admission: EM | Admit: 2019-05-21 | Discharge: 2019-05-22 | Disposition: A | Discharge status: Home / Self Care | Payer: Medicare Other | Attending: Emergency Medicine | Admitting: Emergency Medicine

## 2019-05-21 DIAGNOSIS — Z87891 Personal history of nicotine dependence: Secondary | ICD-10-CM | POA: Insufficient documentation

## 2019-05-21 DIAGNOSIS — R079 Chest pain, unspecified: Secondary | ICD-10-CM | POA: Diagnosis not present

## 2019-05-21 DIAGNOSIS — Z79899 Other long term (current) drug therapy: Secondary | ICD-10-CM | POA: Diagnosis not present

## 2019-05-21 DIAGNOSIS — S098XXA Other specified injuries of head, initial encounter: Secondary | ICD-10-CM | POA: Diagnosis not present

## 2019-05-21 DIAGNOSIS — Y92099 Unspecified place in other non-institutional residence as the place of occurrence of the external cause: Secondary | ICD-10-CM | POA: Insufficient documentation

## 2019-05-21 DIAGNOSIS — S2242XA Multiple fractures of ribs, left side, initial encounter for closed fracture: Secondary | ICD-10-CM | POA: Diagnosis not present

## 2019-05-21 DIAGNOSIS — Y999 Unspecified external cause status: Secondary | ICD-10-CM | POA: Insufficient documentation

## 2019-05-21 DIAGNOSIS — R0602 Shortness of breath: Secondary | ICD-10-CM | POA: Diagnosis not present

## 2019-05-21 DIAGNOSIS — R0781 Pleurodynia: Secondary | ICD-10-CM | POA: Diagnosis not present

## 2019-05-21 DIAGNOSIS — M25561 Pain in right knee: Secondary | ICD-10-CM

## 2019-05-21 DIAGNOSIS — W0110XA Fall on same level from slipping, tripping and stumbling with subsequent striking against unspecified object, initial encounter: Secondary | ICD-10-CM | POA: Diagnosis not present

## 2019-05-21 DIAGNOSIS — S8991XA Unspecified injury of right lower leg, initial encounter: Secondary | ICD-10-CM | POA: Diagnosis not present

## 2019-05-21 DIAGNOSIS — W19XXXA Unspecified fall, initial encounter: Secondary | ICD-10-CM

## 2019-05-21 DIAGNOSIS — S0990XA Unspecified injury of head, initial encounter: Secondary | ICD-10-CM | POA: Diagnosis not present

## 2019-05-21 DIAGNOSIS — I509 Heart failure, unspecified: Secondary | ICD-10-CM | POA: Diagnosis not present

## 2019-05-21 DIAGNOSIS — M25461 Effusion, right knee: Secondary | ICD-10-CM | POA: Diagnosis not present

## 2019-05-21 DIAGNOSIS — R110 Nausea: Secondary | ICD-10-CM | POA: Diagnosis present

## 2019-05-21 DIAGNOSIS — Y939 Activity, unspecified: Secondary | ICD-10-CM | POA: Diagnosis not present

## 2019-05-21 DIAGNOSIS — I482 Chronic atrial fibrillation, unspecified: Secondary | ICD-10-CM | POA: Insufficient documentation

## 2019-05-21 DIAGNOSIS — H1132 Conjunctival hemorrhage, left eye: Secondary | ICD-10-CM

## 2019-05-21 LAB — CBC WITH DIFFERENTIAL/PLATELET
Abs Immature Granulocytes: 0.02 10*3/uL (ref 0.00–0.07)
Basophils Absolute: 0.1 10*3/uL (ref 0.0–0.1)
Basophils Relative: 1 %
Eosinophils Absolute: 0.1 10*3/uL (ref 0.0–0.5)
Eosinophils Relative: 2 %
HCT: 41.1 % (ref 36.0–46.0)
Hemoglobin: 12.9 g/dL (ref 12.0–15.0)
Immature Granulocytes: 0 %
Lymphocytes Relative: 6 %
Lymphs Abs: 0.3 10*3/uL — ABNORMAL LOW (ref 0.7–4.0)
MCH: 29.1 pg (ref 26.0–34.0)
MCHC: 31.4 g/dL (ref 30.0–36.0)
MCV: 92.6 fL (ref 80.0–100.0)
Monocytes Absolute: 0.5 10*3/uL (ref 0.1–1.0)
Monocytes Relative: 8 %
Neutro Abs: 4.9 10*3/uL (ref 1.7–7.7)
Neutrophils Relative %: 83 %
Platelets: 228 10*3/uL (ref 150–400)
RBC: 4.44 MIL/uL (ref 3.87–5.11)
RDW: 13.7 % (ref 11.5–15.5)
WBC: 6 10*3/uL (ref 4.0–10.5)
nRBC: 0 % (ref 0.0–0.2)

## 2019-05-21 LAB — COMPREHENSIVE METABOLIC PANEL
ALT: 19 U/L (ref 0–44)
AST: 28 U/L (ref 15–41)
Albumin: 3.8 g/dL (ref 3.5–5.0)
Alkaline Phosphatase: 86 U/L (ref 38–126)
Anion gap: 13 (ref 5–15)
BUN: 12 mg/dL (ref 8–23)
CO2: 26 mmol/L (ref 22–32)
Calcium: 8.9 mg/dL (ref 8.9–10.3)
Chloride: 94 mmol/L — ABNORMAL LOW (ref 98–111)
Creatinine, Ser: 0.85 mg/dL (ref 0.44–1.00)
GFR calc Af Amer: >60 mL/min (ref >60)
GFR calc non Af Amer: >60 mL/min (ref >60)
Glucose, Bld: 100 mg/dL — ABNORMAL HIGH (ref 70–99)
Potassium: 3.5 mmol/L (ref 3.5–5.1)
Sodium: 133 mmol/L — ABNORMAL LOW (ref 135–145)
Total Bilirubin: 0.8 mg/dL (ref 0.3–1.2)
Total Protein: 7.5 g/dL (ref 6.5–8.1)

## 2019-05-21 LAB — LACTIC ACID, PLASMA: Lactic Acid, Venous: 1.4 mmol/L (ref 0.5–1.9)

## 2019-05-21 MED ORDER — SODIUM CHLORIDE 0.9% FLUSH: 3.0000 mL | Freq: Once | INTRAVENOUS | Status: DC

## 2019-05-21 NOTE — ED Provider Notes (Signed)
[facility] EMERGENCY DEPARTMENT Provider Note   CSN: [id number] Arrival date & time: 05/21/19  1611     History   Chief Complaint Chief Complaint  Patient presents with   Fall   Nausea    sob    HPI Stacey [name] is a 85 y.o. female.     The history is provided by the patient and medical records.  Fall Associated symptoms include chest pain and shortness of breath.     85 y.o. F with hx of AFIB on coumadin, kyphosis, vertigo, CHF, neuropathy, OA, presenting to the ED following a fall that occurred 3 days ago.  Patient reports she lives with daughter who left for work around 9:30 AM on Tuesday.  States her caregiver does not arrive until 10:00 so there is a 30-minute window where she is by herself.  States on Tuesday she tripped over her dog and fell onto carpeted floor, mostly on the right knee but then fell to her left with impact on her left ribs.  She did strike her head on the nearby hardwood floor but denies loss of consciousness.  States she waited in the floor until her caregiver arrived.  She was encouraged to be seen in the ER but she felt okay.  Reports over the past she has been experiencing intermittent headaches, fatigue, nausea, and poor sleep.  She denies and vomiting.  She has had continued pain in her left ribs and some occasional SOB but denies this currently.  She denies cough, fever, chills, or sweats.  Also some right knee pain, states this bothers her anytime she falls.  Daughter/caregiver were trying to make her an appt with PCP but he wanted her evaluated here for frequent falls and ongoing symptoms.  Patient does admit she was seen in the ER last week for vertigo type symptoms, states she has not been experiencing any of that over the past few days.  Of note, caregiver also called PCP office yesterday regarding patient's medication and experiencing some possible side effects, particularly with the gabapentin, trazadone, and  tramadol.  Past Medical History:  Diagnosis Date   Atrial fibrillation (HCC)    Hernia of abdominal cavity    Kyphosis     Patient Active Problem List   Diagnosis Date Noted   Stenosis of both subclavian arteries 05/17/2019   Conductive hearing loss, bilateral 07/03/2018   Bilateral impacted cerumen 07/03/2018   Acute swimmer's ear of left side 07/03/2018   Long term (current) use of anticoagulants [Z79.01] 07/25/2017   Chronic atrial fibrillation (HCC) 07/11/2017   Primary osteoarthritis involving multiple joints 07/11/2017   Pseudogout 07/11/2017   Neuropathy 07/11/2017   Lymphedema 07/11/2017   Kyphosis of cervical region 07/11/2017   Congestive heart failure (HCC) 07/11/2017    Past Surgical History:  Procedure Laterality Date   ABDOMINAL HYSTERECTOMY     TONSILLECTOMY       OB History   No obstetric history on file.      Home Medications    Prior to Admission medications   Medication Sig Start Date End Date Taking? Authorizing Provider  atorvastatin (LIPITOR) 10 MG tablet Take 1 tablet (10 mg total) by mouth daily. 04/09/19   [name], MD  cyanocobalamin 1000 MCG tablet Take 1,000 mcg by mouth daily.    [provider]  desloratadine (CLARINEX) 5 MG tablet Take 5 mg by mouth daily.    [provider]  gabapentin (NEURONTIN) 300 MG capsule Take 1 capsule (  300 mg total) by mouth 3 (three) times daily. 05/20/19   Billie Ruddy, MD  meclizine (ANTIVERT) 12.5 MG tablet Take 1 tablet (12.5 mg total) by mouth 3 (three) times daily as needed for dizziness. 09/14/18   Billie Ruddy, MD  metoprolol tartrate (LOPRESSOR) 100 MG tablet TAKE 1 TABLET TWICE A DAY 02/05/19   Billie Ruddy, MD  Multiple Vitamin (MULTIVITAMIN) capsule Take 1 capsule by mouth daily.    [provider]  omeprazole (PRILOSEC) 20 MG capsule TAKE 1 CAPSULE TWICE A DAY BEFORE MEALS Patient taking differently: Take 20 mg by mouth 2 (two) times daily  before a meal.  03/16/19   Billie Ruddy, MD  Polyethyl Glycol-Propyl Glycol (SYSTANE FREE OP) Place 1 drop into both eyes 3 (three) times daily.    [provider]  potassium chloride SA (K-DUR,KLOR-CON) 20 MEQ tablet Take 1 tablet (20 mEq total) by mouth daily. 06/12/18 06/12/19  Belva Crome, MD  predniSONE (DELTASONE) 5 MG tablet Take 1 tablet (5 mg total) by mouth daily with breakfast. 02/05/19   Billie Ruddy, MD  sertraline (ZOLOFT) 100 MG tablet TAKE 1 TABLET DAILY 03/08/19   Billie Ruddy, MD  spironolactone (ALDACTONE) 25 MG tablet TAKE ONE-HALF (1/2) TABLET DAILY 11/13/18   Billie Ruddy, MD  torsemide (DEMADEX) 20 MG tablet TAKE 2 TABLETS DAILY 10/21/18   Belva Crome, MD  traMADol (ULTRAM) 50 MG tablet Take 2 tablets (100 mg total) by mouth 2 (two) times daily as needed. 05/20/19   Billie Ruddy, MD  VITAMIN D, CHOLECALCIFEROL, PO Take 1,000 Units by mouth daily.     [provider]  warfarin (COUMADIN) 5 MG tablet Take 2.5 mg on Wednesday and Saturday.  5 mg all other days Patient taking differently: Take 2.5-5 mg by mouth See admin instructions. Take 2.5 mg on Wednesday and Saturday.  Then take 5 mg by mouth all other days 06/26/18   Billie Ruddy, MD    Family History No family history on file.  Social History Social History   Tobacco Use   Smoking status: Former Smoker   Smokeless tobacco: Never Used  Substance Use Topics   Alcohol use: No    Frequency: Never   Drug use: No     Allergies   Sulfa antibiotics, Codeine, Latex, Neosporin original [bacitracin-neomycin-polymyxin], and Polysporin [bacitracin-polymyxin b]   Review of Systems Review of Systems  Constitutional: Positive for fatigue.  Respiratory: Positive for shortness of breath.   Cardiovascular: Positive for chest pain.  Musculoskeletal: Positive for arthralgias.  All other systems reviewed and are negative.    Physical Exam Updated Vital Signs BP (!) 100/57 (BP  Location: Left Arm)    Pulse 99    Temp 97.6 F (36.4 C) (Oral)    Resp 16    SpO2 93%   Physical Exam Vitals signs and nursing note reviewed.  Constitutional:      Appearance: She is well-developed.  HENT:     Head: Normocephalic and atraumatic.  Eyes:     Conjunctiva/sclera:     Left eye: Hemorrhage present.     Pupils: Pupils are equal, round, and reactive to light.   Neck:     Musculoskeletal: Normal range of motion.  Cardiovascular:     Rate and Rhythm: Normal rate and regular rhythm.     Heart sounds: Normal heart sounds.  Pulmonary:     Effort: Pulmonary effort is normal.     Breath  sounds: Normal breath sounds.  Chest:     Comments: Some tenderness of left upper lateral chest wall, no bruising or gross deformity noted Abdominal:     General: Bowel sounds are normal.     Palpations: Abdomen is soft.  Musculoskeletal: Normal range of motion.       Legs:     Comments: Mild bruising of right knee just below the patella, no gross deformity or effusion noted  Skin:    General: Skin is warm and dry.  Neurological:     Mental Status: She is alert and oriented to person, place, and time.     Comments: AAOx3, answering questions and following commands appropriately, moving extremities well without ataxia, speech clear and goal oriented      ED Treatments / Results  Labs (all labs ordered are listed, but only abnormal results are displayed) Labs Reviewed  COMPREHENSIVE METABOLIC PANEL - Abnormal; Notable for the following components:      Result Value   Sodium 133 (*)    Chloride 94 (*)    Glucose, Bld 100 (*)    All other components within normal limits  CBC WITH DIFFERENTIAL/PLATELET - Abnormal; Notable for the following components:   Lymphs Abs 0.3 (*)    All other components within normal limits  PROTIME-INR - Abnormal; Notable for the following components:   Prothrombin Time 21.5 (*)    INR 1.9 (*)    All other components within normal limits  LACTIC ACID,  PLASMA  URINALYSIS, ROUTINE W REFLEX MICROSCOPIC  CK  TROPONIN I (HIGH SENSITIVITY)    EKG None  Radiology Dg Ribs Unilateral Left  Result Date: 05/22/2019 CLINICAL DATA:  Pain EXAM: LEFT RIBS - 2 VIEW COMPARISON:  Chest x-ray dated 05/21/2019 FINDINGS: Findings are suspicious for a minimally displaced fracture involving the posterior seventh rib on the left. There may be a nondisplaced fracture involving the posterior eighth and ninth ribs on the left. There is no pneumothorax. IMPRESSION: 1. Findings suspicious for a minimally displaced fracture involving the posterior left seventh rib. 2. Possible nondisplaced fracture involving the posterior eighth and ninth ribs on the left. 3. No pneumothorax. Electronically Signed   By: Katherine Mantlehristopher  Green M.D.   On: 05/22/2019 00:21   Ct Head Wo Contrast  Result Date: 05/21/2019 CLINICAL DATA:  Fall.  On anticoagulation. EXAM: CT HEAD WITHOUT CONTRAST TECHNIQUE: Contiguous axial images were obtained from the base of the skull through the vertex without intravenous contrast. COMPARISON:  MR brain and CT head dated May 13, 2019. FINDINGS: Brain: No evidence of acute infarction, hemorrhage, hydrocephalus, extra-axial collection or mass lesion/mass effect. Unchanged mild atrophy. Vascular: Calcified atherosclerosis at the skullbase. No hyperdense vessel. Skull: Normal. Negative for fracture or focal lesion. Sinuses/Orbits: No acute finding. Other: None. IMPRESSION: 1.  No acute intracranial abnormality. Electronically Signed   By: Obie DredgeWilliam T Derry M.D.   On: 05/21/2019 23:56   Ct Knee Right Wo Contrast  Result Date: 05/22/2019 CLINICAL DATA:  Knee pain EXAM: CT OF THE RIGHT KNEE WITHOUT CONTRAST TECHNIQUE: Multidetector CT imaging of the RIGHT knee was performed according to the standard protocol. Multiplanar CT image reconstructions were also generated. COMPARISON:  CT from same day FINDINGS: Bones/Joint/Cartilage There is no displaced fracture. There is  diffuse osteopenia which limits detection of nondisplaced fractures. There are advanced multicompartmental degenerative changes. Ligaments Suboptimally assessed by CT. Muscles and Tendons Unremarkable. Soft tissues There is a small joint effusion. Vascular calcifications are noted. There is a Secondary school teachersmall Baker cyst.  IMPRESSION: 1. No displaced fracture. 2. Advanced multicompartmental degenerative changes. 3. Small joint effusion. 4. Small Baker cyst. Electronically Signed   By: Katherine Mantlehristopher  Green M.D.   On: 05/22/2019 01:59   Dg Chest Portable 1 View  Result Date: 05/21/2019 CLINICAL DATA:  Chest pain after a fall. EXAM: PORTABLE CHEST 1 VIEW COMPARISON:  01/24/2018 FINDINGS: Osteopenia. Reverse apical lordotic positioning. Moderate cardiomegaly. No pleural effusion or pneumothorax. Bibasilar scarring or atelectasis. IMPRESSION: Cardiomegaly, without acute disease. Electronically Signed   By: Jeronimo GreavesKyle  Talbot M.D.   On: 05/21/2019 18:15   Dg Knee Complete 4 Views Right  Result Date: 05/22/2019 CLINICAL DATA:  Pain status post fall EXAM: RIGHT KNEE - COMPLETE 4+ VIEW COMPARISON:  None. FINDINGS: There is osteopenia which limits detection of nondisplaced fractures. There is advanced multicompartmental osteoarthritis. There is a lucency through the posterolateral tibial plateau. This is only visualized on a single view. Vascular calcifications are noted. IMPRESSION: 1. No definite acute displaced fracture or dislocation. However, evaluation is limited by osteopenia. 2. Subtle lucency through the posterolateral tibial plateau. In the absence of a joint effusion, this is favored to represent artifact. However, if there is high clinical suspicion for nondisplaced fracture, follow-up with CT is recommended. Electronically Signed   By: Katherine Mantlehristopher  Green M.D.   On: 05/22/2019 00:24    Procedures Procedures (including critical care time)  Medications Ordered in ED Medications  sodium chloride flush (NS) 0.9 %  injection 3 mL (3 mLs Intravenous Not Given 05/21/19 2304)     Initial Impression / Assessment and Plan / ED Course  I have reviewed the triage vital signs and the nursing notes.  Pertinent labs & imaging results that were available during my care of the patient were reviewed by me and considered in my medical decision making (see chart for details).  85 year old female here after a fall that occurred 3 days ago.  States she got tripped up by her dog, fell onto carpet and struck her head on the hardwood floor.  There was no loss of consciousness.  States she waited for her caregiver to arrive and was assisted up.  She declined medical evaluation at that time.  Over the past few days she has had some headaches, fatigue, nausea, and poor appetite.  Caregiver discussed this with PCP, concerned if this was due to her medications versus fall and recommended ER evaluation.  Patient is awake, alert, peripherally oriented here.  She does not have any focal neurologic deficits.  She does have a left subconjunctival hemorrhage but no other visible signs of head trauma.  Some tenderness of the left lateral ribs and right knee without gross deformity.  Her lungs are clear and she is in no acute respiratory distress.  She did have chest x-ray and basic labs from triage which are overall reassuring.  She is anticoagulated with Coumadin due to history of A. fib so will obtain CT of the head as well as dedicated left rib and right knee  films, add troponin, CK, and urinalysis.  CT head negative.  Rib films with findings concerning for minimally displaced fracture of the posterior left 7th rib and possible nondisplaced fracture of the posterior eighth and ninth left ribs as well.  No pneumothorax.  Patient has remained hemodynamically stable on room air.  She remains without any signs of respiratory distress.  Knee films with subtle lucency through the posterior lateral tibial plateau, cannot definitively exclude fracture  so CT was obtained which is negative aside from  a small effusion.  Urinalysis without signs of infection.  Patient remains comfortable here, she is not requiring any supplemental O2, remains AAOx3 to her baseline.  Feel she is stable for discharge home with symptomatic management including incentive spirometry.  She has home tramadol that she can take for pain, can add additional tylenol if needed.  Can follow-up with PCP, defer any further medications adjustments to them.  Patient and daughter comfortable with care plan.  They will return here for any new/acute changes.  Case discussed with attending physician, Dr. Clayborne Dana, who evaluated patient and agrees with assessment and plan of care.  Final Clinical Impressions(s) / ED Diagnoses   Final diagnoses:  Rib pain  Fall, initial encounter  Closed fracture of multiple ribs of left side, initial encounter  Subconjunctival hemorrhage of left eye  Acute pain of right knee    ED Discharge Orders    None       Garlon Hatchet, PA-C 05/22/19 0354    Marily Memos, MD 05/22/19 [phone]

## 2019-05-21 NOTE — ED Notes (Signed)
Pt is here for continued falls, possible dehydration, and issues with her medications.  Dr. Joseph Art stated he would like for the pt to be seen in the ED due to the high number of medications, her comorbidity's and her age.  Pt and care giver were agreeable to this plan and the care giver is going to bring her to the ED via POV.  Pt was A&O.

## 2019-05-21 NOTE — ED Triage Notes (Addendum)
Pt states her dog knocked her down on Tues.  No loc.  She landed on both knees and the L side of her rib cage.  Since Wed she has been nauseated and has been sob every time she walks.

## 2019-05-22 ENCOUNTER — Emergency Department (HOSPITAL_COMMUNITY): Payer: Medicare Other

## 2019-05-22 DIAGNOSIS — M25561 Pain in right knee: Secondary | ICD-10-CM | POA: Diagnosis not present

## 2019-05-22 DIAGNOSIS — S2242XA Multiple fractures of ribs, left side, initial encounter for closed fracture: Secondary | ICD-10-CM | POA: Diagnosis not present

## 2019-05-22 DIAGNOSIS — R0781 Pleurodynia: Secondary | ICD-10-CM | POA: Diagnosis not present

## 2019-05-22 DIAGNOSIS — M25461 Effusion, right knee: Secondary | ICD-10-CM | POA: Diagnosis not present

## 2019-05-22 DIAGNOSIS — S8991XA Unspecified injury of right lower leg, initial encounter: Secondary | ICD-10-CM | POA: Diagnosis not present

## 2019-05-22 LAB — PROTIME-INR
INR: 1.9 — ABNORMAL HIGH (ref 0.8–1.2)
Prothrombin Time: 21.5 seconds — ABNORMAL HIGH (ref 11.4–15.2)

## 2019-05-22 LAB — URINALYSIS, ROUTINE W REFLEX MICROSCOPIC
Bilirubin Urine: NEGATIVE
Glucose, UA: NEGATIVE mg/dL
Hgb urine dipstick: NEGATIVE
Ketones, ur: NEGATIVE mg/dL
Leukocytes,Ua: NEGATIVE
Nitrite: NEGATIVE
Protein, ur: NEGATIVE mg/dL
Specific Gravity, Urine: 1.006 (ref 1.005–1.030)
pH: 7 (ref 5.0–8.0)

## 2019-05-22 LAB — CK: Total CK: 51 U/L (ref 38–234)

## 2019-05-22 LAB — TROPONIN I (HIGH SENSITIVITY): Troponin I (High Sensitivity): 7 ng/L (ref <18)

## 2019-05-22 MED ORDER — SODIUM CHLORIDE 0.9 % IV BOLUS
500.0000 mL | Freq: Once | INTRAVENOUS | Status: AC
  Administered 2019-05-22: 500 mL via INTRAVENOUS

## 2019-05-22 NOTE — ED Provider Notes (Signed)
Medical screening examination/treatment/procedure(s) were conducted as a shared visit with non-physician practitioner(s) and myself.  I personally evaluated the patient during the encounter.  Here for mechanicall fall and generalized weakness with dreased appetite/n/v and decreased po intake.  Exam with dry mm. Heart/lungs WNL. ttp around right knee and left lateral ribs.  Plan for ct knee. Hydration, labs. Likely dc unless something abnormal.   EKG Interpretation  Date/Time:  Friday May 21 2019 17:12:06 EDT Ventricular Rate:  77 PR Interval:    QRS Duration: 80 QT Interval:  418 QTC Calculation: 473 R Axis:   109 Text Interpretation:  Atrial fibrillation Possible Right ventricular hypertrophy Abnormal ECG No significant change since last tracing Confirmed by Merrily Pew [phone]) on 05/21/2019 11:29:10 PM     Chi Woodham, Corene Cornea, MD 05/22/19 [phone]

## 2019-05-22 NOTE — ED Notes (Signed)
Pt verbalized understanding of d/c instructions and follow up care. 

## 2019-05-22 NOTE — Discharge Instructions (Signed)
Continue your tramadol.  Can add tylenol to this if needed.  Wear knee brace to help support knee. Use incentive spirometer several times a day to keep lungs strong. Follow-up closely with your primary care doctor. Return here for any new/acute changes.

## 2019-05-25 ENCOUNTER — Telehealth: Payer: Self-pay

## 2019-05-25 NOTE — Telephone Encounter (Signed)
Copied from [place] [phone]. Topic: General - Other >> May 25, 2019  2:43 PM Pauline Good wrote: Reason for CRM: pt want her Tramadol changed back to 2 pill 3x's daily, because she is in pain. Please advise

## 2019-05-26 NOTE — Telephone Encounter (Signed)
Please advise 

## 2019-05-28 NOTE — Telephone Encounter (Signed)
Pt's tramadol was to be prn not scheduled TID.  Pt's caregiver called with concerns of sedation so gabapentin and tramadol were reduced.  Would advised using Tylenol prn if needed for breakthrough pain.  Consider OTC biofreeze.

## 2019-05-28 NOTE — Telephone Encounter (Signed)
Spoke with pt caregiver verbalized understanding of Dr Volanda Napoleon advise regarding pt Tramadol.

## 2019-06-01 ENCOUNTER — Ambulatory Visit (INDEPENDENT_AMBULATORY_CARE_PROVIDER_SITE_OTHER): Payer: Medicare Other | Admitting: General Practice

## 2019-06-01 DIAGNOSIS — Z7901 Long term (current) use of anticoagulants: Secondary | ICD-10-CM | POA: Diagnosis not present

## 2019-06-01 DIAGNOSIS — I482 Chronic atrial fibrillation, unspecified: Secondary | ICD-10-CM | POA: Diagnosis not present

## 2019-06-01 LAB — POCT INR: INR: 2.9 (ref 2.0–3.0)

## 2019-06-01 NOTE — Patient Instructions (Signed)
Pre visit review using our clinic review tool, if applicable. No additional management support is needed unless otherwise documented below in the visit note.  Continue to take 5 mg daily except 2.5 mg on Wednesdays and Saturdays.  Dosing instructions given to daughter,  Janice [phone].  Re-check in 1 to 2 weeks.  Janice verbalized understanding.    

## 2019-06-01 NOTE — Progress Notes (Signed)
Medical screening examination/treatment/procedure(s) were performed by non-physician practitioner and as supervising physician I was immediately available for consultation/collaboration. I agree with above. Daliana Leverett, MD   

## 2019-06-03 DIAGNOSIS — Z7952 Long term (current) use of systemic steroids: Secondary | ICD-10-CM | POA: Diagnosis not present

## 2019-06-03 DIAGNOSIS — S2241XD Multiple fractures of ribs, right side, subsequent encounter for fracture with routine healing: Secondary | ICD-10-CM | POA: Diagnosis not present

## 2019-06-03 DIAGNOSIS — M150 Primary generalized (osteo)arthritis: Secondary | ICD-10-CM | POA: Diagnosis not present

## 2019-06-03 DIAGNOSIS — M1120 Other chondrocalcinosis, unspecified site: Secondary | ICD-10-CM | POA: Diagnosis not present

## 2019-06-03 DIAGNOSIS — M2550 Pain in unspecified joint: Secondary | ICD-10-CM | POA: Diagnosis not present

## 2019-06-03 DIAGNOSIS — M154 Erosive (osteo)arthritis: Secondary | ICD-10-CM | POA: Diagnosis not present

## 2019-06-13 DIAGNOSIS — I4821 Permanent atrial fibrillation: Secondary | ICD-10-CM | POA: Diagnosis not present

## 2019-06-13 DIAGNOSIS — Z7901 Long term (current) use of anticoagulants: Secondary | ICD-10-CM | POA: Diagnosis not present

## 2019-06-15 LAB — POCT INR: INR: 2.4 (ref 2.0–3.0)

## 2019-06-17 ENCOUNTER — Other Ambulatory Visit: Payer: Self-pay

## 2019-06-17 ENCOUNTER — Ambulatory Visit (INDEPENDENT_AMBULATORY_CARE_PROVIDER_SITE_OTHER): Payer: Medicare Other | Admitting: Family Medicine

## 2019-06-17 VITALS — BP 98/58 | HR 82 | Temp 98.3°F | Wt 109.4 lb

## 2019-06-17 DIAGNOSIS — I482 Chronic atrial fibrillation, unspecified: Secondary | ICD-10-CM | POA: Diagnosis not present

## 2019-06-17 DIAGNOSIS — G629 Polyneuropathy, unspecified: Secondary | ICD-10-CM | POA: Diagnosis not present

## 2019-06-17 DIAGNOSIS — Z9181 History of falling: Secondary | ICD-10-CM | POA: Diagnosis not present

## 2019-06-17 DIAGNOSIS — J302 Other seasonal allergic rhinitis: Secondary | ICD-10-CM | POA: Diagnosis not present

## 2019-06-17 DIAGNOSIS — I509 Heart failure, unspecified: Secondary | ICD-10-CM | POA: Diagnosis not present

## 2019-06-17 MED ORDER — METOPROLOL TARTRATE 50 MG PO TABS: 50.0000 mg | ORAL_TABLET | Freq: Two times a day (BID) | ORAL | 3 refills | Status: DC

## 2019-06-17 NOTE — Progress Notes (Signed)
Subjective:    Patient ID: Stacey [name], female    DOB: Jan 29, 1933, 85 y.o.   MRN: [id number]  No chief complaint on file. Pt accompanied by her caregiver Myra.  HPI Patient is a 85 yo female with afib on coumadin, CHF, kyphosis, OA, neuropathy, hearing loss, b/l subclavian artery stenosis seen today for f/u.  Pt endorse recent ED visit for a fall.  Pt found to have several rib fxs on L side. Pt states she is doing better now.  Pt questioned about medications as there were concerns pt was taking too many sedating meds.  Pt states she recently started taking Gabapentin 400 mg capsules TID.  Pt says she was on this yrs ago, but stopped taking it.  She felt like it would help with her arm so she started taking it again.  Pt is not always using cane.  Endorses feeling tired.  Pt notes post nasal drainage.  Taking Clarinex x yrs, but not sure if it is working.    Past Medical History:  Diagnosis Date  . Atrial fibrillation (HCC)   . Hernia of abdominal cavity   . Kyphosis     Allergies  Allergen Reactions  . Sulfa Antibiotics Anaphylaxis  . Codeine Nausea And Vomiting  . Latex Rash  . Neosporin Original [Bacitracin-Neomycin-Polymyxin] Rash  . Polysporin [Bacitracin-Polymyxin B] Rash    ROS General: Denies fever, chills, night sweats, changes in weight, changes in appetite  +fatigue HEENT: Denies headaches, ear pain, changes in vision, rhinorrhea, sore throat  +nasal drainage CV: Denies CP, palpitations, SOB, orthopnea Pulm: Denies SOB, cough, wheezing GI: Denies abdominal pain, nausea, vomiting, diarrhea, constipation GU: Denies dysuria, hematuria, frequency, vaginal discharge Msk: Denies muscle cramps, joint pains  +LUE pain, kyphosis Neuro: Denies weakness, numbness, tingling Skin: Denies rashes, bruising Psych: Denies depression, anxiety, hallucinations     Objective:    Blood pressure (!) 98/58, pulse 82, temperature 98.3 F (36.8 C), temperature source Oral, weight 109  lb 6.4 oz (49.6 kg), SpO2 95 %.   Gen. Pleasant, well-nourished, in no distress, normal affect   HEENT: [place]/AT, face symmetric, conjunctiva clear, no scleral icterus, PERRLA, nares patent without drainage, pharynx without erythema or exudate.  TMs normal b/l. Lungs: no accessory muscle use, CTAB, no wheezes or rales Cardiovascular: RRR, no m/r/g, no peripheral edema Musculoskeletal: severe kyphosis, no cyanosis or clubbing, normal tone Neuro:  A&Ox3, CN II-XII intact, sitting in transport wheelchair, gait affected by kyphosis Skin:  Warm, no lesions/ rash   Wt Readings from Last 3 Encounters:  06/17/19 109 lb 6.4 oz (49.6 kg)  05/13/19 105 lb (47.6 kg)  11/23/18 106 lb 8 oz (48.3 kg)    Lab Results  Component Value Date   WBC 6.0 05/21/2019   HGB 12.9 05/21/2019   HCT 41.1 05/21/2019   PLT 228 05/21/2019   GLUCOSE 100 (H) 05/21/2019   ALT 19 05/21/2019   AST 28 05/21/2019   NA 133 (L) 05/21/2019   K 3.5 05/21/2019   CL 94 (L) 05/21/2019   CREATININE 0.85 05/21/2019   BUN 12 05/21/2019   CO2 26 05/21/2019   INR 2.9 06/01/2019    Assessment/Plan: Pt seen for ED f/u and med management after fall.  Given concern for increased somnolence possible will decrease dose of gabapentin.  Provider was not aware pt was off gabapentin prior to establishing care, then recently restarted 400 mg several times per day.  Pt also strongly encouraged not to schedule prn medications, but to  take the if needed.  Chronic atrial fibrillation (HCC)  -controlled. -concern hypotension may be contributing to increased falls -will d/c lopressor 100 mg BID and start 50 mg bid.   -will monitor bp and pulse closely.  If needed will increase dose to 75 mg BID. - Plan: metoprolol tartrate (LOPRESSOR) 50 MG tablet  Chronic congestive heart failure, unspecified heart failure type (HCC)  -limit sodium intake -monitor for wt gain, LE edema, and SOB -metoprolol tartrate 100 mg d/c'd as hypotension possibly  contributing to fatigue and increased falls. -will start metoprolol 50 mg BID -continue f/u with Cardiology - Plan: metoprolol tartrate (LOPRESSOR) 50 MG tablet  Seasonal allergies -will d/c clarinex  -will start Allegra 180 mg   Pt to take 1/2 tab daily  -also discussed using saline nasal rinse.  Neuropathy -provider made aware this visit pt restarted gabapentin. -advised will need to use medication with caution  -pt advised to d/c gabapentin 400 mg multiple times per day. -will use gabapentin 300 mg daily prn.  Initially advised on a lower doss.  Pt wishes to take 300 mg as she just got a refill.    -advised may still need to decrease dose.  Personal h/o falls -likely multifactorial: altered balance 2/2 severe kyphosis and medications causing hypotension and fatigue (gabapentin, metoprolol, tramadol). -pt advised to use cane or other device to aid with ambulation  F/u in the next 2-3 wks.  Grier Mitts, MD

## 2019-06-17 NOTE — Patient Instructions (Addendum)
We are making several changes to your medications. Your blood pressure being low may be contributing to you feeling so tired.  We will decrease the dose of your Metoprolol from 100 mg twice a day to 50 mg twice a day. You can take the Gabapentin 300 mg capsules once per day if needed for nerve pain.  We can see how this works and adjust the dose as needed. We also discussed stopping the clarinex and switching to half a tab of allegra for your allergies. Living With Heart Failure  Heart failure is a long-term (chronic) condition in which the heart cannot pump enough blood through the body. When this happens, parts of the body do not get the blood and oxygen they need. There is no cure for heart failure at this time, so it is important for you to take good care of yourself and follow the treatment plan set by your health care provider. If you are living with heart failure, there are ways to help you manage the disease. Follow these instructions at home: Living with heart failure requires you to make changes in your life. Your health care team will teach you about the changes you need to make in order to relieve your symptoms and lower your risk of going to the hospital. Follow the treatment plan as set by your health care provider. Medicines Medicines are important in reducing your heart's workload, slowing the progression of heart failure, and improving your symptoms.  Take over-the-counter and prescription medicines only as told by your health care provider.  Do not stop taking your medicine unless your health care provider tells you to do that.  Do not skip any dose of your medicine.  Refill prescriptions before you run out of medicine. You need your medicines every day. Eating and drinking   Eat heart-healthy foods. Talk with a dietitian to make an eating plan that is right for you. ? If directed by your health care provider: ? Limit salt (sodium). Lowering your sodium intake may reduce  symptoms of heart failure. Ask a dietitian to recommend heart-healthy seasonings. ? Limit your fluid intake. Fluid restriction may reduce symptoms of heart failure. ? Use low-fat cooking methods instead of frying. Low-fat methods include roasting, grilling, broiling, baking, poaching, steaming, and stir-frying. ? Choose foods that contain no trans fat and are low in saturated fat and cholesterol. Healthy choices include fresh or frozen fruits and vegetables, fish, lean meats, legumes, fat-free or low-fat dairy products, and whole-grain or high-fiber foods.  Limit alcohol intake to no more than 1 drink a day for nonpregnant women and 2 drinks a day for men. One drink equals 12 oz of beer, 5 oz of wine, or 1 oz of hard liquor. ? Drinking more than that is harmful to your heart. Tell your health care provider if you drink alcohol several times a week. ? Talk with your health care provider about whether any level of alcohol use is safe for you. Activity   Ask your health care provider about attending cardiac rehabilitation. These programs include aerobic physical activity, which provides many benefits for your heart.  If no cardiac rehabilitation program is available, ask your health care provider what aerobic exercises are safe for you to do. Lifestyle Make the lifestyle changes recommended by your health care provider. In general:  Lose weight if your health care provider tells you to do that. Weight loss may reduce symptoms of heart failure.  Do not use any products that contain nicotine  or tobacco, such as cigarettes or e-cigarettes. If you need help quitting, ask your health care provider.  Do not use street (illegal) drugs.  Return to your normal activities as told by your health care provider. Ask your health care provider what activities are safe for you. General instructions   Make sure you weigh yourself every day to track your weight. Rapid weight gain may indicate an increase in  fluid in your body and may increase the workload of your heart. ? Weigh yourself every morning. Do this after you urinate but before you eat breakfast. ? Wear the same type of clothing, without shoes, each time you weigh yourself. ? Weigh yourself on the same scale and in the same spot each time.  Living with chronic heart failure often leads to emotions such as fear, stress, anxiety, and depression. If you feel any of these emotions and need help coping, contact your health care provider. Other ways to get help include: ? Talking to friends and family members about your condition. They can give you support and guidance. Explain your symptoms to them and, if comfortable, invite them to attend appointments or rehabilitation with you. ? Joining a support group for people with chronic heart failure. Talking with other people who have the same symptoms may give you new ways of coping with your disease and your emotions.  Stay up to date with your shots (vaccines). Staying current on pneumococcal and influenza vaccines is especially important in preventing germs from attacking your airways (respiratory infections).  Keep all follow-up visits as told by your health care provider. This is important. How to recognize changes in your condition You and your family members need to know what changes to watch for in your condition. Watch for the following changes and report them to your health care provider:  Sudden weight gain. Ask your health care provider what amount of weight gain to report.  Shortness of breath: ? Feeling short of breath while at rest, with no exercise or activity that required great effort. ? Feeling breathless with activity.  Swelling of your lower legs or ankles.  Difficulty sleeping: ? You wake up feeling short of breath. ? You have to use more pillows to raise your head in order to sleep.  Frequent, dry, hacking cough.  Loss of appetite.  Feeling more tired all the  time.  Depression or feelings of sadness or hopelessness.  Bloating in the stomach. Where to find more information  Local support groups. Ask your health care provider about groups near you.  The American Heart Association: www.heart.org Contact a health care provider if:  You have a rapid weight gain.  You have increasing shortness of breath that is unusual for you.  You are unable to participate in your usual physical activities.  You tire easily.  You cough more than normal, especially with physical activity.  You have any swelling or more swelling in areas such as your hands, feet, ankles, or abdomen.  You feel like your heart is beating quickly (palpitations).  You become dizzy or light-headed when you stand up. Get help right away if:  You have difficulty breathing.  You notice or your family notices a change in your awareness, such as having trouble staying awake or having difficulty with concentration.  You have pain or discomfort in your chest.  You have an episode of fainting (syncope). Summary  There is no cure for heart failure, so it is important for you to take good care of  yourself and follow the treatment plan set by your health care provider.  Medicines are important in reducing your heart's workload, slowing the progression of heart failure, and improving your symptoms.  Living with chronic heart failure often leads to emotions such as fear, stress, anxiety, and depression. If you are feeling any of these emotions and need help coping, contact your health care provider. This information is not intended to replace advice given to you by your health care provider. Make sure you discuss any questions you have with your health care provider. Document Released: 12/11/2016 Document Revised: 07/11/2017 Document Reviewed: 12/11/2016 Elsevier Patient Education  2020 Elsevier Inc.  Atrial Fibrillation Atrial fibrillation is a type of irregular or rapid  heartbeat (arrhythmia). In atrial fibrillation, the top part of the heart (atria) quivers in a chaotic pattern. This makes the heart unable to pump blood normally. Having atrial fibrillation can increase your risk for other health problems, such as:  Blood can pool in the atria and form clots. If a clot travels to the brain, it can cause a stroke.  The heart muscle may weaken from the irregular blood flow. This can cause heart failure. Atrial fibrillation may start suddenly and stop on its own, or it may become a long-lasting problem. What are the causes? This condition is caused by some heart-related conditions or procedures, including:  High blood pressure. This is the most common cause.  Heart failure.  Heart valve conditions.  Inflammation of the sac that surrounds the heart (pericarditis).  Heart surgery.  Coronary artery disease.  Certain heart rhythm disorders, such as Wolf-Parkinson-White syndrome. Other causes include:  Pneumonia.  Obstructive sleep apnea.  Lung cancer.  Thyroid problems, especially if the thyroid is overactive (hyperthyroidism).  Excessive alcohol or drug use. Sometimes, the cause of this condition is not known. What increases the risk? This condition is more likely to develop in:  Older people.  People who smoke.  People who have diabetes mellitus.  People who are overweight (obese).  Athletes who exercise vigorously.  People who have a family history. What are the signs or symptoms? Symptoms of this condition include:  A feeling that your heart is beating rapidly or irregularly.  A feeling of discomfort or pain in your chest.  Shortness of breath.  Sudden light-headedness or weakness.  Getting tired easily during exercise. In some cases, there are no symptoms. How is this diagnosed? Your health care provider may be able to detect atrial fibrillation when taking your pulse. If detected, this condition may be diagnosed  with:  Electrocardiogram (ECG).  Ambulatory cardiac monitor. This device records your heartbeats for 24 hours or more.  Transthoracic echocardiogram (TTE) to evaluate how blood flows through your heart.  Transesophageal echocardiogram (TEE) to view more detailed images of your heart.  A stress test.  Imaging tests, such as a CT scan or chest X-ray.  Blood tests. How is this treated? This condition may be treated with:  Medicines to slow down the heart rate or bring the heart's rhythm back to normal.  Medicines to prevent blood clots from forming.  Electrical cardioversion. This delivers a low-energy shock to the heart to reset its rhythm.  Ablation. This procedure destroys the part of the heart tissue that sends abnormal signals.  Left atrial appendage occlusion/excision. This seals off a common place in the atria where blood clots can form (left atrial appendage). The goal of treatment is to prevent blood clots from forming and to keep your heart beating at  a normal rate and rhythm. Treatment depends on underlying medical conditions and how you feel when you are experiencing fibrillation. Follow these instructions at home: Medicines  Take over-the counter and prescription medicines only as told by your health care provider.  If your health care provider prescribed a blood-thinning medicine (anticoagulant), take it exactly as told. Taking too much blood-thinning medicine can cause bleeding. Taking too little can enable a blood clot to form and travel to the brain, causing a stroke. Lifestyle      Do not use any products that contain nicotine or tobacco, such as cigarettes and e-cigarettes. If you need help quitting, ask your health care provider.  Do not drink beverages that contain caffeine, such as coffee, soda, and tea.  Follow diet instructions as told by your health care provider.  Exercise regularly as told by your health care provider.  Do not drink  alcohol. General instructions  If you have obstructive sleep apnea, manage your condition as told by your health care provider.  Maintain a healthy weight. Do not use diet pills unless your health care provider approves. Diet pills may make heart problems worse.  Keep all follow-up visits as told by your health care provider. This is important. Contact a health care provider if you:  Notice a change in the rate, rhythm, or strength of your heartbeat.  Are taking an anticoagulant and you notice increased bruising.  Tire more easily when you exercise or exert yourself.  Have a sudden change in weight. Get help right away if you have:   Chest pain, abdominal pain, sweating, or weakness.  Difficulty breathing.  Blood in your vomit, stool (feces), or urine.  Any symptoms of a stroke. "BE FAST" is an easy way to remember the main warning signs of a stroke: ? B - Balance. Signs are dizziness, sudden trouble walking, or loss of balance. ? E - Eyes. Signs are trouble seeing or a sudden change in vision. ? F - Face. Signs are sudden weakness or numbness of the face, or the face or eyelid drooping on one side. ? A - Arms. Signs are weakness or numbness in an arm. This happens suddenly and usually on one side of the body. ? S - Speech. Signs are sudden trouble speaking, slurred speech, or trouble understanding what people say. ? T - Time. Time to call emergency services. Write down what time symptoms started.  Other signs of a stroke, such as: ? A sudden, severe headache with no known cause. ? Nausea or vomiting. ? Seizure. These symptoms may represent a serious problem that is an emergency. Do not wait to see if the symptoms will go away. Get medical help right away. Call your local emergency services (911 in the [place] Do not drive yourself to the hospital. Summary  Atrial fibrillation is a type of irregular or rapid heartbeat (arrhythmia).  Symptoms include a feeling that your heart  is beating fast or irregularly. In some cases, you may not have symptoms.  The condition is treated with medicines to slow down the heart rate or bring the heart's rhythm back to normal. You may also need blood-thinning medicines to prevent blood clots.  Get help right away if you have symptoms or signs of a stroke. This information is not intended to replace advice given to you by your health care provider. Make sure you discuss any questions you have with your health care provider. Document Released: 07/29/2005 Document Revised: 09/18/2017 Document Reviewed: 09/19/2017 Elsevier Patient Education  2020 Elsevier Inc. Allergic Rhinitis, Adult Allergic rhinitis is an allergic reaction that affects the mucous membrane inside the nose. It causes sneezing, a runny or stuffy nose, and the feeling of mucus going down the back of the throat (postnasal drip). Allergic rhinitis can be mild to severe. There are two types of allergic rhinitis:  Seasonal. This type is also called hay fever. It happens only during certain seasons.  Perennial. This type can happen at any time of the year. What are the causes? This condition happens when the body's defense system (immune system) responds to certain harmless substances called allergens as though they were germs.  Seasonal allergic rhinitis is triggered by pollen, which can come from grasses, trees, and weeds. Perennial allergic rhinitis may be caused by:  House dust mites.  Pet dander.  Mold spores. What are the signs or symptoms? Symptoms of this condition include:  Sneezing.  Runny or stuffy nose (nasal congestion).  Postnasal drip.  Itchy nose.  Tearing of the eyes.  Trouble sleeping.  Daytime sleepiness. How is this diagnosed? This condition may be diagnosed based on:  Your medical history.  A physical exam.  Tests to check for related conditions, such as: ? Asthma. ? Pink eye. ? Ear infection. ? Upper respiratory  infection.  Tests to find out which allergens trigger your symptoms. These may include skin or blood tests. How is this treated? There is no cure for this condition, but treatment can help control symptoms. Treatment may include:  Taking medicines that block allergy symptoms, such as antihistamines. Medicine may be given as a shot, nasal spray, or pill.  Avoiding the allergen.  Desensitization. This treatment involves getting ongoing shots until your body becomes less sensitive to the allergen. This treatment may be done if other treatments do not help.  If taking medicine and avoiding the allergen does not work, new, stronger medicines may be prescribed. Follow these instructions at home:  Find out what you are allergic to. Common allergens include smoke, dust, and pollen.  Avoid the things you are allergic to. These are some things you can do to help avoid allergens: ? Replace carpet with wood, tile, or vinyl flooring. Carpet can trap dander and dust. ? Do not smoke. Do not allow smoking in your home. ? Change your heating and air conditioning filter at least once a month. ? During allergy season:  Keep windows closed as much as possible.  Plan outdoor activities when pollen counts are lowest. This is usually during the evening hours.  When coming indoors, change clothing and shower before sitting on furniture or bedding.  Take over-the-counter and prescription medicines only as told by your health care provider.  Keep all follow-up visits as told by your health care provider. This is important. Contact a health care provider if:  You have a fever.  You develop a persistent cough.  You make whistling sounds when you breathe (you wheeze).  Your symptoms interfere with your normal daily activities. Get help right away if:  You have shortness of breath. Summary  This condition can be managed by taking medicines as directed and avoiding allergens.  Contact your health  care provider if you develop a persistent cough or fever.  During allergy season, keep windows closed as much as possible. This information is not intended to replace advice given to you by your health care provider. Make sure you discuss any questions you have with your health care provider. Document Released: 04/23/2001 Document Revised: 07/11/2017 Document  Reviewed: 09/05/2016 Elsevier Patient Education  [employer].

## 2019-06-19 ENCOUNTER — Telehealth: Payer: Self-pay

## 2019-06-19 NOTE — Telephone Encounter (Signed)
Copied from [place] [phone]. Topic: General - Other >> Jun 18, 2019 11:18 AM Pauline Good wrote: Reason for CRM: pt need to know what strength of allergia does she need to take

## 2019-06-21 ENCOUNTER — Telehealth: Payer: Self-pay | Admitting: Family Medicine

## 2019-06-21 ENCOUNTER — Encounter: Payer: Self-pay | Admitting: Family Medicine

## 2019-06-21 NOTE — Telephone Encounter (Signed)
Patient is calling to request a script for the [facility].  Discussed during OV. Please advise [phone]

## 2019-06-22 ENCOUNTER — Ambulatory Visit (INDEPENDENT_AMBULATORY_CARE_PROVIDER_SITE_OTHER): Payer: Medicare Other | Admitting: General Practice

## 2019-06-22 DIAGNOSIS — Z7901 Long term (current) use of anticoagulants: Secondary | ICD-10-CM | POA: Diagnosis not present

## 2019-06-22 DIAGNOSIS — I482 Chronic atrial fibrillation, unspecified: Secondary | ICD-10-CM

## 2019-06-22 NOTE — Patient Instructions (Signed)
Pre visit review using our clinic review tool, if applicable. No additional management support is needed unless otherwise documented below in the visit note.  Received fax on 11/10.  Continue to take 5 mg daily except 2.5 mg on Wednesdays and Saturdays.  Dosing instructions given to daughter,  Thayer Headings [phone].  Re-check in 1 to 2 weeks.  Thayer Headings verbalized understanding.

## 2019-06-22 NOTE — Progress Notes (Signed)
Medical screening examination/treatment/procedure(s) were performed by non-physician practitioner and as supervising physician I was immediately available for consultation/collaboration. I agree with above. James John, MD   

## 2019-06-23 NOTE — Telephone Encounter (Signed)
Spoke with pt caregiver verbalized understanding that pt needs to take 1/2 tablet of the 180 mg.

## 2019-06-23 NOTE — Telephone Encounter (Signed)
Patient is calling back because she said no one has return her call back from 2 days ago about what doze strength she can take for her Allegra since there is two kind, a 60 mg and a 180 mg. Please call patient back, thanks.

## 2019-06-24 NOTE — Telephone Encounter (Signed)
90 day supply Allegra / ODT / Tablets / OTC 190 MG [facility] - [place] ([place]), [place] - [address]. As per patient cheaper at [facility]. Informed patient please allow 48 to 72 hour turn around.

## 2019-06-25 ENCOUNTER — Other Ambulatory Visit: Payer: Self-pay | Admitting: *Deleted

## 2019-06-25 DIAGNOSIS — J302 Other seasonal allergic rhinitis: Secondary | ICD-10-CM

## 2019-06-25 MED ORDER — FEXOFENADINE HCL 180 MG PO TABS: 180.0000 mg | ORAL_TABLET | Freq: Every day | ORAL | 1 refills | Status: DC

## 2019-06-25 NOTE — Telephone Encounter (Signed)
That's fine

## 2019-06-25 NOTE — Telephone Encounter (Signed)
Okay to fill? 

## 2019-06-25 NOTE — Telephone Encounter (Signed)
Rx sent with permission from PCP

## 2019-06-29 ENCOUNTER — Ambulatory Visit (INDEPENDENT_AMBULATORY_CARE_PROVIDER_SITE_OTHER): Payer: Medicare Other | Admitting: General Practice

## 2019-06-29 DIAGNOSIS — Z7901 Long term (current) use of anticoagulants: Secondary | ICD-10-CM | POA: Diagnosis not present

## 2019-06-29 DIAGNOSIS — I482 Chronic atrial fibrillation, unspecified: Secondary | ICD-10-CM

## 2019-06-29 LAB — POCT INR: INR: 2.3 (ref 2.0–3.0)

## 2019-06-29 NOTE — Patient Instructions (Signed)
Pre visit review using our clinic review tool, if applicable. No additional management support is needed unless otherwise documented below in the visit note. 

## 2019-06-29 NOTE — Progress Notes (Signed)
Medical screening examination/treatment/procedure(s) were performed by non-physician practitioner and as supervising physician I was immediately available for consultation/collaboration. I agree with above. James John, MD   

## 2019-07-02 ENCOUNTER — Other Ambulatory Visit: Payer: Self-pay | Admitting: Interventional Cardiology

## 2019-07-14 ENCOUNTER — Other Ambulatory Visit: Payer: Self-pay

## 2019-07-14 ENCOUNTER — Ambulatory Visit (INDEPENDENT_AMBULATORY_CARE_PROVIDER_SITE_OTHER): Payer: Medicare Other | Admitting: General Practice

## 2019-07-14 DIAGNOSIS — Z7901 Long term (current) use of anticoagulants: Secondary | ICD-10-CM

## 2019-07-14 DIAGNOSIS — I482 Chronic atrial fibrillation, unspecified: Secondary | ICD-10-CM

## 2019-07-14 LAB — POCT INR: INR: 2.4 (ref 2.0–3.0)

## 2019-07-14 NOTE — Patient Instructions (Signed)
Pre visit review using our clinic review tool, if applicable. No additional management support is needed unless otherwise documented below in the visit note.  Continue to take 5 mg daily except 2.5 mg on Wednesdays and Saturdays.  Dosing instructions given to daughter,  Janice [phone].  Re-check in 1 to 2 weeks.  Janice verbalized understanding.    

## 2019-07-15 ENCOUNTER — Encounter: Payer: Self-pay | Admitting: Family Medicine

## 2019-07-15 ENCOUNTER — Ambulatory Visit (INDEPENDENT_AMBULATORY_CARE_PROVIDER_SITE_OTHER): Payer: Medicare Other | Admitting: Family Medicine

## 2019-07-15 VITALS — BP 118/68 | HR 58 | Temp 97.8°F | Wt 112.0 lb

## 2019-07-15 DIAGNOSIS — M1990 Unspecified osteoarthritis, unspecified site: Secondary | ICD-10-CM

## 2019-07-15 DIAGNOSIS — G629 Polyneuropathy, unspecified: Secondary | ICD-10-CM | POA: Diagnosis not present

## 2019-07-15 DIAGNOSIS — J302 Other seasonal allergic rhinitis: Secondary | ICD-10-CM

## 2019-07-15 MED ORDER — FEXOFENADINE HCL 60 MG PO TABS: 60.0000 mg | ORAL_TABLET | Freq: Every day | ORAL | 5 refills | Status: DC

## 2019-07-15 NOTE — Progress Notes (Signed)
Subjective:    Patient ID: Stacey [name], female    DOB: 03/31/1933, 85 y.o.   MRN: [id number]  No chief complaint on file. Accompanied by caregiver Myra.  HPI Patient was seen today for f/u.  Per caregiver and pt, noted improvement in pt's somnolence since medication adjustments at last [facility].  Pt states [facility] never [facility] the Allegra to her.  Pt having some L arm pain through the day after decreasing gabapentin.  Using salonpas patches which helps.  No recent falls.  Pt has an upcoming appt with Rheumatology for OA of b/l hands.  Taking low dose prednisone daily.  Inquires if medication seen on tv is an option for her.  Past Medical History:  Diagnosis Date  . Atrial fibrillation ([place])   . Hernia of abdominal cavity   . Kyphosis     Allergies  Allergen Reactions  . Sulfa Antibiotics Anaphylaxis  . Codeine Nausea And Vomiting  . Latex Rash  . Neosporin Original [Bacitracin-Neomycin-Polymyxin] Rash  . Polysporin [Bacitracin-Polymyxin B] Rash    ROS General: Denies fever, chills, night sweats, changes in weight, changes in appetite +seasonal allergies HEENT: Denies headaches, ear pain, changes in vision, rhinorrhea, sore throat CV: Denies CP, palpitations, SOB, orthopnea Pulm: Denies SOB, cough, wheezing GI: Denies abdominal pain, nausea, vomiting, diarrhea, constipation GU: Denies dysuria, hematuria, frequency, vaginal discharge Msk: Denies muscle cramps, joint pains  +L arm pain Neuro: Denies weakness, numbness, tingling Skin: Denies rashes, bruising Psych: Denies depression, anxiety, hallucinations     Objective:    Blood pressure 118/68, pulse (!) 58, temperature 97.8 F (36.6 C), temperature source Temporal, weight 112 lb (50.8 kg), SpO2 98 %.   Gen. Pleasant, well-nourished, in no distress, normal affect   HEENT: [place]/AT, face symmetric,wearing glasses, no scleral icterus, PERRLA, EOMI, nares patent without drainage.  TMs normal b/l. Lungs: no accessory  muscle use, CTAB, no wheezes or rales Cardiovascular: RRR, no m/r/g, no peripheral edema Abdomen: BS present, soft, NT/ND Musculoskeletal: kyphosis, Deformities of b/l hands.  no cyanosis or clubbing, normal tone Neuro:  A&Ox3, CN II-XII intact, sitting in transport wheelchair Skin:  Warm, no lesions/ rash   Wt Readings from Last 3 Encounters:  07/15/19 112 lb (50.8 kg)  06/17/19 109 lb 6.4 oz (49.6 kg)  05/13/19 105 lb (47.6 kg)    Lab Results  Component Value Date   WBC 6.0 05/21/2019   HGB 12.9 05/21/2019   HCT 41.1 05/21/2019   PLT 228 05/21/2019   GLUCOSE 100 (H) 05/21/2019   ALT 19 05/21/2019   AST 28 05/21/2019   NA 133 (L) 05/21/2019   K 3.5 05/21/2019   CL 94 (L) 05/21/2019   CREATININE 0.85 05/21/2019   BUN 12 05/21/2019   CO2 26 05/21/2019   INR 2.4 07/14/2019    Assessment/Plan:  Seasonal allergies  -[facility] - Plan: fexofenadine (ALLEGRA) 60 MG tablet  Neuropathy -continue gabapentin 300 mg qhs.   -discussed using Salonpas patches during the day if needed. -for continued pain, discussed using a lower dose of gabapentin during the day.  Pt will let provider known how using the patches goes. -continue tramadol 100 mg BID prn  Arthritis -Continue supportive care -Continue prednisone 5 mg daily -Encouraged to keep follow-up with rheumatology  F/u in 3 months, sooner if needed.  Grier Mitts, MD

## 2019-07-20 ENCOUNTER — Encounter: Payer: Self-pay | Admitting: Family Medicine

## 2019-07-26 ENCOUNTER — Telehealth: Payer: Self-pay | Admitting: Family Medicine

## 2019-07-26 NOTE — Telephone Encounter (Signed)
Message routed to PCP CMA  

## 2019-07-26 NOTE — Telephone Encounter (Signed)
Pt is also requesting to have all of her medications on a 90 dayu supply. Please advise.

## 2019-07-26 NOTE — Telephone Encounter (Signed)
Medication Refill - Medication: traMADol (ULTRAM) 50 MG tablet  Has the patient contacted their pharmacy? no (Agent: If no, request that the patient contact the pharmacy for the refill.) (Agent: If yes, when and what did the pharmacy advise?)  Preferred Pharmacy (with phone number or street name):  [facility] - [address], [place] Phone:  [phone]  Fax:  [phone]     Agent: Please be advised that RX refills may take up to 3 business days. We ask that you follow-up with your pharmacy.

## 2019-07-27 ENCOUNTER — Other Ambulatory Visit: Payer: Self-pay

## 2019-07-27 ENCOUNTER — Ambulatory Visit (INDEPENDENT_AMBULATORY_CARE_PROVIDER_SITE_OTHER): Payer: Medicare Other | Admitting: Podiatry

## 2019-07-27 ENCOUNTER — Encounter: Payer: Self-pay | Admitting: Podiatry

## 2019-07-27 DIAGNOSIS — L89619 Pressure ulcer of right heel, unspecified stage: Secondary | ICD-10-CM | POA: Diagnosis not present

## 2019-07-27 DIAGNOSIS — B351 Tinea unguium: Secondary | ICD-10-CM

## 2019-07-27 DIAGNOSIS — M79675 Pain in left toe(s): Secondary | ICD-10-CM | POA: Diagnosis not present

## 2019-07-27 DIAGNOSIS — M79674 Pain in right toe(s): Secondary | ICD-10-CM | POA: Diagnosis not present

## 2019-07-27 MED ORDER — MUPIROCIN 2 % EX OINT: TOPICAL_OINTMENT | CUTANEOUS | 1 refills | Status: DC

## 2019-07-27 NOTE — Patient Instructions (Addendum)
WEAR YOUR HEEL PILLOWS DAILY!!!!!   DRESSING CHANGES RIGHT HEEL :    1. KEEP RIGHT  FOOT DRY AT ALL TIMES!!!!  2. CLEANSE ULCER WITH SALINE.  3. DAB DRY WITH GAUZE SPONGE.  4. APPLY A LIGHT AMOUNT OF MUPIROCIN OINTMENT TO BASE OF ULCER.  5. APPLY OUTER DRESSING/BAND-AID AS INSTRUCTED.  6. DO NOT WALK BAREFOOT!!!  7.  IF YOU EXPERIENCE ANY FEVER, CHILLS, NIGHTSWEATS, NAUSEA OR VOMITING, ELEVATED OR LOW BLOOD SUGARS, REPORT TO EMERGENCY ROOM.  8. IF YOU EXPERIENCE INCREASED REDNESS, PAIN, SWELLING, DISCOLORATION, ODOR, PUS, DRAINAGE OR WARMTH OF YOUR FOOT, REPORT TO EMERGENCY ROOM.

## 2019-07-28 NOTE — Telephone Encounter (Signed)
Called pt left detailed message advising pt that she has enough refills at her pharmacy and Rx is not due for refill soon, Advised pt to call the office with any questions

## 2019-07-29 NOTE — Telephone Encounter (Signed)
Message Routed to PCP CMA 

## 2019-07-29 NOTE — Telephone Encounter (Signed)
Pt requesting CB from [place] [phone]

## 2019-07-29 NOTE — Telephone Encounter (Signed)
Spoke with pt states that she has enough Tramadol no refills needed at this time

## 2019-07-29 NOTE — Telephone Encounter (Signed)
Spoke with pt requests for [facility], pt states that it very expensive for her if she does not receive 90 days supply. Advised pt that I will leave a note for any [facility].

## 2019-07-29 NOTE — Telephone Encounter (Signed)
Wrong office

## 2019-08-02 NOTE — Telephone Encounter (Signed)
Patient requesting call back from [place] to discuss below message further.

## 2019-08-03 ENCOUNTER — Encounter (HOSPITAL_COMMUNITY): Payer: Self-pay

## 2019-08-03 ENCOUNTER — Ambulatory Visit: Payer: Self-pay

## 2019-08-03 ENCOUNTER — Ambulatory Visit (HOSPITAL_COMMUNITY)
Admission: EM | Admit: 2019-08-03 | Discharge: 2019-08-03 | Disposition: A | Discharge status: Home / Self Care | Payer: Medicare Other | Attending: Family Medicine | Admitting: Family Medicine

## 2019-08-03 DIAGNOSIS — M8949 Other hypertrophic osteoarthropathy, multiple sites: Secondary | ICD-10-CM | POA: Diagnosis not present

## 2019-08-03 DIAGNOSIS — Z79899 Other long term (current) drug therapy: Secondary | ICD-10-CM | POA: Insufficient documentation

## 2019-08-03 DIAGNOSIS — B370 Candidal stomatitis: Secondary | ICD-10-CM | POA: Diagnosis present

## 2019-08-03 DIAGNOSIS — I509 Heart failure, unspecified: Secondary | ICD-10-CM | POA: Insufficient documentation

## 2019-08-03 DIAGNOSIS — R070 Pain in throat: Secondary | ICD-10-CM

## 2019-08-03 DIAGNOSIS — Z91040 Latex allergy status: Secondary | ICD-10-CM | POA: Insufficient documentation

## 2019-08-03 DIAGNOSIS — Z7952 Long term (current) use of systemic steroids: Secondary | ICD-10-CM | POA: Diagnosis not present

## 2019-08-03 DIAGNOSIS — Z8249 Family history of ischemic heart disease and other diseases of the circulatory system: Secondary | ICD-10-CM | POA: Diagnosis not present

## 2019-08-03 DIAGNOSIS — Z87891 Personal history of nicotine dependence: Secondary | ICD-10-CM | POA: Insufficient documentation

## 2019-08-03 DIAGNOSIS — Z90710 Acquired absence of both cervix and uterus: Secondary | ICD-10-CM | POA: Diagnosis not present

## 2019-08-03 DIAGNOSIS — Z882 Allergy status to sulfonamides status: Secondary | ICD-10-CM | POA: Insufficient documentation

## 2019-08-03 DIAGNOSIS — J029 Acute pharyngitis, unspecified: Secondary | ICD-10-CM | POA: Diagnosis not present

## 2019-08-03 DIAGNOSIS — I482 Chronic atrial fibrillation, unspecified: Secondary | ICD-10-CM | POA: Diagnosis not present

## 2019-08-03 DIAGNOSIS — Z885 Allergy status to narcotic agent status: Secondary | ICD-10-CM | POA: Insufficient documentation

## 2019-08-03 DIAGNOSIS — R5383 Other fatigue: Secondary | ICD-10-CM

## 2019-08-03 DIAGNOSIS — Z20828 Contact with and (suspected) exposure to other viral communicable diseases: Secondary | ICD-10-CM | POA: Insufficient documentation

## 2019-08-03 DIAGNOSIS — G629 Polyneuropathy, unspecified: Secondary | ICD-10-CM | POA: Insufficient documentation

## 2019-08-03 DIAGNOSIS — Z7901 Long term (current) use of anticoagulants: Secondary | ICD-10-CM | POA: Insufficient documentation

## 2019-08-03 LAB — POC SARS CORONAVIRUS 2 AG: SARS Coronavirus 2 Ag: NEGATIVE

## 2019-08-03 LAB — POCT RAPID STREP A: Streptococcus, Group A Screen (Direct): NEGATIVE

## 2019-08-03 LAB — POC SARS CORONAVIRUS 2 AG -  ED: SARS Coronavirus 2 Ag: NEGATIVE

## 2019-08-03 MED ORDER — CLOTRIMAZOLE 10 MG MT TROC: 10.0000 mg | Freq: Every day | OROMUCOSAL | 0 refills | Status: AC

## 2019-08-03 MED ORDER — MAGIC MOUTHWASH W/LIDOCAINE: 5.0000 mL | Freq: Four times a day (QID) | ORAL | 0 refills | Status: DC | PRN

## 2019-08-03 NOTE — Discharge Instructions (Signed)
2nd COVID swab pending. Strep negative  Symptoms concerning for thrush causing pain  Use clotrimazole troche 5 times daily Magic mouth wash 3-4 times daily as needed for discomfort  Follow up if not improving or worsening

## 2019-08-03 NOTE — Telephone Encounter (Signed)
FYI

## 2019-08-03 NOTE — Telephone Encounter (Signed)
Pt c/o sore throat yesterday left earache. Pt stated most of throat pain is located to the left side of throat. Pt c/o headache. Pt with productive cough with "creamy white phlegm." Pt afebrile. Myra, who is pt's caregiver, stated she got pt to eat a cough drop and a popsicle but now pain is so severe she does not want to eat or drink. Asked Myra to look at the back of the pt's throat and she noted pus on pt's tonsils.  Pt has red eyes and feels fatigued.  Care advice given and caregiver verbalized understanding.  Called [facility] and spoke with Izora Gala who stated that pt needs to go to [facility] because no one is allowed in office with sore throat. Advised caregiver to take pt to [facility]. Myra stated that she took pt there and was turned away because "she was older than 83 years old." Called  UC and was told that they see people of all ages. Informed Myra and pt is agreeable to go to [facility].   Reason for Disposition . [1] Pus on tonsils (back of throat) AND [2]  fever AND [3] swollen neck lymph nodes ("glands")  Answer Assessment - Initial Assessment Questions 1. ONSET: "When did the throat start hurting?" (Hours or days ago)      Yesterday  2. SEVERITY: "How bad is the sore throat?" (Scale 1-10; mild, moderate or severe)   - MILD (1-3):  doesn't interfere with eating or normal activities   - MODERATE (4-7): interferes with eating some solids and normal activities   - SEVERE (8-10):  excruciating pain, interferes with most normal activities   - SEVERE DYSPHAGIA: can't swallow liquids, drooling     severe 3. STREP EXPOSURE: "Has there been any exposure to strep within the past week?" If so, ask: "What type of contact occurred?"     no 4.  VIRAL SYMPTOMS: "Are there any symptoms of a cold, such as a runny nose, cough, hoarse voice or red eyes?"      Cough and red eyes 5. FEVER: "Do you have a fever?" If so, ask: "What is your temperature, how was it measured, and when did it  start?"     No fever 6. PUS ON THE TONSILS: "Is there pus on the tonsils in the back of your throat?"     yes 7. OTHER SYMPTOMS: "Do you have any other symptoms?" (e.g., difficulty breathing, headache, rash)     Cough- productive creamy white fatigue earache left ear. 8. PREGNANCY: "Is there any chance you are pregnant?" "When was your last menstrual period?"     n/a  Protocols used: SORE THROAT-A-AH

## 2019-08-03 NOTE — ED Triage Notes (Signed)
Pt presents with complaints of sore throat that started this morning. Caregiver reports seeing pus pockets in the back of her throat.  Denies fever. Reports fatigue.

## 2019-08-03 NOTE — Telephone Encounter (Signed)
Spoke to pt to make sure she was on her way to the UC. Pt stated they are about to leave shortly.

## 2019-08-04 NOTE — Telephone Encounter (Signed)
Called pt spoke with pt daughter states that pt feels a little better and is taking Antibiotics, Advised to call the office with any concerns

## 2019-08-04 NOTE — ED Provider Notes (Signed)
[facility]    CSN: [id number] Arrival date & time: 08/03/19  1706      History   Chief Complaint Chief Complaint  Patient presents with  . Sore Throat    HPI Stacey [name] is a 85 y.o. female history of A. fib on Coumadin, CHF, presenting today for evaluation of a sore throat.  Patient woke up this morning with a sore throat and has had a lot of pain with swallowing.  She has had decreased oral intake due to discomfort.  Pain radiates to her left ear.  She denies any congestion or cough.  Denies any known fevers.  She has felt very fatigued and tired.  Has had some sips of water as well as her Atkins shake.  She presents with her caregiver who saw some white spots in the back of her throat.  Patient has had previous tonsillectomy.  HPI  Past Medical History:  Diagnosis Date  . Atrial fibrillation (HCC)   . Hernia of abdominal cavity   . Kyphosis     Patient Active Problem List   Diagnosis Date Noted  . Stenosis of both subclavian arteries 05/17/2019  . Conductive hearing loss, bilateral 07/03/2018  . Bilateral impacted cerumen 07/03/2018  . Acute swimmer's ear of left side 07/03/2018  . Long term (current) use of anticoagulants [Z79.01] 07/25/2017  . Chronic atrial fibrillation (HCC) 07/11/2017  . Primary osteoarthritis involving multiple joints 07/11/2017  . Pseudogout 07/11/2017  . Neuropathy 07/11/2017  . Lymphedema 07/11/2017  . Kyphosis of cervical region 07/11/2017  . Congestive heart failure (HCC) 07/11/2017    Past Surgical History:  Procedure Laterality Date  . ABDOMINAL HYSTERECTOMY    . TONSILLECTOMY      OB History   No obstetric history on file.      Home Medications    Prior to Admission medications   Medication Sig Start Date End Date Taking? Authorizing Provider  atorvastatin (LIPITOR) 10 MG tablet Take 1 tablet (10 mg total) by mouth daily. 04/09/19   Deeann SaintBanks, Shannon R, MD  clotrimazole (MYCELEX) 10 MG troche Take 1 tablet  (10 mg total) by mouth 5 (five) times daily for 7 days. 08/03/19 08/10/19  T[place]anga [place]way C, PA-C  cyanocobalamin 1000 MCG tablet Take 1,000 mcg by mouth daily.    [provider]  desloratadine (CLARINEX) 5 MG tablet Take 5 mg by mouth daily.    [provider]  fexofenadine (ALLEGRA) 60 MG tablet Take 1 tablet (60 mg total) by mouth daily. 07/15/19   Deeann SaintBanks, Shannon R, MD  gabapentin (NEURONTIN) 300 MG capsule Take 1 capsule (300 mg total) by mouth 3 (three) times daily. 05/20/19   Deeann SaintBanks, Shannon R, MD  magic mouthwash w/lidocaine SOLN Take 5 mLs by mouth 4 (four) times daily as needed for mouth pain. 08/03/19   Shontia Gillooly C, PA-C  meclizine (ANTIVERT) 12.5 MG tablet Take 1 tablet (12.5 mg total) by mouth 3 (three) times daily as needed for dizziness. 09/14/18   Deeann SaintBanks, Shannon R, MD  metoprolol tartrate (LOPRESSOR) 50 MG tablet Take 1 tablet (50 mg total) by mouth 2 (two) times daily. 06/17/19   Deeann SaintBanks, Shannon R, MD  Multiple Vitamin (MULTIVITAMIN) capsule Take 1 capsule by mouth daily.    [provider]  mupirocin ointment (BACTROBAN) 2 % Apply to wound once daily with light dressing. 07/27/19   Freddie BreechGalaway, Jennifer L, DPM  omeprazole (PRILOSEC) 20 MG capsule TAKE 1 CAPSULE TWICE A DAY BEFORE MEALS Patient taking differently:  Take 20 mg by mouth 2 (two) times daily before a meal.  03/16/19   [name], MD  Polyethyl Glycol-Propyl Glycol (SYSTANE FREE OP) Place 1 drop into both eyes 3 (three) times daily.    [provider]  potassium chloride SA (KLOR-CON) 20 MEQ tablet TAKE 1 TABLET DAILY 07/02/19   Lyn Records, MD  predniSONE (DELTASONE) 5 MG tablet Take 1 tablet (5 mg total) by mouth daily with breakfast. 02/05/19   [name], MD  sertraline (ZOLOFT) 100 MG tablet TAKE 1 TABLET DAILY 03/08/19   [name], MD  spironolactone (ALDACTONE) 25 MG tablet TAKE ONE-HALF (1/2) TABLET DAILY 11/13/18   [name], MD  torsemide (DEMADEX) 20 MG  tablet TAKE 2 TABLETS DAILY 10/21/18   Lyn Records, MD  traMADol (ULTRAM) 50 MG tablet Take 2 tablets (100 mg total) by mouth 2 (two) times daily as needed. 05/20/19   [name], MD  VITAMIN D, CHOLECALCIFEROL, PO Take 1,000 Units by mouth daily.     [provider]  warfarin (COUMADIN) 5 MG tablet Take 2.5 mg on Wednesday and Saturday.  5 mg all other days Patient taking differently: Take 2.5-5 mg by mouth See admin instructions. Take 2.5 mg on Wednesday and Saturday.  Then take 5 mg by mouth all other days 06/26/18   [name], MD    Family History Family History  Problem Relation Age of Onset  . Healthy Mother   . Heart failure Father     Social History Social History   Tobacco Use  . Smoking status: Former Games developer  . Smokeless tobacco: Never Used  Substance Use Topics  . Alcohol use: No  . Drug use: No     Allergies   Sulfa antibiotics, Codeine, Latex, Neosporin original [bacitracin-neomycin-polymyxin], and Polysporin [bacitracin-polymyxin b]   Review of Systems Review of Systems  Constitutional: Positive for fatigue. Negative for activity change, appetite change, chills and fever.  HENT: Positive for sore throat. Negative for congestion, ear pain, rhinorrhea, sinus pressure and trouble swallowing.   Eyes: Negative for discharge and redness.  Respiratory: Negative for cough, chest tightness and shortness of breath.   Cardiovascular: Negative for chest pain.  Gastrointestinal: Negative for abdominal pain, diarrhea, nausea and vomiting.  Musculoskeletal: Negative for myalgias.  Skin: Negative for rash.  Neurological: Negative for dizziness, light-headedness and headaches.     Physical Exam Triage Vital Signs ED Triage Vitals  Enc Vitals Group     BP 08/03/19 1800 112/72     Pulse Rate 08/03/19 1800 (!) 111     Resp 08/03/19 1800 18     Temp 08/03/19 1800 100.1 F (37.8 C)     Temp src --      SpO2 08/03/19 1800 94 %     Weight --       Height --      Head Circumference --      Peak Flow --      Pain Score 08/03/19 1758 4     Pain Loc --      Pain Edu? --      Excl. in GC? --    No data found.  Updated Vital Signs BP 112/72   Pulse (!) 111   Temp 100.1 F (37.8 C)   Resp 18   SpO2 94%  Heart rate improved to around 100 prior to discharge, O2 remained at 94-95%.  Visual Acuity Right Eye Distance:   Left Eye  Distance:   Bilateral Distance:    Right Eye Near:   Left Eye Near:    Bilateral Near:     Physical Exam Vitals and nursing note reviewed.  Constitutional:      General: She is not in acute distress.    Appearance: She is well-developed.     Comments: No acute distress  HENT:     Head: Normocephalic and atraumatic.     Ears:     Comments: Bilateral ears without tenderness to palpation of external auricle, tragus and mastoid, EAC's without erythema or swelling, TM's with good bony landmarks and cone of light. Non erythematous.     Nose: Nose normal.     Mouth/Throat:     Comments: Soft palate/tonsillar arch appears erythematous with white patches present, no swelling noted in tonsillar area or on soft palate, no erythema or other white patches noted to tongue, posterior pharynx or buccal mucosa, uvula midline without swelling Eyes:     Conjunctiva/sclera: Conjunctivae normal.  Neck:     Comments: No lymphadenopathy, full active range of motion of neck, no overlying neck swelling or erythema Cardiovascular:     Rate and Rhythm: Regular rhythm. Tachycardia present.     Heart sounds: No murmur.  Pulmonary:     Effort: Pulmonary effort is normal. No respiratory distress.     Breath sounds: Normal breath sounds.  Abdominal:     General: There is no distension.     Palpations: Abdomen is soft.     Tenderness: There is no abdominal tenderness.  Musculoskeletal:        General: Normal range of motion.     Cervical back: Neck supple.  Skin:    General: Skin is warm and dry.  Neurological:      Mental Status: She is alert and oriented to person, place, and time.      UC Treatments / Results  Labs (all labs ordered are listed, but only abnormal results are displayed) Labs Reviewed  CULTURE, GROUP A STREP ([place])  NOVEL CORONAVIRUS, NAA (HOSP ORDER, SEND-OUT TO REF LAB; TAT 18-24 HRS)  POC SARS CORONAVIRUS 2 AG -  ED  POC SARS CORONAVIRUS 2 AG  POCT RAPID STREP A    EKG   Radiology No results found.  Procedures Procedures (including critical care time)  Medications Ordered in UC Medications - No data to display  Initial Impression / Assessment and Plan / UC Course  I have reviewed the triage vital signs and the nursing notes.  Pertinent labs & imaging results that were available during my care of the patient were reviewed by me and considered in my medical decision making (see chart for details).    Rapid Covid negative, strep negative.  Covid PCR pending.  Exam suggestive of thrush over bacterial cause of sore throat.  No swelling noted, no sign of abscess or deep space infection at this time.  Will treat with clotrimazole troches as well as Magic mouthwash.  Continue to monitor closely and follow-up if developing any swelling or neck stiffness.  Push fluids.Discussed strict return precautions. Patient verbalized understanding and is agreeable with plan.  Noted to patient and her caregiver O2 hovering around 94%, advised to continue to monitor breathing and follow-up if developing any cough or shortness of breath/worsening symptoms.  Final Clinical Impressions(s) / UC Diagnoses   Final diagnoses:  Acute pharyngitis, unspecified etiology  Oral thrush     Discharge Instructions     2nd COVID swab pending. Strep negative  Symptoms concerning for thrush causing pain  Use clotrimazole troche 5 times daily Magic mouth wash 3-4 times daily as needed for discomfort  Follow up if not improving or worsening   ED Prescriptions    Medication Sig Dispense Auth.  Provider   clotrimazole (MYCELEX) 10 MG troche Take 1 tablet (10 mg total) by mouth 5 (five) times daily for 7 days. 70 tablet Caeden Foots C, PA-C   magic mouthwash w/lidocaine SOLN Take 5 mLs by mouth 4 (four) times daily as needed for mouth pain. 100 mL Lazara Grieser, [place] C, PA-C     PDMP not reviewed this encounter.   Lew Dawes, [place] 08/04/19 1044

## 2019-08-05 LAB — NOVEL CORONAVIRUS, NAA (HOSP ORDER, SEND-OUT TO REF LAB; TAT 18-24 HRS): SARS-CoV-2, NAA: NOT DETECTED

## 2019-08-06 LAB — CULTURE, GROUP A STREP (THRC)

## 2019-08-08 NOTE — Progress Notes (Signed)
Subjective: Patient presents today with h/o RA and ulceration left 2nd digit for follow up. She is accompanied by her caregiver, Myra. They state Ms. [place] stopped wearing her heel pillows and developed another pressure ulcer of the right foot. Myra states ulcer has been present for about one month. She has been cleaning it with peroxide and wound cleanser. She states there was a small amount of pus, but it resolved. States heel was tender to touch. She denies any fever, chills, night sweats, nausea or vomiting.  [name], MD is her PCP.   Current Outpatient Medications on File Prior to Visit  Medication Sig Dispense Refill  . atorvastatin (LIPITOR) 10 MG tablet Take 1 tablet (10 mg total) by mouth daily. 90 tablet 3  . cyanocobalamin 1000 MCG tablet Take 1,000 mcg by mouth daily.    Marland Kitchen desloratadine (CLARINEX) 5 MG tablet Take 5 mg by mouth daily.    . fexofenadine (ALLEGRA) 60 MG tablet Take 1 tablet (60 mg total) by mouth daily. 30 tablet 5  . gabapentin (NEURONTIN) 300 MG capsule Take 1 capsule (300 mg total) by mouth 3 (three) times daily. 90 capsule 2  . meclizine (ANTIVERT) 12.5 MG tablet Take 1 tablet (12.5 mg total) by mouth 3 (three) times daily as needed for dizziness. 30 tablet 0  . metoprolol tartrate (LOPRESSOR) 50 MG tablet Take 1 tablet (50 mg total) by mouth 2 (two) times daily. 60 tablet 3  . Multiple Vitamin (MULTIVITAMIN) capsule Take 1 capsule by mouth daily.    Marland Kitchen omeprazole (PRILOSEC) 20 MG capsule TAKE 1 CAPSULE TWICE A DAY BEFORE MEALS (Patient taking differently: Take 20 mg by mouth 2 (two) times daily before a meal. ) 90 capsule 1  . Polyethyl Glycol-Propyl Glycol (SYSTANE FREE OP) Place 1 drop into both eyes 3 (three) times daily.    . potassium chloride SA (KLOR-CON) 20 MEQ tablet TAKE 1 TABLET DAILY 90 tablet 1  . predniSONE (DELTASONE) 5 MG tablet Take 1 tablet (5 mg total) by mouth daily with breakfast. 90 tablet 3  . sertraline (ZOLOFT) 100 MG tablet TAKE 1  TABLET DAILY 90 tablet 3  . spironolactone (ALDACTONE) 25 MG tablet TAKE ONE-HALF (1/2) TABLET DAILY 90 tablet 3  . torsemide (DEMADEX) 20 MG tablet TAKE 2 TABLETS DAILY 180 tablet 2  . traMADol (ULTRAM) 50 MG tablet Take 2 tablets (100 mg total) by mouth 2 (two) times daily as needed. 180 tablet 5  . VITAMIN D, CHOLECALCIFEROL, PO Take 1,000 Units by mouth daily.     Marland Kitchen warfarin (COUMADIN) 5 MG tablet Take 2.5 mg on Wednesday and Saturday.  5 mg all other days (Patient taking differently: Take 2.5-5 mg by mouth See admin instructions. Take 2.5 mg on Wednesday and Saturday.  Then take 5 mg by mouth all other days) 100 tablet 3   No current facility-administered medications on file prior to visit.     Allergies  Allergen Reactions  . Sulfa Antibiotics Anaphylaxis  . Codeine Nausea And Vomiting  . Latex Rash  . Neosporin Original [Bacitracin-Neomycin-Polymyxin] Rash  . Polysporin [Bacitracin-Polymyxin B] Rash     Objective: There were no vitals filed for this visit.  Vascular Examination: Capillary refill time to digits <3 seconds.    Dorsalis pedis pulses pulses faintly palpable.  Posterior tibial pulses nonpalpable b/l.  No digital hair b/l.  Skin temperature gradient WNL b/l.  Dermatological Examination: Skin thin, shiny and atrophic b/l.  Toenails left great toe and 1-5 right  foot discolored, thick, dystrophic with subungual debris and pain with palpation to nailbeds due to thickness of nails.  Anonychia 2-5 left foot toe with evidence of permanent total nail avulsion. Nailbed completely epithelialized and intact.  Ulceration located right posterolateral heel: Small eschar measurements carried out today of 1.5 x 1.5 cm.  No periulcerative erythema, no edema, no drainage.  No lymphangitis. No purulence expressed. Flocculence absent.  Malodor absent.  Musculoskeletal: Muscle strength 5/5 to all LE muscle groups b/l.  Rigid hammertoe deformity digits 2-5 b/l. Overlapping  2nd digit left foot.  Neurological: Sensation intact 5/5 b/l with 10 gram monofilament.  Assessment: 1. Painful onychomycosis toenails 1-5 right and left great toe 2. Pressure ulcer right heel, noninfected 3. Rheumatoid arthritis  Plan: 1. Toenails 1-5 right and left great toe were debrided in length and girth without iatrogenic bleeding. 2. Ulcer was cleansed with wound cleanser.Light dressing was applied.  3. Prescription written for Mupirocin Ointment to be applied to right heel once daily. Patient is to apply to Mupirocin Ointment once daily and cover with dressing. 4. She is to resume wearing her heel pillows when in bed. 5. Patient was given written instructions on offloading and dressing changes/aftercare and was instructed to call immediately if any signs or symptoms of infection arise.  6. Patient instructed to report to emergency department with worsening appearance of ulcer/toe/foot, increased pain, foul odor, increased redness, swelling, drainage, fever, chills, nightsweats, nausea, vomiting, increased blood sugar.  7. Patient/POA related understanding. 8. Follow up 3 months. Call if any problems arise. 9. Patient/POA to call should there be a concern in the interim.

## 2019-08-09 ENCOUNTER — Telehealth: Payer: Self-pay

## 2019-08-09 ENCOUNTER — Other Ambulatory Visit: Payer: Self-pay | Admitting: Family Medicine

## 2019-08-09 DIAGNOSIS — J302 Other seasonal allergic rhinitis: Secondary | ICD-10-CM

## 2019-08-09 DIAGNOSIS — I509 Heart failure, unspecified: Secondary | ICD-10-CM

## 2019-08-09 DIAGNOSIS — I482 Chronic atrial fibrillation, unspecified: Secondary | ICD-10-CM

## 2019-08-09 MED ORDER — FEXOFENADINE HCL 60 MG PO TABS: 60.0000 mg | ORAL_TABLET | Freq: Every day | ORAL | 0 refills | Status: DC

## 2019-08-09 MED ORDER — METOPROLOL TARTRATE 50 MG PO TABS: 50.0000 mg | ORAL_TABLET | Freq: Two times a day (BID) | ORAL | 0 refills | Status: DC

## 2019-08-09 MED ORDER — MAGIC MOUTHWASH W/LIDOCAINE: 5.0000 mL | Freq: Three times a day (TID) | ORAL | 0 refills | Status: DC | PRN

## 2019-08-09 NOTE — Telephone Encounter (Signed)
Copied from [place] [id number]. Topic: General - Other >> Aug 09, 2019 11:11 AM Keene Breath wrote: Reason for CRM: Called to inform the doctor that the patient has thrush as diagnosed from the Urgent Care.  Patient's daughter said that the patient probably needs a cortisone shot.  Call to speak with Houston Siren, caregiver, CB# [phone]

## 2019-08-09 NOTE — Telephone Encounter (Signed)
Medication Refill - Medication: metoprolol tartrate (LOPRESSOR) 50 MG tablet fexofenadine (ALLEGRA) 60 MG tablet  Patient would like a 90 day supply of both medications  Preferred Pharmacy (with phone number or street name):  [facility], [place] Phone:  [phone]  Fax:  [phone]       Agent: Please be advised that RX refills may take up to 3 business days. We ask that you follow-up with your pharmacy.

## 2019-08-09 NOTE — Telephone Encounter (Signed)
Cortisone shot not needed for thrush.

## 2019-08-09 NOTE — Telephone Encounter (Signed)
Mouth wash refilled.

## 2019-08-09 NOTE — Telephone Encounter (Signed)
Spoke with pt care giver and pt states that she feels better, pt requests for magic mouthwash states that she was given at the [facility] [place] [facility] but she is out. Please advise

## 2019-08-09 NOTE — Telephone Encounter (Signed)
Please advise 

## 2019-08-10 ENCOUNTER — Other Ambulatory Visit: Payer: Self-pay | Admitting: Family Medicine

## 2019-08-10 DIAGNOSIS — Z7901 Long term (current) use of anticoagulants: Secondary | ICD-10-CM

## 2019-08-10 NOTE — Telephone Encounter (Signed)
Pt last INR was 07/14/2019, ok to refill

## 2019-08-11 NOTE — Telephone Encounter (Signed)
Spoke with pt states that she does not need Rx anymore states that she better now.

## 2019-08-17 ENCOUNTER — Encounter: Payer: Self-pay | Admitting: Family Medicine

## 2019-08-17 ENCOUNTER — Ambulatory Visit (INDEPENDENT_AMBULATORY_CARE_PROVIDER_SITE_OTHER): Payer: Medicare Other | Admitting: General Practice

## 2019-08-17 DIAGNOSIS — I482 Chronic atrial fibrillation, unspecified: Secondary | ICD-10-CM

## 2019-08-17 DIAGNOSIS — Z7901 Long term (current) use of anticoagulants: Secondary | ICD-10-CM

## 2019-08-17 LAB — POCT INR: INR: 2.2 (ref 2.0–3.0)

## 2019-08-17 NOTE — Progress Notes (Signed)
Medical screening examination/treatment/procedure(s) were performed by non-physician practitioner and as supervising physician I was immediately available for consultation/collaboration. I agree with above. Montine Hight, MD   

## 2019-08-17 NOTE — Patient Instructions (Signed)
.  lbpcmh  Continue to take 5 mg daily except 2.5 mg on Wednesdays and Saturdays.  Dosing instructions given to daughter,  [name] [phone].  Re-check in 1 to 2 weeks.  [name] verbalized understanding.

## 2019-08-18 ENCOUNTER — Other Ambulatory Visit: Payer: Self-pay

## 2019-08-18 MED ORDER — MAGIC MOUTHWASH W/LIDOCAINE: 5.0000 mL | Freq: Three times a day (TID) | ORAL | 0 refills | Status: DC | PRN

## 2019-08-26 ENCOUNTER — Other Ambulatory Visit: Payer: Self-pay | Admitting: Family Medicine

## 2019-08-26 DIAGNOSIS — R42 Dizziness and giddiness: Secondary | ICD-10-CM

## 2019-08-26 MED ORDER — MECLIZINE HCL 12.5 MG PO TABS: 12.5000 mg | ORAL_TABLET | Freq: Three times a day (TID) | ORAL | 0 refills | Status: DC | PRN

## 2019-08-26 NOTE — Telephone Encounter (Signed)
Routing to PCP's CMA  

## 2019-09-06 ENCOUNTER — Telehealth: Payer: Self-pay | Admitting: Family Medicine

## 2019-09-06 ENCOUNTER — Encounter: Payer: Self-pay | Admitting: Family Medicine

## 2019-09-06 NOTE — Telephone Encounter (Signed)
Left detailed message for pt to call [facility] for her refills since she has enough refills for this Rx

## 2019-09-06 NOTE — Telephone Encounter (Signed)
[facility] as should not be out of med.

## 2019-09-06 NOTE — Telephone Encounter (Signed)
Pt LOV was 07/15/2019 and last refill was done on 05/20/2019 for 180 with 5 refills, please advise

## 2019-09-06 NOTE — Telephone Encounter (Signed)
Patient needs a refill on Tramadol- pt is completely out. Call [phone]  Patient wants to talk to [facility].  Pharmacy for this time- [facility]

## 2019-09-07 ENCOUNTER — Other Ambulatory Visit: Payer: Self-pay | Admitting: Family Medicine

## 2019-09-07 ENCOUNTER — Encounter: Payer: Self-pay | Admitting: Family Medicine

## 2019-09-07 ENCOUNTER — Other Ambulatory Visit: Payer: Self-pay

## 2019-09-07 MED ORDER — TRAMADOL HCL 50 MG PO TABS: 100.0000 mg | ORAL_TABLET | Freq: Two times a day (BID) | ORAL | 5 refills | Status: DC | PRN

## 2019-09-07 MED ORDER — TRAMADOL HCL 50 MG PO TABS: 100.0000 mg | ORAL_TABLET | Freq: Two times a day (BID) | ORAL | 0 refills | Status: AC | PRN

## 2019-09-07 NOTE — Progress Notes (Signed)
As pt is out of Tramadol a 1 wk supply sent to local pharmacy and another rx sent to [facility].  Pt should be taking med BID prn. Database reviewed.  Abbe Amsterdam, MD

## 2019-09-07 NOTE — Telephone Encounter (Signed)
Pt Rx for Tramadol has been refax both to [facility] and [facility], pt is aware

## 2019-09-21 ENCOUNTER — Ambulatory Visit (INDEPENDENT_AMBULATORY_CARE_PROVIDER_SITE_OTHER): Payer: Medicare Other | Admitting: General Practice

## 2019-09-21 DIAGNOSIS — I482 Chronic atrial fibrillation, unspecified: Secondary | ICD-10-CM

## 2019-09-21 DIAGNOSIS — Z7901 Long term (current) use of anticoagulants: Secondary | ICD-10-CM

## 2019-09-21 LAB — POCT INR: INR: 2.5 (ref 2.0–3.0)

## 2019-09-21 NOTE — Patient Instructions (Addendum)
Pre visit review using our clinic review tool, if applicable. No additional management support is needed unless otherwise documented below in the visit note.  Continue to take 5 mg daily except 2.5 mg on Wednesdays and Saturdays.  Dosing instructions given to daughter,  Janice [phone].  Re-check in 1 to 2 weeks.  Janice verbalized understanding.    

## 2019-09-21 NOTE — Progress Notes (Signed)
Medical screening examination/treatment/procedure(s) were performed by non-physician practitioner and as supervising physician I was immediately available for consultation/collaboration. I agree with above. James John, MD   

## 2019-09-23 ENCOUNTER — Encounter: Payer: Self-pay | Admitting: Family Medicine

## 2019-09-24 ENCOUNTER — Other Ambulatory Visit: Payer: Self-pay

## 2019-09-24 MED ORDER — TORSEMIDE 20 MG PO TABS: 40.0000 mg | ORAL_TABLET | Freq: Every day | ORAL | 2 refills | Status: DC

## 2019-09-24 MED ORDER — GABAPENTIN 300 MG PO CAPS: 300.0000 mg | ORAL_CAPSULE | Freq: Three times a day (TID) | ORAL | 2 refills | Status: DC

## 2019-10-05 ENCOUNTER — Ambulatory Visit (INDEPENDENT_AMBULATORY_CARE_PROVIDER_SITE_OTHER): Payer: Medicare Other | Admitting: General Practice

## 2019-10-05 DIAGNOSIS — Z7901 Long term (current) use of anticoagulants: Secondary | ICD-10-CM | POA: Diagnosis not present

## 2019-10-05 LAB — POCT INR: INR: 2.5 (ref 2.0–3.0)

## 2019-10-05 NOTE — Progress Notes (Signed)
Medical screening examination/treatment/procedure(s) were performed by non-physician practitioner and as supervising physician I was immediately available for consultation/collaboration. I agree with above. Shem Plemmons, MD   

## 2019-10-05 NOTE — Patient Instructions (Addendum)
Pre visit review using our clinic review tool, if applicable. No additional management support is needed unless otherwise documented below in the visit note.  Continue to take 5 mg daily except 2.5 mg on Wednesdays and Saturdays.  Dosing instructions given to daughter,  Janice [phone].  Re-check in 1 to 2 weeks.  Janice verbalized understanding.    

## 2019-10-17 DIAGNOSIS — I4821 Permanent atrial fibrillation: Secondary | ICD-10-CM | POA: Diagnosis not present

## 2019-10-17 DIAGNOSIS — Z7901 Long term (current) use of anticoagulants: Secondary | ICD-10-CM | POA: Diagnosis not present

## 2019-10-19 ENCOUNTER — Ambulatory Visit (INDEPENDENT_AMBULATORY_CARE_PROVIDER_SITE_OTHER): Payer: Medicare Other | Admitting: General Practice

## 2019-10-19 DIAGNOSIS — Z7901 Long term (current) use of anticoagulants: Secondary | ICD-10-CM

## 2019-10-19 DIAGNOSIS — I482 Chronic atrial fibrillation, unspecified: Secondary | ICD-10-CM

## 2019-10-19 LAB — POCT INR: INR: 2 (ref 2.0–3.0)

## 2019-10-19 NOTE — Progress Notes (Signed)
Medical screening examination/treatment/procedure(s) were performed by non-physician practitioner and as supervising physician I was immediately available for consultation/collaboration. I agree with above. Timmothy Baranowski, MD   

## 2019-10-19 NOTE — Patient Instructions (Signed)
Pre visit review using our clinic review tool, if applicable. No additional management support is needed unless otherwise documented below in the visit note.  Continue to take 5 mg daily except 2.5 mg on Wednesdays and Saturdays.  Dosing instructions given to daughter,  Janice [phone].  Re-check in 1 to 2 weeks.  Janice verbalized understanding.    

## 2019-10-26 ENCOUNTER — Other Ambulatory Visit: Payer: Self-pay

## 2019-10-26 ENCOUNTER — Encounter: Payer: Self-pay | Admitting: Podiatry

## 2019-10-26 ENCOUNTER — Ambulatory Visit (INDEPENDENT_AMBULATORY_CARE_PROVIDER_SITE_OTHER): Payer: Medicare Other | Admitting: Podiatry

## 2019-10-26 VITALS — Temp 97.0°F

## 2019-10-26 DIAGNOSIS — L84 Corns and callosities: Secondary | ICD-10-CM

## 2019-10-26 DIAGNOSIS — M79674 Pain in right toe(s): Secondary | ICD-10-CM

## 2019-10-26 DIAGNOSIS — M79675 Pain in left toe(s): Secondary | ICD-10-CM | POA: Diagnosis not present

## 2019-10-26 DIAGNOSIS — I739 Peripheral vascular disease, unspecified: Secondary | ICD-10-CM | POA: Diagnosis not present

## 2019-10-26 DIAGNOSIS — L89619 Pressure ulcer of right heel, unspecified stage: Secondary | ICD-10-CM

## 2019-10-26 DIAGNOSIS — B351 Tinea unguium: Secondary | ICD-10-CM | POA: Diagnosis not present

## 2019-10-26 NOTE — Patient Instructions (Signed)

## 2019-10-28 ENCOUNTER — Other Ambulatory Visit: Payer: Self-pay | Admitting: Family Medicine

## 2019-10-28 DIAGNOSIS — J302 Other seasonal allergic rhinitis: Secondary | ICD-10-CM

## 2019-10-29 MED ORDER — FEXOFENADINE HCL 60 MG PO TABS: 60.0000 mg | ORAL_TABLET | Freq: Every day | ORAL | 0 refills | Status: DC

## 2019-10-31 NOTE — Progress Notes (Signed)
Subjective: Stacey [name] presents today for follow up of follow up right heel pressure ulcer, for at risk foot care. Patient has h/o PAD and callus(es) left hallux and painful mycotic toenails b/l that are difficult to trim. Pain interferes with ambulation. Aggravating factors include wearing enclosed shoe gear. Pain is relieved with periodic professional debridement.   She presents with caregiver, Myra. They voice no new pedal problems on today's visit. She states she hasn't been wearing her heel protectors and has a build up of skin on the heel. It is not painful nor swollen.  Allergies  Allergen Reactions  . Sulfa Antibiotics Anaphylaxis  . Codeine Nausea And Vomiting  . Latex Rash  . Neosporin Original [Bacitracin-Neomycin-Polymyxin] Rash  . Polysporin [Bacitracin-Polymyxin B] Rash     Objective: Vitals:   10/26/19 1416  Temp: (!) 97 F (36.1 C)    Pt 86 y.o. year old Caucasian female WD, WN in NAD. AAO x 3.   Vascular Examination:  Capillary fill time to digits <3 seconds b/l. Faintly palpable DP pulses b/l. Nonpalpable PT pulses b/l. Pedal hair absent b/l Skin temperature gradient within normal limits b/l.  Dermatological Examination: Pedal skin is thin shiny, atrophic bilaterally. No open wounds bilaterally. No interdigital macerations bilaterally. Toenails L hallux, R hallux, R 2nd toe, R 3rd toe, R 4th toe and R 5th toe elongated, dystrophic, thickened, and crumbly with subungual debris and tenderness to dorsal palpation. Anonychia noted L 2nd toe, L 3rd toe, L 4th toe and L 5th toe. Nailbed(s) epithelialized.  Small area of pressure posterolateral heel. No tenderness to palpation. No erythema, no edema, no flocculence. Hyperkeratotic lesion(s) distal left hallux.  No erythema, no edema, no drainage, no flocculence.  Musculoskeletal: Normal muscle strength 5/5 to all lower extremity muscle groups bilaterally, no pain crepitus or joint limitation noted with ROM b/l and  hammertoes noted to the  1-5 bilaterally  Neurological: Protective sensation intact 5/5 intact bilaterally with 10g monofilament b/l  Assessment: 1. Pain due to onychomycosis of toenails of both feet   2. Callus   3. Pressure injury of skin of right heel, unspecified injury stage   4. PAD (peripheral artery disease) (HCC)    Plan: -Toenails 1-5 right and left hallux debrided in length and girth without iatrogenic bleeding with sterile nail nipper and dremel.  -Callus(es) left hallux were debrided without complication or incident. Total number debrided =1. -Gentle filing of right heel lesion without iatrogenic bleeding. -Advised her to resume wearing her heel pillows to prevent breakdown of heels. She agreed to do so. -Patient to continue soft, supportive shoe gear daily. -Patient to report any pedal injuries to medical professional immediately. -Patient/POA to call should there be question/concern in the interim.  Return in about 3 months (around 01/26/2020).

## 2019-11-02 ENCOUNTER — Ambulatory Visit (INDEPENDENT_AMBULATORY_CARE_PROVIDER_SITE_OTHER): Payer: Medicare Other | Admitting: General Practice

## 2019-11-02 DIAGNOSIS — I482 Chronic atrial fibrillation, unspecified: Secondary | ICD-10-CM

## 2019-11-02 DIAGNOSIS — Z7901 Long term (current) use of anticoagulants: Secondary | ICD-10-CM

## 2019-11-02 LAB — POCT INR: INR: 2.3 (ref 2.0–3.0)

## 2019-11-02 NOTE — Progress Notes (Signed)
Medical screening examination/treatment/procedure(s) were performed by non-physician practitioner and as supervising physician I was immediately available for consultation/collaboration. I agree with above. Milferd Ansell, MD   

## 2019-11-02 NOTE — Patient Instructions (Signed)
Pre visit review using our clinic review tool, if applicable. No additional management support is needed unless otherwise documented below in the visit note.  Continue to take 5 mg daily except 2.5 mg on Wednesdays and Saturdays.  Dosing instructions given to daughter,  Janice [phone].  Re-check in 1 to 2 weeks.  Janice verbalized understanding.    

## 2019-11-16 ENCOUNTER — Ambulatory Visit (INDEPENDENT_AMBULATORY_CARE_PROVIDER_SITE_OTHER): Payer: Medicare Other | Admitting: General Practice

## 2019-11-16 DIAGNOSIS — I482 Chronic atrial fibrillation, unspecified: Secondary | ICD-10-CM

## 2019-11-16 DIAGNOSIS — Z7901 Long term (current) use of anticoagulants: Secondary | ICD-10-CM | POA: Diagnosis not present

## 2019-11-16 LAB — POCT INR: INR: 2.1 (ref 2.0–3.0)

## 2019-11-16 NOTE — Progress Notes (Signed)
Medical screening examination/treatment/procedure(s) were performed by non-physician practitioner and as supervising physician I was immediately available for consultation/collaboration. I agree with above. Baldwin Racicot, MD   

## 2019-11-16 NOTE — Patient Instructions (Signed)
Pre visit review using our clinic review tool, if applicable. No additional management support is needed unless otherwise documented below in the visit note.  Continue to take 5 mg daily except 2.5 mg on Wednesdays and Saturdays.  Dosing instructions given to daughter,  Janice [phone].  Re-check in 1 to 2 weeks.  Janice verbalized understanding.    

## 2019-11-17 ENCOUNTER — Ambulatory Visit: Payer: Medicare Other | Admitting: Family Medicine

## 2019-11-17 ENCOUNTER — Other Ambulatory Visit: Payer: Self-pay

## 2019-11-18 ENCOUNTER — Ambulatory Visit (INDEPENDENT_AMBULATORY_CARE_PROVIDER_SITE_OTHER): Payer: Medicare Other | Admitting: Family Medicine

## 2019-11-18 ENCOUNTER — Encounter: Payer: Self-pay | Admitting: Family Medicine

## 2019-11-18 VITALS — BP 98/68 | HR 94 | Temp 97.7°F | Wt 111.6 lb

## 2019-11-18 DIAGNOSIS — G8929 Other chronic pain: Secondary | ICD-10-CM | POA: Diagnosis not present

## 2019-11-18 DIAGNOSIS — R42 Dizziness and giddiness: Secondary | ICD-10-CM | POA: Diagnosis not present

## 2019-11-18 DIAGNOSIS — M25512 Pain in left shoulder: Secondary | ICD-10-CM

## 2019-11-18 DIAGNOSIS — Z7901 Long term (current) use of anticoagulants: Secondary | ICD-10-CM

## 2019-11-18 NOTE — Patient Instructions (Signed)
Managing Pain Without Opioids °Opioids are strong medicines used to treat moderate to severe pain. For some people, especially those who have long-term (chronic) pain, opioids may not be the best choice for pain management due to: °· Side effects like nausea, constipation, and sleepiness. °· The risk of addiction (opioid use disorder). The longer you take opioids, the greater your risk of addiction. °Pain that lasts for more than 3 months is called chronic pain. Managing chronic pain usually requires more than one approach and is often provided by a team of health care providers working together (multidisciplinary approach). Pain management may be done at a pain management center or pain clinic. °Types of pain management without opioids °Managing pain without opioids can involve: °· Non-opioid medicines. °· Exercises to help relieve pain and improve strength and range of motion (physical therapy). °· Therapy to help with everyday tasks and activities (occupational therapy). °· Therapy to help you find ways to relieve pain by doing things you enjoy (recreational therapy). °· Talk therapy (psychotherapy) and other mental health therapies. °· Medical treatments such as injections or devices. °· Making lifestyle changes. °Pain management options °Non-opioid medicines °Non-opioid medicines for pain may include medicines taken by mouth (oral medicines), such as: °· Over-the-counter or prescription NSAIDs. These may be the first medicines used for pain. They work well for muscle and bone pain, and they reduce swelling. °· Acetaminophen. This over-the-counter medicine may work well for milder pain but not swelling. °· Antidepressants. These may be used to treat chronic pain. A certain type of antidepressant (tricyclics) is often used. These medicines are given in lower doses for pain than when used for depression. °· Anticonvulsants. These are usually used to treat seizures but may also reduce nerve (neuropathic)  pain. °· Muscle relaxants. These relieve pain caused by sudden muscle tightening (spasms). °You may also use a type of pain medicine that is applied to the skin as a patch, cream, or gel (topical analgesic), such as a numbing medicine. These may cause fewer side effects than oral medicines. °Therapy °Physical therapy involves doing exercises to gain strength and flexibility. A physical therapist may teach you exercises to move and stretch parts of your body that are weak, stiff, or painful. You can learn these exercises at physical therapy visits and practice them at home. Physical therapy may also involve: °· Massage. °· Heat wraps or applying heat or cold to affected areas. °· Sending electrical signals through the skin to interrupt pain signals (transcutaneous electrical nerve stimulation, TENS). °· Sending weak lasers through the skin to reduce pain and swelling (low-level laser therapy). °· Using signals from your body to help you learn to regulate pain (biofeedback). °Occupational therapy helps you learn ways to function at home and work with less pain. °Recreational therapy may involve trying new activities or hobbies, such as drawing or a physical activity. °Types of mental health therapy for pain include: °· Cognitive behavioral therapy (CBT) to help you learn coping skills for dealing with pain. °· Acceptance and commitment therapy (ACT) to change the way you think and react to pain. °· Relaxation therapies, including muscle relaxation exercises and focusing your mind on the present moment to lower stress (mindfulness-based stress reduction). °· Pain management counseling. This may be individual, family, or group counseling. ° °Medical treatments °Medical treatments for pain management include: °· Nerve block injections. These may include a pain blocker and anti-inflammatory medicines. You may have injections: °? Near the spine to relieve chronic back or neck pain. °?   Into joints to relieve back or joint  pain. ? Into nerve areas that supply a painful area to relieve body pain. ? Into muscles (trigger point injections) to relieve some painful muscle conditions.  A medical device placed near your spine to help block pain signals and relieve nerve pain or chronic back pain (spinal cord stimulation device).  Acupuncture. Follow these instructions at home Medicines  Take over-the-counter and prescription medicines only as told by your health care provider.  If you are taking pain medicine, ask your health care providers about possible side effects to watch out for.  Do not drive or use heavy machinery while taking prescription pain medicine. Lifestyle   Do not use drugs or alcohol to reduce pain. Limit alcohol intake to no more than 1 drink a day for nonpregnant women and 2 drinks a day for men. One drink equals 12 oz of beer, 5 oz of wine, or 1 oz of hard liquor.  Do not use any products that contain nicotine or tobacco, such as cigarettes and e-cigarettes. These can delay healing. If you need help quitting, ask your health care provider.  Eat a healthy diet and maintain a healthy weight. Poor diet and excess weight may make pain worse. ? Eat foods that are high in fiber. These include fresh fruits and vegetables, whole grains, and beans. ? Limit foods that are high in fat and processed sugars, such as fried and sweet foods.  Exercise regularly. Exercise lowers stress and may help relieve pain. ? Ask your health care provider what activities and exercises are safe for you. ? If your health care provider approves, join an exercise class that combines movement and stress reduction. Examples include yoga and tai chi.  Get enough sleep. Lack of sleep may make pain worse.  Lower stress as much as possible. Practice stress reduction techniques as told by your therapist. General instructions  Work with all your pain management providers to find the treatments that work best for you. You are  an important member of your pain management team. There are many things you can do to reduce pain on your own.  Consider joining an online or in-person support group for people who have chronic pain.  Keep all follow-up visits as told by your health care providers. This is important. Where to find more information You can find more information about managing pain without opioids from:  American Academy of Pain Medicine: painmed.[place] for Chronic Pain: instituteforchronicpain.org  American Chronic Pain Association: theacpa.org Contact a health care provider if:  You have side effects from pain medicine.  Your pain gets worse or does not get better with treatments or home care.  You are struggling with anxiety or depression. Summary  Many types of pain can be managed without opioids. Chronic pain may respond better to pain management without opioids.  Pain is best managed with a team of providers working together.  Pain management without opioids may include non-opioid medicines, medical treatments, physical therapy, mental health therapy, and lifestyle changes.  Tell your health care providers if your pain gets worse or is not being managed well enough. This information is not intended to replace advice given to you by your health care provider. Make sure you discuss any questions you have with your health care provider. Document Revised: 04/21/2019 Document Reviewed: 05/20/2017 Elsevier Patient Education  [place]. Dizziness Dizziness is a common problem. It is a feeling of unsteadiness or light-headedness. You may feel like you are  about to faint. Dizziness can lead to injury if you stumble or fall. Anyone can become dizzy, but dizziness is more common in older adults. This condition can be caused by a number of things, including medicines, dehydration, or illness. Follow these instructions at home: Eating and drinking  Drink enough fluid to keep your urine  clear or pale yellow. This helps to keep you from becoming dehydrated. Try to drink more clear fluids, such as water.  Do not drink alcohol.  Limit your caffeine intake if told to do so by your health care provider. Check ingredients and nutrition facts to see if a food or beverage contains caffeine.  Limit your salt (sodium) intake if told to do so by your health care provider. Check ingredients and nutrition facts to see if a food or beverage contains sodium. Activity  Avoid making quick movements. ? Rise slowly from chairs and steady yourself until you feel okay. ? In the morning, first sit up on the side of the bed. When you feel okay, stand slowly while you hold onto something until you know that your balance is fine.  If you need to stand in one place for a long time, move your legs often. Tighten and relax the muscles in your legs while you are standing.  Do not drive or use heavy machinery if you feel dizzy.  Avoid bending down if you feel dizzy. Place items in your home so that they are easy for you to reach without leaning over. Lifestyle  Do not use any products that contain nicotine or tobacco, such as cigarettes and e-cigarettes. If you need help quitting, ask your health care provider.  Try to reduce your stress level by using methods such as yoga or meditation. Talk with your health care provider if you need help to manage your stress. General instructions  Watch your dizziness for any changes.  Take over-the-counter and prescription medicines only as told by your health care provider. Talk with your health care provider if you think that your dizziness is caused by a medicine that you are taking.  Tell a friend or a family member that you are feeling dizzy. If he or she notices any changes in your behavior, have this person call your health care provider.  Keep all follow-up visits as told by your health care provider. This is important. Contact a health care provider  if:  Your dizziness does not go away.  Your dizziness or light-headedness gets worse.  You feel nauseous.  You have reduced hearing.  You have new symptoms.  You are unsteady on your feet or you feel like the room is spinning. Get help right away if:  You vomit or have diarrhea and are unable to eat or drink anything.  You have problems talking, walking, swallowing, or using your arms, hands, or legs.  You feel generally weak.  You are not thinking clearly or you have trouble forming sentences. It may take a friend or family member to notice this.  You have chest pain, abdominal pain, shortness of breath, or sweating.  Your vision changes.  You have any bleeding.  You have a severe headache.  You have neck pain or a stiff neck.  You have a fever. These symptoms may represent a serious problem that is an emergency. Do not wait to see if the symptoms will go away. Get medical help right away. Call your local emergency services (911 in the [place] Do not drive yourself to the hospital. Summary  Dizziness is a feeling of unsteadiness or light-headedness. This condition can be caused by a number of things, including medicines, dehydration, or illness. °· Anyone can become dizzy, but dizziness is more common in older adults. °· Drink enough fluid to keep your urine clear or pale yellow. Do not drink alcohol. °· Avoid making quick movements if you feel dizzy. Monitor your dizziness for any changes. °This information is not intended to replace advice given to you by your health care provider. Make sure you discuss any questions you have with your health care provider. °Document Revised: 08/01/2017 Document Reviewed: 08/31/2016 °Elsevier Patient Education © 2020 Elsevier Inc. ° °

## 2019-11-18 NOTE — Progress Notes (Signed)
Subjective:    Patient ID: Stacey [name], female    DOB: 1933-03-15, 86 y.o.   MRN: [id number]  No chief complaint on file. Pt accompanied by her caregiver.  HPI Patient was seen today for f/u.  Pt notes occasional episodes of dizziness.  Has rx for meclizine, but at times is unable to swallow the pill when the dizziness starts.  Pt states the dizziness starts suddenly wihtout warning.  Last episode occurred while sitting up in bed.  Not drinking much water daily.  Pt with chronic L shoulder pain 2/2 OA.  Pt taking Tramadol, using heat.   Past Medical History:  Diagnosis Date  . Atrial fibrillation (HCC)   . Hernia of abdominal cavity   . Kyphosis     Allergies  Allergen Reactions  . Sulfa Antibiotics Anaphylaxis  . Codeine Nausea And Vomiting  . Latex Rash  . Neosporin Original [Bacitracin-Neomycin-Polymyxin] Rash  . Polysporin [Bacitracin-Polymyxin B] Rash    ROS General: Denies fever, chills, night sweats, changes in weight, changes in appetite HEENT: Denies headaches, ear pain, changes in vision, rhinorrhea, sore throat +intermittent dizziness CV: Denies CP, palpitations, SOB, orthopnea Pulm: Denies SOB, cough, wheezing GI: Denies abdominal pain, nausea, vomiting, diarrhea, constipation GU: Denies dysuria, hematuria, frequency, vaginal discharge Msk: Denies muscle cramps  + chronic shoulder pain Neuro: Denies weakness, numbness, tingling Skin: Denies rashes, bruising Psych: Denies depression, anxiety, hallucinations     Objective:    Blood pressure 98/68, pulse 94, temperature 97.7 F (36.5 C), temperature source Temporal, weight 111 lb 9.6 oz (50.6 kg), SpO2 96 %.  Gen. Pleasant, well-nourished, in no distress, normal affect   HEENT: [place]/AT, face symmetric, no scleral icterus, PERRLA, EOMI, nares patent without drainage.  TMs normal b/l. Lungs: no accessory muscle use, CTAB, no wheezes or rales Cardiovascular: No m/r/g, no peripheral edema Musculoskeletal:  kyphosis.  Limited ROM of L shoulder and arm.  No cyanosis or clubbing, normal tone Neuro:  A&Ox3, CN II-XII intact, gait not assessed sitting in a wheelchair. Skin:  Warm, no lesions/ rash   Wt Readings from Last 3 Encounters:  07/15/19 112 lb (50.8 kg)  06/17/19 109 lb 6.4 oz (49.6 kg)  05/13/19 105 lb (47.6 kg)    Lab Results  Component Value Date   WBC 6.0 05/21/2019   HGB 12.9 05/21/2019   HCT 41.1 05/21/2019   PLT 228 05/21/2019   GLUCOSE 100 (H) 05/21/2019   ALT 19 05/21/2019   AST 28 05/21/2019   NA 133 (L) 05/21/2019   K 3.5 05/21/2019   CL 94 (L) 05/21/2019   CREATININE 0.85 05/21/2019   BUN 12 05/21/2019   CO2 26 05/21/2019   INR 2.1 11/16/2019    Assessment/Plan:  Vertigo -discussed possible causes.  Likely multifactorial 2/2 dehydration and medications. -increase po intake of water daily -use Meclizine 12.5 mg if needed. -continue to monitor  Chronic left shoulder pain -continue supportive care -Will continue Tramadol BID prn, however pt taking scheduled.  Avoid increasing frequency as in the past caused somnolence. -consider OTC analgesic rubs/creams -offered PT.  Pt declined. -f/u with Ortho  Long term use of anticoagulants -h/o chronic afib.  Previously on Xarelto, d/c'd in 2014 2/2 GIB  -continue coumadin 2.5 mg on Wed and Sat, 5 mg on all other days. -INR goal 2.0-3.0 -continue home INR  F/u in 3-4 months  Abbe [place], MD

## 2019-11-20 ENCOUNTER — Encounter: Payer: Self-pay | Admitting: Family Medicine

## 2019-11-20 DIAGNOSIS — I739 Peripheral vascular disease, unspecified: Secondary | ICD-10-CM | POA: Insufficient documentation

## 2019-11-20 DIAGNOSIS — M8580 Other specified disorders of bone density and structure, unspecified site: Secondary | ICD-10-CM | POA: Insufficient documentation

## 2019-11-23 NOTE — Progress Notes (Signed)
Cardiology Office Note:    Date:  11/24/2019   ID:  Stacey [name], [place] 03/11/1933, MRN [id number]  PCP:  [name], MD  Cardiologist:  Lesleigh Noe, MD   Referring MD: [name], MD   Chief Complaint  Patient presents with  . Congestive Heart Failure  . Atrial Fibrillation    History of Present Illness:    Stacey [name] is a 86 y.o. female with a hx of persistent AF, chronic anticoagulation (Xarelto --> GI bleeding--> Coumadin) chronic diastolic HF,  and bilateral subclavian stenosis,  Stacey [name] is accompanied by her daughter.  She has a history of chronic atrial for ablation, chronic diastolic heart failure, scoliosis, and prior history of bleeding on DOAC therapy.  She has no specific cardiac complaints.  She is not short of breath when recumbent.  There is no peripheral edema.  Past Medical History:  Diagnosis Date  . Atrial fibrillation (HCC)   . Hernia of abdominal cavity   . Kyphosis     Past Surgical History:  Procedure Laterality Date  . ABDOMINAL HYSTERECTOMY    . TONSILLECTOMY      Current Medications: Current Meds  Medication Sig  . atorvastatin (LIPITOR) 10 MG tablet Take 1 tablet (10 mg total) by mouth daily.  . cyanocobalamin 1000 MCG tablet Take 1,000 mcg by mouth daily.  . fexofenadine (ALLEGRA) 60 MG tablet Take 1 tablet (60 mg total) by mouth daily.  Marland Kitchen gabapentin (NEURONTIN) 300 MG capsule Take 300 mg by mouth daily.  . meclizine (ANTIVERT) 12.5 MG tablet Take 1 tablet (12.5 mg total) by mouth 3 (three) times daily as needed for dizziness.  . metoprolol tartrate (LOPRESSOR) 50 MG tablet Take 1 tablet (50 mg total) by mouth 2 (two) times daily.  . Multiple Vitamin (MULTIVITAMIN) capsule Take 1 capsule by mouth daily.  . mupirocin ointment (BACTROBAN) 2 % Apply to wound once daily with light dressing.  Marland Kitchen omeprazole (PRILOSEC) 20 MG capsule TAKE 1 CAPSULE TWICE A DAY BEFORE MEALS  . potassium chloride SA (KLOR-CON) 20 MEQ  tablet TAKE 1 TABLET DAILY  . predniSONE (DELTASONE) 5 MG tablet Take 1 tablet (5 mg total) by mouth daily with breakfast.  . sertraline (ZOLOFT) 100 MG tablet TAKE 1 TABLET DAILY  . spironolactone (ALDACTONE) 25 MG tablet TAKE ONE-HALF (1/2) TABLET DAILY  . torsemide (DEMADEX) 20 MG tablet Take 2 tablets (40 mg total) by mouth daily.  . traMADol (ULTRAM) 50 MG tablet Take 2 tablets (100 mg total) by mouth 2 (two) times daily as needed.  Marland Kitchen VITAMIN D, CHOLECALCIFEROL, PO Take 1,000 Units by mouth daily.   Marland Kitchen warfarin (COUMADIN) 5 MG tablet TAKE ONE-HALF (1/2) TABLET (2.5 MG) ON WEDNESDAY AND SATURDAY. 1 TABLET (5 MG) ALL OTHER DAYS     Allergies:   Sulfa antibiotics, Codeine, Latex, Neosporin original [bacitracin-neomycin-polymyxin], and Polysporin [bacitracin-polymyxin b]   Social History   Socioeconomic History  . Marital status: Widowed    Spouse name: Not on file  . Number of children: Not on file  . Years of education: Not on file  . Highest education level: Not on file  Occupational History  . Not on file  Tobacco Use  . Smoking status: Former Games developer  . Smokeless tobacco: Never Used  Substance and Sexual Activity  . Alcohol use: No  . Drug use: No  . Sexual activity: Not on file  Other Topics Concern  . Not on file  Social History Narrative  .  Not on file   Social Determinants of Health   Financial Resource Strain:   . Difficulty of Paying Living Expenses:   Food Insecurity:   . Worried About Programme researcher, broadcasting/film/video in the Last Year:   . Barista in the Last Year:   Transportation Needs:   . Freight forwarder (Medical):   Marland Kitchen Lack of Transportation (Non-Medical):   Physical Activity:   . Days of Exercise per Week:   . Minutes of Exercise per Session:   Stress:   . Feeling of Stress :   Social Connections:   . Frequency of Communication with Friends and Family:   . Frequency of Social Gatherings with Friends and Family:   . Attends Religious Services:   .  Active Member of Clubs or Organizations:   . Attends Banker Meetings:   Marland Kitchen Marital Status:      Family History: The patient's family history includes Healthy in her mother; Heart failure in her father.  ROS:   Please see the history of present illness.    She has no specific cardiac complaints.  She has not had syncope.  She has not felt palpitations.  She was inquisitive about the possibility of future episodes of heart failure.  She also wanted to understand what her diagnosis is and risk of recurrence.  All other systems reviewed and are negative.  EKGs/Labs/Other Studies Reviewed:    The following studies were reviewed today: No new data  EKG:  EKG not repeated.  Last tracing was performed May 24, 2019 and demonstrated atrial fib, controlled rate, vertical axis.  Recent Labs: 05/21/2019: ALT 19; BUN 12; Creatinine, Ser 0.85; Hemoglobin 12.9; Platelets 228; Potassium 3.5; Sodium 133  Recent Lipid Panel No results found for: CHOL, TRIG, HDL, CHOLHDL, VLDL, LDLCALC, LDLDIRECT  Physical Exam:    VS:  BP 106/72   Pulse 70   Ht 5\' 3"  (1.6 m)   Wt 111 lb 12.8 oz (50.7 kg)   SpO2 97%   BMI 19.80 kg/m     Wt Readings from Last 3 Encounters:  11/24/19 111 lb 12.8 oz (50.7 kg)  11/18/19 111 lb 9.6 oz (50.6 kg)  07/15/19 112 lb (50.8 kg)     GEN: Obvious kyphosis/scoliosis. No acute distress HEENT: Normal NECK: No JVD. LYMPHATICS: No lymphadenopathy CARDIAC: Irregularly irregular RR without murmur, gallop, or edema. VASCULAR: Normal Pulses. No bruits. RESPIRATORY:  Clear to auscultation without rales, wheezing or rhonchi  ABDOMEN: Soft, non-tender, non-distended, No pulsatile mass, MUSCULOSKELETAL: No deformity  SKIN: Warm and dry NEUROLOGIC:  Alert and oriented x 3 PSYCHIATRIC:  Normal affect   ASSESSMENT:    1. Chronic atrial fibrillation (HCC)   2. Long term (current) use of anticoagulants [Z79.01]   3. Congestive heart failure, unspecified HF  chronicity, unspecified heart failure type (HCC)   4. Stenosis of both subclavian arteries   5. Educated about COVID-19 virus infection    PLAN:    In order of problems listed above:  1. Controlled rate.  Asymptomatic.  Continue current therapy.  For rate control includes 50 mg twice daily of metoprolol tartrate.  Continue same dose. 2. Coumadin therapy without bleeding.  CHADS Vas score greater than 2. 3. Diastolic heart failure occurred while in A. fib with rapid rate.  No evidence of volume overload or active heart failure currently. 4. Asymptomatic.  Primary prevention discussed.  Statin therapy to maintain adequate LDL, blood pressure less than 140/80 mmHg, physical activity as  tolerated. 5. COVID-19 vaccine, mask wearing, and social distancing are being employed.   Medication Adjustments/Labs and Tests Ordered: Current medicines are reviewed at length with the patient today.  Concerns regarding medicines are outlined above.  Orders Placed This Encounter  Procedures  . Basic metabolic panel  . CBC   No orders of the defined types were placed in this encounter.   Patient Instructions  Medication Instructions:  Your physician recommends that you continue on your current medications as directed. Please refer to the Current Medication list given to you today.  *If you need a refill on your cardiac medications before your next appointment, please call your pharmacy*   Lab Work: BMET and CBC today  If you have labs (blood work) drawn today and your tests are completely normal, you will receive your results only by: Marland Kitchen MyChart Message (if you have MyChart) OR . A paper copy in the mail If you have any lab test that is abnormal or we need to change your treatment, we will call you to review the results.   Testing/Procedures: None   Follow-Up: At [facility], you and your health needs are our priority.  As part of our continuing mission to provide you with exceptional  heart care, we have created designated Provider Care Teams.  These Care Teams include your primary Cardiologist (physician) and Advanced Practice Providers (APPs -  Physician Assistants and Nurse Practitioners) who all work together to provide you with the care you need, when you need it.  We recommend signing up for the patient portal called "MyChart".  Sign up information is provided on this After Visit Summary.  MyChart is used to connect with patients for Virtual Visits (Telemedicine).  Patients are able to view lab/test results, encounter notes, upcoming appointments, etc.  Non-urgent messages can be sent to your provider as well.   To learn more about what you can do with MyChart, go to [email or web address].    Your next appointment:   12 month(s)  The format for your next appointment:   In Person  Provider:   You may see Sinclair Grooms, MD or one of the following Advanced Practice Providers on your designated Care Team:    Truitt Merle, NP  Cecilie Kicks, NP  Kathyrn Drown, NP    Other Instructions      Signed, Sinclair Grooms, MD  11/24/2019 1:14 PM    [place]

## 2019-11-24 ENCOUNTER — Encounter: Payer: Self-pay | Admitting: Interventional Cardiology

## 2019-11-24 ENCOUNTER — Other Ambulatory Visit: Payer: Self-pay

## 2019-11-24 ENCOUNTER — Ambulatory Visit (INDEPENDENT_AMBULATORY_CARE_PROVIDER_SITE_OTHER): Payer: Medicare Other | Admitting: Interventional Cardiology

## 2019-11-24 VITALS — BP 106/72 | HR 70 | Ht 63.0 in | Wt 111.8 lb

## 2019-11-24 DIAGNOSIS — I771 Stricture of artery: Secondary | ICD-10-CM

## 2019-11-24 DIAGNOSIS — I482 Chronic atrial fibrillation, unspecified: Secondary | ICD-10-CM | POA: Diagnosis not present

## 2019-11-24 DIAGNOSIS — I509 Heart failure, unspecified: Secondary | ICD-10-CM | POA: Diagnosis not present

## 2019-11-24 DIAGNOSIS — Z7189 Other specified counseling: Secondary | ICD-10-CM

## 2019-11-24 DIAGNOSIS — I708 Atherosclerosis of other arteries: Secondary | ICD-10-CM | POA: Diagnosis not present

## 2019-11-24 DIAGNOSIS — Z7901 Long term (current) use of anticoagulants: Secondary | ICD-10-CM | POA: Diagnosis not present

## 2019-11-24 NOTE — Patient Instructions (Signed)
Medication Instructions:  Your physician recommends that you continue on your current medications as directed. Please refer to the Current Medication list given to you today.  *If you need a refill on your cardiac medications before your next appointment, please call your pharmacy*   Lab Work: BMET and CBC today  If you have labs (blood work) drawn today and your tests are completely normal, you will receive your results only by: . MyChart Message (if you have MyChart) OR . A paper copy in the mail If you have any lab test that is abnormal or we need to change your treatment, we will call you to review the results.   Testing/Procedures: None   Follow-Up: At [facility], you and your health needs are our priority.  As part of our continuing mission to provide you with exceptional heart care, we have created designated Provider Care Teams.  These Care Teams include your primary Cardiologist (physician) and Advanced Practice Providers (APPs -  Physician Assistants and Nurse Practitioners) who all work together to provide you with the care you need, when you need it.  We recommend signing up for the patient portal called "MyChart".  Sign up information is provided on this After Visit Summary.  MyChart is used to connect with patients for Virtual Visits (Telemedicine).  Patients are able to view lab/test results, encounter notes, upcoming appointments, etc.  Non-urgent messages can be sent to your provider as well.   To learn more about what you can do with MyChart, go to [email or web address].    Your next appointment:   12 month(s)  The format for your next appointment:   In Person  Provider:   You may see Henry W Smith III, MD or one of the following Advanced Practice Providers on your designated Care Team:    Lori Gerhardt, NP  Laura Ingold, NP  Jill McDaniel, NP    Other Instructions   

## 2019-11-25 LAB — BASIC METABOLIC PANEL
BUN/Creatinine Ratio: 16 (ref 12–28)
BUN: 15 mg/dL (ref 8–27)
CO2: 32 mmol/L — ABNORMAL HIGH (ref 20–29)
Calcium: 9.6 mg/dL (ref 8.7–10.3)
Chloride: 98 mmol/L (ref 96–106)
Creatinine, Ser: 0.95 mg/dL (ref 0.57–1.00)
GFR calc Af Amer: 64 mL/min/{1.73_m2} (ref >59)
GFR calc non Af Amer: 55 mL/min/{1.73_m2} — ABNORMAL LOW (ref >59)
Glucose: 73 mg/dL (ref 65–99)
Potassium: 4.9 mmol/L (ref 3.5–5.2)
Sodium: 140 mmol/L (ref 134–144)

## 2019-11-25 LAB — CBC
Hematocrit: 37.4 % (ref 34.0–46.6)
Hemoglobin: 12.3 g/dL (ref 11.1–15.9)
MCH: 28.8 pg (ref 26.6–33.0)
MCHC: 32.9 g/dL (ref 31.5–35.7)
MCV: 88 fL (ref 79–97)
Platelets: 248 10*3/uL (ref 150–450)
RBC: 4.27 x10E6/uL (ref 3.77–5.28)
RDW: 14 % (ref 11.7–15.4)
WBC: 8.6 10*3/uL (ref 3.4–10.8)

## 2019-11-28 LAB — POCT INR: INR: 2.5 (ref 2.0–3.0)

## 2019-11-30 ENCOUNTER — Ambulatory Visit (INDEPENDENT_AMBULATORY_CARE_PROVIDER_SITE_OTHER): Payer: Medicare Other | Admitting: General Practice

## 2019-11-30 DIAGNOSIS — Z7901 Long term (current) use of anticoagulants: Secondary | ICD-10-CM

## 2019-11-30 NOTE — Patient Instructions (Signed)
Pre visit review using our clinic review tool, if applicable. No additional management support is needed unless otherwise documented below in the visit note.  Continue to take 5 mg daily except 2.5 mg on Wednesdays and Saturdays.  Dosing instructions given to daughter,  Janice [phone].  Re-check in 1 to 2 weeks.  Janice verbalized understanding.    

## 2019-11-30 NOTE — Progress Notes (Signed)
Medical screening examination/treatment/procedure(s) were performed by non-physician practitioner and as supervising physician I was immediately available for consultation/collaboration. I agree with above. Estephany Perot, MD   

## 2019-12-02 DIAGNOSIS — M150 Primary generalized (osteo)arthritis: Secondary | ICD-10-CM | POA: Diagnosis not present

## 2019-12-02 DIAGNOSIS — R5383 Other fatigue: Secondary | ICD-10-CM | POA: Diagnosis not present

## 2019-12-02 DIAGNOSIS — Z6820 Body mass index (BMI) 20.0-20.9, adult: Secondary | ICD-10-CM | POA: Diagnosis not present

## 2019-12-02 DIAGNOSIS — M154 Erosive (osteo)arthritis: Secondary | ICD-10-CM | POA: Diagnosis not present

## 2019-12-02 DIAGNOSIS — Z7952 Long term (current) use of systemic steroids: Secondary | ICD-10-CM | POA: Diagnosis not present

## 2019-12-02 DIAGNOSIS — M1120 Other chondrocalcinosis, unspecified site: Secondary | ICD-10-CM | POA: Diagnosis not present

## 2019-12-02 DIAGNOSIS — M2550 Pain in unspecified joint: Secondary | ICD-10-CM | POA: Diagnosis not present

## 2019-12-07 DIAGNOSIS — H6123 Impacted cerumen, bilateral: Secondary | ICD-10-CM | POA: Diagnosis not present

## 2019-12-10 ENCOUNTER — Telehealth: Payer: Self-pay | Admitting: Family Medicine

## 2019-12-10 NOTE — Telephone Encounter (Signed)
Stacey [name] pt care giver called to request a hospital bed for pt. She will be with pt today.   Patient Phone: [phone]

## 2019-12-13 ENCOUNTER — Other Ambulatory Visit: Payer: Self-pay

## 2019-12-13 ENCOUNTER — Telehealth: Payer: Self-pay

## 2019-12-13 DIAGNOSIS — M8949 Other hypertrophic osteoarthropathy, multiple sites: Secondary | ICD-10-CM

## 2019-12-13 DIAGNOSIS — Z9181 History of falling: Secondary | ICD-10-CM

## 2019-12-13 DIAGNOSIS — Z7901 Long term (current) use of anticoagulants: Secondary | ICD-10-CM | POA: Diagnosis not present

## 2019-12-13 DIAGNOSIS — I4821 Permanent atrial fibrillation: Secondary | ICD-10-CM | POA: Diagnosis not present

## 2019-12-13 DIAGNOSIS — M159 Polyosteoarthritis, unspecified: Secondary | ICD-10-CM

## 2019-12-13 NOTE — Telephone Encounter (Signed)
Hospital bed order sent to [facility]

## 2019-12-13 NOTE — Telephone Encounter (Signed)
Pt order sent to [facility] for processing

## 2019-12-14 ENCOUNTER — Ambulatory Visit (INDEPENDENT_AMBULATORY_CARE_PROVIDER_SITE_OTHER): Payer: Medicare Other | Admitting: General Practice

## 2019-12-14 DIAGNOSIS — Z7901 Long term (current) use of anticoagulants: Secondary | ICD-10-CM

## 2019-12-14 DIAGNOSIS — I482 Chronic atrial fibrillation, unspecified: Secondary | ICD-10-CM

## 2019-12-14 LAB — POCT INR: INR: 3.1 — AB (ref 2.0–3.0)

## 2019-12-14 NOTE — Progress Notes (Signed)
Medical screening examination/treatment/procedure(s) were performed by non-physician practitioner and as supervising physician I was immediately available for consultation/collaboration. I agree with above. Evah Rashid, MD   

## 2019-12-14 NOTE — Patient Instructions (Signed)
Pre visit review using our clinic review tool, if applicable. No additional management support is needed unless otherwise documented below in the visit note.  Take 2.5 mg today and then continue to take 5 mg daily except 2.5 mg on Wednesdays and Saturdays.  Dosing instructions given to daughter,  [name] [phone].  Re-check in 1 to 2 weeks.  [name] verbalized understanding.

## 2019-12-15 NOTE — Telephone Encounter (Signed)
Spoke with pt caregiver regarding pt orders for hospital bed, state that [employer] [facility] called pt and stated that they were waiting for insurance approval and would call pt back to let her know what's next

## 2019-12-20 ENCOUNTER — Telehealth: Payer: Self-pay | Admitting: Family Medicine

## 2019-12-20 NOTE — Telephone Encounter (Signed)
Pt office notes faxed to [employer] [facility] for processing of pt Hospital Bed order 

## 2019-12-20 NOTE — Telephone Encounter (Signed)
[facility]ino Dike is calling stating that they need to know why the pt is needing the hospital bed and they will be faxing over the exact reason for them needing the information.  [employer] D[facility] stated that they will decline the order until they get the information that they are needing but if they receive it they will continue on with the process.  Victor[facility] Dike stated please be on the look out for the fax.

## 2019-12-20 NOTE — Telephone Encounter (Signed)
Pt calling to follow up on the hospital bed. Per pt, the company [employer] has called and fax over paperwork and no one is responding. Please call pt at [phone]. Thanks

## 2019-12-20 NOTE — Telephone Encounter (Signed)
Pt office notes faxed to [facility] for processing of pt Hospital Bed order

## 2019-12-21 NOTE — Telephone Encounter (Signed)
Spoke with pt daughter advised that pt LOV note was sent to [facility] for processing of pt hospital bed, pt is  aware that the office will contact her with further information

## 2019-12-29 ENCOUNTER — Other Ambulatory Visit: Payer: Self-pay | Admitting: Interventional Cardiology

## 2019-12-29 DIAGNOSIS — H903 Sensorineural hearing loss, bilateral: Secondary | ICD-10-CM | POA: Diagnosis not present

## 2019-12-30 ENCOUNTER — Ambulatory Visit (INDEPENDENT_AMBULATORY_CARE_PROVIDER_SITE_OTHER): Payer: Medicare Other | Admitting: General Practice

## 2019-12-30 DIAGNOSIS — I482 Chronic atrial fibrillation, unspecified: Secondary | ICD-10-CM

## 2019-12-30 DIAGNOSIS — Z7901 Long term (current) use of anticoagulants: Secondary | ICD-10-CM | POA: Diagnosis not present

## 2019-12-30 LAB — POCT INR: INR: 2.8 (ref 2.0–3.0)

## 2019-12-30 NOTE — Progress Notes (Signed)
Medical screening examination/treatment/procedure(s) were performed by non-physician practitioner and as supervising physician I was immediately available for consultation/collaboration. I agree with above. Dathan Attia, MD   

## 2019-12-30 NOTE — Patient Instructions (Signed)
Pre visit review using our clinic review tool, if applicable. No additional management support is needed unless otherwise documented below in the visit note.  Continue to take 5 mg daily except 2.5 mg on Wednesdays and Saturdays.  Dosing instructions given to daughter,  Janice [phone].  Re-check in 1 to 2 weeks.  Janice verbalized understanding.    

## 2020-01-16 ENCOUNTER — Other Ambulatory Visit: Payer: Self-pay | Admitting: Family Medicine

## 2020-01-25 ENCOUNTER — Ambulatory Visit (INDEPENDENT_AMBULATORY_CARE_PROVIDER_SITE_OTHER): Payer: Medicare Other | Admitting: General Practice

## 2020-01-25 DIAGNOSIS — Z7901 Long term (current) use of anticoagulants: Secondary | ICD-10-CM | POA: Diagnosis not present

## 2020-01-25 LAB — POCT INR: INR: 2.3 (ref 2.0–3.0)

## 2020-01-25 NOTE — Progress Notes (Signed)
Medical screening examination/treatment/procedure(s) were performed by non-physician practitioner and as supervising physician I was immediately available for consultation/collaboration. I agree with above. Srihari Shellhammer, MD   

## 2020-01-25 NOTE — Patient Instructions (Signed)
Pre visit review using our clinic review tool, if applicable. No additional management support is needed unless otherwise documented below in the visit note.  Continue to take 5 mg daily except 2.5 mg on Wednesdays and Saturdays.  Dosing instructions given to daughter,  Janice [phone].  Re-check in 1 to 2 weeks.  Janice verbalized understanding.    

## 2020-01-26 ENCOUNTER — Ambulatory Visit (INDEPENDENT_AMBULATORY_CARE_PROVIDER_SITE_OTHER): Payer: Medicare Other | Admitting: Podiatry

## 2020-01-26 ENCOUNTER — Other Ambulatory Visit: Payer: Self-pay

## 2020-01-26 DIAGNOSIS — B351 Tinea unguium: Secondary | ICD-10-CM | POA: Diagnosis not present

## 2020-01-26 DIAGNOSIS — L84 Corns and callosities: Secondary | ICD-10-CM

## 2020-01-26 DIAGNOSIS — M79674 Pain in right toe(s): Secondary | ICD-10-CM | POA: Diagnosis not present

## 2020-01-26 DIAGNOSIS — I739 Peripheral vascular disease, unspecified: Secondary | ICD-10-CM

## 2020-01-26 DIAGNOSIS — M79675 Pain in left toe(s): Secondary | ICD-10-CM

## 2020-01-26 NOTE — Patient Instructions (Signed)

## 2020-02-03 ENCOUNTER — Telehealth: Payer: Self-pay | Admitting: Family Medicine

## 2020-02-03 NOTE — Telephone Encounter (Signed)
Ok for refill? LOV 11/2019  Last refill info:    traMADol (ULTRAM) 50 MG tablet 120 tablet 5 09/07/2019    Sig - Route: Take 2 tablets (100 mg total) by mouth 2 (two) times daily as needed. - Oral   Sent to pharmacy as: traMADol (ULTRAM) 50 MG tablet   E-Prescribing Status: Receipt confirmed by pharmacy (09/07/2019  2:16 PM EST)

## 2020-02-03 NOTE — Telephone Encounter (Signed)
Message routed to PCP.

## 2020-02-03 NOTE — Telephone Encounter (Signed)
Medication Refill:  Tramadol   [facility] - [place], [place] - [address] Phone:  [phone]  Fax:  [phone]

## 2020-02-05 ENCOUNTER — Encounter: Payer: Self-pay | Admitting: Podiatry

## 2020-02-05 NOTE — Progress Notes (Signed)
Subjective: Stacey [name] is a pleasant 86 y.o. female patient seen today for at risk foot care. Patient has h/o PAD and painful callus(es) b/l feet and painful mycotic toenails b/l that are difficult to trim. Pain interferes with ambulation. Aggravating factors include wearing enclosed shoe gear. Pain is relieved with periodic professional debridement.   Her granddaughter, Herbert Seta, is present during today's visit. Her caregiver, Hollie Salk, is out for health reasons.   Past Medical History:  Diagnosis Date  . Atrial fibrillation (HCC)   . Hernia of abdominal cavity   . Kyphosis     Patient Active Problem List   Diagnosis Date Noted  . PVD (peripheral vascular disease) (HCC) 11/20/2019  . Osteopenia 11/20/2019  . Stenosis of both subclavian arteries 05/17/2019  . Conductive hearing loss, bilateral 07/03/2018  . Bilateral impacted cerumen 07/03/2018  . Acute swimmer's ear of left side 07/03/2018  . Long term (current) use of anticoagulants [Z79.01] 07/25/2017  . Chronic atrial fibrillation (HCC) 07/11/2017  . Primary osteoarthritis involving multiple joints 07/11/2017  . Pseudogout 07/11/2017  . Neuropathy 07/11/2017  . Lymphedema 07/11/2017  . Kyphosis of cervical region 07/11/2017  . Congestive heart failure (HCC) 07/11/2017    Current Outpatient Medications on File Prior to Visit  Medication Sig Dispense Refill  . atorvastatin (LIPITOR) 10 MG tablet Take 1 tablet (10 mg total) by mouth daily. 90 tablet 3  . cyanocobalamin 1000 MCG tablet Take 1,000 mcg by mouth daily.    . fexofenadine (ALLEGRA) 60 MG tablet Take 1 tablet (60 mg total) by mouth daily. 90 tablet 0  . gabapentin (NEURONTIN) 300 MG capsule Take 300 mg by mouth daily.    . meclizine (ANTIVERT) 12.5 MG tablet Take 1 tablet (12.5 mg total) by mouth 3 (three) times daily as needed for dizziness. 30 tablet 0  . metoprolol tartrate (LOPRESSOR) 50 MG tablet Take 1 tablet (50 mg total) by mouth 2 (two) times daily. 180  tablet 0  . Multiple Vitamin (MULTIVITAMIN) capsule Take 1 capsule by mouth daily.    . mupirocin ointment (BACTROBAN) 2 % Apply to wound once daily with light dressing. 30 g 1  . omeprazole (PRILOSEC) 20 MG capsule TAKE 1 CAPSULE TWICE A DAY BEFORE MEALS 90 capsule 1  . potassium chloride SA (KLOR-CON) 20 MEQ tablet TAKE 1 TABLET DAILY 90 tablet 3  . predniSONE (DELTASONE) 5 MG tablet Take 1 tablet (5 mg total) by mouth daily with breakfast. 90 tablet 3  . sertraline (ZOLOFT) 100 MG tablet TAKE 1 TABLET DAILY 90 tablet 3  . spironolactone (ALDACTONE) 25 MG tablet TAKE ONE-HALF (1/2) TABLET DAILY 90 tablet 3  . torsemide (DEMADEX) 20 MG tablet Take 2 tablets (40 mg total) by mouth daily. 180 tablet 2  . traMADol (ULTRAM) 50 MG tablet Take 2 tablets (100 mg total) by mouth 2 (two) times daily as needed. 120 tablet 5  . VITAMIN D, CHOLECALCIFEROL, PO Take 1,000 Units by mouth daily.     Marland Kitchen warfarin (COUMADIN) 5 MG tablet TAKE ONE-HALF (1/2) TABLET (2.5 MG) ON WEDNESDAY AND SATURDAY. 1 TABLET (5 MG) ALL OTHER DAYS 100 tablet 3   No current facility-administered medications on file prior to visit.    Allergies  Allergen Reactions  . Sulfa Antibiotics Anaphylaxis  . Codeine Nausea And Vomiting  . Latex Rash  . Neosporin Original [Bacitracin-Neomycin-Polymyxin] Rash  . Polysporin [Bacitracin-Polymyxin B] Rash    Objective: Physical Exam  General: Stacey [name] is a pleasant 86 y.o. Caucasian female,  frail, in NAD. AAO x 3.   Vascular:  Neurovascular status unchanged b/l lower extremities. Capillary fill time to digits <3 seconds b/l lower extremities. Faintly palpable DP pulse(s) b/l lower extremities. Nonpalpable PT pulse(s) b/l lower extremities. Pedal hair absent. Lower extremity skin temperature gradient warm to cool.  Dermatological:  Pedal skin is thin shiny, atrophic b/l lower extremities. No open wounds bilaterally. No interdigital macerations bilaterally. Toenails 1-5 right  and L hallux elongated, discolored, dystrophic, thickened, and crumbly with subungual debris and tenderness to dorsal palpation. Anonychia noted L 2nd toe, L 3rd toe, L 4th toe and L 5th toe. Nailbed(s) epithelialized.  Hyperkeratotic lesion(s) L hallux.  No erythema, no edema, no drainage, no flocculence.  Musculoskeletal:  Normal muscle strength 5/5 to all lower extremity muscle groups bilaterally. No pain crepitus or joint limitation noted with ROM b/l. Hammertoes noted to the 1-5 bilaterally.  Neurological:  Protective sensation intact 5/5 intact bilaterally with 10g monofilament b/l. Vibratory sensation intact b/l. Proprioception intact bilaterally.  Assessment and Plan:  1. Pain due to onychomycosis of toenails of both feet   2. Callus   3. PAD (peripheral artery disease) (HCC)    -Examined patient. -Toenails 1-5 right and L hallux debrided in length and girth without iatrogenic bleeding with sterile nail nipper and dremel.  -Callus(es) L hallux pared utilizing sterile scalpel blade without complication or incident. Total number debrided =1. -Continue daily padding of digits to prevent pressure ulcers due to severe hammertoe deformity. -Patient to report any pedal injuries to medical professional immediately. -Patient to continue soft, supportive shoe gear daily. -Patient/POA to call should there be question/concern in the interim.  Return in about 3 months (around 04/27/2020) for nail and callus trim.  Marzetta Board, DPM

## 2020-02-10 ENCOUNTER — Ambulatory Visit (INDEPENDENT_AMBULATORY_CARE_PROVIDER_SITE_OTHER): Payer: Medicare Other | Admitting: General Practice

## 2020-02-10 ENCOUNTER — Other Ambulatory Visit: Payer: Self-pay | Admitting: Family Medicine

## 2020-02-10 DIAGNOSIS — I482 Chronic atrial fibrillation, unspecified: Secondary | ICD-10-CM | POA: Diagnosis not present

## 2020-02-10 DIAGNOSIS — Z7901 Long term (current) use of anticoagulants: Secondary | ICD-10-CM

## 2020-02-10 LAB — POCT INR: INR: 2.2 (ref 2.0–3.0)

## 2020-02-10 MED ORDER — TRAMADOL HCL 50 MG PO TABS: 100.0000 mg | ORAL_TABLET | Freq: Two times a day (BID) | ORAL | 5 refills | Status: DC | PRN

## 2020-02-10 NOTE — Patient Instructions (Signed)
Pre visit review using our clinic review tool, if applicable. No additional management support is needed unless otherwise documented below in the visit note.  Continue to take 5 mg daily except 2.5 mg on Wednesdays and Saturdays.  Dosing instructions given to daughter,  Janice [phone].  Re-check in 1 to 2 weeks.  Janice verbalized understanding.    

## 2020-02-10 NOTE — Telephone Encounter (Signed)
Refill sent.

## 2020-02-11 NOTE — Telephone Encounter (Signed)
Noted  

## 2020-02-22 ENCOUNTER — Ambulatory Visit (INDEPENDENT_AMBULATORY_CARE_PROVIDER_SITE_OTHER): Payer: Medicare Other | Admitting: General Practice

## 2020-02-22 DIAGNOSIS — I482 Chronic atrial fibrillation, unspecified: Secondary | ICD-10-CM

## 2020-02-22 DIAGNOSIS — Z7901 Long term (current) use of anticoagulants: Secondary | ICD-10-CM

## 2020-02-22 LAB — POCT INR: INR: 2 (ref 2.0–3.0)

## 2020-02-22 NOTE — Progress Notes (Signed)
Medical screening examination/treatment/procedure(s) were performed by non-physician practitioner and as supervising physician I was immediately available for consultation/collaboration. I agree with above. Lynnea Vandervoort, MD   

## 2020-02-22 NOTE — Patient Instructions (Signed)
Pre visit review using our clinic review tool, if applicable. No additional management support is needed unless otherwise documented below in the visit note.  Continue to take 5 mg daily except 2.5 mg on Wednesdays and Saturdays.  Dosing instructions given to daughter,  Janice [phone].  Re-check in 1 to 2 weeks.  Janice verbalized understanding.    

## 2020-02-23 ENCOUNTER — Other Ambulatory Visit: Payer: Self-pay | Admitting: Family Medicine

## 2020-02-23 DIAGNOSIS — I509 Heart failure, unspecified: Secondary | ICD-10-CM

## 2020-02-23 DIAGNOSIS — J302 Other seasonal allergic rhinitis: Secondary | ICD-10-CM

## 2020-02-23 DIAGNOSIS — R42 Dizziness and giddiness: Secondary | ICD-10-CM

## 2020-02-23 DIAGNOSIS — I482 Chronic atrial fibrillation, unspecified: Secondary | ICD-10-CM

## 2020-02-23 MED ORDER — MECLIZINE HCL 12.5 MG PO TABS: 12.5000 mg | ORAL_TABLET | Freq: Three times a day (TID) | ORAL | 0 refills | Status: DC | PRN

## 2020-02-24 ENCOUNTER — Other Ambulatory Visit: Payer: Self-pay | Admitting: Family Medicine

## 2020-02-24 MED ORDER — FEXOFENADINE HCL 60 MG PO TABS: 60.0000 mg | ORAL_TABLET | Freq: Every day | ORAL | 0 refills | Status: DC

## 2020-02-24 MED ORDER — METOPROLOL TARTRATE 50 MG PO TABS: 50.0000 mg | ORAL_TABLET | Freq: Two times a day (BID) | ORAL | 0 refills | Status: DC

## 2020-02-24 NOTE — Telephone Encounter (Signed)
Pt is requesting a refill on Gabapentin 300 mg capsule and Prednisone 5 mg tablet. Pt uses [facility]. Thanks

## 2020-02-24 NOTE — Telephone Encounter (Signed)
Please below Rx refill request  

## 2020-02-29 MED ORDER — GABAPENTIN 300 MG PO CAPS: 300.0000 mg | ORAL_CAPSULE | Freq: Every day | ORAL | 0 refills | Status: DC

## 2020-02-29 MED ORDER — PREDNISONE 5 MG PO TABS: 5.0000 mg | ORAL_TABLET | Freq: Every day | ORAL | 3 refills | Status: DC

## 2020-02-29 NOTE — Telephone Encounter (Signed)
Rx refilled electronically °

## 2020-02-29 NOTE — Telephone Encounter (Signed)
Ok to send in refills? 

## 2020-02-29 NOTE — Addendum Note (Signed)
Addended by: Waymon Amato R on: 02/29/2020 08:23 AM   Modules accepted: Orders

## 2020-03-07 ENCOUNTER — Ambulatory Visit (INDEPENDENT_AMBULATORY_CARE_PROVIDER_SITE_OTHER): Payer: Medicare Other | Admitting: General Practice

## 2020-03-07 DIAGNOSIS — Z7901 Long term (current) use of anticoagulants: Secondary | ICD-10-CM

## 2020-03-07 DIAGNOSIS — I482 Chronic atrial fibrillation, unspecified: Secondary | ICD-10-CM | POA: Diagnosis not present

## 2020-03-07 LAB — POCT INR: INR: 2.2 (ref 2.0–3.0)

## 2020-03-07 NOTE — Progress Notes (Signed)
Medical screening examination/treatment/procedure(s) were performed by non-physician practitioner and as supervising physician I was immediately available for consultation/collaboration. I agree with above. Cael Worth, MD   

## 2020-03-07 NOTE — Patient Instructions (Addendum)
Pre visit review using our clinic review tool, if applicable. No additional management support is needed unless otherwise documented below in the visit note.  Continue to take 5 mg daily except 2.5 mg on Wednesdays and Saturdays.  Dosing instructions given to daughter,  Janice [phone].  Re-check in 1 to 2 weeks.  Janice verbalized understanding.    

## 2020-03-13 ENCOUNTER — Other Ambulatory Visit: Payer: Self-pay | Admitting: Family Medicine

## 2020-03-13 DIAGNOSIS — E782 Mixed hyperlipidemia: Secondary | ICD-10-CM

## 2020-03-20 ENCOUNTER — Ambulatory Visit: Payer: Medicare Other | Admitting: Family Medicine

## 2020-03-21 ENCOUNTER — Other Ambulatory Visit: Payer: Self-pay | Admitting: Family Medicine

## 2020-03-21 DIAGNOSIS — I509 Heart failure, unspecified: Secondary | ICD-10-CM

## 2020-03-21 DIAGNOSIS — I482 Chronic atrial fibrillation, unspecified: Secondary | ICD-10-CM

## 2020-03-22 ENCOUNTER — Ambulatory Visit (INDEPENDENT_AMBULATORY_CARE_PROVIDER_SITE_OTHER): Payer: Medicare Other | Admitting: Family Medicine

## 2020-03-22 ENCOUNTER — Encounter: Payer: Self-pay | Admitting: Family Medicine

## 2020-03-22 ENCOUNTER — Other Ambulatory Visit: Payer: Self-pay

## 2020-03-22 VITALS — BP 102/72 | HR 95 | Temp 98.5°F | Wt 109.0 lb

## 2020-03-22 DIAGNOSIS — K219 Gastro-esophageal reflux disease without esophagitis: Secondary | ICD-10-CM | POA: Diagnosis not present

## 2020-03-22 DIAGNOSIS — M069 Rheumatoid arthritis, unspecified: Secondary | ICD-10-CM | POA: Diagnosis not present

## 2020-03-22 DIAGNOSIS — Z96642 Presence of left artificial hip joint: Secondary | ICD-10-CM

## 2020-03-22 DIAGNOSIS — I482 Chronic atrial fibrillation, unspecified: Secondary | ICD-10-CM | POA: Diagnosis not present

## 2020-03-22 DIAGNOSIS — G629 Polyneuropathy, unspecified: Secondary | ICD-10-CM

## 2020-03-22 NOTE — Progress Notes (Signed)
Subjective:    Patient ID: Stacey [name], female    DOB: 1933-04-18, 86 y.o.   MRN: [id number]  No chief complaint on file. Pt accompanied by her daughter, Thayer Headings.  HPI Pt is an 86 yo female with pmh sig for chronic afib on coumadin (home INR), CHF, PVD, neuropathy, hearing loss, OA, kyphosis, scoliosis, osteopenia, lymphedema who was seen today for f/u.  Pt states her caregiver, Juliene Pina, is no longer working with her as she had to have surgery.   Pt states overall doing well.  Feels like her L hip is popping out of place at times.  Pt had a L THR ~16 yrs ago in [place], by Dr. Garey Ham at the [facility]?.  Pt endorses CP x a few secs that radiated to sternum.  Sensation happened of and on for 3 days.  Pt denies feeling "flush or clamy", dizziness, SOB.  Pt was taking prilosec, but needs a refill.  Also needs refill on Lopressor 50 mg.  Pt wants to know if her gabapentin can be increased.  Pt seen by Cardiology, Dr. Tamala Julian.  Advised CHF improvig/stable.  Pt inquires about labs every 6 months.  Last BMPwas in April.  Past Medical History:  Diagnosis Date  . Atrial fibrillation ([place])   . Hernia of abdominal cavity   . Kyphosis     Allergies  Allergen Reactions  . Sulfa Antibiotics Anaphylaxis  . Codeine Nausea And Vomiting  . Latex Rash  . Neosporin Original [Bacitracin-Neomycin-Polymyxin] Rash  . Polysporin [Bacitracin-Polymyxin B] Rash    ROS General: Denies fever, chills, night sweats, changes in weight, changes in appetite HEENT: Denies headaches, ear pain, changes in vision, rhinorrhea, sore throat CV: Denies CP, palpitations, SOB, orthopnea  +CP Pulm: Denies SOB, cough, wheezing GI: Denies abdominal pain, nausea, vomiting, diarrhea, constipation  +GERD GU: Denies dysuria, hematuria, frequency, vaginal discharge Msk: Denies muscle cramps, joint pains  +L THR dislocating Neuro: Denies weakness, numbness, tingling Skin: Denies rashes, bruising Psych: Denies depression,  anxiety, hallucinations  Objective:    Blood pressure 102/72, pulse 95, temperature 98.5 F (36.9 C), weight 109 lb (49.4 kg), SpO2 96 %.  Gen. Pleasant, well-nourished, in no distress, normal affect   HEENT: [place]/AT, face symmetric, conjunctiva clear, no scleral icterus, PERRLA, EOMI, nares patent without drainage, TMs nml b/l. Lungs: no accessory muscle use, CTAB, no wheezes or rales Cardiovascular: regularly irregular, no m/r/g, no peripheral edema Abdomen: BS present, soft, NT/ND, no hepatosplenomegaly. Musculoskeletal:  Kyphosis, arthritic changes in b/l hands, thenar wasting.   no cyanosis or clubbing, normal tone Neuro:  A&Ox3, CN II-XII intact, normal gait Skin:  Warm, no lesions/ rash   Wt Readings from Last 3 Encounters:  03/22/20 109 lb (49.4 kg)  11/24/19 111 lb 12.8 oz (50.7 kg)  11/18/19 111 lb 9.6 oz (50.6 kg)    Lab Results  Component Value Date   WBC 8.6 11/24/2019   HGB 12.3 11/24/2019   HCT 37.4 11/24/2019   PLT 248 11/24/2019   GLUCOSE 73 11/24/2019   ALT 19 05/21/2019   AST 28 05/21/2019   NA 140 11/24/2019   K 4.9 11/24/2019   CL 98 11/24/2019   CREATININE 0.95 11/24/2019   BUN 15 11/24/2019   CO2 32 (H) 11/24/2019   INR 2.2 03/07/2020    Assessment/Plan:  History of total hip replacement, left  -THR 16 yrs ago in [place] by Dr. Garey Ham at the [facility]? -discussed obtaining imaging given reported dislocation -discussed referral to Ortho for  further eval as joint may need to be replaced. - Plan: DG Hip Unilat W OR W/O Pelvis 2-3 Views Left  Chronic atrial fibrillation (HCC)  -stable -on coumadin 5 mg daily. -INR goal 2-3.  Continue home INR checks and periodic clinic checks. -will obtain BMP in Oct, sooner if needed. - Plan: BMP with eGFR([facility])  Neuropathy -Currently taking gabapentin 300 mg daily.  Discussed increasing dose to 300 mg BID. -Caution advised as meds decreased 2/2 pt being lethargic in the past.  Discussed potential fall  risk.  Rheumatoid arthritis involving multiple joints -continue tramadol 100 mg BID prn.  Appears pt taking regularly not prn.   -caution advised 2/2 lethargy in the past. -continue prednisone 5 mg daily (historic med from prior Rheumatologist).  -Long term prednisone use with potential s/e   GERD -prilosec refilled -for continued or worsening symptoms f/u with GI.  F/u in 3-4 months  Grier Mitts, MD

## 2020-03-22 NOTE — Patient Instructions (Signed)

## 2020-03-23 MED ORDER — METOPROLOL TARTRATE 50 MG PO TABS: 50.0000 mg | ORAL_TABLET | Freq: Two times a day (BID) | ORAL | 0 refills | Status: DC

## 2020-03-24 DIAGNOSIS — Z7901 Long term (current) use of anticoagulants: Secondary | ICD-10-CM | POA: Diagnosis not present

## 2020-03-24 DIAGNOSIS — I4821 Permanent atrial fibrillation: Secondary | ICD-10-CM | POA: Diagnosis not present

## 2020-03-28 ENCOUNTER — Encounter: Payer: Self-pay | Admitting: Family Medicine

## 2020-03-28 ENCOUNTER — Ambulatory Visit (INDEPENDENT_AMBULATORY_CARE_PROVIDER_SITE_OTHER): Payer: Medicare Other | Admitting: General Practice

## 2020-03-28 DIAGNOSIS — Z7901 Long term (current) use of anticoagulants: Secondary | ICD-10-CM | POA: Diagnosis not present

## 2020-03-28 DIAGNOSIS — M069 Rheumatoid arthritis, unspecified: Secondary | ICD-10-CM | POA: Insufficient documentation

## 2020-03-28 DIAGNOSIS — Z96642 Presence of left artificial hip joint: Secondary | ICD-10-CM | POA: Insufficient documentation

## 2020-03-28 LAB — POCT INR: INR: 2.3 (ref 2.0–3.0)

## 2020-03-28 MED ORDER — OMEPRAZOLE 20 MG PO CPDR: DELAYED_RELEASE_CAPSULE | ORAL | 1 refills | Status: DC

## 2020-03-28 NOTE — Progress Notes (Signed)
Medical screening examination/treatment/procedure(s) were performed by non-physician practitioner and as supervising physician I was immediately available for consultation/collaboration. I agree with above. Kendrix Orman, MD   

## 2020-03-28 NOTE — Patient Instructions (Addendum)
Pre visit review using our clinic review tool, if applicable. No additional management support is needed unless otherwise documented below in the visit note.  Continue to take 5 mg daily except 2.5 mg on Wednesdays and Saturdays.  Dosing instructions given to daughter,  Janice [phone].  Re-check in 1 to 2 weeks.  Janice verbalized understanding.    

## 2020-04-04 ENCOUNTER — Ambulatory Visit (INDEPENDENT_AMBULATORY_CARE_PROVIDER_SITE_OTHER): Payer: Medicare Other | Admitting: General Practice

## 2020-04-04 DIAGNOSIS — Z7901 Long term (current) use of anticoagulants: Secondary | ICD-10-CM

## 2020-04-04 DIAGNOSIS — I482 Chronic atrial fibrillation, unspecified: Secondary | ICD-10-CM | POA: Diagnosis not present

## 2020-04-04 LAB — POCT INR: INR: 2.3 (ref 2.0–3.0)

## 2020-04-04 NOTE — Patient Instructions (Signed)
Pre visit review using our clinic review tool, if applicable. No additional management support is needed unless otherwise documented below in the visit note.  Continue to take 5 mg daily except 2.5 mg on Wednesdays and Saturdays.  Dosing instructions given to daughter,  Janice [phone].  Re-check in 1 to 2 weeks.  Janice verbalized understanding.    

## 2020-04-06 ENCOUNTER — Other Ambulatory Visit: Payer: Self-pay | Admitting: Family Medicine

## 2020-04-06 DIAGNOSIS — F32A Depression, unspecified: Secondary | ICD-10-CM

## 2020-04-06 DIAGNOSIS — F419 Anxiety disorder, unspecified: Secondary | ICD-10-CM

## 2020-04-10 NOTE — Telephone Encounter (Signed)
Pt LOV was on 03/22/2020 and last refill was done on 03/08/2019, please advise if ok for refill

## 2020-04-18 ENCOUNTER — Ambulatory Visit (INDEPENDENT_AMBULATORY_CARE_PROVIDER_SITE_OTHER): Payer: Medicare Other | Admitting: General Practice

## 2020-04-18 DIAGNOSIS — I482 Chronic atrial fibrillation, unspecified: Secondary | ICD-10-CM | POA: Diagnosis not present

## 2020-04-18 DIAGNOSIS — Z7901 Long term (current) use of anticoagulants: Secondary | ICD-10-CM

## 2020-04-18 LAB — POCT INR: INR: 1.7 — AB (ref 2.0–3.0)

## 2020-04-18 NOTE — Progress Notes (Signed)
Medical screening examination/treatment/procedure(s) were performed by non-physician practitioner and as supervising physician I was immediately available for consultation/collaboration. I agree with above. Alaa Eyerman, MD   

## 2020-04-18 NOTE — Patient Instructions (Signed)
Pre visit review using our clinic review tool, if applicable. No additional management support is needed unless otherwise documented below in the visit note.  Take 7.5 mg today and then continue to take 5 mg daily except 2.5 mg on Wednesdays and Saturdays.  Dosing instructions given to daughter,  [name] [phone].  Re-check in 1 to 2 weeks.  [name] verbalized understanding.

## 2020-05-08 ENCOUNTER — Other Ambulatory Visit: Payer: Self-pay | Admitting: Family Medicine

## 2020-05-08 DIAGNOSIS — J302 Other seasonal allergic rhinitis: Secondary | ICD-10-CM

## 2020-05-10 ENCOUNTER — Ambulatory Visit: Payer: Medicare Other | Admitting: Podiatry

## 2020-05-11 ENCOUNTER — Ambulatory Visit (INDEPENDENT_AMBULATORY_CARE_PROVIDER_SITE_OTHER): Payer: Medicare Other | Admitting: General Practice

## 2020-05-11 DIAGNOSIS — I482 Chronic atrial fibrillation, unspecified: Secondary | ICD-10-CM

## 2020-05-11 DIAGNOSIS — Z7901 Long term (current) use of anticoagulants: Secondary | ICD-10-CM

## 2020-05-11 LAB — POCT INR: INR: 2.6 (ref 2.0–3.0)

## 2020-05-11 NOTE — Progress Notes (Signed)
Medical screening examination/treatment/procedure(s) were performed by non-physician practitioner and as supervising physician I was immediately available for consultation/collaboration. I agree with above. Wannetta Langland, MD   

## 2020-05-11 NOTE — Patient Instructions (Signed)
Pre visit review using our clinic review tool, if applicable. No additional management support is needed unless otherwise documented below in the visit note.  Continue to take 5 mg daily except 2.5 mg on Wednesdays and Saturdays.  Dosing instructions given to daughter,  Janice [phone].  Re-check in 1 to 2 weeks.  Janice verbalized understanding.    

## 2020-05-18 ENCOUNTER — Ambulatory Visit (INDEPENDENT_AMBULATORY_CARE_PROVIDER_SITE_OTHER): Payer: Medicare Other | Admitting: General Practice

## 2020-05-18 DIAGNOSIS — Z7901 Long term (current) use of anticoagulants: Secondary | ICD-10-CM | POA: Diagnosis not present

## 2020-05-18 DIAGNOSIS — I4821 Permanent atrial fibrillation: Secondary | ICD-10-CM | POA: Diagnosis not present

## 2020-05-18 DIAGNOSIS — I482 Chronic atrial fibrillation, unspecified: Secondary | ICD-10-CM

## 2020-05-18 LAB — POCT INR: INR: 2.4 (ref 2.0–3.0)

## 2020-05-18 NOTE — Patient Instructions (Signed)
Pre visit review using our clinic review tool, if applicable. No additional management support is needed unless otherwise documented below in the visit note.  Continue to take 5 mg daily except 2.5 mg on Wednesdays and Saturdays.  Dosing instructions given to daughter,  Janice [phone].  Re-check in 1 to 2 weeks.  Janice verbalized understanding.    

## 2020-05-18 NOTE — Progress Notes (Signed)
Medical screening examination/treatment/procedure(s) were performed by non-physician practitioner and as supervising physician I was immediately available for consultation/collaboration. I agree with above. Floride Hutmacher, MD   

## 2020-05-29 DIAGNOSIS — M154 Erosive (osteo)arthritis: Secondary | ICD-10-CM | POA: Diagnosis not present

## 2020-05-29 DIAGNOSIS — M79642 Pain in left hand: Secondary | ICD-10-CM | POA: Diagnosis not present

## 2020-05-29 DIAGNOSIS — M792 Neuralgia and neuritis, unspecified: Secondary | ICD-10-CM | POA: Diagnosis not present

## 2020-05-29 DIAGNOSIS — Z7952 Long term (current) use of systemic steroids: Secondary | ICD-10-CM | POA: Diagnosis not present

## 2020-05-29 DIAGNOSIS — M25512 Pain in left shoulder: Secondary | ICD-10-CM | POA: Diagnosis not present

## 2020-05-29 DIAGNOSIS — M1120 Other chondrocalcinosis, unspecified site: Secondary | ICD-10-CM | POA: Diagnosis not present

## 2020-05-29 DIAGNOSIS — M150 Primary generalized (osteo)arthritis: Secondary | ICD-10-CM | POA: Diagnosis not present

## 2020-05-29 DIAGNOSIS — M79641 Pain in right hand: Secondary | ICD-10-CM | POA: Diagnosis not present

## 2020-05-30 ENCOUNTER — Ambulatory Visit (INDEPENDENT_AMBULATORY_CARE_PROVIDER_SITE_OTHER): Payer: Medicare Other | Admitting: General Practice

## 2020-05-30 DIAGNOSIS — I482 Chronic atrial fibrillation, unspecified: Secondary | ICD-10-CM

## 2020-05-30 DIAGNOSIS — Z7901 Long term (current) use of anticoagulants: Secondary | ICD-10-CM

## 2020-05-30 LAB — POCT INR: INR: 2.3 (ref 2.0–3.0)

## 2020-05-30 NOTE — Progress Notes (Signed)
Medical screening examination/treatment/procedure(s) were performed by non-physician practitioner and as supervising physician I was immediately available for consultation/collaboration. I agree with above. Blima Jaimes, MD   

## 2020-05-30 NOTE — Patient Instructions (Signed)
Pre visit review using our clinic review tool, if applicable. No additional management support is needed unless otherwise documented below in the visit note.  Continue to take 5 mg daily except 2.5 mg on Wednesdays and Saturdays.  Dosing instructions given to daughter,  Janice [phone].  Re-check in 1 to 2 weeks.  Janice verbalized understanding.    

## 2020-06-02 ENCOUNTER — Other Ambulatory Visit: Payer: Self-pay | Admitting: Family Medicine

## 2020-06-12 ENCOUNTER — Ambulatory Visit (INDEPENDENT_AMBULATORY_CARE_PROVIDER_SITE_OTHER): Payer: Medicare Other | Admitting: Podiatry

## 2020-06-12 ENCOUNTER — Encounter: Payer: Self-pay | Admitting: Podiatry

## 2020-06-12 ENCOUNTER — Other Ambulatory Visit: Payer: Self-pay

## 2020-06-12 DIAGNOSIS — B351 Tinea unguium: Secondary | ICD-10-CM

## 2020-06-12 DIAGNOSIS — M05772 Rheumatoid arthritis with rheumatoid factor of left ankle and foot without organ or systems involvement: Secondary | ICD-10-CM

## 2020-06-12 DIAGNOSIS — M79674 Pain in right toe(s): Secondary | ICD-10-CM

## 2020-06-12 DIAGNOSIS — I739 Peripheral vascular disease, unspecified: Secondary | ICD-10-CM

## 2020-06-12 DIAGNOSIS — M2042 Other hammer toe(s) (acquired), left foot: Secondary | ICD-10-CM

## 2020-06-12 DIAGNOSIS — S90415A Abrasion, left lesser toe(s), initial encounter: Secondary | ICD-10-CM

## 2020-06-12 DIAGNOSIS — M79675 Pain in left toe(s): Secondary | ICD-10-CM | POA: Diagnosis not present

## 2020-06-12 DIAGNOSIS — M05771 Rheumatoid arthritis with rheumatoid factor of right ankle and foot without organ or systems involvement: Secondary | ICD-10-CM

## 2020-06-12 DIAGNOSIS — M2041 Other hammer toe(s) (acquired), right foot: Secondary | ICD-10-CM

## 2020-06-13 ENCOUNTER — Other Ambulatory Visit: Payer: Self-pay | Admitting: Family Medicine

## 2020-06-13 DIAGNOSIS — J302 Other seasonal allergic rhinitis: Secondary | ICD-10-CM

## 2020-06-15 NOTE — Progress Notes (Signed)
Subjective: Benny Henrie is a pleasant 86 y.o. female patient seen today for at risk foot care. Patient has h/o PAD and painful callus(es) b/l feet and painful mycotic toenails b/l that are difficult to trim. Pain interferes with ambulation. Aggravating factors include wearing enclosed shoe gear. Pain is relieved with periodic professional debridement.   Her daughter is present during today's visit. Her caregiver, Hollie Salk, has retired due to health reasons.  Past Medical History:  Diagnosis Date  . Atrial fibrillation (HCC)   . Hernia of abdominal cavity   . Kyphosis     Patient Active Problem List   Diagnosis Date Noted  . Rheumatoid arthritis involving multiple sites (HCC) 03/28/2020  . History of total hip replacement, left 03/28/2020  . PVD (peripheral vascular disease) (HCC) 11/20/2019  . Osteopenia 11/20/2019  . Stenosis of both subclavian arteries 05/17/2019  . Conductive hearing loss, bilateral 07/03/2018  . Bilateral impacted cerumen 07/03/2018  . Acute swimmer's ear of left side 07/03/2018  . Long term (current) use of anticoagulants [Z79.01] 07/25/2017  . Chronic atrial fibrillation (HCC) 07/11/2017  . Primary osteoarthritis involving multiple joints 07/11/2017  . Pseudogout 07/11/2017  . Neuropathy 07/11/2017  . Lymphedema 07/11/2017  . Kyphosis of cervical region 07/11/2017  . Congestive heart failure (HCC) 07/11/2017    Current Outpatient Medications on File Prior to Visit  Medication Sig Dispense Refill  . atorvastatin (LIPITOR) 10 MG tablet TAKE 1 TABLET DAILY 90 tablet 3  . cyanocobalamin 1000 MCG tablet Take 1,000 mcg by mouth daily.    . fexofenadine (ALLEGRA) 180 MG tablet Take by mouth.    . fexofenadine (ALLEGRA) 60 MG tablet TAKE 1 TABLET DAILY 90 tablet 3  . gabapentin (NEURONTIN) 300 MG capsule TAKE 1 CAPSULE DAILY 90 capsule 3  . meclizine (ANTIVERT) 12.5 MG tablet Take 1 tablet (12.5 mg total) by mouth 3 (three) times daily as needed for  dizziness. 30 tablet 0  . metoprolol tartrate (LOPRESSOR) 50 MG tablet Take 1 tablet (50 mg total) by mouth 2 (two) times daily. 180 tablet 0  . Multiple Vitamin (MULTIVITAMIN) capsule Take 1 capsule by mouth daily.    . mupirocin ointment (BACTROBAN) 2 % Apply to wound once daily with light dressing. 30 g 1  . omeprazole (PRILOSEC) 20 MG capsule TAKE 1 CAPSULE TWICE A DAY BEFORE MEALS 180 capsule 1  . potassium chloride SA (KLOR-CON) 20 MEQ tablet TAKE 1 TABLET DAILY 90 tablet 3  . predniSONE (DELTASONE) 5 MG tablet Take 1 tablet (5 mg total) by mouth daily with breakfast. 90 tablet 3  . sertraline (ZOLOFT) 100 MG tablet TAKE 1 TABLET DAILY 90 tablet 3  . spironolactone (ALDACTONE) 25 MG tablet TAKE ONE-HALF (1/2) TABLET DAILY 90 tablet 3  . torsemide (DEMADEX) 20 MG tablet TAKE 2 TABLETS DAILY 180 tablet 1  . traMADol (ULTRAM) 50 MG tablet Take 2 tablets (100 mg total) by mouth 2 (two) times daily as needed. 120 tablet 5  . VITAMIN D, CHOLECALCIFEROL, PO Take 1,000 Units by mouth daily.     Marland Kitchen warfarin (COUMADIN) 5 MG tablet TAKE ONE-HALF (1/2) TABLET (2.5 MG) ON WEDNESDAY AND SATURDAY. 1 TABLET (5 MG) ALL OTHER DAYS 100 tablet 3   No current facility-administered medications on file prior to visit.    Allergies  Allergen Reactions  . Sulfa Antibiotics Anaphylaxis  . Codeine Nausea And Vomiting  . Latex Rash  . Neosporin Original [Bacitracin-Neomycin-Polymyxin] Rash  . Polysporin [Bacitracin-Polymyxin B] Rash    Objective: Physical  Exam  General: Sabine Tenenbaum is a pleasant 86 y.o. Caucasian female, frail, in NAD. AAO x 3.   Vascular:  Neurovascular status unchanged b/l lower extremities. Capillary fill time to digits <3 seconds b/l lower extremities. Faintly palpable DP pulse(s) b/l lower extremities. Nonpalpable PT pulse(s) b/l lower extremities. Pedal hair absent. Lower extremity skin temperature gradient warm to cool.  Dermatological:  Pedal skin is thin shiny, atrophic  b/l lower extremities. No open wounds bilaterally. No interdigital macerations bilaterally. Toenails 1-5 right and L hallux elongated, discolored, dystrophic, thickened, and crumbly with subungual debris and tenderness to dorsal palpation. Anonychia noted L 2nd toe, L 3rd toe, L 4th toe and L 5th toe. Nailbed(s) epithelialized.  Healing abrasion noted dorsal aspect left 4th toe. No erythema, no edema, no drainage, no fluctuance.  Musculoskeletal:  Normal muscle strength 5/5 to all lower extremity muscle groups bilaterally. No pain crepitus or joint limitation noted with ROM b/l. Severe hammertoes noted 1-5 bilaterally.  Neurological:  Protective sensation intact 5/5 intact bilaterally with 10g monofilament b/l. Vibratory sensation intact b/l. Proprioception intact bilaterally.  Assessment and Plan:  1. Pain due to onychomycosis of toenails of both feet   2. Abrasion of fourth toe of left foot, initial encounter   3. Acquired hammertoes of both feet   4. Rheumatoid arthritis involving both feet with positive rheumatoid factor (HCC)   5. PAD (peripheral artery disease) (HCC)    -Examined patient. -Toenails 1-5 right and L hallux debrided in length and girth without iatrogenic bleeding with sterile nail nipper and dremel.  -Continue daily padding of digits to prevent pressure ulcers due to severe hammertoe deformity. -For healing abrasion left 4th toe, daughter instructed to apply antibiotic ointment to digit once daily until resolved. -Dispensed foam toe separators for daily protection. -Patient to report any pedal injuries to medical professional immediately. -Patient to continue soft, supportive shoe gear daily. -Patient/POA to call should there be question/concern in the interim.  Return in about 3 months (around 09/12/2020).  Freddie Breech, DPM

## 2020-06-27 ENCOUNTER — Ambulatory Visit (INDEPENDENT_AMBULATORY_CARE_PROVIDER_SITE_OTHER): Payer: Medicare Other | Admitting: General Practice

## 2020-06-27 DIAGNOSIS — I482 Chronic atrial fibrillation, unspecified: Secondary | ICD-10-CM

## 2020-06-27 DIAGNOSIS — H25013 Cortical age-related cataract, bilateral: Secondary | ICD-10-CM | POA: Diagnosis not present

## 2020-06-27 DIAGNOSIS — H52223 Regular astigmatism, bilateral: Secondary | ICD-10-CM | POA: Diagnosis not present

## 2020-06-27 DIAGNOSIS — H524 Presbyopia: Secondary | ICD-10-CM | POA: Diagnosis not present

## 2020-06-27 DIAGNOSIS — H353132 Nonexudative age-related macular degeneration, bilateral, intermediate dry stage: Secondary | ICD-10-CM | POA: Diagnosis not present

## 2020-06-27 DIAGNOSIS — H2513 Age-related nuclear cataract, bilateral: Secondary | ICD-10-CM | POA: Diagnosis not present

## 2020-06-27 DIAGNOSIS — H25033 Anterior subcapsular polar age-related cataract, bilateral: Secondary | ICD-10-CM | POA: Diagnosis not present

## 2020-06-27 DIAGNOSIS — Z7901 Long term (current) use of anticoagulants: Secondary | ICD-10-CM

## 2020-06-27 DIAGNOSIS — H5203 Hypermetropia, bilateral: Secondary | ICD-10-CM | POA: Diagnosis not present

## 2020-06-27 LAB — POCT INR: INR: 2.6 (ref 2.0–3.0)

## 2020-06-27 NOTE — Patient Instructions (Signed)
Pre visit review using our clinic review tool, if applicable. No additional management support is needed unless otherwise documented below in the visit note.  Continue to take 5 mg daily except 2.5 mg on Wednesdays and Saturdays.  Dosing instructions given to daughter,  Janice [phone].  Re-check in 1 to 2 weeks.  Janice verbalized understanding.    

## 2020-06-27 NOTE — Progress Notes (Signed)
Medical screening examination/treatment/procedure(s) were performed by non-physician practitioner and as supervising physician I was immediately available for consultation/collaboration. I agree with above. Laynee Lockamy, MD   

## 2020-07-03 DIAGNOSIS — H353131 Nonexudative age-related macular degeneration, bilateral, early dry stage: Secondary | ICD-10-CM | POA: Diagnosis not present

## 2020-07-03 DIAGNOSIS — H2512 Age-related nuclear cataract, left eye: Secondary | ICD-10-CM | POA: Diagnosis not present

## 2020-07-03 DIAGNOSIS — H2513 Age-related nuclear cataract, bilateral: Secondary | ICD-10-CM | POA: Diagnosis not present

## 2020-07-10 ENCOUNTER — Other Ambulatory Visit: Payer: Self-pay | Admitting: Family Medicine

## 2020-07-10 ENCOUNTER — Ambulatory Visit (INDEPENDENT_AMBULATORY_CARE_PROVIDER_SITE_OTHER): Payer: Medicare Other | Admitting: General Practice

## 2020-07-10 DIAGNOSIS — I482 Chronic atrial fibrillation, unspecified: Secondary | ICD-10-CM

## 2020-07-10 DIAGNOSIS — I4821 Permanent atrial fibrillation: Secondary | ICD-10-CM | POA: Diagnosis not present

## 2020-07-10 DIAGNOSIS — Z7901 Long term (current) use of anticoagulants: Secondary | ICD-10-CM | POA: Diagnosis not present

## 2020-07-10 DIAGNOSIS — I509 Heart failure, unspecified: Secondary | ICD-10-CM

## 2020-07-10 LAB — POCT INR: INR: 3.2 — AB (ref 2.0–3.0)

## 2020-07-10 NOTE — Patient Instructions (Signed)
Pre visit review using our clinic review tool, if applicable. No additional management support is needed unless otherwise documented below in the visit note.  Skip dosage today and then continue to take 5 mg daily except 2.5 mg on Wednesdays and Saturdays.  Dosing instructions given to daughter,  [name] [phone].  Re-check in 1 to 2 weeks.  [name] verbalized understanding.

## 2020-07-18 ENCOUNTER — Other Ambulatory Visit: Payer: Self-pay | Admitting: Family Medicine

## 2020-07-27 ENCOUNTER — Ambulatory Visit (INDEPENDENT_AMBULATORY_CARE_PROVIDER_SITE_OTHER): Payer: Medicare Other | Admitting: General Practice

## 2020-07-27 DIAGNOSIS — I482 Chronic atrial fibrillation, unspecified: Secondary | ICD-10-CM | POA: Diagnosis not present

## 2020-07-27 DIAGNOSIS — Z7901 Long term (current) use of anticoagulants: Secondary | ICD-10-CM | POA: Diagnosis not present

## 2020-07-27 DIAGNOSIS — H2512 Age-related nuclear cataract, left eye: Secondary | ICD-10-CM | POA: Diagnosis not present

## 2020-07-27 DIAGNOSIS — Z961 Presence of intraocular lens: Secondary | ICD-10-CM | POA: Diagnosis not present

## 2020-07-27 LAB — POCT INR: INR: 2.9 (ref 2.0–3.0)

## 2020-07-27 NOTE — Progress Notes (Signed)
Medical screening examination/treatment/procedure(s) were performed by non-physician practitioner and as supervising physician I was immediately available for consultation/collaboration. I agree with above. Jonas Goh, MD   

## 2020-07-27 NOTE — Patient Instructions (Signed)
Pre visit review using our clinic review tool, if applicable. No additional management support is needed unless otherwise documented below in the visit note.  Continue to take 5 mg daily except 2.5 mg on Wednesdays and Saturdays.  Dosing instructions given to daughter,  Janice [phone].  Re-check in 1 to 2 weeks.  Janice verbalized understanding.    

## 2020-07-28 DIAGNOSIS — H2511 Age-related nuclear cataract, right eye: Secondary | ICD-10-CM | POA: Diagnosis not present

## 2020-08-10 ENCOUNTER — Ambulatory Visit (INDEPENDENT_AMBULATORY_CARE_PROVIDER_SITE_OTHER): Payer: Medicare Other | Admitting: General Practice

## 2020-08-10 DIAGNOSIS — Z7901 Long term (current) use of anticoagulants: Secondary | ICD-10-CM

## 2020-08-10 DIAGNOSIS — I482 Chronic atrial fibrillation, unspecified: Secondary | ICD-10-CM | POA: Diagnosis not present

## 2020-08-10 LAB — POCT INR: INR: 2.6 (ref 2.0–3.0)

## 2020-08-10 NOTE — Patient Instructions (Signed)
Pre visit review using our clinic review tool, if applicable. No additional management support is needed unless otherwise documented below in the visit note.  Continue to take 5 mg daily except 2.5 mg on Wednesdays and Saturdays.  Dosing instructions given to daughter,  Janice [phone].  Re-check in 1 to 2 weeks.  Janice verbalized understanding.    

## 2020-08-17 DIAGNOSIS — Z961 Presence of intraocular lens: Secondary | ICD-10-CM | POA: Diagnosis not present

## 2020-08-17 DIAGNOSIS — H2511 Age-related nuclear cataract, right eye: Secondary | ICD-10-CM | POA: Diagnosis not present

## 2020-08-22 ENCOUNTER — Ambulatory Visit (INDEPENDENT_AMBULATORY_CARE_PROVIDER_SITE_OTHER): Payer: Medicare Other | Admitting: General Practice

## 2020-08-22 DIAGNOSIS — Z7901 Long term (current) use of anticoagulants: Secondary | ICD-10-CM

## 2020-08-22 DIAGNOSIS — I482 Chronic atrial fibrillation, unspecified: Secondary | ICD-10-CM

## 2020-08-22 LAB — POCT INR: INR: 3 (ref 2.0–3.0)

## 2020-08-22 NOTE — Progress Notes (Signed)
Medical screening examination/treatment/procedure(s) were performed by non-physician practitioner and as supervising physician I was immediately available for consultation/collaboration. I agree with above. Alita Waldren, MD   

## 2020-08-22 NOTE — Patient Instructions (Signed)
Pre visit review using our clinic review tool, if applicable. No additional management support is needed unless otherwise documented below in the visit note.  Continue to take 5 mg daily except 2.5 mg on Wednesdays and Saturdays.  Dosing instructions given to daughter,  Janice [phone].  Re-check in 1 to 2 weeks.  Janice verbalized understanding.    

## 2020-08-23 ENCOUNTER — Other Ambulatory Visit: Payer: Self-pay | Admitting: Family Medicine

## 2020-08-23 DIAGNOSIS — K219 Gastro-esophageal reflux disease without esophagitis: Secondary | ICD-10-CM

## 2020-08-28 NOTE — Progress Notes (Signed)
Medical screening examination/treatment/procedure(s) were performed by non-physician practitioner and as supervising physician I was immediately available for consultation/collaboration. I agree with above. Derral Colucci, MD  

## 2020-08-30 ENCOUNTER — Ambulatory Visit (INDEPENDENT_AMBULATORY_CARE_PROVIDER_SITE_OTHER): Payer: Medicare Other

## 2020-08-30 ENCOUNTER — Other Ambulatory Visit: Payer: Self-pay

## 2020-08-30 VITALS — BP 120/76 | HR 48 | Temp 98.0°F | Ht 63.0 in | Wt 109.2 lb

## 2020-08-30 DIAGNOSIS — Z0000 Encounter for general adult medical examination without abnormal findings: Secondary | ICD-10-CM

## 2020-08-30 DIAGNOSIS — Z23 Encounter for immunization: Secondary | ICD-10-CM | POA: Diagnosis not present

## 2020-08-30 NOTE — Progress Notes (Signed)
Subjective:   Stacey [name] is a 87 y.o. female who presents for an Initial Medicare Annual Wellness Visit.  Review of Systems    N/A  Cardiac Risk Factors include: advanced age (>26men, >48 women);hypertension;dyslipidemia     Objective:    Today's Vitals   08/30/20 1552 08/30/20 1602  BP: 120/76   Pulse: (!) 48   Temp: 98 F (36.7 C)   TempSrc: Oral   SpO2: 92%   Weight: 109 lb 4 oz (49.6 kg)   Height: 5\' 3"  (1.6 m)   PainSc:  6    Body mass index is 19.35 kg/m.  Advanced Directives 08/30/2020 05/22/2019 07/28/2017  Does Patient Have a Medical Advance Directive? Yes No No  Type of Estate agent of [place];Living will - -  Does patient want to make changes to medical advance directive? No - Patient declined - -  Copy of Healthcare Power of Attorney in Chart? No - copy requested - -  Would patient like information on creating a medical advance directive? - No - Patient declined No - Patient declined    Current Medications (verified) Outpatient Encounter Medications as of 08/30/2020  Medication Sig  . ALLERGY RELIEF 180 MG tablet TAKE ONE-HALF (1/2) TABLET DAILY FOR ALLERGIES  . atorvastatin (LIPITOR) 10 MG tablet TAKE 1 TABLET DAILY  . cyanocobalamin 1000 MCG tablet Take 1,000 mcg by mouth daily.  Marland Kitchen gabapentin (NEURONTIN) 300 MG capsule TAKE 1 CAPSULE DAILY  . meclizine (ANTIVERT) 12.5 MG tablet Take 1 tablet (12.5 mg total) by mouth 3 (three) times daily as needed for dizziness.  . metoprolol tartrate (LOPRESSOR) 50 MG tablet TAKE 1 TABLET TWICE A DAY  . Multiple Vitamin (MULTIVITAMIN) capsule Take 1 capsule by mouth daily.  . mupirocin ointment (BACTROBAN) 2 % Apply to wound once daily with light dressing.  Marland Kitchen omeprazole (PRILOSEC) 20 MG capsule TAKE 1 CAPSULE TWICE A DAY BEFORE MEALS  . potassium chloride SA (KLOR-CON) 20 MEQ tablet TAKE 1 TABLET DAILY  . predniSONE (DELTASONE) 5 MG tablet Take 1 tablet (5 mg total) by mouth daily with  breakfast.  . sertraline (ZOLOFT) 100 MG tablet TAKE 1 TABLET DAILY  . spironolactone (ALDACTONE) 25 MG tablet TAKE ONE-HALF (1/2) TABLET DAILY  . torsemide (DEMADEX) 20 MG tablet TAKE 2 TABLETS DAILY  . traMADol (ULTRAM) 50 MG tablet Take 2 tablets (100 mg total) by mouth 2 (two) times daily as needed.  Marland Kitchen VITAMIN D, CHOLECALCIFEROL, PO Take 1,000 Units by mouth daily.   Marland Kitchen warfarin (COUMADIN) 5 MG tablet TAKE ONE-HALF (1/2) TABLET (2.5 MG) ON WEDNESDAY AND SATURDAY. 1 TABLET (5 MG) ALL OTHER DAYS  . [DISCONTINUED] fexofenadine (ALLEGRA) 60 MG tablet TAKE 1 TABLET DAILY   No facility-administered encounter medications on file as of 08/30/2020.    Allergies (verified) Sulfa antibiotics, Codeine, Latex, Neosporin original [bacitracin-neomycin-polymyxin], and Polysporin [bacitracin-polymyxin b]   History: Past Medical History:  Diagnosis Date  . Atrial fibrillation (HCC)   . Hernia of abdominal cavity   . Kyphosis    Past Surgical History:  Procedure Laterality Date  . ABDOMINAL HYSTERECTOMY    . CATARACT EXTRACTION, BILATERAL    . TONSILLECTOMY     Family History  Problem Relation Age of Onset  . Healthy Mother   . Heart failure Father    Social History   Socioeconomic History  . Marital status: Widowed    Spouse name: Not on file  . Number of children: Not on file  . Years  of education: Not on file  . Highest education level: Not on file  Occupational History  . Not on file  Tobacco Use  . Smoking status: Former Games developer  . Smokeless tobacco: Never Used  Vaping Use  . Vaping Use: Never used  Substance and Sexual Activity  . Alcohol use: No  . Drug use: No  . Sexual activity: Not on file  Other Topics Concern  . Not on file  Social History Narrative  . Not on file   Social Determinants of Health   Financial Resource Strain: Low Risk   . Difficulty of Paying Living Expenses: Not hard at all  Food Insecurity: No Food Insecurity  . Worried About Brewing technologist in the Last Year: Never true  . Ran Out of Food in the Last Year: Never true  Transportation Needs: No Transportation Needs  . Lack of Transportation (Medical): No  . Lack of Transportation (Non-Medical): No  Physical Activity: Inactive  . Days of Exercise per Week: 0 days  . Minutes of Exercise per Session: 0 min  Stress: No Stress Concern Present  . Feeling of Stress : Not at all  Social Connections: Socially Isolated  . Frequency of Communication with Friends and Family: Three times a week  . Frequency of Social Gatherings with Friends and Family: Never  . Attends Religious Services: Never  . Active Member of Clubs or Organizations: No  . Attends Banker Meetings: Never  . Marital Status: Widowed    Tobacco Counseling Counseling given: Not Answered   Clinical Intake:  Pre-visit preparation completed: Yes  Pain : 0-10 Pain Score: 6  Pain Type: Chronic pain Pain Location: Shoulder Pain Orientation: Left Pain Descriptors / Indicators: Aching Pain Onset: More than a month ago Pain Frequency: Intermittent Pain Relieving Factors: Resting arm straight down  Pain Relieving Factors: Resting arm straight down  Nutritional Risks: Nausea/ vomitting/ diarrhea (diarrhea) Diabetes: No  How often do you need to have someone help you when you read instructions, pamphlets, or other written materials from your doctor or pharmacy?: 4 - Often  Diabetic?No   Interpreter Needed?: No  Information entered by :: SCrews,LPN   Activities of Daily Living In your present state of health, do you have any difficulty performing the following activities: 08/30/2020  Hearing? N  Vision? N  Difficulty concentrating or making decisions? Y  Walking or climbing stairs? N  Dressing or bathing? N  Doing errands, shopping? Y  Preparing Food and eating ? Y  Comment preparing  Using the Toilet? N  In the past six months, have you accidently leaked urine? Y  Do you have problems  with loss of bowel control? Y  Managing your Medications? N  Managing your Finances? N  Housekeeping or managing your Housekeeping? N  Some recent data might be hidden    Patient Care Team: [name], MD as PCP - General (Family Medicine) Lyn Records, MD as PCP - Cardiology (Cardiology)  Indicate any recent Medical Services you may have received from other than [facility] providers in the past year (date may be approximate).     Assessment:   This is a routine wellness examination for [place].  Hearing/Vision screen  Hearing Screening   125Hz  250Hz  500Hz  1000Hz  2000Hz  3000Hz  4000Hz  6000Hz  8000Hz   Right ear:           Left ear:           Vision Screening Comments: Patient states gets eyes checked  once per year   Dietary issues and exercise activities discussed: Current Exercise Habits: The patient does not participate in regular exercise at present, Exercise limited by: orthopedic condition(s)  Goals    . Exercise 3x per week (30 min per time)      Depression Screen PHQ 2/9 Scores 08/30/2020 07/11/2017  PHQ - 2 Score 0 0  PHQ- 9 Score 0 -    Fall Risk Fall Risk  08/30/2020 07/11/2017  Falls in the past year? 0 Yes  Number falls in past yr: 0 2 or more  Injury with Fall? 0 Yes  Risk for fall due to : History of fall(s);Impaired balance/gait;Impaired mobility -  Follow up Falls evaluation completed;Falls prevention discussed -    FALL RISK PREVENTION PERTAINING TO THE HOME:  Any stairs in or around the home? No  If so, are there any without handrails? No  Home free of loose throw rugs in walkways, pet beds, electrical cords, etc? Yes  Adequate lighting in your home to reduce risk of falls? Yes   ASSISTIVE DEVICES UTILIZED TO PREVENT FALLS:  Life alert? No  Use of a cane, walker or w/c? Yes  Grab bars in the bathroom? Yes  Shower chair or bench in shower? Yes  Elevated toilet seat or a handicapped toilet? Yes   TIMED UP AND GO:  Was the test performed? Yes  .  Length of time to ambulate 10 feet: 8 sec.   Gait slow and steady with assistive device  Cognitive Function:     6CIT Screen 08/30/2020  What Year? 0 points  What month? 0 points  What time? 0 points  Count back from 20 0 points  Months in reverse 0 points  Repeat phrase 4 points  Total Score 4    Immunizations Immunization History  Administered Date(s) Administered  . Pneumococcal Polysaccharide-23 08/30/2020    TDAP status: Due, Education has been provided regarding the importance of this vaccine. Advised may receive this vaccine at local pharmacy or Health Dept. Aware to provide a copy of the vaccination record if obtained from local pharmacy or Health Dept. Verbalized acceptance and understanding.  Flu Vaccine status: Declined, Education has been provided regarding the importance of this vaccine but patient still declined. Advised may receive this vaccine at local pharmacy or Health Dept. Aware to provide a copy of the vaccination record if obtained from local pharmacy or Health Dept. Verbalized acceptance and understanding.  Pneumococcal vaccine status: Due, Education has been provided regarding the importance of this vaccine. Advised may receive this vaccine at local pharmacy or Health Dept. Aware to provide a copy of the vaccination record if obtained from local pharmacy or Health Dept. Verbalized acceptance and understanding.  Covid-19 vaccine status: Declined, Education has been provided regarding the importance of this vaccine but patient still declined. Advised may receive this vaccine at local pharmacy or Health Dept.or vaccine clinic. Aware to provide a copy of the vaccination record if obtained from local pharmacy or Health Dept. Verbalized acceptance and understanding.  Qualifies for Shingles Vaccine? Yes   Zostavax completed No   Shingrix Completed?: No.    Education has been provided regarding the importance of this vaccine. Patient has been advised to call  insurance company to determine out of pocket expense if they have not yet received this vaccine. Advised may also receive vaccine at local pharmacy or Health Dept. Verbalized acceptance and understanding.  Screening Tests Health Maintenance  Topic Date Due  . COVID-19 Vaccine (1) Never  done  . TETANUS/TDAP  Never done  . DEXA SCAN  Never done  . INFLUENZA VACCINE  Never done  . PNA vac Low Risk Adult (2 of 2 - PCV13) 08/30/2021    Health Maintenance  Health Maintenance Due  Topic Date Due  . COVID-19 Vaccine (1) Never done  . TETANUS/TDAP  Never done  . DEXA SCAN  Never done  . INFLUENZA VACCINE  Never done    Colorectal cancer screening: No longer required.   Mammogram status: No longer required due to age.  Bone Density Status: No longer required   Lung Cancer Screening: (Low Dose CT Chest recommended if Age 4-80 years, 30 pack-year currently smoking OR have quit w/in 15years.) does not qualify.   Lung Cancer Screening Referral: N/A   Additional Screening:  Hepatitis C Screening: does not qualify;   Vision Screening: Recommended annual ophthalmology exams for early detection of glaucoma and other disorders of the eye. Is the patient up to date with their annual eye exam?  Yes  Who is the provider or what is the name of the office in which the patient attends annual eye exams? [facility]  If pt is not established with a provider, would they like to be referred to a provider to establish care? No .   Dental Screening: Recommended annual dental exams for proper oral hygiene  Community Resource Referral / Chronic Care Management: CRR required this visit?  No   CCM required this visit?  No      Plan:     I have personally reviewed and noted the following in the patient's chart:   . Medical and social history . Use of alcohol, tobacco or illicit drugs  . Current medications and supplements . Functional ability and status . Nutritional  status . Physical activity . Advanced directives . List of other physicians . Hospitalizations, surgeries, and ER visits in previous 12 months . Vitals . Screenings to include cognitive, depression, and falls . Referrals and appointments  In addition, I have reviewed and discussed with patient certain preventive protocols, quality metrics, and best practice recommendations. A written personalized care plan for preventive services as well as general preventive health recommendations were provided to patient.     Theodora Blow, LPN   1/61/0960   Nurse Notes: None

## 2020-08-30 NOTE — Patient Instructions (Signed)
[name] , Thank you for taking time to come for your Medicare Wellness Visit. I appreciate your ongoing commitment to your health goals. Please review the following plan we discussed and let me know if I can assist you in the future.   Screening recommendations/referrals: Colonoscopy: No longer required  Mammogram: No longer required  Bone Density: No longer required  Recommended yearly ophthalmology/optometry visit for glaucoma screening and checkup Recommended yearly dental visit for hygiene and checkup  Vaccinations: Influenza vaccine: Patient declined  Pneumococcal vaccine: Up to date, next due 08/30/2021 Tdap vaccine: Currently due, if you wish to receive you may contact your insurance to discuss cost or you may await and injury to receive  Shingles vaccine: Currently due for Shingrix, if you wish to receive we recommend that you do so at your local pharmacy.     Advanced directives: Please bring in copies of your advanced medical directives so that we may scan them into your chart.   Conditions/risks identified: None   Next appointment: None   Preventive Care 65 Years and Older, Female Preventive care refers to lifestyle choices and visits with your health care provider that can promote health and wellness. What does preventive care include?  A yearly physical exam. This is also called an annual well check.  Dental exams once or twice a year.  Routine eye exams. Ask your health care provider how often you should have your eyes checked.  Personal lifestyle choices, including:  Daily care of your teeth and gums.  Regular physical activity.  Eating a healthy diet.  Avoiding tobacco and drug use.  Limiting alcohol use.  Practicing safe sex.  Taking low-dose aspirin every day.  Taking vitamin and mineral supplements as recommended by your health care provider. What happens during an annual well check? The services and screenings done by your health care  provider during your annual well check will depend on your age, overall health, lifestyle risk factors, and family history of disease. Counseling  Your health care provider may ask you questions about your:  Alcohol use.  Tobacco use.  Drug use.  Emotional well-being.  Home and relationship well-being.  Sexual activity.  Eating habits.  History of falls.  Memory and ability to understand (cognition).  Work and work Astronomer.  Reproductive health. Screening  You may have the following tests or measurements:  Height, weight, and BMI.  Blood pressure.  Lipid and cholesterol levels. These may be checked every 5 years, or more frequently if you are over 46 years old.  Skin check.  Lung cancer screening. You may have this screening every year starting at age 9 if you have a 30-pack-year history of smoking and currently smoke or have quit within the past 15 years.  Fecal occult blood test (FOBT) of the stool. You may have this test every year starting at age 31.  Flexible sigmoidoscopy or colonoscopy. You may have a sigmoidoscopy every 5 years or a colonoscopy every 10 years starting at age 75.  Hepatitis C blood test.  Hepatitis B blood test.  Sexually transmitted disease (STD) testing.  Diabetes screening. This is done by checking your blood sugar (glucose) after you have not eaten for a while (fasting). You may have this done every 1-3 years.  Bone density scan. This is done to screen for osteoporosis. You may have this done starting at age 46.  Mammogram. This may be done every 1-2 years. Talk to your health care provider about how often you should have  regular mammograms. Talk with your health care provider about your test results, treatment options, and if necessary, the need for more tests. Vaccines  Your health care provider may recommend certain vaccines, such as:  Influenza vaccine. This is recommended every year.  Tetanus, diphtheria, and acellular  pertussis (Tdap, Td) vaccine. You may need a Td booster every 10 years.  Zoster vaccine. You may need this after age 38.  Pneumococcal 13-valent conjugate (PCV13) vaccine. One dose is recommended after age 78.  Pneumococcal polysaccharide (PPSV23) vaccine. One dose is recommended after age 36. Talk to your health care provider about which screenings and vaccines you need and how often you need them. This information is not intended to replace advice given to you by your health care provider. Make sure you discuss any questions you have with your health care provider. Document Released: 08/25/2015 Document Revised: 04/17/2016 Document Reviewed: 05/30/2015 Elsevier Interactive Patient Education  2017 [employer] Prevention in the Home Falls can cause injuries. They can happen to people of all ages. There are many things you can do to make your home safe and to help prevent falls. What can I do on the outside of my home?  Regularly fix the edges of walkways and driveways and fix any cracks.  Remove anything that might make you trip as you walk through a door, such as a raised step or threshold.  Trim any bushes or trees on the path to your home.  Use bright outdoor lighting.  Clear any walking paths of anything that might make someone trip, such as rocks or tools.  Regularly check to see if handrails are loose or broken. Make sure that both sides of any steps have handrails.  Any raised decks and porches should have guardrails on the edges.  Have any leaves, snow, or ice cleared regularly.  Use sand or salt on walking paths during winter.  Clean up any spills in your garage right away. This includes oil or grease spills. What can I do in the bathroom?  Use night lights.  Install grab bars by the toilet and in the tub and shower. Do not use towel bars as grab bars.  Use non-skid mats or decals in the tub or shower.  If you need to sit down in the shower, use a plastic,  non-slip stool.  Keep the floor dry. Clean up any water that spills on the floor as soon as it happens.  Remove soap buildup in the tub or shower regularly.  Attach bath mats securely with double-sided non-slip rug tape.  Do not have throw rugs and other things on the floor that can make you trip. What can I do in the bedroom?  Use night lights.  Make sure that you have a light by your bed that is easy to reach.  Do not use any sheets or blankets that are too big for your bed. They should not hang down onto the floor.  Have a firm chair that has side arms. You can use this for support while you get dressed.  Do not have throw rugs and other things on the floor that can make you trip. What can I do in the kitchen?  Clean up any spills right away.  Avoid walking on wet floors.  Keep items that you use a lot in easy-to-reach places.  If you need to reach something above you, use a strong step stool that has a grab bar.  Keep electrical cords out of the  way.  Do not use floor polish or wax that makes floors slippery. If you must use wax, use non-skid floor wax.  Do not have throw rugs and other things on the floor that can make you trip. What can I do with my stairs?  Do not leave any items on the stairs.  Make sure that there are handrails on both sides of the stairs and use them. Fix handrails that are broken or loose. Make sure that handrails are as long as the stairways.  Check any carpeting to make sure that it is firmly attached to the stairs. Fix any carpet that is loose or worn.  Avoid having throw rugs at the top or bottom of the stairs. If you do have throw rugs, attach them to the floor with carpet tape.  Make sure that you have a light switch at the top of the stairs and the bottom of the stairs. If you do not have them, ask someone to add them for you. What else can I do to help prevent falls?  Wear shoes that:  Do not have high heels.  Have rubber  bottoms.  Are comfortable and fit you well.  Are closed at the toe. Do not wear sandals.  If you use a stepladder:  Make sure that it is fully opened. Do not climb a closed stepladder.  Make sure that both sides of the stepladder are locked into place.  Ask someone to hold it for you, if possible.  Clearly mark and make sure that you can see:  Any grab bars or handrails.  First and last steps.  Where the edge of each step is.  Use tools that help you move around (mobility aids) if they are needed. These include:  Canes.  Walkers.  Scooters.  Crutches.  Turn on the lights when you go into a dark area. Replace any light bulbs as soon as they burn out.  Set up your furniture so you have a clear path. Avoid moving your furniture around.  If any of your floors are uneven, fix them.  If there are any pets around you, be aware of where they are.  Review your medicines with your doctor. Some medicines can make you feel dizzy. This can increase your chance of falling. Ask your doctor what other things that you can do to help prevent falls. This information is not intended to replace advice given to you by your health care provider. Make sure you discuss any questions you have with your health care provider. Document Released: 05/25/2009 Document Revised: 01/04/2016 Document Reviewed: 09/02/2014 Elsevier Interactive Patient Education  2017 [employer].

## 2020-09-04 ENCOUNTER — Other Ambulatory Visit: Payer: Self-pay | Admitting: Family Medicine

## 2020-09-04 ENCOUNTER — Telehealth: Payer: Self-pay | Admitting: Family Medicine

## 2020-09-04 DIAGNOSIS — J302 Other seasonal allergic rhinitis: Secondary | ICD-10-CM

## 2020-09-04 DIAGNOSIS — Z7901 Long term (current) use of anticoagulants: Secondary | ICD-10-CM | POA: Diagnosis not present

## 2020-09-04 DIAGNOSIS — I4821 Permanent atrial fibrillation: Secondary | ICD-10-CM | POA: Diagnosis not present

## 2020-09-04 NOTE — Telephone Encounter (Signed)
Pt need a refill for: Tramadol 50mg  Pt is out of medication  Pt wants to know if Dr.Banks can call in a 2-week supply to [id number] , [place] - [id number]  And also send a prescription to: [facility] - [place], [place] - [address]  [address], [place]  Phone:  [phone] Fax:  [phone]

## 2020-09-05 NOTE — Telephone Encounter (Signed)
Pt LOV was on 03/22/2020 and last refill was done on 02/10/2020 for 120 with 5 refill, Please advise

## 2020-09-05 NOTE — Telephone Encounter (Signed)
The patient called today to follow up on her Rx refill request from yesterday.  I advised the patient that Dr. Salomon Fick is out of the office on Tuesday.

## 2020-09-06 ENCOUNTER — Encounter: Payer: Self-pay | Admitting: Family Medicine

## 2020-09-08 ENCOUNTER — Other Ambulatory Visit: Payer: Self-pay | Admitting: Family Medicine

## 2020-09-08 ENCOUNTER — Telehealth: Payer: Self-pay

## 2020-09-08 MED ORDER — TRAMADOL HCL 50 MG PO TABS: 100.0000 mg | ORAL_TABLET | Freq: Two times a day (BID) | ORAL | 5 refills | Status: DC | PRN

## 2020-09-08 MED ORDER — TRAMADOL HCL 50 MG PO TABS: 100.0000 mg | ORAL_TABLET | Freq: Two times a day (BID) | ORAL | 0 refills | Status: AC | PRN

## 2020-09-08 NOTE — Telephone Encounter (Signed)
Tramadol refill sent to mail order pharmacy and local pharmacy.

## 2020-09-11 NOTE — Telephone Encounter (Signed)
Rxs were sent to local and mail order pharmacy last wk by this provider.

## 2020-09-12 NOTE — Telephone Encounter (Signed)
Pt was notified that refills were sent to her pharmacy

## 2020-09-14 ENCOUNTER — Encounter: Payer: Self-pay | Admitting: Family Medicine

## 2020-09-18 ENCOUNTER — Other Ambulatory Visit: Payer: Self-pay | Admitting: Family Medicine

## 2020-09-18 DIAGNOSIS — G629 Polyneuropathy, unspecified: Secondary | ICD-10-CM

## 2020-09-18 LAB — PROTIME-INR: INR: 2.7 — AB (ref 0.9–1.1)

## 2020-09-18 MED ORDER — GABAPENTIN 400 MG PO CAPS: 400.0000 mg | ORAL_CAPSULE | Freq: Every day | ORAL | 1 refills | Status: DC

## 2020-09-25 ENCOUNTER — Encounter: Payer: Self-pay | Admitting: Podiatry

## 2020-09-25 ENCOUNTER — Ambulatory Visit (INDEPENDENT_AMBULATORY_CARE_PROVIDER_SITE_OTHER): Payer: Medicare Other | Admitting: Podiatry

## 2020-09-25 ENCOUNTER — Other Ambulatory Visit: Payer: Self-pay

## 2020-09-25 DIAGNOSIS — B351 Tinea unguium: Secondary | ICD-10-CM | POA: Diagnosis not present

## 2020-09-25 DIAGNOSIS — I739 Peripheral vascular disease, unspecified: Secondary | ICD-10-CM | POA: Diagnosis not present

## 2020-09-25 DIAGNOSIS — L84 Corns and callosities: Secondary | ICD-10-CM | POA: Diagnosis not present

## 2020-09-25 DIAGNOSIS — M05772 Rheumatoid arthritis with rheumatoid factor of left ankle and foot without organ or systems involvement: Secondary | ICD-10-CM

## 2020-09-25 DIAGNOSIS — M79675 Pain in left toe(s): Secondary | ICD-10-CM | POA: Diagnosis not present

## 2020-09-25 DIAGNOSIS — M79674 Pain in right toe(s): Secondary | ICD-10-CM | POA: Diagnosis not present

## 2020-09-25 DIAGNOSIS — M05771 Rheumatoid arthritis with rheumatoid factor of right ankle and foot without organ or systems involvement: Secondary | ICD-10-CM

## 2020-10-01 NOTE — Progress Notes (Signed)
Subjective: Stacey [name] is a pleasant 87 y.o. female patient seen today for at risk foot care. Patient has h/o PAD and painful callus(es) b/l feet and painful mycotic toenails b/l that are difficult to trim. Pain interferes with ambulation. Aggravating factors include wearing enclosed shoe gear. Pain is relieved with periodic professional debridement.   Her daughter is present during today's visit. Ms. Palecek is requesting new offloading pads for her digital deformities.   Allergies  Allergen Reactions  . Sulfa Antibiotics Anaphylaxis  . Codeine Nausea And Vomiting  . Latex Rash  . Neosporin Original [Bacitracin-Neomycin-Polymyxin] Rash  . Polysporin [Bacitracin-Polymyxin B] Rash    Objective: Physical Exam  General: Stacey [name] is a pleasant 87 y.o. Caucasian female, frail, in NAD. AAO x 3.   Vascular:  Neurovascular status unchanged b/l lower extremities. Capillary fill time to digits <3 seconds b/l lower extremities. Faintly palpable DP pulse(s) b/l lower extremities. Nonpalpable PT pulse(s) b/l lower extremities. Pedal hair absent. Lower extremity skin temperature gradient warm to cool.  Dermatological:  Pedal skin is thin shiny, atrophic b/l lower extremities. No open wounds bilaterally. No interdigital macerations bilaterally. Toenails 1-5 right and L hallux elongated, discolored, dystrophic, thickened, and crumbly with subungual debris and tenderness to dorsal palpation. Anonychia noted L 2nd toe, L 3rd toe, L 4th toe and L 5th toe. Nailbed(s) epithelialized.    Hyperkeratosis distal tip left hallux.  Musculoskeletal:  Normal muscle strength 5/5 to all lower extremity muscle groups bilaterally. No pain crepitus or joint limitation noted with ROM b/l. Severe hammertoes noted 1-5 bilaterally.  Neurological:  Protective sensation intact 5/5 intact bilaterally with 10g monofilament b/l. Vibratory sensation intact b/l. Proprioception intact  bilaterally.  Assessment and Plan:  1. Pain due to onychomycosis of toenails of both feet   2. Callus   3. Rheumatoid arthritis involving both feet with positive rheumatoid factor (HCC)   4. PAD (peripheral artery disease) (HCC)    -Examined patient. -Toenails 1-5 right and L hallux debrided in length and girth without iatrogenic bleeding with sterile nail nipper and dremel.  -Painted distal tip left hallux with Betadine and applied band-aid. Continue toe cap daily.  -Continue daily padding of digits to prevent pressure ulcers due to severe hammertoe deformity. -Dispensed foam toe separators for daily protection. -Patient to report any pedal injuries to medical professional immediately. -Patient to continue soft, supportive shoe gear daily. -Patient/POA to call should there be question/concern in the interim.  Return in about 3 months (around 12/23/2020).  Freddie Breech, DPM

## 2020-10-04 ENCOUNTER — Ambulatory Visit (INDEPENDENT_AMBULATORY_CARE_PROVIDER_SITE_OTHER): Payer: Medicare Other | Admitting: General Practice

## 2020-10-04 DIAGNOSIS — I482 Chronic atrial fibrillation, unspecified: Secondary | ICD-10-CM | POA: Diagnosis not present

## 2020-10-04 DIAGNOSIS — Z7901 Long term (current) use of anticoagulants: Secondary | ICD-10-CM

## 2020-10-04 LAB — POCT INR: INR: 2.7 (ref 2.0–3.0)

## 2020-10-04 NOTE — Patient Instructions (Signed)
Pre visit review using our clinic review tool, if applicable. No additional management support is needed unless otherwise documented below in the visit note.  Continue to take 5 mg daily except 2.5 mg on Wednesdays and Saturdays.  Dosing instructions given to daughter,  [name] [phone].  Re-check in 1 to 2 weeks.  [name] verbalized understanding.

## 2020-10-10 ENCOUNTER — Ambulatory Visit (INDEPENDENT_AMBULATORY_CARE_PROVIDER_SITE_OTHER): Payer: Medicare Other | Admitting: General Practice

## 2020-10-10 DIAGNOSIS — Z7901 Long term (current) use of anticoagulants: Secondary | ICD-10-CM | POA: Diagnosis not present

## 2020-10-10 DIAGNOSIS — I482 Chronic atrial fibrillation, unspecified: Secondary | ICD-10-CM | POA: Diagnosis not present

## 2020-10-10 LAB — POCT INR: INR: 2.1 (ref 2.0–3.0)

## 2020-10-10 NOTE — Patient Instructions (Signed)
Pre visit review using our clinic review tool, if applicable. No additional management support is needed unless otherwise documented below in the visit note.  Continue to take 5 mg daily except 2.5 mg on Wednesdays and Saturdays.  Dosing instructions given to daughter,  Janice [phone].  Re-check in 1 to 2 weeks.  Janice verbalized understanding.    

## 2020-10-10 NOTE — Progress Notes (Signed)
Patient ID: Stacey [name], female   DOB: 10/19/1932, 87 y.o.   MRN: [id number] Medical screening examination/treatment/procedure(s) were performed by non-physician practitioner and as supervising physician I was immediately available for consultation/collaboration. I agree with above. Oliver Barre, MD

## 2020-10-16 ENCOUNTER — Other Ambulatory Visit: Payer: Self-pay | Admitting: Family Medicine

## 2020-10-16 DIAGNOSIS — Z7901 Long term (current) use of anticoagulants: Secondary | ICD-10-CM

## 2020-10-24 ENCOUNTER — Ambulatory Visit (INDEPENDENT_AMBULATORY_CARE_PROVIDER_SITE_OTHER): Payer: Medicare Other | Admitting: General Practice

## 2020-10-24 DIAGNOSIS — Z7901 Long term (current) use of anticoagulants: Secondary | ICD-10-CM | POA: Diagnosis not present

## 2020-10-24 DIAGNOSIS — I482 Chronic atrial fibrillation, unspecified: Secondary | ICD-10-CM | POA: Diagnosis not present

## 2020-10-24 LAB — POCT INR: INR: 2.8 (ref 2.0–3.0)

## 2020-10-24 NOTE — Patient Instructions (Signed)
Pre visit review using our clinic review tool, if applicable. No additional management support is needed unless otherwise documented below in the visit note.  Continue to take 5 mg daily except 2.5 mg on Wednesdays and Saturdays.  Dosing instructions given to daughter,  [name] [phone].  Re-check in 1 to 2 weeks.  [name] verbalized understanding.

## 2020-10-24 NOTE — Progress Notes (Signed)
Medical screening examination/treatment/procedure(s) were performed by non-physician practitioner and as supervising physician I was immediately available for consultation/collaboration. I agree with above. Teriann Livingood, MD   

## 2020-11-05 DIAGNOSIS — I4821 Permanent atrial fibrillation: Secondary | ICD-10-CM | POA: Diagnosis not present

## 2020-11-05 DIAGNOSIS — Z7901 Long term (current) use of anticoagulants: Secondary | ICD-10-CM | POA: Diagnosis not present

## 2020-11-05 LAB — PROTIME-INR: INR: 2.6 — AB (ref 0.9–1.1)

## 2020-11-21 DIAGNOSIS — H6123 Impacted cerumen, bilateral: Secondary | ICD-10-CM | POA: Diagnosis not present

## 2020-11-21 DIAGNOSIS — H9113 Presbycusis, bilateral: Secondary | ICD-10-CM | POA: Insufficient documentation

## 2020-11-28 ENCOUNTER — Ambulatory Visit (INDEPENDENT_AMBULATORY_CARE_PROVIDER_SITE_OTHER): Payer: Medicare Other | Admitting: General Practice

## 2020-11-28 DIAGNOSIS — I482 Chronic atrial fibrillation, unspecified: Secondary | ICD-10-CM | POA: Diagnosis not present

## 2020-11-28 DIAGNOSIS — Z7901 Long term (current) use of anticoagulants: Secondary | ICD-10-CM | POA: Diagnosis not present

## 2020-11-28 LAB — POCT INR: INR: 2.5 (ref 2.0–3.0)

## 2020-11-28 NOTE — Patient Instructions (Signed)
Pre visit review using our clinic review tool, if applicable. No additional management support is needed unless otherwise documented below in the visit note.  Continue to take 5 mg daily except 2.5 mg on Wednesdays and Saturdays.  Dosing instructions given to daughter,  Stacey [name] [phone].  Re-check in 1 to 2 weeks.  Stacey [name] verbalized understanding.    

## 2020-11-28 NOTE — Progress Notes (Signed)
Medical screening examination/treatment/procedure(s) were performed by non-physician practitioner and as supervising physician I was immediately available for consultation/collaboration. I agree with above. Sritha Chauncey, MD   

## 2020-11-29 ENCOUNTER — Other Ambulatory Visit: Payer: Self-pay | Admitting: Family Medicine

## 2020-12-06 ENCOUNTER — Other Ambulatory Visit: Payer: Self-pay

## 2020-12-07 ENCOUNTER — Encounter: Payer: Self-pay | Admitting: Family Medicine

## 2020-12-07 ENCOUNTER — Ambulatory Visit (INDEPENDENT_AMBULATORY_CARE_PROVIDER_SITE_OTHER): Payer: Medicare Other | Admitting: Family Medicine

## 2020-12-07 VITALS — BP 120/70 | HR 62 | Temp 97.3°F | Wt 103.4 lb

## 2020-12-07 DIAGNOSIS — R195 Other fecal abnormalities: Secondary | ICD-10-CM

## 2020-12-07 DIAGNOSIS — Z8719 Personal history of other diseases of the digestive system: Secondary | ICD-10-CM

## 2020-12-07 DIAGNOSIS — Z7901 Long term (current) use of anticoagulants: Secondary | ICD-10-CM | POA: Diagnosis not present

## 2020-12-07 NOTE — Progress Notes (Signed)
Subjective:    Patient ID: Stacey [name], female    DOB: 1933/07/10, 87 y.o.   MRN: [id number]  Chief Complaint  Patient presents with  . Diarrhea    Started middle of last week, more than normal. States has 'explosive' diarrhea in the morning before making to bathroom in the adult diaper. Tried Immoduim not helping, trying banatrol. Curious as it may be an infection  Patient accompanied by her daughter.  HPI Patient is an 87 year old female with pmh sig for h/o A. fib on chronic anticoagulation, kyphosis, CHF, PVD, hearing loss, OA of multiple joints, osteopenia, RA who was seen today for acute concern.  Patient endorses loose stools starting last week.  Typically occur in the morning.  Patient at times unable to make it to the bathroom.  Wearing adult diapers.  Tried Imodium and Banatrol.  Denies fever, chills, cough, abd pain, n/v, blood in stools, changes in diet or new medications.  No one else in the household having episodes of loose stools.  Patient notes increased heartburn symptoms.  Concern hiatal hernia worsening.  Past Medical History:  Diagnosis Date  . Atrial fibrillation (HCC)   . Hernia of abdominal cavity   . Kyphosis     Allergies  Allergen Reactions  . Sulfa Antibiotics Anaphylaxis  . Codeine Nausea And Vomiting  . Latex Rash  . Neosporin Original [Bacitracin-Neomycin-Polymyxin] Rash  . Polysporin [Bacitracin-Polymyxin B] Rash   Family History  Problem Relation Age of Onset  . Healthy Mother   . Heart failure Father     ROS General: Denies fever, chills, night sweats, changes in weight, changes in appetite HEENT: Denies headaches, ear pain, changes in vision, rhinorrhea, sore throat CV: Denies CP, palpitations, SOB, orthopnea Pulm: Denies SOB, cough, wheezing GI: Denies abdominal pain, nausea, vomiting, constipation  + loose stools. GU: Denies dysuria, hematuria, frequency, vaginal discharge Msk: Denies muscle cramps, joint pains Neuro: Denies  weakness, numbness, tingling Skin: Denies rashes, bruising Psych: Denies depression, anxiety, hallucinations    Objective:    Blood pressure 120/70, pulse 62, temperature (!) 97.3 F (36.3 C), temperature source Oral, weight 103 lb 6.4 oz (46.9 kg), SpO2 94 %.  Gen. Pleasant, well-nourished, in no distress, normal affect  HEENT: [place]/AT, face symmetric, conjunctiva clear, no scleral icterus, PERRLA, EOMI, nares patent without drainage Lungs: no accessory muscle use, CTAB, no wheezes or rales Cardiovascular: RRR, no m/r/g, no peripheral edema Abdomen: BS normoactive, soft, NT/ND Musculoskeletal: Kyphosis. No cyanosis or clubbing, normal tone Neuro:  A&Ox3, CN II-XII intact, ambulating with cane Skin:  Warm, no lesions/ rash   Wt Readings from Last 3 Encounters:  12/07/20 103 lb 6.4 oz (46.9 kg)  08/30/20 109 lb 4 oz (49.6 kg)  03/22/20 109 lb (49.4 kg)    Lab Results  Component Value Date   WBC 8.6 11/24/2019   HGB 12.3 11/24/2019   HCT 37.4 11/24/2019   PLT 248 11/24/2019   GLUCOSE 73 11/24/2019   ALT 19 05/21/2019   AST 28 05/21/2019   NA 140 11/24/2019   K 4.9 11/24/2019   CL 98 11/24/2019   CREATININE 0.95 11/24/2019   BUN 15 11/24/2019   CO2 32 (H) 11/24/2019   INR 2.5 11/28/2020    Assessment/Plan:  Loose stools -Discussed possible causes including malabsorption, viral etiology, bacterial infection, diet, constipation. -Discussed probiotics, and a bowel regimen such as Colace and senna or MiraLAX -For continued episodes of loose stools or diarrhea obtain stool studies -Given handout  - Plan: CBC  with Differential/Platelet, Comprehensive metabolic panel, Protime-INR, Lipase  History of hiatal hernia -Discussed diet modifications -Continue omeprazole 20 mg - Plan: Comprehensive metabolic panel  Long term (current) use of anticoagulants -2/2 h/o A. fib -INR goal 2.0-3.0 -Continue current dosing 2.5 mg every Wednesday, Saturday, 5 mg all other days.  Weekly  total 30 mg -Continue home INR/follow-up with Bailey Mech, RN  - Plan: Protime-INR  F/u in 1 week for continued or worsened symptoms, sooner if needed  Abbe Amsterdam, MD

## 2020-12-07 NOTE — Patient Instructions (Signed)
Fecal Incontinence Fecal incontinence, also called accidental bowel leakage, is not being able to control your bowels. This condition happens because the nerves or muscles around the anus do not work the way they should. This affects their ability to hold stool (feces). What are the causes? This condition may be caused by:  Damage to the muscles at the end of the rectum (sphincter).  Damage to the nerves that control bowel movements.  Diarrhea.  Chronic constipation.  Pelvic floor dysfunction. This means the muscles in the pelvis do not work well.  Loss of bowel storage capacity. This occurs when the rectum can no longer stretch in size in order to store feces.  Inflammatory bowel disease (IBD), such as Crohn's disease.  Irritable bowel syndrome (IBS). What increases the risk? You are more likely to develop this condition if you:  Were born with bowels or a pelvis that did not form correctly.  Have had rectal surgery.  Have had radiation treatment for certain cancers.  Have been pregnant, had a vaginal delivery, or had surgery that damaged the pelvic floor muscles.  Had a complicated childbirth, spinal cord injury, or other trauma that caused nerve damage.  Have a condition that can affect nerve function, such as diabetes, Parkinson's disease, or multiple sclerosis.  Have a condition where the rectum drops down into the anus or vagina (prolapse).  Are 59 years of age or older. What are the signs or symptoms? The main symptom of this condition is not being able to control your bowels. You also might not be able to get to the bathroom before a bowel movement. How is this diagnosed? This condition is diagnosed with a medical history and physical exam. You may also have other tests, including:  Blood tests.  Urine tests.  A rectal exam.  Ultrasound.  MRI.  Colonoscopy. This is an exam that looks at your large intestine (colon).  Anal manometry. This is a test that  measures the strength of the anal sphincter.  Anal electromyogram (EMG). This is a test that uses small electrodes to check for nerve damage. How is this treated? Treatment for this condition depends on the cause and severity. Treatment may also focus on addressing any underlying causes of this condition. Treatment may include:  Medicines. This may include medicines to: ? Prevent diarrhea. ? Help with constipation (bulk-forming laxatives). ? Treat any underlying conditions.  Biofeedback therapy. This can help to retrain muscles that are affected.  Fiber supplements. These can help manage your bowel movements.  Nerve stimulation.  Injectable gel to promote tissue growth and better muscle control.  Surgery. You may need: ? Sphincter repair surgery. ? Diversion surgery. This procedure lets feces pass out of your body through a hole in your abdomen. Follow these instructions at home: Eating and drinking  Follow instructions from your health care provider about any eating or drinking restrictions. ? Work with a dietitian to come up with a healthy diet that will help you avoid the foods that can make your condition worse. ? Keep a diet diary to find out which foods or drinks could be making your condition worse.  Drink enough fluid to keep your urine pale yellow.   Lifestyle  Do not use any products that contain nicotine or tobacco, such as cigarettes and e-cigarettes. If you need help quitting, ask your health care provider. This may help your condition.  If you are overweight, talk with your health care provider about how to safely lose weight. This may  help your condition.  Increase your physical activity as told by your health care provider. This may help your condition. Always talk with your health care provider before starting a new exercise program.  Carry a change of clothes and supplies to clean up quickly if you have an episode of fetal incontinence.  Consider joining a  fecal incontinence support group. You can find a support group online or in your local community. General instructions  Take over-the-counter and prescription medicines only as told by your health care provider. This includes any supplements.  Apply a moisture barrier, such as petroleum jelly, to your rectum. This protects the skin from irritation caused by ongoing leaking or diarrhea.  Tell your health care provider if you are upset or depressed about your condition.  Keep all follow-up visits as told by your health care provider. This is important.   Where to find more information  International Foundation for Functional Gastrointestinal Disorders: iffgd.org  [employer]: patients.gi.org Contact a health care provider if:  You have a fever.  You have redness, swelling, or pain around your rectum.  Your pain is getting worse or you lose feeling in your rectal area.  You have blood in your stool.  You feel sad or hopeless.  You avoid social or work situations. Get help right away if:  You stop having bowel movements.  You cannot eat or drink without vomiting.  You have rectal bleeding that does not stop.  You have severe pain that is getting worse.  You have symptoms of dehydration, including: ? Sleepiness or fatigue. ? Producing little or no urine, tears, or sweat. ? Dizziness. ? Dry mouth. ? Unusual irritability. ? Headache. ? Inability to think clearly. Summary  Fecal incontinence, also called accidental bowel leakage, is not being able to control your bowels. This condition happens because the nerves or muscles around the anus do not work the way they should.  Treatment varies depending on the cause and severity of your condition. Treatment may also focus on addressing any underlying causes of this condition.  Follow instructions from your health care provider about any eating or drinking restrictions, lifestyle changes, and skin  care.  Take over-the-counter and prescription medicines only as told by your health care provider. This includes any supplements.  Tell your health care provider if your symptoms worsen or if you are upset or depressed about your condition. This information is not intended to replace advice given to you by your health care provider. Make sure you discuss any questions you have with your health care provider. Document Revised: 12/11/2017 Document Reviewed: 12/11/2017 Elsevier Patient Education  2021 [employer] Inc.  [place] Diet A bland diet consists of foods that are often soft and do not have a lot of fat, fiber, or extra seasonings. Foods without fat, fiber, or seasoning are easier for the body to digest. They are also less likely to irritate your mouth, throat, stomach, and other parts of your digestive system. A bland diet is sometimes called a BRAT diet. What is my plan? Your health care provider or food and nutrition specialist (dietitian) may recommend specific changes to your diet to prevent symptoms or to treat your symptoms. These changes may include:  Eating small meals often.  Cooking food until it is soft enough to chew easily.  Chewing your food well.  Drinking fluids slowly.  Not eating foods that are very spicy, sour, or fatty.  Not eating citrus fruits, such as oranges and grapefruit. What  do I need to know about this diet?  Eat a variety of foods from the bland diet food list.  Do not follow a bland diet longer than needed.  Ask your health care provider whether you should take vitamins or supplements. What foods can I eat? Grains Hot cereals, such as cream of wheat. Rice. Bread, crackers, or tortillas made from refined white flour.   Vegetables Canned or cooked vegetables. Mashed or boiled potatoes. Fruits Bananas. Applesauce. Other types of cooked or canned fruit with the skin and seeds removed, such as canned peaches or pears.   Meats and other  proteins Scrambled eggs. Creamy peanut butter or other nut butters. Lean, well-cooked meats, such as chicken or fish. Tofu. Soups or broths.   Dairy Low-fat dairy products, such as milk, cottage cheese, or yogurt. Beverages Water. Herbal tea. Apple juice.   Fats and oils Mild salad dressings. Canola or olive oil. Sweets and desserts Pudding. Custard. Fruit gelatin. Ice cream. The items listed above may not be a complete list of recommended foods and beverages. Contact a dietitian for more options. What foods are not recommended? Grains Whole grain breads and cereals. Vegetables Raw vegetables. Fruits Raw fruits, especially citrus, berries, or dried fruits. Dairy Whole fat dairy foods. Beverages Caffeinated drinks. Alcohol. Seasonings and condiments Strongly flavored seasonings or condiments. Hot sauce. Salsa. Other foods Spicy foods. Fried foods. Sour foods, such as pickled or fermented foods. Foods with high sugar content. Foods high in fiber. The items listed above may not be a complete list of foods and beverages to avoid. Contact a dietitian for more information. Summary  A bland diet consists of foods that are often soft and do not have a lot of fat, fiber, or extra seasonings.  Foods without fat, fiber, or seasoning are easier for the body to digest.  Check with your health care provider to see how long you should follow this diet plan. It is not meant to be followed for long periods. This information is not intended to replace advice given to you by your health care provider. Make sure you discuss any questions you have with your health care provider. Document Revised: 08/27/2017 Document Reviewed: 08/27/2017 Elsevier Patient Education  2021 Elsevier Inc.  Probiotics Probiotics are the good bacteria and yeasts that live in your body and keep your digestive system healthy. Probiotics also help your body's defense system (immune system) and protect your body against the  growth of harmful bacteria. Your health care provider may recommend taking a probiotic if you are taking antibiotics or have certain medical conditions, such as:  Diarrhea.  Constipation.  Irritable bowel syndrome.  Lung infections.  Yeast infections.  Acne, eczema, and other skin conditions.  Frequent urinary tract infections. What affects the balance of bacteria in my body? The balance of good bacteria in your body can be affected by:  Antibiotic medicines. These medicines treat infections caused by bacteria. Unfortunately, they may kill the good bacteria in your body as well as the bad bacteria.  Certain medical conditions. Conditions related to an imbalance of bacteria include: ? Stomach and intestine (gastrointestinal) infections. ? Lung infections. ? Skin infections. ? Vaginal infections. ? Inflammatory bowel diseases. ? Stomach ulcers (gastric ulcers). ? Tooth decay and gum disease (periodontal disease).  Stress.  Poor diet. What type of probiotic is right for me? Probiotics contain different types of bacteria (strains). Strains commonly found in probiotics include:  Lactobacillus.  Saccharomyces.  Bifidobacterium. Specific strains have been shown to be  more effective for certain health conditions. Ask your health care provider which strain or strains you should use and how often. Probiotics come in many different forms, strain combinations, and strengths. Some may need to be refrigerated. Always read the label for storage and usage instructions. Certain foods, such as yogurt, contain probiotics. Probiotics can also be bought as a supplement at a pharmacy, health food store, or grocery store. Talk to your health care provider before starting any supplement.   What are the side effects of probiotics? Some people have side effects when taking probiotics. Side effects are usually temporary and may include:  Gas.  Bloating.  Cramping. Serious side effects are  rare. Follow these instructions at home:  If you are taking probiotics with antibiotics: ? Wait at least 2 hours between taking your medicine and the probiotic. ? Eat foods high in fiber, such as whole grains, beans, and vegetables. These foods can help good bacteria grow. ? Avoid certain foods as told by your health care provider.   Summary  Probiotics are the good bacteria and yeasts that live in your body and keep you and your digestive system healthy.  Certain foods, such as yogurt, contain probiotics.  Probiotics can be taken as supplements. They can be bought at a pharmacy, health food store, or grocery store. They come in many different forms, strain combinations, and strengths.  Be sure to talk with your health care provider before taking a probiotic supplement. This information is not intended to replace advice given to you by your health care provider. Make sure you discuss any questions you have with your health care provider. Document Revised: 04/17/2018 Document Reviewed: 08/13/2017 Elsevier Patient Education  2021 [employer].

## 2020-12-08 LAB — COMPREHENSIVE METABOLIC PANEL
ALT: 16 U/L (ref 0–35)
AST: 28 U/L (ref 0–37)
Albumin: 4.1 g/dL (ref 3.5–5.2)
Alkaline Phosphatase: 88 U/L (ref 39–117)
BUN: 17 mg/dL (ref 6–23)
CO2: 32 mEq/L (ref 19–32)
Calcium: 9.7 mg/dL (ref 8.4–10.5)
Chloride: 94 mEq/L — ABNORMAL LOW (ref 96–112)
Creatinine, Ser: 0.91 mg/dL (ref 0.40–1.20)
GFR: 57.36 mL/min — ABNORMAL LOW (ref >60.00)
Glucose, Bld: 107 mg/dL — ABNORMAL HIGH (ref 70–99)
Potassium: 4.5 mEq/L (ref 3.5–5.1)
Sodium: 134 mEq/L — ABNORMAL LOW (ref 135–145)
Total Bilirubin: 0.5 mg/dL (ref 0.2–1.2)
Total Protein: 7.8 g/dL (ref 6.0–8.3)

## 2020-12-08 LAB — CBC WITH DIFFERENTIAL/PLATELET
Basophils Absolute: 0.1 10*3/uL (ref 0.0–0.1)
Basophils Relative: 0.8 % (ref 0.0–3.0)
Eosinophils Absolute: 0 10*3/uL (ref 0.0–0.7)
Eosinophils Relative: 0.5 % (ref 0.0–5.0)
HCT: 36.3 % (ref 36.0–46.0)
Hemoglobin: 12.2 g/dL (ref 12.0–15.0)
Lymphocytes Relative: 5.9 % — ABNORMAL LOW (ref 12.0–46.0)
Lymphs Abs: 0.4 10*3/uL — ABNORMAL LOW (ref 0.7–4.0)
MCHC: 33.7 g/dL (ref 30.0–36.0)
MCV: 90 fl (ref 78.0–100.0)
Monocytes Absolute: 0.3 10*3/uL (ref 0.1–1.0)
Monocytes Relative: 4.4 % (ref 3.0–12.0)
Neutro Abs: 5.7 10*3/uL (ref 1.4–7.7)
Neutrophils Relative %: 88.4 % — ABNORMAL HIGH (ref 43.0–77.0)
Platelets: 324 10*3/uL (ref 150.0–400.0)
RBC: 4.03 Mil/uL (ref 3.87–5.11)
RDW: 14.8 % (ref 11.5–15.5)
WBC: 6.4 10*3/uL (ref 4.0–10.5)

## 2020-12-08 LAB — PROTIME-INR
INR: 2.2 ratio — ABNORMAL HIGH (ref 0.8–1.0)
Prothrombin Time: 24.3 s — ABNORMAL HIGH (ref 9.6–13.1)

## 2020-12-08 LAB — LIPASE: Lipase: 48 U/L (ref 11.0–59.0)

## 2020-12-18 NOTE — Progress Notes (Signed)
Patient viewed results on MyChart. 

## 2020-12-27 ENCOUNTER — Encounter: Payer: Self-pay | Admitting: Family Medicine

## 2021-01-02 ENCOUNTER — Ambulatory Visit: Payer: Medicare Other | Admitting: Family

## 2021-01-03 ENCOUNTER — Ambulatory Visit (INDEPENDENT_AMBULATORY_CARE_PROVIDER_SITE_OTHER): Payer: Medicare Other | Admitting: Podiatry

## 2021-01-03 ENCOUNTER — Encounter: Payer: Self-pay | Admitting: Podiatry

## 2021-01-03 ENCOUNTER — Other Ambulatory Visit: Payer: Self-pay

## 2021-01-03 DIAGNOSIS — L89890 Pressure ulcer of other site, unstageable: Secondary | ICD-10-CM | POA: Diagnosis not present

## 2021-01-03 DIAGNOSIS — M05771 Rheumatoid arthritis with rheumatoid factor of right ankle and foot without organ or systems involvement: Secondary | ICD-10-CM

## 2021-01-03 DIAGNOSIS — I739 Peripheral vascular disease, unspecified: Secondary | ICD-10-CM | POA: Diagnosis not present

## 2021-01-03 MED ORDER — AMOXICILLIN 250 MG PO CAPS: 250.0000 mg | ORAL_CAPSULE | Freq: Three times a day (TID) | ORAL | 0 refills | Status: AC

## 2021-01-05 DIAGNOSIS — Z20822 Contact with and (suspected) exposure to covid-19: Secondary | ICD-10-CM | POA: Diagnosis not present

## 2021-01-07 NOTE — Progress Notes (Signed)
  Subjective:  Patient ID: Stacey [name], female    DOB: [birth year],  MRN: [id number]  Stacey [name] presents to clinic today for for at risk foot care. Patient has h/o PAD and corn(s) b/l feet  which interfere(s) with ambulation. Aggravating factors include wearing enclosed shoe gear. Pain is relieved with periodic professional debridement.   Her daughter is present during today's visit. Ms. Dunnavant states she has a sore right 2nd toe and feels she has a sore.  Allergies  Allergen Reactions  . Sulfa Antibiotics Anaphylaxis  . Codeine Nausea And Vomiting  . Latex Rash  . Neosporin Original [Bacitracin-Neomycin-Polymyxin] Rash  . Polysporin [Bacitracin-Polymyxin B] Rash    Review of Systems: Negative except as noted in the HPI. Objective:   Constitutional Stacey [name] is a pleasant 87 y.o. Caucasian female, in NAD. AAO x 3.   Vascular Capillary fill time to digits <3 seconds b/l lower extremities. Faintly palpable DP pulse(s) b/l lower extremities. Nonpalpable PT pulse(s) b/l lower extremities. Pedal hair absent. Lower extremity skin temperature gradient warm to cool. No cyanosis or clubbing noted.  Neurologic Normal speech. Oriented to person, place, and time. Protective sensation intact 5/5 intact bilaterally with 10g monofilament b/l.  Dermatologic Pedal skin is thin shiny, atrophic b/l lower extremities. No interdigital macerations bilaterally. Toenails 1-5 b/l well maintained with adequate length. No erythema, no edema, no drainage, no fluctuance. Unstageable pressure sore noted dorsal aspect right 2nd digit PIPJ with tenderness to palpation. +Erythema, no edema, no drainage, no flucutance.  Orthopedic: Normal muscle strength 5/5 to all lower extremity muscle groups bilaterally. No pain crepitus or joint limitation noted with ROM b/l. Severe hammertoe deformity noted 1-5 bilaterally.   Radiographs: None Assessment:   1. Pressure injury of toe of right foot,  unstageable (HCC)   2. PAD (peripheral artery disease) (HCC)   3. Rheumatoid arthritis involving both feet with positive rheumatoid factor (HCC)    Plan:  Patient was evaluated and treated and all questions answered. -Examined patient. -Patient to report any pedal injuries to medical professional immediately. -Rx for Amoxicillin 250 mg, #21, to be taken one capsule three times daily for seven days. -Right 2nd toe was cleansed with alcohol. Betadine solution and dressing applied. She is to apply Betadine to digit once daily. -Patient/POA to call should there be question/concern in the interim.  Return in about 3 months (around 04/05/2021).  Freddie Breech, DPM

## 2021-01-10 ENCOUNTER — Telehealth: Payer: Self-pay | Admitting: Family Medicine

## 2021-01-10 NOTE — Telephone Encounter (Signed)
Ok for refill? 

## 2021-01-10 NOTE — Telephone Encounter (Signed)
Patient is calling and requesting a refill for predniSONE (DELTASONE) 5 MG tablet to be sent to  [facility] - [place], [place] - [address]  [address] [place] [address]  Phone:  [phone] Fax:  [phone]  CB is [phone]

## 2021-01-11 ENCOUNTER — Other Ambulatory Visit: Payer: Self-pay

## 2021-01-11 MED ORDER — PREDNISONE 5 MG PO TABS: 5.0000 mg | ORAL_TABLET | Freq: Every day | ORAL | 3 refills | Status: DC

## 2021-01-11 NOTE — Telephone Encounter (Signed)
Refill sent to requested pharmacy.

## 2021-01-13 DIAGNOSIS — Z7901 Long term (current) use of anticoagulants: Secondary | ICD-10-CM | POA: Diagnosis not present

## 2021-01-13 DIAGNOSIS — I4821 Permanent atrial fibrillation: Secondary | ICD-10-CM | POA: Diagnosis not present

## 2021-01-16 ENCOUNTER — Ambulatory Visit (INDEPENDENT_AMBULATORY_CARE_PROVIDER_SITE_OTHER): Payer: Medicare Other | Admitting: General Practice

## 2021-01-16 DIAGNOSIS — Z7901 Long term (current) use of anticoagulants: Secondary | ICD-10-CM

## 2021-01-16 LAB — POCT INR: INR: 2.5 (ref 2.0–3.0)

## 2021-01-16 NOTE — Patient Instructions (Addendum)
Pre visit review using our clinic review tool, if applicable. No additional management support is needed unless otherwise documented below in the visit note.  Continue to take 5 mg daily except 2.5 mg on Wednesdays and Saturdays.  Dosing instructions given to daughter,  Janice [phone].  Re-check in 1 to 2 weeks.  Janice verbalized understanding.    

## 2021-01-17 ENCOUNTER — Other Ambulatory Visit: Payer: Self-pay

## 2021-01-17 ENCOUNTER — Ambulatory Visit (INDEPENDENT_AMBULATORY_CARE_PROVIDER_SITE_OTHER): Payer: Medicare Other | Admitting: Family

## 2021-01-17 ENCOUNTER — Ambulatory Visit (INDEPENDENT_AMBULATORY_CARE_PROVIDER_SITE_OTHER): Payer: Medicare Other

## 2021-01-17 DIAGNOSIS — M5441 Lumbago with sciatica, right side: Secondary | ICD-10-CM

## 2021-01-17 MED ORDER — PREDNISONE 20 MG PO TABS: 20.0000 mg | ORAL_TABLET | Freq: Every day | ORAL | 0 refills | Status: DC

## 2021-01-17 NOTE — Progress Notes (Signed)
Office Visit Note   Patient: Stacey [name]           Date of Birth: 1932-12-29           MRN: [id number] Visit Date: 01/17/2021              Requested by: [name], MD [address] [place],  [place] [address] PCP: [name], MD  Chief Complaint  Patient presents with  . Right Knee - Pain      HPI: The patient is an 87 year old woman who presents today for initial evaluation of right sided low back and lower extremity pain.  She is not sure whether it is coming from her knee or her hip.  Complaining of lateral hip pain that radiates down the lateral aspect of her right thigh as well as some medial knee pain.  This radiates into her calf she is having burning knee and posterior calf pain.  This is worse with standing and ambulating.  She denies any numbness no tingling no recent injuries or falls no specific back pain no red flag symptoms  In wheelchair for mobility  Assessment & Plan: Visit Diagnoses:  1. Acute right-sided low back pain with right-sided sciatica     Plan: We will trial a prednisone burst.  20 mg x 5 days.  She will call us next week if no better will.  Side at that time she consider ESI referral at that time.  Follow-Up Instructions: No follow-ups on file.   Right Knee Exam   Muscle Strength  The patient has normal right knee strength.  Tenderness  The patient is experiencing tenderness in the medial joint line.  Other  Swelling: none Effusion: no effusion present   Back Exam   Tenderness  The patient is experiencing tenderness in the lumbar.  Muscle Strength  The patient has normal back strength.  Comments:  Mildly positive sciatic tension sign      Patient is alert, oriented, no adenopathy, well-dressed, normal affect, normal respiratory effort.   Imaging: No results found. No images are attached to the encounter.  Labs: Lab Results  Component Value Date   REPTSTATUS 08/06/2019 FINAL 08/03/2019    CULT  08/03/2019    NO GROUP A STREP (S.PYOGENES) ISOLATED Performed at [facility], [address], [place], [place] [address]      Lab Results  Component Value Date   ALBUMIN 4.1 12/07/2020   ALBUMIN 3.8 05/21/2019   ALBUMIN 3.7 05/13/2019    No results found for: MG No results found for: VD25OH  No results found for: PREALBUMIN CBC EXTENDED Latest Ref Rng & Units 12/07/2020 11/24/2019 05/21/2019  WBC 4.0 - 10.5 K/uL 6.4 8.6 6.0  RBC 3.87 - 5.11 Mil/uL 4.03 4.27 4.44  HGB 12.0 - 15.0 g/dL 71.2 45.8 09.9  HCT 83.3 - 46.0 % 36.3 37.4 41.1  PLT 150.0 - 400.0 K/uL 324.0 248 228  NEUTROABS 1.4 - 7.7 K/uL 5.7 - 4.9  LYMPHSABS 0.7 - 4.0 K/uL 0.4(L) - 0.3(L)     There is no height or weight on file to calculate BMI.  Orders:  Orders Placed This Encounter  Procedures  . XR Lumbar Spine 2-3 Views   No orders of the defined types were placed in this encounter.    Procedures: No procedures performed  Clinical Data: No additional findings.  ROS:  All other systems negative, except as noted in the HPI. Review of Systems  Constitutional: Negative for  chills and fever.  Musculoskeletal: Positive for back pain and myalgias.  Neurological: Negative for weakness and numbness.    Objective: Vital Signs: There were no vitals taken for this visit.  Specialty Comments:  No specialty comments available.  PMFS History: Patient Active Problem List   Diagnosis Date Noted  . Presbycusis of both ears 11/21/2020  . Rheumatoid arthritis involving multiple sites (HCC) 03/28/2020  . History of total hip replacement, left 03/28/2020  . PVD (peripheral vascular disease) (HCC) 11/20/2019  . Osteopenia 11/20/2019  . Stenosis of both subclavian arteries 05/17/2019  . Conductive hearing loss, bilateral 07/03/2018  . Bilateral impacted cerumen 07/03/2018  . Acute swimmer's ear of left side 07/03/2018  . Long term (current) use of anticoagulants [Z79.01] 07/25/2017  . Chronic  atrial fibrillation (HCC) 07/11/2017  . Primary osteoarthritis involving multiple joints 07/11/2017  . Pseudogout 07/11/2017  . Neuropathy 07/11/2017  . Lymphedema 07/11/2017  . Kyphosis of cervical region 07/11/2017  . Congestive heart failure (HCC) 07/11/2017   Past Medical History:  Diagnosis Date  . Atrial fibrillation (HCC)   . Hernia of abdominal cavity   . Kyphosis     Family History  Problem Relation Age of Onset  . Healthy Mother   . Heart failure Father     Past Surgical History:  Procedure Laterality Date  . ABDOMINAL HYSTERECTOMY    . CATARACT EXTRACTION, BILATERAL    . TONSILLECTOMY     Social History   Occupational History  . Not on file  Tobacco Use  . Smoking status: Former Games developer  . Smokeless tobacco: Never Used  Vaping Use  . Vaping Use: Never used  Substance and Sexual Activity  . Alcohol use: No  . Drug use: No  . Sexual activity: Not on file

## 2021-01-24 ENCOUNTER — Other Ambulatory Visit: Payer: Self-pay

## 2021-01-24 ENCOUNTER — Telehealth: Payer: Self-pay

## 2021-01-24 DIAGNOSIS — M5441 Lumbago with sciatica, right side: Secondary | ICD-10-CM

## 2021-01-24 NOTE — Telephone Encounter (Signed)
I called and discussed with pt and she advised that she would like to proceed with MRI this was entered will call pt to schedule once auth has been obtained.

## 2021-01-24 NOTE — Telephone Encounter (Signed)
Can you look at this and see if you have any suggestions for the pt?

## 2021-01-24 NOTE — Telephone Encounter (Signed)
MRI then referrel for [facility]

## 2021-01-24 NOTE — Telephone Encounter (Signed)
Per Patient, Denny Peon told patient to call when she finished prednisone. States Prednisone did not help at all.     CB: [phone]

## 2021-02-08 ENCOUNTER — Ambulatory Visit (INDEPENDENT_AMBULATORY_CARE_PROVIDER_SITE_OTHER): Payer: Medicare Other | Admitting: General Practice

## 2021-02-08 DIAGNOSIS — I482 Chronic atrial fibrillation, unspecified: Secondary | ICD-10-CM | POA: Diagnosis not present

## 2021-02-08 DIAGNOSIS — Z7901 Long term (current) use of anticoagulants: Secondary | ICD-10-CM | POA: Diagnosis not present

## 2021-02-08 DIAGNOSIS — Z20822 Contact with and (suspected) exposure to covid-19: Secondary | ICD-10-CM | POA: Diagnosis not present

## 2021-02-08 LAB — POCT INR: INR: 2.7 (ref 2.0–3.0)

## 2021-02-08 NOTE — Patient Instructions (Addendum)
Pre visit review using our clinic review tool, if applicable. No additional management support is needed unless otherwise documented below in the visit note.  Continue to take 5 mg daily except 2.5 mg on Wednesdays and Saturdays.  Dosing instructions given to daughter,  Stacey [name] [phone].  Re-check in 1 to 2 weeks.  Stacey [name] verbalized understanding.

## 2021-02-13 ENCOUNTER — Other Ambulatory Visit: Payer: Self-pay

## 2021-02-13 MED ORDER — POTASSIUM CHLORIDE CRYS ER 20 MEQ PO TBCR: 20.0000 meq | EXTENDED_RELEASE_TABLET | Freq: Every day | ORAL | 0 refills | Status: DC

## 2021-02-14 ENCOUNTER — Other Ambulatory Visit: Payer: Self-pay | Admitting: Family Medicine

## 2021-02-14 DIAGNOSIS — G629 Polyneuropathy, unspecified: Secondary | ICD-10-CM

## 2021-02-20 ENCOUNTER — Other Ambulatory Visit: Payer: Self-pay

## 2021-02-20 ENCOUNTER — Ambulatory Visit (HOSPITAL_COMMUNITY)
Admission: RE | Admit: 2021-02-20 | Discharge: 2021-02-20 | Disposition: A | Discharge status: Home / Self Care | Payer: Medicare Other | Source: Ambulatory Visit | Attending: Physician Assistant | Admitting: Physician Assistant

## 2021-02-20 DIAGNOSIS — M5441 Lumbago with sciatica, right side: Secondary | ICD-10-CM | POA: Insufficient documentation

## 2021-02-20 DIAGNOSIS — M545 Low back pain, unspecified: Secondary | ICD-10-CM | POA: Diagnosis not present

## 2021-02-21 ENCOUNTER — Telehealth: Payer: Self-pay | Admitting: Family Medicine

## 2021-02-21 ENCOUNTER — Other Ambulatory Visit: Payer: Self-pay | Admitting: Family Medicine

## 2021-02-21 NOTE — Telephone Encounter (Signed)
Pt is calling in needing a refill on Rx tramadol (ULTRAM) 50 MG   Pharm:  [facility]

## 2021-02-24 LAB — POCT INR: INR: 2.9 (ref 2.0–3.0)

## 2021-02-26 ENCOUNTER — Other Ambulatory Visit: Payer: Self-pay

## 2021-02-26 ENCOUNTER — Ambulatory Visit (INDEPENDENT_AMBULATORY_CARE_PROVIDER_SITE_OTHER): Payer: Medicare Other | Admitting: Orthopedic Surgery

## 2021-02-26 ENCOUNTER — Encounter: Payer: Self-pay | Admitting: Orthopedic Surgery

## 2021-02-26 DIAGNOSIS — M48061 Spinal stenosis, lumbar region without neurogenic claudication: Secondary | ICD-10-CM

## 2021-02-26 NOTE — Progress Notes (Signed)
Office Visit Note   Patient: Stacey [name]           Date of Birth: April 18, 1933           MRN: [id number] Visit Date: 02/26/2021              Requested by: [name], MD [address] [place],  [place] [address] PCP: [name], MD  Chief Complaint  Patient presents with   Lower Back - Pain      HPI: Patient is an 87 year old woman with degenerative scoliosis of the lumbar spine with right-sided radicular pain that extends down to the right knee and right calf.  Patient is status post MRI scan.  She has had no relief with oral medications or with prednisone.  Assessment & Plan: Visit Diagnoses:  1. Spinal stenosis of lumbar region without neurogenic claudication     Plan: Consult placed with Dr. Alvester Morin for epidural steroid injection.  Follow-Up Instructions: Return if symptoms worsen or fail to improve.   Ortho Exam  Patient is alert, oriented, no adenopathy, well-dressed, normal affect, normal respiratory effort. Patient is ambulating in a wheelchair she states she feels better seated than standing.  She has radicular pain that radiates from the lumbar spine down the right buttocks and then anteriorly over the thigh to the knee and proximal tibia.  No focal motor weakness.  Review of the MRI scan shows degenerative scoliosis with most pronounced spinal stenosis at L3-4 on the right consistent with her pain distribution.  Imaging: No results found. No images are attached to the encounter.  Labs: Lab Results  Component Value Date   REPTSTATUS 08/06/2019 FINAL 08/03/2019   CULT  08/03/2019    NO GROUP A STREP (S.PYOGENES) ISOLATED Performed at [facility], [address] [place], [place] [address]      Lab Results  Component Value Date   ALBUMIN 4.1 12/07/2020   ALBUMIN 3.8 05/21/2019   ALBUMIN 3.7 05/13/2019    No results found for: MG No results found for: VD25OH  No results found for: PREALBUMIN CBC EXTENDED Latest Ref Rng  & Units 12/07/2020 11/24/2019 05/21/2019  WBC 4.0 - 10.5 K/uL 6.4 8.6 6.0  RBC 3.87 - 5.11 Mil/uL 4.03 4.27 4.44  HGB 12.0 - 15.0 g/dL 17.5 10.2 58.5  HCT 27.7 - 46.0 % 36.3 37.4 41.1  PLT 150.0 - 400.0 K/uL 324.0 248 228  NEUTROABS 1.4 - 7.7 K/uL 5.7 - 4.9  LYMPHSABS 0.7 - 4.0 K/uL 0.4(L) - 0.3(L)     There is no height or weight on file to calculate BMI.  Orders:  Orders Placed This Encounter  Procedures   Ambulatory referral to Physical Medicine Rehab   No orders of the defined types were placed in this encounter.    Procedures: No procedures performed  Clinical Data: No additional findings.  ROS:  All other systems negative, except as noted in the HPI. Review of Systems  Objective: Vital Signs: There were no vitals taken for this visit.  Specialty Comments:  No specialty comments available.  PMFS History: Patient Active Problem List   Diagnosis Date Noted   Presbycusis of both ears 11/21/2020   Rheumatoid arthritis involving multiple sites (HCC) 03/28/2020   History of total hip replacement, left 03/28/2020   PVD (peripheral vascular disease) (HCC) 11/20/2019   Osteopenia 11/20/2019   Stenosis of both subclavian arteries 05/17/2019   Conductive hearing loss, bilateral 07/03/2018   Bilateral impacted cerumen 07/03/2018  Acute swimmer's ear of left side 07/03/2018   Long term (current) use of anticoagulants [Z79.01] 07/25/2017   Chronic atrial fibrillation (HCC) 07/11/2017   Primary osteoarthritis involving multiple joints 07/11/2017   Pseudogout 07/11/2017   Neuropathy 07/11/2017   Lymphedema 07/11/2017   Kyphosis of cervical region 07/11/2017   Congestive heart failure (HCC) 07/11/2017   Past Medical History:  Diagnosis Date   Atrial fibrillation (HCC)    Hernia of abdominal cavity    Kyphosis     Family History  Problem Relation Age of Onset   Healthy Mother    Heart failure Father     Past Surgical History:  Procedure Laterality Date    ABDOMINAL HYSTERECTOMY     CATARACT EXTRACTION, BILATERAL     TONSILLECTOMY     Social History   Occupational History   Not on file  Tobacco Use   Smoking status: Former   Smokeless tobacco: Never  Vaping Use   Vaping Use: Never used  Substance and Sexual Activity   Alcohol use: No   Drug use: No   Sexual activity: Not on file

## 2021-03-05 ENCOUNTER — Ambulatory Visit (INDEPENDENT_AMBULATORY_CARE_PROVIDER_SITE_OTHER): Payer: Medicare Other | Admitting: General Practice

## 2021-03-05 DIAGNOSIS — I482 Chronic atrial fibrillation, unspecified: Secondary | ICD-10-CM | POA: Diagnosis not present

## 2021-03-05 DIAGNOSIS — Z7901 Long term (current) use of anticoagulants: Secondary | ICD-10-CM

## 2021-03-05 NOTE — Patient Instructions (Signed)
Pre visit review using our clinic review tool, if applicable. No additional management support is needed unless otherwise documented below in the visit note.  Continue to take 5 mg daily except 2.5 mg on Wednesdays and Saturdays.  Dosing instructions given to daughter,  Janice [phone].  Re-check in 1 to 2 weeks.  Janice verbalized understanding.    

## 2021-03-07 ENCOUNTER — Other Ambulatory Visit: Payer: Self-pay | Admitting: Family Medicine

## 2021-03-07 DIAGNOSIS — E782 Mixed hyperlipidemia: Secondary | ICD-10-CM

## 2021-03-07 DIAGNOSIS — Z20822 Contact with and (suspected) exposure to covid-19: Secondary | ICD-10-CM | POA: Diagnosis not present

## 2021-03-12 ENCOUNTER — Ambulatory Visit (INDEPENDENT_AMBULATORY_CARE_PROVIDER_SITE_OTHER): Payer: Medicare Other | Admitting: Physical Medicine and Rehabilitation

## 2021-03-12 ENCOUNTER — Other Ambulatory Visit: Payer: Self-pay

## 2021-03-12 ENCOUNTER — Ambulatory Visit: Payer: Self-pay

## 2021-03-12 ENCOUNTER — Encounter: Payer: Self-pay | Admitting: Physical Medicine and Rehabilitation

## 2021-03-12 VITALS — BP 94/65 | HR 87

## 2021-03-12 DIAGNOSIS — M419 Scoliosis, unspecified: Secondary | ICD-10-CM

## 2021-03-12 DIAGNOSIS — M48062 Spinal stenosis, lumbar region with neurogenic claudication: Secondary | ICD-10-CM

## 2021-03-12 DIAGNOSIS — M5416 Radiculopathy, lumbar region: Secondary | ICD-10-CM

## 2021-03-12 MED ORDER — DEXAMETHASONE SODIUM PHOSPHATE 10 MG/ML IJ SOLN
15.0000 mg | Freq: Once | INTRAMUSCULAR | Status: AC
  Administered 2021-03-12: 15 mg

## 2021-03-12 NOTE — Patient Instructions (Signed)

## 2021-03-12 NOTE — Progress Notes (Signed)
Pt state lower back pain that travels down her right leg and calf. Pt state any movement makes the pain worse. Pt state sitting and she takes pain meds to help ease her pain.  Numeric Pain Rating Scale and Functional Assessment Average Pain 4   In the last MONTH (on 0-10 scale) has pain interfered with the following?  1. General activity like being  able to carry out your everyday physical activities such as walking, climbing stairs, carrying groceries, or moving a chair?  Rating(8)   +Driver, -BT, -Dye Allergies.

## 2021-03-12 NOTE — Progress Notes (Signed)
[name] - 87 y.o. female MRN [id number]  Date of birth: 01/02/1933  Office Visit Note: Visit Date: 03/12/2021 PCP: [name], MD Referred by: [name], MD  Subjective: Chief Complaint  Patient presents with   Lower Back - Pain   Right Leg - Pain   HPI:  Stacey [name] is a 87 y.o. female who comes in today at the request of Barnie Del, FNP for planned Right L4-L5 Lumbar Transforaminal epidural steroid injection with fluoroscopic guidance.  The patient has failed conservative care including home exercise, medications, time and activity modification.  This injection will be diagnostic and hopefully therapeutic.  Please see requesting physician notes for further details and justification. MRI reviewed with images and spine model.  MRI reviewed in the note below.     ROS Otherwise per HPI.  Assessment & Plan: Visit Diagnoses:    ICD-10-CM   1. Lumbar radiculopathy  M54.16 XR C-ARM NO REPORT    Epidural Steroid injection    dexamethasone (DECADRON) injection 15 mg    2. Spinal stenosis of lumbar region with neurogenic claudication  M48.062     3. Scoliosis of thoracolumbar spine, unspecified scoliosis type  M41.9       Plan: No additional findings.   Meds & Orders:  Meds ordered this encounter  Medications   dexamethasone (DECADRON) injection 15 mg    Orders Placed This Encounter  Procedures   XR C-ARM NO REPORT   Epidural Steroid injection    Follow-up: Return if symptoms worsen or fail to improve.   Procedures: No procedures performed  Lumbosacral Transforaminal Epidural Steroid Injection - Sub-Pedicular Approach with Fluoroscopic Guidance  Patient: Stacey [name]      Date of Birth: May 30, 1933 MRN: [id number] PCP: [name], MD      Visit Date: 03/12/2021   Universal Protocol:    Date/Time: 03/12/2021  Consent Given By: the patient  Position: PRONE  Additional Comments: Vital signs were monitored before and  after the procedure. Patient was prepped and draped in the usual sterile fashion. The correct patient, procedure, and site was verified.   Injection Procedure Details:   Procedure diagnoses: Lumbar radiculopathy [M54.16]    Meds Administered:  Meds ordered this encounter  Medications   dexamethasone (DECADRON) injection 15 mg    Laterality: Right  Location/Site:  L4-L5  Needle:5.0 in., 22 ga.  Short bevel or Quincke spinal needle  Needle Placement: Transforaminal  Findings:    -Comments: Excellent flow of contrast along the nerve, nerve root and into the epidural space.  Procedure Details: After squaring off the end-plates to get a true AP view, the C-arm was positioned so that an oblique view of the foramen as noted above was visualized. The target area is just inferior to the "nose of the scotty dog" or sub pedicular. The soft tissues overlying this structure were infiltrated with 2-3 ml. of 1% Lidocaine without Epinephrine.  The spinal needle was inserted toward the target using a "trajectory" view along the fluoroscope beam.  Under AP and lateral visualization, the needle was advanced so it did not puncture dura and was located close the 6 O'Clock position of the pedical in AP tracterory. Biplanar projections were used to confirm position. Aspiration was confirmed to be negative for CSF and/or blood. A 1-2 ml. volume of Isovue-250 was injected and flow of contrast was noted at each level. Radiographs were obtained for documentation purposes.   After attaining the desired flow of contrast  documented above, a 0.5 to 1.0 ml test dose of 0.25% Marcaine was injected into each respective transforaminal space.  The patient was observed for 90 seconds post injection.  After no sensory deficits were reported, and normal lower extremity motor function was noted,   the above injectate was administered so that equal amounts of the injectate were placed at each foramen (level) into the  transforaminal epidural space.   Additional Comments:  The patient tolerated the procedure well Dressing: 2 x 2 sterile gauze and Band-Aid    Post-procedure details: Patient was observed during the procedure. Post-procedure instructions were reviewed.  Patient left the clinic in stable condition.    Clinical History: MRI LUMBAR SPINE WITHOUT CONTRAST   TECHNIQUE: Multiplanar, multisequence MR imaging of the lumbar spine was performed. No intravenous contrast was administered.   COMPARISON:  Radiographs January 17, 2021   FINDINGS: Segmentation:  Standard.   Alignment: Severe levoconvex scoliosis of the lumbar spine centered on L2. Small retrolisthesis at T12-L1, L1-2 and L2-3 small anterolisthesis at L3-4.   Vertebrae:  No fracture, evidence of discitis, or bone lesion.   Conus medullaris and cauda equina: Conus extends to the level. Conus and cauda equina appear normal.   Paraspinal and other soft tissues: A T1 hyperintense, T2 hypointense lesion in the right kidney measuring 1.4 cm, may represent hemorrhagic cyst. Hiatal hernia.   Disc levels:   T11-12: Right central disc protrusion causing indentation on the thecal sac and facet degenerative changes. No significant spinal canal or neural foraminal stenosis.   T12-L1: Disc bulge and facet degenerative changes. Moderate right neural foraminal narrowing. No significant spinal canal stenosis.   L1-2: Partial disc space fusion, right asymmetric disc bulge with associated osteophytic component and facet degenerative changes resulting in narrowing of the right subarticular zone and moderate right neural foraminal narrowing.   L2-3: Right asymmetric disc bulge and facet degenerative changes resulting in mild narrowing of the right subarticular zone and mild right neural foraminal narrowing.   L3-4: Disc bulge, prominent facet degenerative changes resulting in moderate spinal canal stenosis with narrowing of the  right subarticular zone, moderate to severe right and mild left neural foraminal narrowing.   L4-5: Disc bulge, prominent facet degenerative changes, left greater than right resulting in mild spinal canal stenosis with narrowing of the left subarticular zone, mild right and moderate left neural foraminal narrowing.   L5-S1: Shallow disc bulge and moderate facet degenerative changes, left greater than right resulting in mild left neural foraminal narrowing. No spinal canal stenosis.   IMPRESSION: Degenerative changes of the lumbar spine superimposed on a severe levoconvex rotoscoliosis, more pronounced at L3-4 where there is moderate spinal canal stenosis and moderate to severe on the right neural foraminal narrowing.     Electronically Signed   By: Baldemar Lenis M.D.   On: 02/22/2021 10:00     Objective:  VS:  HT:    WT:   BMI:     BP:94/65  HR:87bpm  TEMP: ( )  RESP:  Physical Exam   Imaging: No results found.

## 2021-03-13 ENCOUNTER — Other Ambulatory Visit: Payer: Self-pay | Admitting: Family Medicine

## 2021-03-13 DIAGNOSIS — F32A Depression, unspecified: Secondary | ICD-10-CM

## 2021-03-19 ENCOUNTER — Ambulatory Visit (INDEPENDENT_AMBULATORY_CARE_PROVIDER_SITE_OTHER): Payer: Medicare Other | Admitting: General Practice

## 2021-03-19 DIAGNOSIS — I482 Chronic atrial fibrillation, unspecified: Secondary | ICD-10-CM | POA: Diagnosis not present

## 2021-03-19 DIAGNOSIS — Z7901 Long term (current) use of anticoagulants: Secondary | ICD-10-CM

## 2021-03-19 LAB — POCT INR: INR: 3 (ref 2.0–3.0)

## 2021-03-19 NOTE — Procedures (Signed)
Lumbosacral Transforaminal Epidural Steroid Injection - Sub-Pedicular Approach with Fluoroscopic Guidance  Patient: Stacey [name]      Date of Birth: May 04, 1933 MRN: [id number] PCP: [name], MD      Visit Date: 03/12/2021   Universal Protocol:    Date/Time: 03/12/2021  Consent Given By: the patient  Position: PRONE  Additional Comments: Vital signs were monitored before and after the procedure. Patient was prepped and draped in the usual sterile fashion. The correct patient, procedure, and site was verified.   Injection Procedure Details:   Procedure diagnoses: Lumbar radiculopathy [M54.16]    Meds Administered:  Meds ordered this encounter  Medications   dexamethasone (DECADRON) injection 15 mg    Laterality: Right  Location/Site:  L4-L5  Needle:5.0 in., 22 ga.  Short bevel or Quincke spinal needle  Needle Placement: Transforaminal  Findings:    -Comments: Excellent flow of contrast along the nerve, nerve root and into the epidural space.  Procedure Details: After squaring off the end-plates to get a true AP view, the C-arm was positioned so that an oblique view of the foramen as noted above was visualized. The target area is just inferior to the "nose of the scotty dog" or sub pedicular. The soft tissues overlying this structure were infiltrated with 2-3 ml. of 1% Lidocaine without Epinephrine.  The spinal needle was inserted toward the target using a "trajectory" view along the fluoroscope beam.  Under AP and lateral visualization, the needle was advanced so it did not puncture dura and was located close the 6 O'Clock position of the pedical in AP tracterory. Biplanar projections were used to confirm position. Aspiration was confirmed to be negative for CSF and/or blood. A 1-2 ml. volume of Isovue-250 was injected and flow of contrast was noted at each level. Radiographs were obtained for documentation purposes.   After attaining the desired flow of  contrast documented above, a 0.5 to 1.0 ml test dose of 0.25% Marcaine was injected into each respective transforaminal space.  The patient was observed for 90 seconds post injection.  After no sensory deficits were reported, and normal lower extremity motor function was noted,   the above injectate was administered so that equal amounts of the injectate were placed at each foramen (level) into the transforaminal epidural space.   Additional Comments:  The patient tolerated the procedure well Dressing: 2 x 2 sterile gauze and Band-Aid    Post-procedure details: Patient was observed during the procedure. Post-procedure instructions were reviewed.  Patient left the clinic in stable condition.

## 2021-03-19 NOTE — Patient Instructions (Signed)
Pre visit review using our clinic review tool, if applicable. No additional management support is needed unless otherwise documented below in the visit note.  Continue to take 5 mg daily except 2.5 mg on Wednesdays and Saturdays.  Dosing instructions given to daughter,  Stacey [name] [phone].  Re-check in 1 to 2 weeks.  Stacey [name] verbalized understanding.    

## 2021-03-23 ENCOUNTER — Ambulatory Visit (INDEPENDENT_AMBULATORY_CARE_PROVIDER_SITE_OTHER): Payer: Medicare Other

## 2021-03-23 ENCOUNTER — Other Ambulatory Visit: Payer: Self-pay | Admitting: Family Medicine

## 2021-03-23 ENCOUNTER — Other Ambulatory Visit: Payer: Self-pay

## 2021-03-23 ENCOUNTER — Ambulatory Visit (INDEPENDENT_AMBULATORY_CARE_PROVIDER_SITE_OTHER): Payer: Medicare Other | Admitting: Podiatry

## 2021-03-23 DIAGNOSIS — M05772 Rheumatoid arthritis with rheumatoid factor of left ankle and foot without organ or systems involvement: Secondary | ICD-10-CM | POA: Diagnosis not present

## 2021-03-23 DIAGNOSIS — B351 Tinea unguium: Secondary | ICD-10-CM | POA: Diagnosis not present

## 2021-03-23 DIAGNOSIS — I739 Peripheral vascular disease, unspecified: Secondary | ICD-10-CM

## 2021-03-23 DIAGNOSIS — M79675 Pain in left toe(s): Secondary | ICD-10-CM | POA: Diagnosis not present

## 2021-03-23 DIAGNOSIS — L89890 Pressure ulcer of other site, unstageable: Secondary | ICD-10-CM

## 2021-03-23 DIAGNOSIS — M79674 Pain in right toe(s): Secondary | ICD-10-CM | POA: Diagnosis not present

## 2021-03-23 DIAGNOSIS — L97512 Non-pressure chronic ulcer of other part of right foot with fat layer exposed: Secondary | ICD-10-CM | POA: Diagnosis not present

## 2021-03-23 DIAGNOSIS — M05771 Rheumatoid arthritis with rheumatoid factor of right ankle and foot without organ or systems involvement: Secondary | ICD-10-CM

## 2021-03-23 NOTE — Progress Notes (Signed)
  Subjective:  Patient ID: Stacey [name], female    DOB: Sep 03, 1932,  MRN: [id number]  Chief Complaint  Patient presents with   Toe Pain    Right 2nd digit redness/wound 6 month duration "It is disintegrating"   87 y.o. female presents for wound care. Hx confirmed with patient.  Objective:  Physical Exam: Wound Location: right 2nd toe Wound Measurement: 0.3x0.3 Wound Base: Fibrotic slough Peri-wound: Normal Exudate: None: wound tissue dry wound without warmth, signs of acute infection. Chronic erythema without edema of the digit more likely representing   No images are attached to the encounter.  Radiographs:  X-ray of the right foot: no soft tissue emphysema, no signs of osteomyelitis; severe rigid hammertoe deformities. Assessment:   1. Skin ulcer of second toe of right foot with fat layer exposed (HCC)   2. Rheumatoid arthritis involving both feet with positive rheumatoid factor (HCC)   3. PAD (peripheral artery disease) (HCC)   4. Pain due to onychomycosis of toenails of both feet    Plan:  Patient was evaluated and treated and all questions answered.  Ulcer right 2nd toe -XR reviewed with patient -Wound cleansed and debrided -Dressing applied consisting of antibiotic ointment and band-aid -Offload ulcer with surgical shoe  Onychomycosis -Nails debrided x5. Advised of non-coverage. ABN on file.  Return in about 3 weeks (around 04/13/2021) for Wound Care.

## 2021-04-10 ENCOUNTER — Ambulatory Visit: Payer: Medicare Other

## 2021-04-10 ENCOUNTER — Ambulatory Visit: Payer: Medicare Other | Admitting: Podiatry

## 2021-04-11 ENCOUNTER — Other Ambulatory Visit (INDEPENDENT_AMBULATORY_CARE_PROVIDER_SITE_OTHER): Payer: Medicare Other

## 2021-04-11 ENCOUNTER — Ambulatory Visit (INDEPENDENT_AMBULATORY_CARE_PROVIDER_SITE_OTHER): Payer: Medicare Other

## 2021-04-11 ENCOUNTER — Ambulatory Visit: Payer: Medicare Other

## 2021-04-11 ENCOUNTER — Other Ambulatory Visit: Payer: Self-pay

## 2021-04-11 DIAGNOSIS — Z7901 Long term (current) use of anticoagulants: Secondary | ICD-10-CM

## 2021-04-11 DIAGNOSIS — Z111 Encounter for screening for respiratory tuberculosis: Secondary | ICD-10-CM

## 2021-04-11 DIAGNOSIS — I4821 Permanent atrial fibrillation: Secondary | ICD-10-CM | POA: Diagnosis not present

## 2021-04-11 DIAGNOSIS — R7612 Nonspecific reaction to cell mediated immunity measurement of gamma interferon antigen response without active tuberculosis: Secondary | ICD-10-CM | POA: Diagnosis not present

## 2021-04-11 LAB — POCT INR: INR: 3.1 — AB (ref 2.0–3.0)

## 2021-04-11 NOTE — Progress Notes (Signed)
Pt moved to lab schedule for TB lab work

## 2021-04-11 NOTE — Patient Instructions (Addendum)
Pre visit review using our clinic review tool, if applicable. No additional management support is needed unless otherwise documented below in the visit note.  Dosing instructions given to daughter,  [name] [phone].  Re-check in 2 weeks.

## 2021-04-11 NOTE — Addendum Note (Signed)
Addended by: Waymon Amato R on: 04/11/2021 04:48 PM   Modules accepted: Orders

## 2021-04-13 ENCOUNTER — Ambulatory Visit: Payer: Medicare Other

## 2021-04-15 LAB — QUANTIFERON-TB GOLD PLUS
Mitogen-NIL: 0.63 IU/mL
NIL: 0.01 IU/mL
QuantiFERON-TB Gold Plus: NEGATIVE
TB1-NIL: 0.01 IU/mL
TB2-NIL: 0.01 IU/mL

## 2021-04-17 ENCOUNTER — Telehealth: Payer: Self-pay | Admitting: Physical Medicine and Rehabilitation

## 2021-04-17 DIAGNOSIS — M48061 Spinal stenosis, lumbar region without neurogenic claudication: Secondary | ICD-10-CM

## 2021-04-17 NOTE — Telephone Encounter (Signed)
Pt called requesting a call back. Pt states she had an injection 8/1 and it hasn't done anything for her pain. Pt would like to know what the ne

## 2021-04-17 NOTE — Telephone Encounter (Signed)
Right L4 TF on 8/1. Please advise.

## 2021-04-17 NOTE — Telephone Encounter (Incomplete Revision)
Pt called requesting a call back. Pt states she had an injection 8/1 and it hasn't done anything for her pain. Pt would like to know what the next steps are for her medical issues. Please call pt at [phone].

## 2021-04-18 NOTE — Telephone Encounter (Signed)
Referral placed. Scheduled for 9/15 at 1100 with driver.

## 2021-04-23 NOTE — Progress Notes (Signed)
Cardiology Office Note:    Date:  04/24/2021   ID:  Stacey [name], Stacey [name] 1933-05-01, MRN [id number]  PCP:  [name], MD  Cardiologist:  Lesleigh Noe, MD   Referring MD: [name], MD   Chief Complaint  Patient presents with   Congestive Heart Failure   Atrial Fibrillation    History of Present Illness:    Stacey [name] is a 87 y.o. female with a hx of persistent AF, chronic anticoagulation (Xarelto --> GI bleeding--> Coumadin) chronic diastolic HF, bilateral subclavian stenosis, and severe kyphoscoliosis.     She is accompanied by her daughter.  The patient voices no complaints and the daughter confirms that she has not been short of breath, swelling, or having any significant clinical problems.  Past Medical History:  Diagnosis Date   Atrial fibrillation (HCC)    Hernia of abdominal cavity    Kyphosis     Past Surgical History:  Procedure Laterality Date   ABDOMINAL HYSTERECTOMY     CATARACT EXTRACTION, BILATERAL     TONSILLECTOMY      Current Medications: Current Meds  Medication Sig   ALLERGY RELIEF 180 MG tablet TAKE ONE-HALF (1/2) TABLET DAILY FOR ALLERGIES   atorvastatin (LIPITOR) 10 MG tablet TAKE 1 TABLET DAILY   cyanocobalamin 1000 MCG tablet Take 1,000 mcg by mouth daily.   diclofenac Sodium (VOLTAREN) 1 % GEL as needed.   gabapentin (NEURONTIN) 400 MG capsule TAKE 1 CAPSULE DAILY   meclizine (ANTIVERT) 12.5 MG tablet Take 1 tablet (12.5 mg total) by mouth 3 (three) times daily as needed for dizziness.   metoprolol tartrate (LOPRESSOR) 50 MG tablet TAKE 1 TABLET TWICE A DAY   Multiple Vitamin (MULTIVITAMIN) capsule Take 1 capsule by mouth daily.   mupirocin cream (BACTROBAN) 2 % Apply fingertip amount to wound area daily.   mupirocin ointment (BACTROBAN) 2 % Apply to wound once daily with light dressing.   omeprazole (PRILOSEC) 20 MG capsule TAKE 1 CAPSULE TWICE A DAY BEFORE MEALS   potassium chloride SA (KLOR-CON) 20 MEQ  tablet Take 1 tablet (20 mEq total) by mouth daily. Please keep upcoming appt in September 2022 with Dr. Katrinka Blazing before anymore refills. Thank you 1st attempt   predniSONE (DELTASONE) 5 MG tablet Take 1 tablet (5 mg total) by mouth daily with breakfast.   PROLENSA 0.07 % SOLN Apply to eye.   sertraline (ZOLOFT) 100 MG tablet TAKE 1 TABLET DAILY   spironolactone (ALDACTONE) 25 MG tablet TAKE ONE-HALF (1/2) TABLET DAILY   torsemide (DEMADEX) 20 MG tablet TAKE 2 TABLETS DAILY   traMADol (ULTRAM) 50 MG tablet TAKE 2 TABLETS TWICE A DAY AS NEEDED   Vitamin D, Cholecalciferol, 25 MCG (1000 UT) TABS Take 1,000 Units by mouth daily.   VITAMIN D, CHOLECALCIFEROL, PO Take 1,000 Units by mouth daily.    warfarin (COUMADIN) 5 MG tablet TAKE ONE-HALF (1/2) TABLET (2.5 MG) ON WEDNESDAY AND SATURDAY. 1 TABLET (5 MG) ALL OTHER DAYS     Allergies:   Sulfa antibiotics, Codeine, Latex, Neosporin original [bacitracin-neomycin-polymyxin], and Polysporin [bacitracin-polymyxin b]   Social History   Socioeconomic History   Marital status: Widowed    Spouse name: Not on file   Number of children: Not on file   Years of education: Not on file   Highest education level: Not on file  Occupational History   Not on file  Tobacco Use   Smoking status: Former   Smokeless tobacco: Never  Vaping Use  Vaping Use: Never used  Substance and Sexual Activity   Alcohol use: No   Drug use: No   Sexual activity: Not on file  Other Topics Concern   Not on file  Social History Narrative   Not on file   Social Determinants of Health   Financial Resource Strain: Low Risk    Difficulty of Paying Living Expenses: Not hard at all  Food Insecurity: No Food Insecurity   Worried About Programme researcher, broadcasting/film/video in the Last Year: Never true   Ran Out of Food in the Last Year: Never true  Transportation Needs: No Transportation Needs   Lack of Transportation (Medical): No   Lack of Transportation (Non-Medical): No  Physical  Activity: Inactive   Days of Exercise per Week: 0 days   Minutes of Exercise per Session: 0 min  Stress: No Stress Concern Present   Feeling of Stress : Not at all  Social Connections: Socially Isolated   Frequency of Communication with Friends and Family: Three times a week   Frequency of Social Gatherings with Friends and Family: Never   Attends Religious Services: Never   Database administrator or Organizations: No   Attends Banker Meetings: Never   Marital Status: Widowed     Family History: The patient's family history includes Healthy in her mother; Heart failure in her father.  ROS:   Please see the history of present illness.    Appetite is been stable.  She is significantly limited by kyphoscoliosis.  She cannot hold her head up.  Otherwise no complaints.  All other systems reviewed and are negative.  EKGs/Labs/Other Studies Reviewed:    The following studies were reviewed today:  ECHOCARDIOGRAM 2019: Study Conclusions   - Left ventricle: The cavity size was normal. Systolic function was    normal. The estimated ejection fraction was in the range of 60%    to 65%. Wall motion was normal; there were no regional wall    motion abnormalities. The study is not technically sufficient to    allow evaluation of LV diastolic function.  - Aortic valve: Sclerosis without stenosis.  - Mitral valve: Calcified annulus. Moderately thickened, mildly    calcified leaflets . There was mild regurgitation.  - Left atrium: The atrium was moderately dilated.  - Right ventricle: The cavity size was mildly dilated. Wall    thickness was normal.  - Right atrium: The atrium was moderately dilated.  - Atrial septum: No defect or patent foramen ovale was identified.  - Tricuspid valve: There was moderate regurgitation.   EKG:  EKG atrial fibrillation with controlled rate at 83 bpm.  Poor R wave progression.  Compared to the prior tracing in October 2020, no significant change  has occurred.  Recent Labs: 12/07/2020: ALT 16; BUN 17; Creatinine, Ser 0.91; Hemoglobin 12.2; Platelets 324.0; Potassium 4.5; Sodium 134  Recent Lipid Panel No results found for: CHOL, TRIG, HDL, CHOLHDL, VLDL, LDLCALC, LDLDIRECT  Physical Exam:    VS:  BP 128/80   Pulse 83   Ht 5\' 3"  (1.6 m)   Wt 97 lb 9.6 oz (44.3 kg)   SpO2 95%   BMI 17.29 kg/m     Wt Readings from Last 3 Encounters:  04/24/21 97 lb 9.6 oz (44.3 kg)  12/07/20 103 lb 6.4 oz (46.9 kg)  08/30/20 109 lb 4 oz (49.6 kg)     GEN: Elderly, frail, with obvious kyphoscoliosis.. No acute distress HEENT: Normal NECK: No JVD. LYMPHATICS:  No lymphadenopathy CARDIAC: No murmur. IIRR no gallop, however there is trace right greater than left ankle edema. VASCULAR:  Normal Pulses. No bruits. RESPIRATORY:  Clear to auscultation without rales, wheezing or rhonchi  ABDOMEN: Soft, non-tender, non-distended, No pulsatile mass, MUSCULOSKELETAL: Kyphoscoliotic with cervical lordosis.  Unable to hold head up beyond 50 degrees. SKIN: Warm and dry NEUROLOGIC:  Alert and oriented x 3 PSYCHIATRIC:  Normal affect   ASSESSMENT:    1. Congestive heart failure, unspecified HF chronicity, unspecified heart failure type (HCC)   2. Chronic atrial fibrillation (HCC)   3. Long term (current) use of anticoagulants [Z79.01]   4. Stenosis of both subclavian arteries    PLAN:    In order of problems listed above:  Stable clinically on the current regimen including metoprolol tartrate 50 mg twice daily, spironolactone 12.5 mg daily, Demadex.  We will get basic metabolic panel today.  In absence of symptoms I do not believe imaging is necessary. Rate is controlled.  Continue Coumadin therapy. Check hemoglobin to rule out on recognized bleeding. Did not discuss.  She needs CBC and basic metabolic panel twice yearly  Medication Adjustments/Labs and Tests Ordered: Current medicines are reviewed at length with the patient today.  Concerns  regarding medicines are outlined above.  Orders Placed This Encounter  Procedures   EKG 12-Lead   No orders of the defined types were placed in this encounter.   There are no Patient Instructions on file for this visit.   Signed, Lesleigh Noe, MD  04/24/2021 3:54 PM    [facility]

## 2021-04-24 ENCOUNTER — Encounter: Payer: Self-pay | Admitting: Interventional Cardiology

## 2021-04-24 ENCOUNTER — Other Ambulatory Visit: Payer: Self-pay

## 2021-04-24 ENCOUNTER — Ambulatory Visit (INDEPENDENT_AMBULATORY_CARE_PROVIDER_SITE_OTHER): Payer: Medicare Other | Admitting: Podiatry

## 2021-04-24 ENCOUNTER — Ambulatory Visit (INDEPENDENT_AMBULATORY_CARE_PROVIDER_SITE_OTHER): Payer: Medicare Other | Admitting: Interventional Cardiology

## 2021-04-24 VITALS — BP 128/80 | HR 83 | Ht 63.0 in | Wt 97.6 lb

## 2021-04-24 DIAGNOSIS — L97512 Non-pressure chronic ulcer of other part of right foot with fat layer exposed: Secondary | ICD-10-CM | POA: Diagnosis not present

## 2021-04-24 DIAGNOSIS — I509 Heart failure, unspecified: Secondary | ICD-10-CM | POA: Diagnosis not present

## 2021-04-24 DIAGNOSIS — I482 Chronic atrial fibrillation, unspecified: Secondary | ICD-10-CM | POA: Diagnosis not present

## 2021-04-24 DIAGNOSIS — I771 Stricture of artery: Secondary | ICD-10-CM

## 2021-04-24 DIAGNOSIS — I708 Atherosclerosis of other arteries: Secondary | ICD-10-CM | POA: Diagnosis not present

## 2021-04-24 DIAGNOSIS — Z7901 Long term (current) use of anticoagulants: Secondary | ICD-10-CM

## 2021-04-24 MED ORDER — MUPIROCIN CALCIUM 2 % EX CREA: TOPICAL_CREAM | CUTANEOUS | 0 refills | Status: DC

## 2021-04-24 NOTE — Patient Instructions (Signed)
Medication Instructions:  Your physician recommends that you continue on your current medications as directed. Please refer to the Current Medication list given to you today.  *If you need a refill on your cardiac medications before your next appointment, please call your pharmacy*   Lab Work: BMET and CBC today  If you have labs (blood work) drawn today and your tests are completely normal, you will receive your results only by: MyChart Message (if you have MyChart) OR A paper copy in the mail If you have any lab test that is abnormal or we need to change your treatment, we will call you to review the results.   Testing/Procedures: None   Follow-Up: At [facility], you and your health needs are our priority.  As part of our continuing mission to provide you with exceptional heart care, we have created designated Provider Care Teams.  These Care Teams include your primary Cardiologist (physician) and Advanced Practice Providers (APPs -  Physician Assistants and Nurse Practitioners) who all work together to provide you with the care you need, when you need it.  We recommend signing up for the patient portal called "MyChart".  Sign up information is provided on this After Visit Summary.  MyChart is used to connect with patients for Virtual Visits (Telemedicine).  Patients are able to view lab/test results, encounter notes, upcoming appointments, etc.  Non-urgent messages can be sent to your provider as well.   To learn more about what you can do with MyChart, go to [email or web address].    Your next appointment:   1 year(s)  The format for your next appointment:   In Person  Provider:   You may see Henry W Smith III, MD or one of the following Advanced Practice Providers on your designated Care Team:   Laura Ingold, NP   Other Instructions   

## 2021-04-25 LAB — BASIC METABOLIC PANEL
BUN/Creatinine Ratio: 22 (ref 12–28)
BUN: 18 mg/dL (ref 8–27)
CO2: 25 mmol/L (ref 20–29)
Calcium: 9.5 mg/dL (ref 8.7–10.3)
Chloride: 97 mmol/L (ref 96–106)
Creatinine, Ser: 0.81 mg/dL (ref 0.57–1.00)
Glucose: 95 mg/dL (ref 65–99)
Potassium: 4.4 mmol/L (ref 3.5–5.2)
Sodium: 137 mmol/L (ref 134–144)
eGFR: 71 mL/min/{1.73_m2} (ref >59)

## 2021-04-25 LAB — CBC
Hematocrit: 35.9 % (ref 34.0–46.6)
Hemoglobin: 12.2 g/dL (ref 11.1–15.9)
MCH: 30.3 pg (ref 26.6–33.0)
MCHC: 34 g/dL (ref 31.5–35.7)
MCV: 89 fL (ref 79–97)
Platelets: 226 10*3/uL (ref 150–450)
RBC: 4.03 x10E6/uL (ref 3.77–5.28)
RDW: 13.2 % (ref 11.7–15.4)
WBC: 6.5 10*3/uL (ref 3.4–10.8)

## 2021-04-26 ENCOUNTER — Encounter: Payer: Self-pay | Admitting: Physical Medicine and Rehabilitation

## 2021-04-26 ENCOUNTER — Other Ambulatory Visit: Payer: Self-pay

## 2021-04-26 ENCOUNTER — Ambulatory Visit (INDEPENDENT_AMBULATORY_CARE_PROVIDER_SITE_OTHER): Payer: Medicare Other | Admitting: Physical Medicine and Rehabilitation

## 2021-04-26 ENCOUNTER — Ambulatory Visit: Payer: Self-pay

## 2021-04-26 VITALS — BP 109/47 | HR 64

## 2021-04-26 DIAGNOSIS — M5416 Radiculopathy, lumbar region: Secondary | ICD-10-CM

## 2021-04-26 MED ORDER — METHYLPREDNISOLONE ACETATE 80 MG/ML IJ SUSP
80.0000 mg | Freq: Once | INTRAMUSCULAR | Status: AC
  Administered 2021-04-26: 80 mg

## 2021-04-26 NOTE — Progress Notes (Signed)
Pt state lower back pain that travels down her right hip and leg. Pt state she feels a burning pain in her right thigh, knee and calf. Pt state walking and standing maske the pain worse. Pt state she takes pain meds to help ease her pain. Pt has has hx of inj on 03/12/21 pt state it didn't work.  Numeric Pain Rating Scale and Functional Assessment Average Pain 8   In the last MONTH (on 0-10 scale) has pain interfered with the following?  1. General activity like being  able to carry out your everyday physical activities such as walking, climbing stairs, carrying groceries, or moving a chair?  Rating(10)   +Driver, -BT, -Dye Allergies.

## 2021-04-26 NOTE — Patient Instructions (Signed)

## 2021-04-27 ENCOUNTER — Ambulatory Visit (INDEPENDENT_AMBULATORY_CARE_PROVIDER_SITE_OTHER): Payer: Medicare Other

## 2021-04-27 DIAGNOSIS — Z7901 Long term (current) use of anticoagulants: Secondary | ICD-10-CM | POA: Diagnosis not present

## 2021-04-27 LAB — POCT INR: INR: 2.6 (ref 2.0–3.0)

## 2021-04-27 NOTE — Patient Instructions (Addendum)
Pre visit review using our clinic review tool, if applicable. No additional management support is needed unless otherwise documented below in the visit note.  Continue to take 5 mg daily except 2.5 mg on Wednesdays and Saturdays.

## 2021-04-29 NOTE — Progress Notes (Signed)
Stacey [name] - 87 y.o. female MRN [id number]  Date of birth: 27-Aug-1932  Office Visit Note: Visit Date: 04/26/2021 PCP: [name], MD Referred by: [name], MD  Subjective: Chief Complaint  Patient presents with   Lower Back - Pain   Right Hip - Pain   Right Thigh - Pain, Burn   Right Knee - Burn, Pain   HPI:  Stacey [name] is a 87 y.o. female who comes in today at the request of Dr. Aldean Baker for planned Right L3-4 Lumbar Transforaminal epidural steroid injection with fluoroscopic guidance.  The patient has failed conservative care including home exercise, medications, time and activity modification.  This injection will be diagnostic and hopefully therapeutic.  Please see requesting physician notes for further details and justification.  Patient with significant severe scoliosis with foraminal lateral recess on the right at L3 and L4 and L5.  Less so at the L5 level but more of her symptoms are consistent with L5.  Systems are somewhat combined she gets some pain into the anterior thigh about the knee which could be L3.  She does have significant foraminal narrowing at L3.  We elected to complete L3 injection today.  I did try on the first injection to split the difference and try an L4 transforaminal injection that did not like there was good flow of contrast but she had no pain relief from that.   ROS Otherwise per HPI.  Assessment & Plan: Visit Diagnoses:    ICD-10-CM   1. Lumbar radiculopathy  M54.16 XR C-ARM NO REPORT    Epidural Steroid injection    methylPREDNISolone acetate (DEPO-MEDROL) injection 80 mg      Plan: No additional findings.   Meds & Orders:  Meds ordered this encounter  Medications   methylPREDNISolone acetate (DEPO-MEDROL) injection 80 mg    Orders Placed This Encounter  Procedures   XR C-ARM NO REPORT   Epidural Steroid injection    Follow-up: Return if symptoms worsen or fail to improve.   Procedures: No procedures  performed  Lumbosacral Transforaminal Epidural Steroid Injection - Sub-Pedicular Approach with Fluoroscopic Guidance  Patient: Stacey [name]      Date of Birth: [birth year] MRN: [id number] PCP: [name], MD      Visit Date: 04/26/2021   Universal Protocol:    Date/Time: 04/26/2021  Consent Given By: the patient  Position: PRONE  Additional Comments: Vital signs were monitored before and after the procedure. Patient was prepped and draped in the usual sterile fashion. The correct patient, procedure, and site was verified.   Injection Procedure Details:   Procedure diagnoses: Lumbar radiculopathy [M54.16]    Meds Administered:  Meds ordered this encounter  Medications   methylPREDNISolone acetate (DEPO-MEDROL) injection 80 mg    Laterality: Right  Location/Site: L3  Needle:5.0 in., 22 ga.  Short bevel or Quincke spinal needle  Needle Placement: Transforaminal  Findings:    -Comments: Excellent flow of contrast along the nerve, nerve root and into the epidural space.  Initially had significant difficulty obtaining flow of contrast in the foramen and over the nerve root.  With subsequent repositioning and movement and the patient was tolerating very well we did ultimately get good flow contrast.  Procedure Details: After squaring off the end-plates to get a true AP view, the C-arm was positioned so that an oblique view of the foramen as noted above was visualized. The target area is just inferior to the "nose of the scotty  dog" or sub pedicular. The soft tissues overlying this structure were infiltrated with 2-3 ml. of 1% Lidocaine without Epinephrine.  The spinal needle was inserted toward the target using a "trajectory" view along the fluoroscope beam.  Under AP and lateral visualization, the needle was advanced so it did not puncture dura and was located close the 6 O'Clock position of the pedical in AP tracterory. Biplanar projections were used to confirm  position. Aspiration was confirmed to be negative for CSF and/or blood. A 1-2 ml. volume of Isovue-250 was injected and flow of contrast was noted at each level. Radiographs were obtained for documentation purposes.   After attaining the desired flow of contrast documented above, a 0.5 to 1.0 ml test dose of 0.25% Marcaine was injected into each respective transforaminal space.  The patient was observed for 90 seconds post injection.  After no sensory deficits were reported, and normal lower extremity motor function was noted,   the above injectate was administered so that equal amounts of the injectate were placed at each foramen (level) into the transforaminal epidural space.   Additional Comments:  No complications occurred Dressing: 2 x 2 sterile gauze and Band-Aid    Post-procedure details: Patient was observed during the procedure. Post-procedure instructions were reviewed.  Patient left the clinic in stable condition.    Clinical History: MRI LUMBAR SPINE WITHOUT CONTRAST   TECHNIQUE: Multiplanar, multisequence MR imaging of the lumbar spine was performed. No intravenous contrast was administered.   COMPARISON:  Radiographs January 17, 2021   FINDINGS: Segmentation:  Standard.   Alignment: Severe levoconvex scoliosis of the lumbar spine centered on L2. Small retrolisthesis at T12-L1, L1-2 and L2-3 small anterolisthesis at L3-4.   Vertebrae:  No fracture, evidence of discitis, or bone lesion.   Conus medullaris and cauda equina: Conus extends to the level. Conus and cauda equina appear normal.   Paraspinal and other soft tissues: A T1 hyperintense, T2 hypointense lesion in the right kidney measuring 1.4 cm, may represent hemorrhagic cyst. Hiatal hernia.   Disc levels:   T11-12: Right central disc protrusion causing indentation on the thecal sac and facet degenerative changes. No significant spinal canal or neural foraminal stenosis.   T12-L1: Disc bulge and facet  degenerative changes. Moderate right neural foraminal narrowing. No significant spinal canal stenosis.   L1-2: Partial disc space fusion, right asymmetric disc bulge with associated osteophytic component and facet degenerative changes resulting in narrowing of the right subarticular zone and moderate right neural foraminal narrowing.   L2-3: Right asymmetric disc bulge and facet degenerative changes resulting in mild narrowing of the right subarticular zone and mild right neural foraminal narrowing.   L3-4: Disc bulge, prominent facet degenerative changes resulting in moderate spinal canal stenosis with narrowing of the right subarticular zone, moderate to severe right and mild left neural foraminal narrowing.   L4-5: Disc bulge, prominent facet degenerative changes, left greater than right resulting in mild spinal canal stenosis with narrowing of the left subarticular zone, mild right and moderate left neural foraminal narrowing.   L5-S1: Shallow disc bulge and moderate facet degenerative changes, left greater than right resulting in mild left neural foraminal narrowing. No spinal canal stenosis.   IMPRESSION: Degenerative changes of the lumbar spine superimposed on a severe levoconvex rotoscoliosis, more pronounced at L3-4 where there is moderate spinal canal stenosis and moderate to severe on the right neural foraminal narrowing.     Electronically Signed   By: Blenda Nicely.D.  On: 02/22/2021 10:00     Objective:  VS:  HT:    WT:   BMI:     BP:(!) 109/47  HR:64bpm  TEMP: ( )  RESP:  Physical Exam Vitals and nursing note reviewed.  Constitutional:      General: She is not in acute distress.    Appearance: Normal appearance. She is not ill-appearing.  HENT:     Head: Normocephalic and atraumatic.     Right Ear: External ear normal.     Left Ear: External ear normal.  Eyes:     Extraocular Movements: Extraocular movements intact.   Cardiovascular:     Rate and Rhythm: Normal rate.     Pulses: Normal pulses.  Pulmonary:     Effort: Pulmonary effort is normal. No respiratory distress.  Abdominal:     General: There is no distension.     Palpations: Abdomen is soft.  Musculoskeletal:        General: Tenderness present.     Cervical back: Neck supple.     Right lower leg: No edema.     Left lower leg: No edema.     Comments: Severe thoracolumbar scoliosis. patient has good distal strength with no pain over the greater trochanters.  No clonus or focal weakness.  Skin:    Findings: No erythema, lesion or rash.  Neurological:     General: No focal deficit present.     Mental Status: She is alert and oriented to person, place, and time.     Sensory: No sensory deficit.     Motor: Weakness present. No abnormal muscle tone.     Coordination: Coordination normal.     Gait: Gait abnormal.  Psychiatric:        Mood and Affect: Mood normal.        Behavior: Behavior normal.     Imaging: No results found.

## 2021-04-29 NOTE — Procedures (Signed)
Lumbosacral Transforaminal Epidural Steroid Injection - Sub-Pedicular Approach with Fluoroscopic Guidance  Patient: Stacey [name]      Date of Birth: 03-04-1933 MRN: [id number] PCP: [name], MD      Visit Date: 04/26/2021   Universal Protocol:    Date/Time: 04/26/2021  Consent Given By: the patient  Position: PRONE  Additional Comments: Vital signs were monitored before and after the procedure. Patient was prepped and draped in the usual sterile fashion. The correct patient, procedure, and site was verified.   Injection Procedure Details:   Procedure diagnoses: Lumbar radiculopathy [M54.16]    Meds Administered:  Meds ordered this encounter  Medications   methylPREDNISolone acetate (DEPO-MEDROL) injection 80 mg    Laterality: Right  Location/Site: L3  Needle:5.0 in., 22 ga.  Short bevel or Quincke spinal needle  Needle Placement: Transforaminal  Findings:    -Comments: Excellent flow of contrast along the nerve, nerve root and into the epidural space.  Initially had significant difficulty obtaining flow of contrast in the foramen and over the nerve root.  With subsequent repositioning and movement and the patient was tolerating very well we did ultimately get good flow contrast.  Procedure Details: After squaring off the end-plates to get a true AP view, the C-arm was positioned so that an oblique view of the foramen as noted above was visualized. The target area is just inferior to the "nose of the scotty dog" or sub pedicular. The soft tissues overlying this structure were infiltrated with 2-3 ml. of 1% Lidocaine without Epinephrine.  The spinal needle was inserted toward the target using a "trajectory" view along the fluoroscope beam.  Under AP and lateral visualization, the needle was advanced so it did not puncture dura and was located close the 6 O'Clock position of the pedical in AP tracterory. Biplanar projections were used to confirm position.  Aspiration was confirmed to be negative for CSF and/or blood. A 1-2 ml. volume of Isovue-250 was injected and flow of contrast was noted at each level. Radiographs were obtained for documentation purposes.   After attaining the desired flow of contrast documented above, a 0.5 to 1.0 ml test dose of 0.25% Marcaine was injected into each respective transforaminal space.  The patient was observed for 90 seconds post injection.  After no sensory deficits were reported, and normal lower extremity motor function was noted,   the above injectate was administered so that equal amounts of the injectate were placed at each foramen (level) into the transforaminal epidural space.   Additional Comments:  No complications occurred Dressing: 2 x 2 sterile gauze and Band-Aid    Post-procedure details: Patient was observed during the procedure. Post-procedure instructions were reviewed.  Patient left the clinic in stable condition.

## 2021-04-30 NOTE — Progress Notes (Signed)
  Subjective:  Patient ID: Stacey [name], female    DOB: [birth year],  MRN: [id number]  Chief Complaint  Patient presents with   Foot Ulcer    Right, 3 week follow up   87 y.o. female presents for wound care. Hx confirmed with patient.  Thinks the wound is doing about the same denies new concerns or issues. Objective:  Physical Exam: Wound Location: right 2nd toe Wound Measurement: 0.3x0.3 Wound Base: Granular Peri-wound: Normal Exudate: None: wound tissue dry wound without warmth, signs of acute infection.  Assessment:   1. Skin ulcer of second toe of right foot with fat layer exposed (HCC)     Plan:  Patient was evaluated and treated and all questions answered.  Ulcer right 2nd toe -Wound about the same.  This was gently debrided.  Her surgical shoe was swapped out for a different size as the shoe she was given last visit was little too large.  Dressed the ulceration with Iodosorb Band-Aid.  Follow-up in 1 month for recheck Return in about 4 weeks (around 05/22/2021) for Wound check.

## 2021-05-03 DIAGNOSIS — Z20822 Contact with and (suspected) exposure to covid-19: Secondary | ICD-10-CM | POA: Diagnosis not present

## 2021-05-10 ENCOUNTER — Encounter: Payer: Self-pay | Admitting: Adult Health

## 2021-05-10 ENCOUNTER — Telehealth (INDEPENDENT_AMBULATORY_CARE_PROVIDER_SITE_OTHER): Payer: Medicare Other | Admitting: Adult Health

## 2021-05-10 VITALS — Ht 63.0 in | Wt 97.0 lb

## 2021-05-10 DIAGNOSIS — M545 Low back pain, unspecified: Secondary | ICD-10-CM

## 2021-05-10 DIAGNOSIS — U071 COVID-19: Secondary | ICD-10-CM

## 2021-05-10 MED ORDER — NIRMATRELVIR/RITONAVIR (PAXLOVID)TABLET: 3.0000 | ORAL_TABLET | Freq: Two times a day (BID) | ORAL | 0 refills | Status: AC
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
## 2021-05-11 ENCOUNTER — Ambulatory Visit (INDEPENDENT_AMBULATORY_CARE_PROVIDER_SITE_OTHER): Payer: Medicare Other

## 2021-05-11 ENCOUNTER — Encounter (HOSPITAL_COMMUNITY): Payer: Self-pay

## 2021-05-11 ENCOUNTER — Ambulatory Visit (HOSPITAL_COMMUNITY)
Admission: RE | Admit: 2021-05-11 | Discharge: 2021-05-11 | Disposition: A | Discharge status: Home / Self Care | Payer: Medicare Other | Source: Ambulatory Visit | Attending: Nurse Practitioner | Admitting: Nurse Practitioner

## 2021-05-11 ENCOUNTER — Other Ambulatory Visit: Payer: Self-pay | Admitting: Family Medicine

## 2021-05-11 ENCOUNTER — Other Ambulatory Visit: Payer: Self-pay

## 2021-05-11 VITALS — BP 122/77 | HR 68 | Temp 98.2°F | Resp 20

## 2021-05-11 DIAGNOSIS — S39012A Strain of muscle, fascia and tendon of lower back, initial encounter: Secondary | ICD-10-CM

## 2021-05-11 DIAGNOSIS — I509 Heart failure, unspecified: Secondary | ICD-10-CM

## 2021-05-11 DIAGNOSIS — M4180 Other forms of scoliosis, site unspecified: Secondary | ICD-10-CM

## 2021-05-11 DIAGNOSIS — I482 Chronic atrial fibrillation, unspecified: Secondary | ICD-10-CM

## 2021-05-11 DIAGNOSIS — M5137 Other intervertebral disc degeneration, lumbosacral region: Secondary | ICD-10-CM | POA: Diagnosis not present

## 2021-05-11 DIAGNOSIS — M545 Low back pain, unspecified: Secondary | ICD-10-CM

## 2021-05-11 DIAGNOSIS — W19XXXA Unspecified fall, initial encounter: Secondary | ICD-10-CM | POA: Diagnosis not present

## 2021-05-11 MED ORDER — PREDNISONE 20 MG PO TABS: 40.0000 mg | ORAL_TABLET | Freq: Every day | ORAL | 0 refills | Status: AC

## 2021-05-11 NOTE — Progress Notes (Signed)
I have reviewed available documentation from this visit and I agree with recommendations given.  Stacey [name] G. Branden Shallenberger, MD  [facility]. [place] office.  

## 2021-05-11 NOTE — Discharge Instructions (Addendum)
The x-ray of your back did not show any acute abnormality from your injury. You have severe curvature of your spine as well as disc degeneration which is age-related wear-and-tear on the spine.   Stop your daily prednisone and start the short course of prednisone that I will prescribe.   You may resume your daily prednisone after that.   Continue your usual pain medication regimen.   Physical therapy consult has been ordered   Follow-up with orthopedics as well

## 2021-05-11 NOTE — Progress Notes (Addendum)
I have reviewed available documentation from this visit and I agree with recommendations given.  Lorella Gomez G. Rebekka Lobello, MD  [facility]. [place] office.  
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
## 2021-05-14 ENCOUNTER — Other Ambulatory Visit: Payer: Self-pay | Admitting: Interventional Cardiology

## 2021-05-21 ENCOUNTER — Ambulatory Visit (INDEPENDENT_AMBULATORY_CARE_PROVIDER_SITE_OTHER): Payer: Medicare Other

## 2021-05-21 DIAGNOSIS — Z7901 Long term (current) use of anticoagulants: Secondary | ICD-10-CM | POA: Diagnosis not present

## 2021-05-21 LAB — POCT INR: INR: 5.2 — AB (ref 2.0–3.0)

## 2021-05-21 NOTE — Patient Instructions (Addendum)
Pre visit review using our clinic review tool, if applicable. No additional management support is needed unless otherwise documented below in the visit note.  Hold dose today and hold dose tomorrow and then change weekly dose to take 1/2 tablet daily except take 1 tablet on Mon, Wed, and Fri. Recheck in 2 wks. 

## 2021-05-25 ENCOUNTER — Ambulatory Visit: Payer: Medicare Other | Admitting: Podiatry

## 2021-06-04 ENCOUNTER — Encounter: Payer: Self-pay | Admitting: Podiatry

## 2021-06-04 ENCOUNTER — Ambulatory Visit (INDEPENDENT_AMBULATORY_CARE_PROVIDER_SITE_OTHER): Payer: Medicare Other | Admitting: Podiatry

## 2021-06-04 ENCOUNTER — Other Ambulatory Visit: Payer: Self-pay

## 2021-06-04 DIAGNOSIS — L97512 Non-pressure chronic ulcer of other part of right foot with fat layer exposed: Secondary | ICD-10-CM

## 2021-06-04 DIAGNOSIS — I739 Peripheral vascular disease, unspecified: Secondary | ICD-10-CM

## 2021-06-04 MED ORDER — DOXYCYCLINE HYCLATE 100 MG PO TABS: 100.0000 mg | ORAL_TABLET | Freq: Two times a day (BID) | ORAL | 0 refills | Status: AC

## 2021-06-04 NOTE — Progress Notes (Signed)
  Subjective:  Patient ID: Stacey [name], female    DOB: 1933/05/11,   MRN: [id number]  Chief Complaint  Patient presents with   Foot Ulcer    Right foot 2nd toe. Patient denies nausea, vomiting, fever and chills.     87 y.o. female presents for follow-up of right second digit wound. States she has been putting mupirocin on the wound and dressing regularly.  Denies any other pedal complaints. Denies n/v/f/c.   Past Medical History:  Diagnosis Date   Atrial fibrillation (HCC)    Hernia of abdominal cavity    Kyphosis     Objective:  Physical Exam: Vascular: DP/PT pulses 2/4 bilateral. CFT <3 seconds. Normal hair growth on digits. No edema.  Skin. No lacerations or abrasions bilateral feet. 0.5 cm x 0.5 cm x 0.2 cm with fibrotic base. Tendon visible. Mild erythema surrounding.  Musculoskeletal: MMT 5/5 bilateral lower extremities in DF, PF, Inversion and Eversion. Deceased ROM in DF of ankle joint. Tender to wound area.  Neurological: Sensation intact to light touch.   Assessment:   1. Skin ulcer of second toe of right foot with fat layer exposed (HCC)   2. PAD (peripheral artery disease) (HCC)      Plan:  Patient was evaluated and treated and all questions answered. Ulcer right second digit with fat layer exposed.  -Dressed with bactiracin, DSD. -Off-loading with surgical shoe. -Prescription for doxycyline porivded.  -Discussed proper protein-rich diet.  -Discussed if any worsening redness, pain, fever or chills to call or may need to report to the emergency room. Patient expressed understanding.    Return in about 4 weeks (around 07/02/2021) for wound check.   Louann Sjogren, DPM

## 2021-06-05 ENCOUNTER — Other Ambulatory Visit: Payer: Self-pay | Admitting: Family Medicine

## 2021-06-05 DIAGNOSIS — R42 Dizziness and giddiness: Secondary | ICD-10-CM

## 2021-06-05 DIAGNOSIS — K219 Gastro-esophageal reflux disease without esophagitis: Secondary | ICD-10-CM

## 2021-06-08 MED ORDER — MECLIZINE HCL 12.5 MG PO TABS
12.5000 mg | ORAL_TABLET | Freq: Three times a day (TID) | ORAL | 0 refills | Status: AC | PRN
Start: 2021-06-08 — End: ?

## 2021-06-09 LAB — POCT INR: INR: 2.4 (ref 2.0–3.0)

## 2021-06-11 ENCOUNTER — Ambulatory Visit (INDEPENDENT_AMBULATORY_CARE_PROVIDER_SITE_OTHER): Payer: Medicare Other

## 2021-06-11 DIAGNOSIS — Z7901 Long term (current) use of anticoagulants: Secondary | ICD-10-CM | POA: Diagnosis not present

## 2021-06-11 NOTE — Progress Notes (Signed)
Continue 1/2 tablet daily except take 1 tablet on Mon, Wed, and Fri. Recheck in 2 wks.

## 2021-06-11 NOTE — Patient Instructions (Addendum)
Pre visit review using our clinic review tool, if applicable. No additional management support is needed unless otherwise documented below in the visit note.  Continue 1/2 tablet daily except take 1 tablet on Mon, Wed, and Fri. Recheck in 2 wks.

## 2021-06-14 DIAGNOSIS — Z20822 Contact with and (suspected) exposure to covid-19: Secondary | ICD-10-CM | POA: Diagnosis not present

## 2021-06-14 NOTE — Progress Notes (Signed)
Hold dose today and hold dose tomorrow and then change weekly dose to take 1/2 tablet daily except take 1 tablet on Mon, Wed, and Fri. Recheck in 2 wks.

## 2021-06-18 ENCOUNTER — Encounter: Payer: Self-pay | Admitting: Podiatry

## 2021-06-18 ENCOUNTER — Ambulatory Visit (INDEPENDENT_AMBULATORY_CARE_PROVIDER_SITE_OTHER): Payer: Medicare Other | Admitting: Podiatry

## 2021-06-18 ENCOUNTER — Other Ambulatory Visit: Payer: Self-pay | Admitting: Podiatry

## 2021-06-18 ENCOUNTER — Telehealth: Payer: Self-pay | Admitting: *Deleted

## 2021-06-18 ENCOUNTER — Other Ambulatory Visit: Payer: Self-pay

## 2021-06-18 DIAGNOSIS — M1991 Primary osteoarthritis, unspecified site: Secondary | ICD-10-CM | POA: Insufficient documentation

## 2021-06-18 DIAGNOSIS — I739 Peripheral vascular disease, unspecified: Secondary | ICD-10-CM | POA: Diagnosis not present

## 2021-06-18 DIAGNOSIS — M5430 Sciatica, unspecified side: Secondary | ICD-10-CM | POA: Insufficient documentation

## 2021-06-18 DIAGNOSIS — M79609 Pain in unspecified limb: Secondary | ICD-10-CM | POA: Insufficient documentation

## 2021-06-18 DIAGNOSIS — L97512 Non-pressure chronic ulcer of other part of right foot with fat layer exposed: Secondary | ICD-10-CM | POA: Diagnosis not present

## 2021-06-18 DIAGNOSIS — Z7952 Long term (current) use of systemic steroids: Secondary | ICD-10-CM | POA: Insufficient documentation

## 2021-06-18 DIAGNOSIS — M5410 Radiculopathy, site unspecified: Secondary | ICD-10-CM | POA: Insufficient documentation

## 2021-06-18 DIAGNOSIS — M25519 Pain in unspecified shoulder: Secondary | ICD-10-CM | POA: Insufficient documentation

## 2021-06-18 MED ORDER — DOXYCYCLINE HYCLATE 100 MG PO TABS
100.0000 mg | ORAL_TABLET | Freq: Two times a day (BID) | ORAL | 0 refills | Status: AC
Start: 2021-06-18 — End: 2021-06-25

## 2021-06-18 MED ORDER — DOXYCYCLINE HYCLATE 100 MG PO TABS: 100.0000 mg | ORAL_TABLET | Freq: Two times a day (BID) | ORAL | 0 refills | Status: DC

## 2021-06-18 NOTE — Progress Notes (Signed)
  Subjective:  Patient ID: Stacey [name], female    DOB: 1932-11-10,   MRN: [id number]  Chief Complaint  Patient presents with   Foot Ulcer    I am doing better on the 2nd toe right and looks smaller      87 y.o. female presents for follow-up of right second digit wound. States she has been putting mupirocin on the wound and dressing regularly. Denies pain. Feels it has improved but some increased redness. Denies any other pedal complaints. Denies n/v/f/c.   Past Medical History:  Diagnosis Date   Atrial fibrillation (HCC)    Hernia of abdominal cavity    Kyphosis     Objective:  Physical Exam: Vascular: DP/PT pulses 2/4 bilateral. CFT <3 seconds. Normal hair growth on digits. No edema.  Skin. No lacerations or abrasions bilateral feet. 0.5 cm x 0.5 cm x 0.2 cm with fibrotic base .Erythema surrounding. Some erythema noted to distal right hallux.  Musculoskeletal: MMT 5/5 bilateral lower extremities in DF, PF, Inversion and Eversion. Deceased ROM in DF of ankle joint. Tender to wound area.  Neurological: Sensation intact to light touch.   Assessment:   1. Skin ulcer of second toe of right foot with fat layer exposed (HCC)   2. PAD (peripheral artery disease) (HCC)       Plan:  Patient was evaluated and treated and all questions answered. Ulcer right second digit with fat layer exposed.  -Dressed with iodosorb, DSD. -Off-loading with surgical shoe. -Prescription for doxycyline porivded.  -Discussed proper protein-rich diet.  -Discussed if any worsening redness, pain, fever or chills to call or may need to report to the emergency room. Patient expressed understanding.    Return in about 2 weeks (around 07/02/2021) for wound check.   Louann Sjogren, DPM

## 2021-06-18 NOTE — Telephone Encounter (Signed)
Patient called and said that the prescription has been sent to wrong pharmacy, resend to [facility] [address],[facility].

## 2021-06-20 NOTE — Telephone Encounter (Signed)
Returned call to patient,no answer, left voice message that medication has been resent to correct pharmacy.

## 2021-06-25 ENCOUNTER — Other Ambulatory Visit: Payer: Self-pay | Admitting: Family Medicine

## 2021-06-27 ENCOUNTER — Telehealth: Payer: Self-pay

## 2021-06-27 NOTE — Telephone Encounter (Signed)
Pt was due for INR check on 11/14. LVM for pt to test and call in result

## 2021-07-02 ENCOUNTER — Other Ambulatory Visit: Payer: Self-pay

## 2021-07-02 ENCOUNTER — Ambulatory Visit (INDEPENDENT_AMBULATORY_CARE_PROVIDER_SITE_OTHER): Payer: Medicare Other

## 2021-07-02 ENCOUNTER — Ambulatory Visit (INDEPENDENT_AMBULATORY_CARE_PROVIDER_SITE_OTHER): Payer: Medicare Other | Admitting: Podiatry

## 2021-07-02 ENCOUNTER — Encounter: Payer: Self-pay | Admitting: Podiatry

## 2021-07-02 DIAGNOSIS — I739 Peripheral vascular disease, unspecified: Secondary | ICD-10-CM | POA: Diagnosis not present

## 2021-07-02 DIAGNOSIS — L97512 Non-pressure chronic ulcer of other part of right foot with fat layer exposed: Secondary | ICD-10-CM | POA: Diagnosis not present

## 2021-07-02 NOTE — Progress Notes (Signed)
  Subjective:  Patient ID: Stacey [name], female    DOB: [birth year],   MRN: [id number]  Chief Complaint  Patient presents with   Foot Ulcer    The 2nd toe on the right is better I think and the surgical shoe helps and I have been using the band aids     87 y.o. female presents for follow-up of right second digit wound. States she has been putting mupirocin on the wound and dressing regularly. Denies pain. Feels it has improved and has finished the antibiotics. Here today with granddaughter. Denies any other pedal complaints. Denies n/v/f/c.   Past Medical History:  Diagnosis Date   Atrial fibrillation (HCC)    Hernia of abdominal cavity    Kyphosis     Objective:  Physical Exam: Vascular: DP/PT pulses 2/4 bilateral. CFT <3 seconds. Normal hair growth on digits. No edema.  Skin. No lacerations or abrasions bilateral feet. 0.5 cm x 0.4 cm x 0.2 cm with fibrogranular .Mild erythema surrounding, improved.  Some erythema noted to distal right hallux.  Musculoskeletal: MMT 5/5 bilateral lower extremities in DF, PF, Inversion and Eversion. Deceased ROM in DF of ankle joint. Tender to wound area.  Neurological: Sensation intact to light touch.   Assessment:   1. PAD (peripheral artery disease) (HCC)   2. Skin ulcer of second toe of right foot with fat layer exposed (HCC)       Plan:  Patient was evaluated and treated and all questions answered. Ulcer right second digit with fat layer exposed.  -Dressed with iodosorb, DSD. Continue dressing with iodosorb at home.  -Off-loading with surgical shoe. -No additional antibiotics needed today.  -Referral for ABIs placed to check blood flow.  - X-rays reivewed severe osteopenia and degenerative changes of the joint. No active osseous erosions noted.  -Discussed proper protein-rich diet.  -Discussed if any worsening redness, pain, fever or chills to call or may need to report to the emergency room. Patient expressed understanding.   Patient to return in 2 weeks for wound check.    No follow-ups on file.   Louann Sjogren, DPM

## 2021-07-02 NOTE — Telephone Encounter (Signed)
Contacted Stacey [name], pt's daughter, and reported pt is due for INR reading. She reports pt will be at her house on 11/24 and she will test her then. Advised clinic is closed 11/24 and 11/25 due to holiday. She said she would try to make it to the pt's house tomorrow and send in the result. Advised if any changes to contact office. Stacey [name] verbalized understanding.

## 2021-07-03 DIAGNOSIS — Z7901 Long term (current) use of anticoagulants: Secondary | ICD-10-CM | POA: Diagnosis not present

## 2021-07-03 DIAGNOSIS — I4821 Permanent atrial fibrillation: Secondary | ICD-10-CM | POA: Diagnosis not present

## 2021-07-04 ENCOUNTER — Ambulatory Visit (INDEPENDENT_AMBULATORY_CARE_PROVIDER_SITE_OTHER): Payer: Medicare Other

## 2021-07-04 DIAGNOSIS — Z7901 Long term (current) use of anticoagulants: Secondary | ICD-10-CM | POA: Diagnosis not present

## 2021-07-04 LAB — POCT INR: INR: 2.9 (ref 2.0–3.0)

## 2021-07-04 NOTE — Patient Instructions (Addendum)
Pre visit review using our clinic review tool, if applicable. No additional management support is needed unless otherwise documented below in the visit note.  Continue 1/2 tablet daily except take 1 tablet on Mon, Wed, and Fri. Recheck in 2 wks.

## 2021-07-04 NOTE — Progress Notes (Signed)
Continue 1/2 tablet daily except take 1 tablet on Mon, Wed, and Fri. Recheck in 2 wks.

## 2021-07-11 ENCOUNTER — Ambulatory Visit (INDEPENDENT_AMBULATORY_CARE_PROVIDER_SITE_OTHER): Payer: Medicare Other | Admitting: Family Medicine

## 2021-07-11 VITALS — BP 118/68 | HR 60 | Temp 97.9°F

## 2021-07-11 DIAGNOSIS — R310 Gross hematuria: Secondary | ICD-10-CM | POA: Diagnosis not present

## 2021-07-11 DIAGNOSIS — N3001 Acute cystitis with hematuria: Secondary | ICD-10-CM

## 2021-07-11 DIAGNOSIS — R350 Frequency of micturition: Secondary | ICD-10-CM | POA: Diagnosis not present

## 2021-07-11 DIAGNOSIS — H1132 Conjunctival hemorrhage, left eye: Secondary | ICD-10-CM

## 2021-07-11 LAB — POCT URINALYSIS DIPSTICK
Bilirubin, UA: NEGATIVE
Blood, UA: POSITIVE
Glucose, UA: NEGATIVE
Ketones, UA: NEGATIVE
Nitrite, UA: NEGATIVE
Protein, UA: POSITIVE — AB
Spec Grav, UA: 1.025 (ref 1.010–1.025)
Urobilinogen, UA: 0.2 E.U./dL
pH, UA: 6 (ref 5.0–8.0)

## 2021-07-11 MED ORDER — AMOXICILLIN 500 MG PO TABS: 500.0000 mg | ORAL_TABLET | Freq: Two times a day (BID) | ORAL | 0 refills | Status: AC

## 2021-07-11 NOTE — Progress Notes (Signed)
Subjective:    Patient ID: Stacey [name], female    DOB: 29-Mar-1933, 87 y.o.   MRN: [id number]  Chief Complaint  Patient presents with   Urinary Tract Infection    Pressure, had a fever on ad off, going on for 2 week, blood in urine -grayish-pink. Frequency feeling tired. Took some AZO about a week ago, possibly not consistant. Drinking plenty of fluids   Eye Problem    Left eye.  Patient is accompanied by her granddaughter.  HPI Patient was seen today for ongoing concerns.  Patient endorses increased urinary pressure, hematuria, frequency and fatigue.  Patient states symptoms have been going on for the last 2 weeks or so.  Patient tried OTC Azo.  Trying to increase p.o. intake of water.  Patient also notes erythema of left eye.  Occurred a few weeks ago.  May have coughed at the time.  Denies injury, eye pain, sensitivity to light.  Taking Coumadin for history of A. fib.  Had recent INR.  Also notes increased ecchymosis on hands and legs.  Patient also notes history of lymphedema.  Endorses increased swelling a few weeks ago, beginning to improve.  Patient states she is adjusting to living at [facility].  Patient was previously living at home with her daughter and son-in-law.  Past Medical History:  Diagnosis Date   Atrial fibrillation (HCC)    Hernia of abdominal cavity    Kyphosis     Allergies  Allergen Reactions   Sulfa Antibiotics Anaphylaxis   Codeine Nausea And Vomiting   Latex Rash   Neosporin Original [Bacitracin-Neomycin-Polymyxin] Rash   Polysporin [Bacitracin-Polymyxin B] Rash    ROS General: Denies fever, chills, night sweats, changes in weight, changes in appetite + fatigue HEENT: Denies headaches, ear pain, changes in vision, rhinorrhea, sore throat + erythema of left eye CV: Denies CP, palpitations, SOB, orthopnea + bilateral lower extremity lymphedema Pulm: Denies SOB, cough, wheezing GI: Denies abdominal pain, nausea, vomiting, diarrhea,  constipation GU: Denies dysuria, hematuria, frequency, vaginal discharge  + hematuria, frequency, dysuria Msk: Denies muscle cramps, joint pains Neuro: Denies weakness, numbness, tingling Skin: Denies rashes, bruising + ecchymosis Psych: Denies depression, anxiety, hallucinations      Objective:    Blood pressure 118/68, pulse 60, temperature 97.9 F (36.6 C), temperature source Oral, SpO2 94 %.  Gen. Pleasant, well-nourished, in no distress, normal affect   HEENT: [place]/AT, face symmetric, conjunctiva clear, left sclera erythematous with conjunctival hemorrhage, PERRLA, EOMI, nares patent without drainage, pharynx without erythema or exudate. Lungs: no accessory muscle use, CTAB, no wheezes or rales Cardiovascular: Irregular, no m/r/g, no peripheral edema Abdomen: BS present, soft, NT/ND Musculoskeletal: Kyphosis, and deformity of bilateral hands, no deformities, no cyanosis or clubbing, normal tone Neuro:  A&Ox3, CN II-XII intact, gait not assessed as sitting in transport wheelchair Skin:  Warm, no lesions/ rash   Wt Readings from Last 3 Encounters:  05/10/21 97 lb (44 kg)  04/24/21 97 lb 9.6 oz (44.3 kg)  12/07/20 103 lb 6.4 oz (46.9 kg)    Lab Results  Component Value Date   WBC 6.5 04/24/2021   HGB 12.2 04/24/2021   HCT 35.9 04/24/2021   PLT 226 04/24/2021   GLUCOSE 95 04/24/2021   ALT 16 12/07/2020   AST 28 12/07/2020   NA 137 04/24/2021   K 4.4 04/24/2021   CL 97 04/24/2021   CREATININE 0.81 04/24/2021   BUN 18 04/24/2021   CO2 25 04/24/2021   INR 2.9 07/04/2021  Assessment/Plan:  Gross hematuria - Plan: POCT urinalysis dipstick, Urine Culture  Urinary frequency - Plan: POCT urinalysis dipstick, Urine Culture  Acute cystitis with hematuria -POC UA with protein, 3+ leuks, positive for blood -We will start amoxicillin -Advised may need to adjust antibiotic based on culture results -Increase p.o. intake of water and fluids -Follow-up for continued or  worsening symptoms - Plan: amoxicillin (AMOXIL) 500 MG tablet, POCT urinalysis dipstick, Urine Culture  Conjunctival hemorrhage of left eye -Asymptomatic -Likely 2/2 chronic anticoagulation with Coumadin 2/2 history of chronic A. fib -Has repeat INR in the next few weeks -Given strict precautions.  F/u as needed for continued or worsening symptoms  Grier Mitts, MD

## 2021-07-13 LAB — URINE CULTURE
MICRO NUMBER:: 12696260
SPECIMEN QUALITY:: ADEQUATE

## 2021-07-15 LAB — POCT INR: INR: 2.6 (ref 2.0–3.0)

## 2021-07-16 ENCOUNTER — Emergency Department (HOSPITAL_COMMUNITY)
Admission: EM | Admit: 2021-07-16 | Discharge: 2021-07-16 | Disposition: A | Discharge status: Home / Self Care | Payer: Medicare Other | Attending: Emergency Medicine | Admitting: Emergency Medicine

## 2021-07-16 ENCOUNTER — Encounter: Payer: Self-pay | Admitting: Podiatry

## 2021-07-16 ENCOUNTER — Other Ambulatory Visit: Payer: Self-pay

## 2021-07-16 ENCOUNTER — Emergency Department (HOSPITAL_COMMUNITY): Payer: Medicare Other

## 2021-07-16 ENCOUNTER — Ambulatory Visit (INDEPENDENT_AMBULATORY_CARE_PROVIDER_SITE_OTHER): Payer: Medicare Other | Admitting: Podiatry

## 2021-07-16 ENCOUNTER — Encounter (HOSPITAL_COMMUNITY): Payer: Self-pay

## 2021-07-16 ENCOUNTER — Encounter: Payer: Self-pay | Admitting: Family Medicine

## 2021-07-16 DIAGNOSIS — Z79899 Other long term (current) drug therapy: Secondary | ICD-10-CM | POA: Diagnosis not present

## 2021-07-16 DIAGNOSIS — L97511 Non-pressure chronic ulcer of other part of right foot limited to breakdown of skin: Secondary | ICD-10-CM

## 2021-07-16 DIAGNOSIS — Z87891 Personal history of nicotine dependence: Secondary | ICD-10-CM | POA: Insufficient documentation

## 2021-07-16 DIAGNOSIS — I4891 Unspecified atrial fibrillation: Secondary | ICD-10-CM | POA: Insufficient documentation

## 2021-07-16 DIAGNOSIS — Z7901 Long term (current) use of anticoagulants: Secondary | ICD-10-CM | POA: Diagnosis not present

## 2021-07-16 DIAGNOSIS — R600 Localized edema: Secondary | ICD-10-CM | POA: Diagnosis not present

## 2021-07-16 DIAGNOSIS — M05772 Rheumatoid arthritis with rheumatoid factor of left ankle and foot without organ or systems involvement: Secondary | ICD-10-CM | POA: Diagnosis not present

## 2021-07-16 DIAGNOSIS — L97519 Non-pressure chronic ulcer of other part of right foot with unspecified severity: Secondary | ICD-10-CM | POA: Insufficient documentation

## 2021-07-16 DIAGNOSIS — M19071 Primary osteoarthritis, right ankle and foot: Secondary | ICD-10-CM | POA: Diagnosis not present

## 2021-07-16 DIAGNOSIS — I739 Peripheral vascular disease, unspecified: Secondary | ICD-10-CM

## 2021-07-16 DIAGNOSIS — M05771 Rheumatoid arthritis with rheumatoid factor of right ankle and foot without organ or systems involvement: Secondary | ICD-10-CM

## 2021-07-16 DIAGNOSIS — L97514 Non-pressure chronic ulcer of other part of right foot with necrosis of bone: Secondary | ICD-10-CM

## 2021-07-16 DIAGNOSIS — Z9889 Other specified postprocedural states: Secondary | ICD-10-CM | POA: Diagnosis not present

## 2021-07-16 DIAGNOSIS — E11621 Type 2 diabetes mellitus with foot ulcer: Secondary | ICD-10-CM | POA: Diagnosis not present

## 2021-07-16 DIAGNOSIS — M7989 Other specified soft tissue disorders: Secondary | ICD-10-CM | POA: Diagnosis not present

## 2021-07-16 LAB — CBC WITH DIFFERENTIAL/PLATELET
Abs Immature Granulocytes: 0.04 10*3/uL (ref 0.00–0.07)
Basophils Absolute: 0.1 10*3/uL (ref 0.0–0.1)
Basophils Relative: 1 %
Eosinophils Absolute: 0 10*3/uL (ref 0.0–0.5)
Eosinophils Relative: 0 %
HCT: 34.8 % — ABNORMAL LOW (ref 36.0–46.0)
Hemoglobin: 11.1 g/dL — ABNORMAL LOW (ref 12.0–15.0)
Immature Granulocytes: 0 %
Lymphocytes Relative: 4 %
Lymphs Abs: 0.4 10*3/uL — ABNORMAL LOW (ref 0.7–4.0)
MCH: 30.2 pg (ref 26.0–34.0)
MCHC: 31.9 g/dL (ref 30.0–36.0)
MCV: 94.8 fL (ref 80.0–100.0)
Monocytes Absolute: 0.3 10*3/uL (ref 0.1–1.0)
Monocytes Relative: 3 %
Neutro Abs: 8.7 10*3/uL — ABNORMAL HIGH (ref 1.7–7.7)
Neutrophils Relative %: 92 %
Platelets: 273 10*3/uL (ref 150–400)
RBC: 3.67 MIL/uL — ABNORMAL LOW (ref 3.87–5.11)
RDW: 14.2 % (ref 11.5–15.5)
WBC: 9.5 10*3/uL (ref 4.0–10.5)
nRBC: 0 % (ref 0.0–0.2)

## 2021-07-16 LAB — BASIC METABOLIC PANEL
Anion gap: 11 (ref 5–15)
BUN: 15 mg/dL (ref 8–23)
CO2: 29 mmol/L (ref 22–32)
Calcium: 9 mg/dL (ref 8.9–10.3)
Chloride: 95 mmol/L — ABNORMAL LOW (ref 98–111)
Creatinine, Ser: 0.82 mg/dL (ref 0.44–1.00)
GFR, Estimated: >60 mL/min (ref >60)
Glucose, Bld: 97 mg/dL (ref 70–99)
Potassium: 3.9 mmol/L (ref 3.5–5.1)
Sodium: 135 mmol/L (ref 135–145)

## 2021-07-16 LAB — PROTIME-INR
INR: 2.4 — ABNORMAL HIGH (ref 0.8–1.2)
Prothrombin Time: 25.9 seconds — ABNORMAL HIGH (ref 11.4–15.2)

## 2021-07-16 LAB — SEDIMENTATION RATE: Sed Rate: 65 mm/hr — ABNORMAL HIGH (ref 0–22)

## 2021-07-16 LAB — LACTIC ACID, PLASMA
Lactic Acid, Venous: 0.9 mmol/L (ref 0.5–1.9)
Lactic Acid, Venous: 1.2 mmol/L (ref 0.5–1.9)

## 2021-07-16 LAB — C-REACTIVE PROTEIN: CRP: 1.2 mg/dL — ABNORMAL HIGH (ref <1.0)

## 2021-07-16 MED ORDER — ONDANSETRON 4 MG PO TBDP: ORAL_TABLET | ORAL | 0 refills | Status: DC

## 2021-07-16 MED ORDER — GADOBUTROL 1 MMOL/ML IV SOLN
5.0000 mL | Freq: Once | INTRAVENOUS | Status: AC | PRN
  Administered 2021-07-16: 5 mL via INTRAVENOUS

## 2021-07-16 MED ORDER — CEPHALEXIN 500 MG PO CAPS: 500.0000 mg | ORAL_CAPSULE | Freq: Two times a day (BID) | ORAL | 0 refills | Status: DC

## 2021-07-16 MED ORDER — PIPERACILLIN-TAZOBACTAM 3.375 G IVPB 30 MIN
3.3750 g | Freq: Once | INTRAVENOUS | Status: AC
  Administered 2021-07-16: 3.375 g via INTRAVENOUS
  Filled 2021-07-16: qty 50

## 2021-07-16 MED ORDER — VANCOMYCIN HCL IN DEXTROSE 1-5 GM/200ML-% IV SOLN
1000.0000 mg | Freq: Once | INTRAVENOUS | Status: AC
  Administered 2021-07-16: 1000 mg via INTRAVENOUS
  Filled 2021-07-16: qty 200

## 2021-07-16 NOTE — ED Notes (Signed)
MRI contacted per Silverio Lay MD Should be an hour for MRI for scan

## 2021-07-16 NOTE — ED Triage Notes (Signed)
Patient reports that she went to her podiatrist today because she has a right 2nd toe wound x 3 months. Patient states she has been on oral antibiotics and her physician thought the wound was worsening. Patient states she was sent for further evaluation and possible MRI and Iv antibiotics.

## 2021-07-16 NOTE — ED Provider Notes (Signed)
Emergency Medicine Provider Triage Evaluation Note  Stacey [name] , a 87 y.o. female  was evaluated in triage.  Pt complains of sent by podiatrist for ulcer on right second toe.  This has been present for many months.  The podiatrist was concerned it wasn't healing and sent her here.   She is being treated for a uti currently.   Per note by Louann Sjogren DPM "Patient was evaluated and treated and all questions answered. Ulcer right second digit with fat layer exposed.  -Dressed with iodosorb, DSD.  -Off-loading with surgical shoe. -Continue with antibiotics.  -Recommend at this time due to wound worsening with redness and purulence that patient report to the emergency room for IV antibiotics, MRI and vascular work-up to evaluated for infection and potential healing.  -Patient expressed understanding and will be heading over to the ED. "  Review of Systems  Positive: ulcer Negative: fever  Physical Exam  There were no vitals taken for this visit. Gen:   Awake, no distress   Resp:  Normal effort  MSK:   Moves extremities without difficulty  Other:  2DP pulse in right foot.  Sub cm ulcer on right second toe.  The toe is with out subcutaneous creptitis, or abnormal erythema.   Medical Decision Making  Medically screening exam initiated at 12:18 PM.  Appropriate orders placed.  Stacey [name] was informed that the remainder of the evaluation will be completed by another provider, this initial triage assessment does not replace that evaluation, and the importance of remaining in the ED until their evaluation is complete.  Note: Portions of this report may have been transcribed using voice recognition software. Every effort was made to ensure accuracy; however, inadvertent computerized transcription errors may be present     Norman Clay 07/16/21 1230    Linwood Dibbles, MD 07/18/21 1013

## 2021-07-16 NOTE — Progress Notes (Signed)
  Subjective:  Patient ID: Stacey [name], female    DOB: 02/08/1933,   MRN: [id number]  Chief Complaint  Patient presents with   Wound Check    Right foot 2nd toe      87 y.o. female presents for follow-up of right second digit wound. States she has been putting iodosorb on the wound and dressing regularly. Relates pain and worsening of the toe. Here today with her son-in-law Denies any other pedal complaints. Denies n/v/f/c.   Past Medical History:  Diagnosis Date   Atrial fibrillation (HCC)    Hernia of abdominal cavity    Kyphosis     Objective:  Physical Exam: Vascular: DP/PT pulses 2/4 bilateral. CFT <3 seconds. Normal hair growth on digits. No edema.  Skin. No lacerations or abrasions bilateral feet. 0.5 cm x 0.4 cm x 0.2 cm with fibrogranular .Mild erythema surrounding, improved.  Some erythema noted to distal right hallux.  Musculoskeletal: MMT 5/5 bilateral lower extremities in DF, PF, Inversion and Eversion. Deceased ROM in DF of ankle joint. Tender to wound area.  Neurological: Sensation intact to light touch.   Assessment:   1. 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 PAD (peripheral artery disease) (HCC)   3. Rheumatoid arthritis involving both feet with positive rheumatoid factor (HCC)        Plan:  Patient was evaluated and treated and all questions answered. Ulcer right second digit with fat layer exposed.  -Dressed with iodosorb, DSD.  -Off-loading with surgical shoe. -Continue with antibiotics.  -Recommend at this time due to wound worsening with redness and purulence that patient report to the emergency room for IV antibiotics, MRI and vascular work-up to evaluated for infection and potential healing.  -Patient expressed understanding and will be heading over to the ED.     No follow-ups on file.   Louann Sjogren, DPM

## 2021-07-16 NOTE — ED Provider Notes (Signed)
[facility] DEPT Provider Note   CSN: [id number] Arrival date & time: 07/16/21  1149     History No chief complaint on file.   Stacey [name] is a 87 y.o. female hx of afib on coumadin, here presenting with right second toe ulcer.  Patient has been having an ulcer in the right second toe for several months.  Patient initially had a course of doxycycline several weeks ago which initially helped but then the ulcer came back.  Patient then had a UTI and has been on amoxicillin for the several days.  Patient went to podiatry today and was sent here for possible osteomyelitis.  Provider also recommended vascular work-up with an ABI.  Patient denies any fevers or chills.  Patient admits to some purulent drainage from the right second toe  The history is provided by the patient.      Past Medical History:  Diagnosis Date   Atrial fibrillation ([place])    Hernia of abdominal cavity    Kyphosis     Patient Active Problem List   Diagnosis Date Noted   Long term systemic steroid user 06/18/2021   Pain in limb 06/18/2021   Primary osteoarthritis 06/18/2021   Radicular pain 06/18/2021   Sciatica 06/18/2021   Shoulder joint pain 06/18/2021   Presbycusis of both ears 11/21/2020   Rheumatoid arthritis involving multiple sites ([place]) 03/28/2020   History of total hip replacement, left 03/28/2020   PVD (peripheral vascular disease) ([place]) 11/20/2019   Osteopenia 11/20/2019   Stenosis of both subclavian arteries ([place]) 05/17/2019   Conductive hearing loss, bilateral 07/03/2018   Bilateral impacted cerumen 07/03/2018   Acute swimmer's ear of left side 07/03/2018   Long term (current) use of anticoagulants [Z79.01] 07/25/2017   Chronic atrial fibrillation ([place]) 07/11/2017   Primary osteoarthritis involving multiple joints 07/11/2017   Pseudogout 07/11/2017   Neuropathy 07/11/2017   Lymphedema 07/11/2017   Kyphosis of cervical region 07/11/2017   Congestive  heart failure ([place]) 07/11/2017    Past Surgical History:  Procedure Laterality Date   ABDOMINAL HYSTERECTOMY     CATARACT EXTRACTION, BILATERAL     TONSILLECTOMY       OB History   No obstetric history on file.     Family History  Problem Relation Age of Onset   Healthy Mother    Heart failure Father     Social History   Tobacco Use   Smoking status: Former   Smokeless tobacco: Never  Scientific laboratory technician Use: Never used  Substance Use Topics   Alcohol use: No   Drug use: No    Home Medications Prior to Admission medications   Medication Sig Start Date End Date Taking? Authorizing Provider  amoxicillin (AMOXIL) 500 MG tablet Take 1 tablet (500 mg total) by mouth 2 (two) times daily for 7 days. 07/11/21 07/18/21 Yes Billie Ruddy, MD  fexofenadine (ALLEGRA) 180 MG tablet TAKE ONE-HALF (1/2) TABLET DAILY FOR ALLERGIES Patient taking differently: Take 90 mg by mouth daily. 06/25/21  Yes Billie Ruddy, MD  atorvastatin (LIPITOR) 10 MG tablet TAKE 1 TABLET DAILY 03/07/21   Billie Ruddy, MD  atorvastatin (LIPITOR) 10 MG tablet 1 tablet    [provider]  BESIVANCE 0.6 % SUSP Apply to eye. Patient not taking: Reported on 07/11/2021 08/15/20   [provider]  cyanocobalamin 1000 MCG tablet Take 1,000 mcg by mouth daily.    [provider]  desloratadine (CLARINEX) 5 MG tablet  1 tablet Patient not taking: Reported on 07/11/2021    [provider]  diclofenac Sodium (VOLTAREN) 1 % GEL as needed.    [provider]  diclofenac Sodium (VOLTAREN) 1 % GEL See admin instructions.    [provider]  DUREZOL 0.05 % EMUL Apply to eye. Patient not taking: Reported on 07/11/2021 08/15/20   [provider]  gabapentin (NEURONTIN) 400 MG capsule TAKE 1 CAPSULE DAILY Patient taking differently: Take 400 mg by mouth. 02/14/21   Billie Ruddy, MD  meclizine (ANTIVERT) 12.5 MG tablet Take 1 tablet (12.5 mg total) by mouth  3 (three) times daily as needed for dizziness. 06/08/21   Billie Ruddy, MD  metoprolol tartrate (LOPRESSOR) 50 MG tablet TAKE 1 TABLET TWICE A DAY 05/11/21   Billie Ruddy, MD  Multiple Vitamin (MULTIVITAMIN) capsule Take 1 capsule by mouth daily.    [provider]  mupirocin cream (BACTROBAN) 2 % Apply fingertip amount to wound area daily. 04/24/21   Evelina Bucy, DPM  mupirocin ointment (BACTROBAN) 2 % Apply to wound once daily with light dressing. 07/27/19   Marzetta Board, DPM  omeprazole (PRILOSEC) 20 MG capsule TAKE 1 CAPSULE TWICE A DAY BEFORE MEALS 06/06/21   Billie Ruddy, MD  omeprazole (PRILOSEC) 20 MG capsule 1 capsule 30 minutes before morning meal    [provider]  Potassium Chloride ER 20 MEQ TBCR 1 tablet    [provider]  potassium chloride SA (KLOR-CON) 20 MEQ tablet Take 1 tablet (20 mEq total) by mouth daily. 05/15/21   Belva Crome, MD  predniSONE (DELTASONE) 5 MG tablet Take 1 tablet (5 mg total) by mouth daily with breakfast. 01/11/21   Billie Ruddy, MD  PROLENSA 0.07 % SOLN Apply to eye. 08/15/20   [provider]  sertraline (ZOLOFT) 100 MG tablet TAKE 1 TABLET DAILY 03/13/21   Billie Ruddy, MD  spironolactone (ALDACTONE) 25 MG tablet TAKE ONE-HALF (1/2) TABLET DAILY 03/23/21   Billie Ruddy, MD  torsemide (DEMADEX) 20 MG tablet TAKE 2 TABLETS DAILY 11/29/20   Billie Ruddy, MD  traMADol (ULTRAM) 50 MG tablet TAKE 2 TABLETS TWICE A DAY AS NEEDED 02/23/21   Billie Ruddy, MD  traMADol (ULTRAM) 50 MG tablet 1-2 tabs    [provider]  Vitamin D, Cholecalciferol, 25 MCG (1000 UT) TABS Take 1,000 Units by mouth daily.    [provider]  Vitamin D, Cholecalciferol, 25 MCG (1000 UT) TABS 1 tablet    [provider]  warfarin (COUMADIN) 5 MG tablet TAKE ONE-HALF (1/2) TABLET (2.5 MG) ON WEDNESDAY AND SATURDAY. 1 TABLET (5 MG) ALL OTHER DAYS 10/23/20   Billie Ruddy, MD     Allergies    Sulfa antibiotics, Tape, Codeine, Shellfish-derived products, Latex, Neosporin original [bacitracin-neomycin-polymyxin], and Polysporin [bacitracin-polymyxin b]  Review of Systems   Review of Systems  Musculoskeletal:        Right second toe swelling and pain  All other systems reviewed and are negative.  Physical Exam Updated Vital Signs BP (!) 130/94   Pulse 62   Temp 98.5 F (36.9 C) (Oral)   Resp 18   Ht 5' 3"  (1.6 m)   Wt 40.8 kg   SpO2 98%   BMI 15.94 kg/m   Physical Exam Vitals and nursing note reviewed.  Constitutional:      Appearance: Normal appearance.     Comments: Chronically ill   HENT:  Head: Normocephalic.     Mouth/Throat:     Mouth: Mucous membranes are moist.  Eyes:     Extraocular Movements: Extraocular movements intact.     Pupils: Pupils are equal, round, and reactive to light.  Cardiovascular:     Rate and Rhythm: Normal rate and regular rhythm.     Pulses: Normal pulses.     Heart sounds: Normal heart sounds.  Pulmonary:     Effort: Pulmonary effort is normal.     Breath sounds: Normal breath sounds.  Abdominal:     General: Abdomen is flat.     Palpations: Abdomen is soft.  Musculoskeletal:        General: Normal range of motion.     Cervical back: Normal range of motion and neck supple.     Comments: There is an ulcer on this right second toe.  There is minimal purulent discharge.  Patient does have some redness of the right foot.  Patient does have good DP pulse.  Neurological:     General: No focal deficit present.     Mental Status: Stacey [name] is alert.  Psychiatric:        Mood and Affect: Mood normal.        Behavior: Behavior normal.     ED Results / Procedures / Treatments   Labs (all labs ordered are listed, but only abnormal results are displayed) Labs Reviewed  BASIC METABOLIC PANEL - Abnormal; Notable for the following components:      Result Value   Chloride 95 (*)    All other components within  normal limits  CBC WITH DIFFERENTIAL/PLATELET - Abnormal; Notable for the following components:   RBC 3.67 (*)    Hemoglobin 11.1 (*)    HCT 34.8 (*)    Neutro Abs 8.7 (*)    Lymphs Abs 0.4 (*)    All other components within normal limits  SEDIMENTATION RATE - Abnormal; Notable for the following components:   Sed Rate 65 (*)    All other components within normal limits  C-REACTIVE PROTEIN - Abnormal; Notable for the following components:   CRP 1.2 (*)    All other components within normal limits  PROTIME-INR - Abnormal; Notable for the following components:   Prothrombin Time 25.9 (*)    INR 2.4 (*)    All other components within normal limits  LACTIC ACID, PLASMA  LACTIC ACID, PLASMA    EKG None  Radiology DG Toe 2nd Right  Result Date: 07/16/2021 CLINICAL DATA:  Nonhealing ulcer second right toe. EXAM: RIGHT SECOND TOE COMPARISON:  None. FINDINGS: Marked degenerative changes at the first metatarsophalangeal joint with bony overgrowth, sclerosis and marked loss of joint space. Subluxation of the second metatarsophalangeal joint. Additional degenerative changes in the interphalangeal joint of the first digit and proximal interphalangeal joint of the second digit. Soft tissue defects are seen along the medial aspect of the first distal phalanx as well as overlying the second distal phalanx. No definite underlying osseous erosion to suggest osteomyelitis. IMPRESSION: 1. No definite evidence of osteomyelitis. 2. Markedly advanced first MTP joint osteoarthritis. 3. Subluxed second metatarsophalangeal joint. 4. Additional osteoarthritis involving the second MTP joint, IP joint of the first digit and second PIP joint. Electronically Signed   By: Lorin Picket M.D.   On: 07/16/2021 13:00    Procedures Procedures   Medications Ordered in ED Medications  vancomycin (VANCOCIN) IVPB 1000 mg/200 mL premix (0 mg Intravenous Stopped 07/16/21 2110)  piperacillin-tazobactam (ZOSYN) IVPB 3.375 g  (  0 g Intravenous Stopped 07/16/21 2006)  gadobutrol (GADAVIST) 1 MMOL/ML injection 5 mL (5 mLs Intravenous Contrast Given 07/16/21 2157)    ED Course  I have reviewed the triage vital signs and the nursing notes.  Pertinent labs & imaging results that were available during my care of the patient were reviewed by me and considered in my medical decision making (see chart for details).    MDM Rules/Calculators/A&P                           Blakeleigh Domek is a 87 y.o. female here with R second toe ulcer.  Patient has nonhealing ulcer.  Patient sent in by podiatry to rule out osteomyelitis.  Patient is well-appearing overall.  Patient is afebrile.  We will get CBC and CMP and lactate and cultures.  Inflammatory markers were drawn in triage but patient has a history of rheumatoid arthritis and I have low suspicion for septic joint.  We will get MRI of the foot.  Unfortunately I cannot get ABI tonight but patient does have a good DP pulse.  Will order ABI to be done in the morning.  10:33 PM Patient's white blood cell count is normal.  Patient's ESR is 65 and CRP is 1.2.  However Stacey [name] has a history of rheumatoid arthritis.  I talked to Dr. Francoise Ceo from radiology regarding patient's MRI of the foot.  There is no obvious osteomyelitis.  However patient has significant arthritis at baseline.  Stacey [name] received Vanco and Zosyn in the ED.  I was able to message her podiatrist, Dr. Blenda Mounts.  I will change patient from Augmentin to Keflex.  I also ordered ABI to be done in the morning at the vascular lab and podiatry can follow-up with the results  Final Clinical Impression(s) / ED Diagnoses Final diagnoses:  None    Rx / DC Orders ED Discharge Orders          Ordered    VAS [place] ABI WITH/WO TBI        07/16/21 2005             Drenda Freeze, MD 07/16/21 2234

## 2021-07-16 NOTE — Discharge Instructions (Signed)
Stop taking amoxicillin.  Take Keflex instead.  If you get nauseated or vomiting, please take Zofran with it.   Your podiatrist request to get a dedicated ultrasound.  I ordered it to be done tomorrow.  They will call you and direct you to the vascular lab.  See your podiatrist for follow up  Return to ER if you have worse foot pain or swelling or fevers or purulent drainage.

## 2021-07-17 ENCOUNTER — Ambulatory Visit (INDEPENDENT_AMBULATORY_CARE_PROVIDER_SITE_OTHER): Payer: Medicare Other

## 2021-07-17 DIAGNOSIS — Z7952 Long term (current) use of systemic steroids: Secondary | ICD-10-CM | POA: Diagnosis not present

## 2021-07-17 NOTE — Patient Instructions (Signed)
Continue 1/2 tablet daily except take 1 tablet on Mon, Wed, and Fri. Recheck in 3 wks.

## 2021-07-17 NOTE — Progress Notes (Signed)
Continue 1/2 tablet daily except take 1 tablet on Mon, Wed, and Fri. Recheck in 3 wks.

## 2021-07-20 ENCOUNTER — Ambulatory Visit (HOSPITAL_COMMUNITY)
Admission: RE | Admit: 2021-07-20 | Discharge: 2021-07-20 | Disposition: A | Discharge status: Home / Self Care | Payer: Medicare Other | Source: Ambulatory Visit | Attending: Emergency Medicine | Admitting: Emergency Medicine

## 2021-07-20 ENCOUNTER — Other Ambulatory Visit: Payer: Self-pay

## 2021-07-20 DIAGNOSIS — L97519 Non-pressure chronic ulcer of other part of right foot with unspecified severity: Secondary | ICD-10-CM | POA: Insufficient documentation

## 2021-07-20 DIAGNOSIS — I10 Essential (primary) hypertension: Secondary | ICD-10-CM | POA: Insufficient documentation

## 2021-07-20 DIAGNOSIS — L0390 Cellulitis, unspecified: Secondary | ICD-10-CM

## 2021-07-20 DIAGNOSIS — E785 Hyperlipidemia, unspecified: Secondary | ICD-10-CM | POA: Insufficient documentation

## 2021-07-20 NOTE — Progress Notes (Signed)
ABI completed. Refer to "CV Proc" under chart review to view preliminary results.  07/20/2021 3:01 PM Eula Fried., MHA, RVT, RDCS, RDMS

## 2021-08-07 ENCOUNTER — Ambulatory Visit (INDEPENDENT_AMBULATORY_CARE_PROVIDER_SITE_OTHER): Payer: Medicare Other

## 2021-08-07 DIAGNOSIS — Z7952 Long term (current) use of systemic steroids: Secondary | ICD-10-CM

## 2021-08-07 LAB — POCT INR: INR: 2 (ref 2.0–3.0)

## 2021-08-07 NOTE — Progress Notes (Signed)
Continue 1/2 tablet daily except take 1 tablet on Mon, Wed, and Fri. Recheck in 4 wks.

## 2021-08-07 NOTE — Patient Instructions (Addendum)
Pre visit review using our clinic review tool, if applicable. No additional management support is needed unless otherwise documented below in the visit note.  Continue 1/2 tablet daily except take 1 tablet on Mon, Wed, and Fri. Recheck in 4 wks.

## 2021-08-15 ENCOUNTER — Other Ambulatory Visit: Payer: Self-pay | Admitting: Family Medicine

## 2021-08-21 ENCOUNTER — Ambulatory Visit: Payer: Medicare Other | Admitting: Podiatry

## 2021-08-22 ENCOUNTER — Other Ambulatory Visit: Payer: Self-pay

## 2021-08-22 ENCOUNTER — Ambulatory Visit (INDEPENDENT_AMBULATORY_CARE_PROVIDER_SITE_OTHER): Payer: Medicare Other | Admitting: Podiatry

## 2021-08-22 ENCOUNTER — Encounter: Payer: Self-pay | Admitting: Podiatry

## 2021-08-22 DIAGNOSIS — M05772 Rheumatoid arthritis with rheumatoid factor of left ankle and foot without organ or systems involvement: Secondary | ICD-10-CM | POA: Diagnosis not present

## 2021-08-22 DIAGNOSIS — L97511 Non-pressure chronic ulcer of other part of right foot limited to breakdown of skin: Secondary | ICD-10-CM | POA: Diagnosis not present

## 2021-08-22 DIAGNOSIS — I739 Peripheral vascular disease, unspecified: Secondary | ICD-10-CM | POA: Diagnosis not present

## 2021-08-22 DIAGNOSIS — M05771 Rheumatoid arthritis with rheumatoid factor of right ankle and foot without organ or systems involvement: Secondary | ICD-10-CM

## 2021-08-22 NOTE — Progress Notes (Signed)
°  Subjective:  Patient ID: Stacey [name], female    DOB: May 30, 1933,   MRN: [id number]  Chief Complaint  Patient presents with   Wound Check    F/U Rt 2nd toe ulcer check -pt states," looks better and less pain/redness/swelling and no drainage." Tx: bandiad with iodosorb     88 y.o. female presents for follow-up of right second digit wound. States she has been putting iodosorb on the wound and dressing regularly. Relates it is doing a lot better after the hospital.  Denies any other pedal complaints. Denies n/v/f/c.   Past Medical History:  Diagnosis Date   Atrial fibrillation ([facility])    Hernia of abdominal cavity    Kyphosis     Objective:  Physical Exam: Vascular: DP/PT pulses 2/4 bilateral. CFT <3 seconds. Normal hair growth on digits. No edema.  Skin. No lacerations or abrasions bilateral feet. Healed ulcer to right second digit.  .Mild erythema surrounding, improved.  Some erythema noted to distal right hallux. Some wearing of skin on right heel.  Musculoskeletal: MMT 5/5 bilateral lower extremities in DF, PF, Inversion and Eversion. Deceased ROM in DF of ankle joint. Tender to wound area.  Neurological: Sensation intact to light touch.   Assessment:   1. Skin ulcer of second toe of right foot, limited to breakdown of skin (HCC)   2. Rheumatoid arthritis involving both feet with positive rheumatoid factor (HCC)   3. PAD (peripheral artery disease) (H[facility])         Plan:  Patient was evaluated and treated and all questions answered. Ulcer right second digit  nearly healed  -Dressed with iodosorb, DSD.  -Off-loading with surgical shoe. -May discontinue antibiotics.  -Continue dressing changes and bandaids to protect prominent areas.  -Follow-up in 4 weeks for re-evaluation.     No follow-ups on file.   Louann Sjogren, DPM

## 2021-08-26 LAB — POCT INR: INR: 1.9 — AB (ref 2.0–3.0)

## 2021-08-27 ENCOUNTER — Ambulatory Visit (INDEPENDENT_AMBULATORY_CARE_PROVIDER_SITE_OTHER): Payer: Medicare Other

## 2021-08-27 DIAGNOSIS — Z7901 Long term (current) use of anticoagulants: Secondary | ICD-10-CM

## 2021-08-27 NOTE — Patient Instructions (Addendum)
Pre visit review using our clinic review tool, if applicable. No additional management support is needed unless otherwise documented below in the visit note.  Increase dose today to take 1 1/2 tablets and then continue 1/2 tablet daily except take 1 tablet on Mon, Wed, and Fri. Recheck in 2 wks.

## 2021-08-27 NOTE — Progress Notes (Addendum)
Increase dose today to take 1 1/2 tablets and then continue 1/2 tablet daily except take 1 tablet on Mon, Wed, and Fri. Recheck in 2 wks.

## 2021-08-29 DIAGNOSIS — Z20822 Contact with and (suspected) exposure to covid-19: Secondary | ICD-10-CM | POA: Diagnosis not present

## 2021-08-31 ENCOUNTER — Ambulatory Visit (INDEPENDENT_AMBULATORY_CARE_PROVIDER_SITE_OTHER): Payer: Medicare Other

## 2021-08-31 VITALS — Ht 63.0 in | Wt 97.0 lb

## 2021-08-31 DIAGNOSIS — Z0000 Encounter for general adult medical examination without abnormal findings: Secondary | ICD-10-CM | POA: Diagnosis not present

## 2021-08-31 NOTE — Progress Notes (Signed)
Subjective:   Stacey [name] is a 88 y.o. female who presents for Medicare Annual (Subsequent) preventive examination.  Review of Systems    No ROS       Objective:    Today's Vitals   08/31/21 1436  Weight: 97 lb (44 kg)  Height: 5\' 3"  (1.6 m)   Body mass index is 17.18 kg/m.  Advanced Directives 07/16/2021 08/30/2020 05/22/2019 07/28/2017  Does Patient Have a Medical Advance Directive? Yes Yes No No  Type of Paramedic of [place];Living will [place];Living will - -  Does patient want to make changes to medical advance directive? - No - Patient declined - -  Copy of [place] in Chart? - No - copy requested - -  Would patient like information on creating a medical advance directive? - - No - Patient declined No - Patient declined    Current Medications (verified) Outpatient Encounter Medications as of 08/31/2021  Medication Sig   atorvastatin (LIPITOR) 10 MG tablet TAKE 1 TABLET DAILY   atorvastatin (LIPITOR) 10 MG tablet 1 tablet   BESIVANCE 0.6 % SUSP Apply to eye. (Patient not taking: Reported on 07/11/2021)   cephALEXin (KEFLEX) 500 MG capsule Take 1 capsule (500 mg total) by mouth 2 (two) times daily.   cyanocobalamin 1000 MCG tablet Take 1,000 mcg by mouth daily.   desloratadine (CLARINEX) 5 MG tablet 1 tablet (Patient not taking: Reported on 07/11/2021)   diclofenac Sodium (VOLTAREN) 1 % GEL as needed.   diclofenac Sodium (VOLTAREN) 1 % GEL See admin instructions.   DUREZOL 0.05 % EMUL Apply to eye. (Patient not taking: Reported on 07/11/2021)   fexofenadine (ALLEGRA) 180 MG tablet TAKE ONE-HALF (1/2) TABLET DAILY FOR ALLERGIES (Patient taking differently: Take 90 mg by mouth daily.)   gabapentin (NEURONTIN) 400 MG capsule TAKE 1 CAPSULE DAILY (Patient taking differently: Take 400 mg by mouth.)   meclizine (ANTIVERT) 12.5 MG tablet Take 1 tablet (12.5 mg total) by mouth 3 (three) times daily as  needed for dizziness.   metoprolol tartrate (LOPRESSOR) 50 MG tablet TAKE 1 TABLET TWICE A DAY   Multiple Vitamin (MULTIVITAMIN) capsule Take 1 capsule by mouth daily.   mupirocin cream (BACTROBAN) 2 % Apply fingertip amount to wound area daily.   mupirocin ointment (BACTROBAN) 2 % Apply to wound once daily with light dressing.   omeprazole (PRILOSEC) 20 MG capsule TAKE 1 CAPSULE TWICE A DAY BEFORE MEALS   omeprazole (PRILOSEC) 20 MG capsule 1 capsule 30 minutes before morning meal   ondansetron (ZOFRAN-ODT) 4 MG disintegrating tablet 4mg  ODT q4 hours prn nausea/vomit   Potassium Chloride ER 20 MEQ TBCR 1 tablet   potassium chloride SA (KLOR-CON) 20 MEQ tablet Take 1 tablet (20 mEq total) by mouth daily.   predniSONE (DELTASONE) 5 MG tablet Take 1 tablet (5 mg total) by mouth daily with breakfast.   PROLENSA 0.07 % SOLN Apply to eye.   sertraline (ZOLOFT) 100 MG tablet TAKE 1 TABLET DAILY   spironolactone (ALDACTONE) 25 MG tablet TAKE ONE-HALF (1/2) TABLET DAILY   torsemide (DEMADEX) 20 MG tablet TAKE 2 TABLETS DAILY   traMADol (ULTRAM) 50 MG tablet 1-2 tabs   traMADol (ULTRAM) 50 MG tablet TAKE 2 TABLETS TWICE A DAY AS NEEDED   Vitamin D, Cholecalciferol, 25 MCG (1000 UT) TABS Take 1,000 Units by mouth daily.   Vitamin D, Cholecalciferol, 25 MCG (1000 UT) TABS 1 tablet   warfarin (COUMADIN) 5 MG tablet  TAKE ONE-HALF (1/2) TABLET (2.5 MG) ON WEDNESDAY AND SATURDAY. 1 TABLET (5 MG) ALL OTHER DAYS   No facility-administered encounter medications on file as of 08/31/2021.    Allergies (verified) Sulfa antibiotics, Tape, Codeine, Shellfish-derived products, Latex, Neosporin original [bacitracin-neomycin-polymyxin], and Polysporin [bacitracin-polymyxin b]   History: Past Medical History:  Diagnosis Date   Atrial fibrillation (HCC)    Hernia of abdominal cavity    Kyphosis    Past Surgical History:  Procedure Laterality Date   ABDOMINAL HYSTERECTOMY     CATARACT EXTRACTION, BILATERAL      TONSILLECTOMY     Family History  Problem Relation Age of Onset   Healthy Mother    Heart failure Father    Social History   Socioeconomic History   Marital status: Widowed    Spouse name: Not on file   Number of children: Not on file   Years of education: Not on file   Highest education level: Not on file  Occupational History   Not on file  Tobacco Use   Smoking status: Former   Smokeless tobacco: Never  Vaping Use   Vaping Use: Never used  Substance and Sexual Activity   Alcohol use: No   Drug use: No   Sexual activity: Not on file  Other Topics Concern   Not on file  Social History Narrative   Not on file   Social Determinants of Health   Financial Resource Strain: Low Risk    Difficulty of Paying Living Expenses: Not hard at all  Food Insecurity: No Food Insecurity   Worried About Charity fundraiser in the Last Year: Never true   [place] in the Last Year: Never true  Transportation Needs: No Transportation Needs   Lack of Transportation (Medical): No   Lack of Transportation (Non-Medical): No  Physical Activity: Inactive   Days of Exercise per Week: 0 days   Minutes of Exercise per Session: 0 min  Stress: No Stress Concern Present   Feeling of Stress : Not at all  Social Connections: Socially Isolated   Frequency of Communication with Friends and Family: More than three times a week   Frequency of Social Gatherings with Friends and Family: More than three times a week   Attends Religious Services: Never   Marine scientist or Organizations: No   Attends Archivist Meetings: Never   Marital Status: Widowed    Clinical Intake:  Pre-visit preparation completed: Yes  Diabetic? No  Interpreter Needed?: No Activities of Daily Living In your present state of health, do you have any difficulty performing the following activities: 08/31/2021  Hearing? N  Vision? N  Difficulty concentrating or making decisions? Y  Comment  Daughter Assist  Walking or climbing stairs? Y  Comment Patient has Scoliosis  Dressing or bathing? Y  Comment Daughter Assist  Doing errands, shopping? N  Comment [place] and eating ? [place] prepares food  Using the Toilet? N  In the past six months, have you accidently leaked urine? Y  Comment Patient wears Adult  Diapers  Do you have problems with loss of bowel control? Y  Comment Patient wears adult diapers  Managing your Medications? N  Managing your Finances? (No Data)  Comment Daughter assist  Housekeeping or managing your Housekeeping? Y  Comment Nurse aid assist  Some recent data might be hidden    Patient Care Team: Billie Ruddy, MD as PCP - General (  Family Medicine) Belva Crome, MD as PCP - Cardiology (Cardiology)  Indicate any recent Medical Services you may have received from other than [facility] providers in the past year (date may be approximate).     Assessment:   This is a routine wellness examination for [place].  Virtual Visit via Telephone Note  I connected with  Stacey [name] on 08/31/21 at  2:30 PM EST by telephone and verified that I am speaking with the correct person using two identifiers.  Location: Patient: Home Provider: Office Persons participating in the virtual visit: patient/Nurse Health Advisor   I discussed the limitations, risks, security and privacy concerns of performing an evaluation and management service by telephone and the availability of in person appointments. The patient expressed understanding and agreed to proceed.  Interactive audio and video telecommunications were attempted between this nurse and patient, however failed, due to patient having technical difficulties OR patient did not have access to video capability.  We continued and completed visit with audio only.  Some vital signs may be absent or patient reported.   Stacey Peaches, LPN   Hearing/Vision screen Hearing  Screening - Comments:: No difficulty hearing Vision Screening - Comments:: Wears glasses. Follow[facility]  Dietary issues and exercise activities discussed: Current Exercise Habits: The patient does not participate in regular exercise at present, Exercise limited by: Other - see comments (Patient has Scoliosis)   Goals Addressed   None    Depression Screen PHQ 2/9 Scores 08/31/2021 07/11/2021 08/30/2020 07/11/2017  PHQ - 2 Score 0 1 0 0  PHQ- 9 Score 0 7 0 -    Fall Risk Fall Risk  08/31/2021 07/11/2021 08/30/2020 07/11/2017  Falls in the past year? 1 1 0 Yes  Number falls in past yr: 1 1 0 2 or more  Injury with Fall? 1 0 0 Yes  Comment Patient fell injured back. Followed by Urgent care - - -  Risk for fall due to : - - History of fall(s);Impaired balance/gait;Impaired mobility -  Follow up - - Falls evaluation completed;Falls prevention discussed -    FALL RISK PREVENTION PERTAINING TO THE HOME:  Any stairs in or around the home? Yes  If so, are there any without handrails? No  Home free of loose throw rugs in walkways, pet beds, electrical cords, etc? Yes  Adequate lighting in your home to reduce risk of falls? Yes   ASSISTIVE DEVICES UTILIZED TO PREVENT FALLS:  Life alert? No  Use of a cane, walker or w/c? Yes  Grab bars in the bathroom? Yes  Shower chair or bench in shower? Yes  Elevated toilet seat or a handicapped toilet? No   TIMED UP AND GO:  Was the test performed? No . Audio Visit  Cognitive Function:     6CIT Screen 08/31/2021 08/30/2020  What Year? 0 points 0 points  What month? 0 points 0 points  What time? 0 points 0 points  Count back from 20 0 points 0 points  Months in reverse - 0 points  Repeat phrase - 4 points  Total Score - 4    Immunizations Immunization History  Administered Date(s) Administered   Pneumococcal Polysaccharide-23 08/30/2020    T-Dap: Patient deferred   Flu Vaccine status: Declined, Education has been  provided regarding the importance of this vaccine but patient still declined. Advised may receive this vaccine at local pharmacy or Health Dept. Aware to provide a copy of the vaccination record if obtained from local  pharmacy or Health Dept. Verbalized acceptance and understanding.  Pneumococcal vaccine status: Declined,  Education has been provided regarding the importance of this vaccine but patient still declined. Advised may receive this vaccine at local pharmacy or Health Dept. Aware to provide a copy of the vaccination record if obtained from local pharmacy or Health Dept. Verbalized acceptance and understanding.   Covid-19 vaccine status: Declined, Education has been provided regarding the importance of this vaccine but patient still declined. Advised may receive this vaccine at local pharmacy or Health Dept.or vaccine clinic. Aware to provide a copy of the vaccination record if obtained from local pharmacy or Health Dept. Verbalized acceptance and understanding.  Qualifies for Shingles Vaccine? Yes   Zostavax completed No   Shingrix Completed?: No.    Education has been provided regarding the importance of this vaccine. Patient has been advised to call insurance company to determine out of pocket expense if they have not yet received this vaccine. Advised may also receive vaccine at local pharmacy or Health Dept. Verbalized acceptance and understanding.  Screening Tests Health Maintenance  Topic Date Due   Pneumonia Vaccine 38+ Years old (2 - PCV) 08/30/2021   COVID-19 Vaccine (1) 09/16/2021 (Originally 07/08/1933)   INFLUENZA VACCINE  11/09/2021 (Originally 03/12/2021)   Zoster Vaccines- Shingrix (1 of 2) 11/29/2021 (Originally 01/04/1985)   DEXA SCAN  08/31/2022 (Originally 01/05/2000)   TETANUS/TDAP  08/31/2022 (Originally 01/04/1952)   HPV VACCINES  Aged Out    Health Maintenance  Health Maintenance Due  Topic Date Due   Pneumonia Vaccine 59+ Years old (2 - PCV) 08/30/2021     Additional Screening:   Vision Screening: Recommended annual ophthalmology exams for early detection of glaucoma and other disorders of the eye. Is the patient up to date with their annual eye exam?  Yes  Who is the provider or what is the name of the office in which the patient attends annual eye exams? Followed by [facility] Screening: Recommended annual dental exams for proper oral hygiene  Community Resource Referral / Chronic Care Management:  CRR required this visit?  No   CCM required this visit?  No      Plan:     I have personally reviewed and noted the following in the patients chart:   Medical and social history Use of alcohol, tobacco or illicit drugs  Current medications and supplements including opioid prescriptions. Patient currently taking opioids Functional ability and status Nutritional status Physical activity Advanced directives List of other physicians Hospitalizations, surgeries, and ER visits in previous 12 months Vitals Screenings to include cognitive, depression, and falls Referrals and appointments  In addition, I have reviewed and discussed with patient certain preventive protocols, quality metrics, and best practice recommendations. A written personalized care plan for preventive services as well as general preventive health recommendations were provided to patient.     TERRICKA BENDICK, LPN   [id number]    Nurse Note:   Patient would like f/u with concerns of continued weight loss.

## 2021-08-31 NOTE — Patient Instructions (Addendum)
Stacey [name] , Thank you for taking time to come for your Medicare Wellness Visit. I appreciate your ongoing commitment to your health goals. Please review the following plan we discussed and let me know if I can assist you in the future.   These are the goals we discussed:  Goals      Exercise 3x per week (30 min per time)        This is a list of the screening recommended for you and due dates:  Health Maintenance  Topic Date Due   Pneumonia Vaccine (2 - PCV) 08/30/2021   COVID-19 Vaccine (1) 09/16/2021*   Flu Shot  11/09/2021*   Zoster (Shingles) Vaccine (1 of 2) 11/29/2021*   DEXA scan (bone density measurement)  08/31/2022*   Tetanus Vaccine  08/31/2022*   HPV Vaccine  Aged Out  *Topic was postponed. The date shown is not the original due date.    Opioid Pain Medicine Management Opioids are powerful medicines that are used to treat moderate to severe pain. When used for short periods of time, they can help you to: Sleep better. Do better in physical or occupational therapy. Feel better in the first few days after an injury. Recover from surgery. Opioids should be taken with the supervision of a trained health care provider. They should be taken for the shortest period of time possible. This is because opioids can be addictive, and the longer you take opioids, the greater your risk of addiction. This addiction can also be called opioid use disorder. What are the risks? Using opioid pain medicines for longer than 3 days increases your risk of side effects. Side effects include: Constipation. Nausea and vomiting. Breathing difficulties (respiratory depression). Drowsiness. Confusion. Opioid use disorder. Itching. Taking opioid pain medicine for a long period of time can affect your ability to do daily tasks. It also puts you at risk for: Motor vehicle crashes. Depression. Suicide. Heart attack. Overdose, which can be life-threatening. What is a pain treatment plan? A  pain treatment plan is an agreement between you and your health care provider. Pain is unique to each person, and treatments vary depending on your condition. To manage your pain, you and your health care provider need to work together. To help you do this: Discuss the goals of your treatment, including how much pain you might expect to have and how you will manage the pain. Review the risks and benefits of taking opioid medicines. Remember that a good treatment plan uses more than one approach and minimizes the chance of side effects. Be honest about the amount of medicines you take and about any drug or alcohol use. Get pain medicine prescriptions from only one health care provider. Pain can be managed with many types of alternative treatments. Ask your health care provider to refer you to one or more specialists who can help you manage pain through: Physical or occupational therapy. Counseling (cognitive behavioral therapy). Good nutrition. Biofeedback. Massage. Meditation. Non-opioid medicine. Following a gentle exercise program. How to use opioid pain medicine Taking medicine Take your pain medicine exactly as told by your health care provider. Take it only when you need it. If your pain gets less severe, you may take less than your prescribed dose if your health care provider approves. If you are not having pain, do nottake pain medicine unless your health care provider tells you to take it. If your pain is severe, do nottry to treat it yourself by taking more pills than instructed on your  prescription. Contact your health care provider for help. Write down the times when you take your pain medicine. It is easy to become confused while on pain medicine. Writing the time can help you avoid overdose. Take other over-the-counter or prescription medicines only as told by your health care provider. Keeping yourself and others safe  While you are taking opioid pain medicine: Do not drive,  use machinery, or power tools. Do not sign legal documents. Do not drink alcohol. Do not take sleeping pills. Do not supervise children by yourself. Do not do activities that require climbing or being in high places. Do not go to a lake, river, ocean, spa, or swimming pool. Do not share your pain medicine with anyone. Keep pain medicine in a locked cabinet or in a secure area where pets and children cannot reach it. Stopping your use of opioids If you have been taking opioid medicine for more than a few weeks, you may need to slowly decrease (taper) how much you take until you stop completely. Tapering your use of opioids can decrease your risk of symptoms of withdrawal, such as: Pain and cramping in the abdomen. Nausea. Sweating. Sleepiness. Restlessness. Uncontrollable shaking (tremors). Cravings for the medicine. Do not attempt to taper your use of opioids on your own. Talk with your health care provider about how to do this. Your health care provider may prescribe a step-down schedule based on how much medicine you are taking and how long you have been taking it. Getting rid of leftover pills Do not save any leftover pills. Get rid of leftover pills safely by: Taking the medicine to a prescription take-back program. This is usually offered by the county or law enforcement. Bringing them to a pharmacy that has a drug disposal container. Flushing them down the toilet. Check the label or package insert of your medicine to see whether this is safe to do. Throwing them out in the trash. Check the label or package insert of your medicine to see whether this is safe to do. If it is safe to throw it out, remove the medicine from the original container, put it into a sealable bag or container, and mix it with used coffee grounds, food scraps, dirt, or cat litter before putting it in the trash. Follow these instructions at home: Activity Do exercises as told by your health care provider. Avoid  activities that make your pain worse. Return to your normal activities as told by your health care provider. Ask your health care provider what activities are safe for you. General instructions You may need to take these actions to prevent or treat constipation: Drink enough fluid to keep your urine pale yellow. Take over-the-counter or prescription medicines. Eat foods that are high in fiber, such as beans, whole grains, and fresh fruits and vegetables. Limit foods that are high in fat and processed sugars, such as fried or sweet foods. Keep all follow-up visits. This is important. Where to find support If you have been taking opioids for a long time, you may benefit from receiving support for quitting from a local support group or counselor. Ask your health care provider for a referral to these resources in your area. Where to find more information Centers for Disease Control and Prevention (CDC): FootballExhibition.com.br [place] Food and Drug Administration (FDA): PumpkinSearch.com.ee Get help right away if: You may have taken too much of an opioid (overdosed). Common symptoms of an overdose: Your breathing is slower or more shallow than normal. You have a very  slow heartbeat (pulse). You have slurred speech. You have nausea and vomiting. Your pupils become very small. You have other potential symptoms: You are very confused. You faint or feel like you will faint. You have cold, clammy skin. You have blue lips or fingernails. You have thoughts of harming yourself or harming others. These symptoms may represent a serious problem that is an emergency. Do not wait to see if the symptoms will go away. Get medical help right away. Call your local emergency services (911 in the [place] Do not drive yourself to the hospital.  If you ever feel like you may hurt yourself or others, or have thoughts about taking your own life, get help right away. Go to your nearest emergency department or: Call your local emergency  services (911 in the [place] Call the [facility] (858-365-6370 in the [place] Call a suicide crisis helpline, such as the National Suicide Prevention Lifeline at 678-754-8091 or 988 in the [place] This is open 24 hours a day in the [place] Text the Crisis Text Line at 629 508 4311 (in the [place] Summary Opioid medicines can help you manage moderate to severe pain for a short period of time. A pain treatment plan is an agreement between you and your health care provider. Discuss the goals of your treatment, including how much pain you might expect to have and how you will manage the pain. If you think that you or someone else may have taken too much of an opioid, get medical help right away. This information is not intended to replace advice given to you by your health care provider. Make sure you discuss any questions you have with your health care provider. Document Revised: 02/21/2021 Document Reviewed: 11/08/2020 Elsevier Patient Education  2022 Elsevier Inc.  Advanced directives: Yes  Conditions/risks identified: None  Next appointment: Follow up in one year for your annual wellness visit    Preventive Care 65 Years and Older, Female Preventive care refers to lifestyle choices and visits with your health care provider that can promote health and wellness. What does preventive care include? A yearly physical exam. This is also called an annual well check. Dental exams once or twice a year. Routine eye exams. Ask your health care provider how often you should have your eyes checked. Personal lifestyle choices, including: Daily care of your teeth and gums. Regular physical activity. Eating a healthy diet. Avoiding tobacco and drug use. Limiting alcohol use. Practicing safe sex. Taking low-dose aspirin every day. Taking vitamin and mineral supplements as recommended by your health care provider. What happens during an annual well check? The services and screenings done  by your health care provider during your annual well check will depend on your age, overall health, lifestyle risk factors, and family history of disease. Counseling  Your health care provider may ask you questions about your: Alcohol use. Tobacco use. Drug use. Emotional well-being. Home and relationship well-being. Sexual activity. Eating habits. History of falls. Memory and ability to understand (cognition). Work and work Astronomer. Reproductive health. Screening  You may have the following tests or measurements: Height, weight, and BMI. Blood pressure. Lipid and cholesterol levels. These may be checked every 5 years, or more frequently if you are over 84 years old. Skin check. Lung cancer screening. You may have this screening every year starting at age 89 if you have a 30-pack-year history of smoking and currently smoke or have quit within the past 15 years. Fecal occult blood test (FOBT) of the stool.  You may have this test every year starting at age 86. Flexible sigmoidoscopy or colonoscopy. You may have a sigmoidoscopy every 5 years or a colonoscopy every 10 years starting at age 86. Hepatitis C blood test. Hepatitis B blood test. Sexually transmitted disease (STD) testing. Diabetes screening. This is done by checking your blood sugar (glucose) after you have not eaten for a while (fasting). You may have this done every 1-3 years. Bone density scan. This is done to screen for osteoporosis. You may have this done starting at age 86. Mammogram. This may be done every 1-2 years. Talk to your health care provider about how often you should have regular mammograms. Talk with your health care provider about your test results, treatment options, and if necessary, the need for more tests. Vaccines  Your health care provider may recommend certain vaccines, such as: Influenza vaccine. This is recommended every year. Tetanus, diphtheria, and acellular pertussis (Tdap, Td) vaccine. You  may need a Td booster every 10 years. Zoster vaccine. You may need this after age 86. Pneumococcal 13-valent conjugate (PCV13) vaccine. One dose is recommended after age 86. Pneumococcal polysaccharide (PPSV23) vaccine. One dose is recommended after age 86. Talk to your health care provider about which screenings and vaccines you need and how often you need them. This information is not intended to replace advice given to you by your health care provider. Make sure you discuss any questions you have with your health care provider. Document Released: 08/25/2015 Document Revised: 04/17/2016 Document Reviewed: 05/30/2015 Elsevier Interactive Patient Education  2017 [employer]  Fall Prevention in the Home Falls can cause injuries. They can happen to people of all ages. There are many things you can do to make your home safe and to help prevent falls. What can I do on the outside of my home? Regularly fix the edges of walkways and driveways and fix any cracks. Remove anything that might make you trip as you walk through a door, such as a raised step or threshold. Trim any bushes or trees on the path to your home. Use bright outdoor lighting. Clear any walking paths of anything that might make someone trip, such as rocks or tools. Regularly check to see if handrails are loose or broken. Make sure that both sides of any steps have handrails. Any raised decks and porches should have guardrails on the edges. Have any leaves, snow, or ice cleared regularly. Use sand or salt on walking paths during winter. Clean up any spills in your garage right away. This includes oil or grease spills. What can I do in the bathroom? Use night lights. Install grab bars by the toilet and in the tub and shower. Do not use towel bars as grab bars. Use non-skid mats or decals in the tub or shower. If you need to sit down in the shower, use a plastic, non-slip stool. Keep the floor dry. Clean up any water that spills  on the floor as soon as it happens. Remove soap buildup in the tub or shower regularly. Attach bath mats securely with double-sided non-slip rug tape. Do not have throw rugs and other things on the floor that can make you trip. What can I do in the bedroom? Use night lights. Make sure that you have a light by your bed that is easy to reach. Do not use any sheets or blankets that are too big for your bed. They should not hang down onto the floor. Have a firm chair that has  side arms. You can use this for support while you get dressed. Do not have throw rugs and other things on the floor that can make you trip. What can I do in the kitchen? Clean up any spills right away. Avoid walking on wet floors. Keep items that you use a lot in easy-to-reach places. If you need to reach something above you, use a strong step stool that has a grab bar. Keep electrical cords out of the way. Do not use floor polish or wax that makes floors slippery. If you must use wax, use non-skid floor wax. Do not have throw rugs and other things on the floor that can make you trip. What can I do with my stairs? Do not leave any items on the stairs. Make sure that there are handrails on both sides of the stairs and use them. Fix handrails that are broken or loose. Make sure that handrails are as long as the stairways. Check any carpeting to make sure that it is firmly attached to the stairs. Fix any carpet that is loose or worn. Avoid having throw rugs at the top or bottom of the stairs. If you do have throw rugs, attach them to the floor with carpet tape. Make sure that you have a light switch at the top of the stairs and the bottom of the stairs. If you do not have them, ask someone to add them for you. What else can I do to help prevent falls? Wear shoes that: Do not have high heels. Have rubber bottoms. Are comfortable and fit you well. Are closed at the toe. Do not wear sandals. If you use a stepladder: Make  sure that it is fully opened. Do not climb a closed stepladder. Make sure that both sides of the stepladder are locked into place. Ask someone to hold it for you, if possible. Clearly mark and make sure that you can see: Any grab bars or handrails. First and last steps. Where the edge of each step is. Use tools that help you move around (mobility aids) if they are needed. These include: Canes. Walkers. Scooters. Crutches. Turn on the lights when you go into a dark area. Replace any light bulbs as soon as they burn out. Set up your furniture so you have a clear path. Avoid moving your furniture around. If any of your floors are uneven, fix them. If there are any pets around you, be aware of where they are. Review your medicines with your doctor. Some medicines can make you feel dizzy. This can increase your chance of falling. Ask your doctor what other things that you can do to help prevent falls. This information is not intended to replace advice given to you by your health care provider. Make sure you discuss any questions you have with your health care provider. Document Released: 05/25/2009 Document Revised: 01/04/2016 Document Reviewed: 09/02/2014 Elsevier Interactive Patient Education  2017 [employer].

## 2021-09-03 ENCOUNTER — Other Ambulatory Visit: Payer: Self-pay | Admitting: Family Medicine

## 2021-09-03 DIAGNOSIS — E782 Mixed hyperlipidemia: Secondary | ICD-10-CM

## 2021-09-18 ENCOUNTER — Ambulatory Visit (INDEPENDENT_AMBULATORY_CARE_PROVIDER_SITE_OTHER): Payer: Medicare Other

## 2021-09-18 DIAGNOSIS — Z7901 Long term (current) use of anticoagulants: Secondary | ICD-10-CM

## 2021-09-18 LAB — POCT INR: INR: 1.6 — AB (ref 2.0–3.0)

## 2021-09-18 NOTE — Patient Instructions (Addendum)
Pre visit review using our clinic review tool, if applicable. No additional management support is needed unless otherwise documented below in the visit note.  Increase dose today to take 1 tablet and then increase dose tomorrow to take 1 1/2 tablets and then change weekly dose to take 1 tablet daily except take 1/2 tablet on Monday, Wednesday andFriday. Recheck in 2 wks.

## 2021-09-18 NOTE — Progress Notes (Signed)
Increase dose today to take 1 tablet and then increase dose tomorrow to take 1 1/2 tablets and then change weekly dose to take 1 tablet daily except take 1/2 tablet on Monday, Wednesday andFriday. Recheck in 2 wks.

## 2021-09-19 ENCOUNTER — Ambulatory Visit (INDEPENDENT_AMBULATORY_CARE_PROVIDER_SITE_OTHER): Payer: Medicare Other

## 2021-09-19 ENCOUNTER — Ambulatory Visit (INDEPENDENT_AMBULATORY_CARE_PROVIDER_SITE_OTHER): Payer: Medicare Other | Admitting: Podiatry

## 2021-09-19 ENCOUNTER — Other Ambulatory Visit: Payer: Self-pay

## 2021-09-19 ENCOUNTER — Encounter: Payer: Self-pay | Admitting: Podiatry

## 2021-09-19 DIAGNOSIS — L97412 Non-pressure chronic ulcer of right heel and midfoot with fat layer exposed: Secondary | ICD-10-CM | POA: Diagnosis not present

## 2021-09-19 DIAGNOSIS — M05771 Rheumatoid arthritis with rheumatoid factor of right ankle and foot without organ or systems involvement: Secondary | ICD-10-CM

## 2021-09-19 DIAGNOSIS — I739 Peripheral vascular disease, unspecified: Secondary | ICD-10-CM

## 2021-09-19 DIAGNOSIS — M05772 Rheumatoid arthritis with rheumatoid factor of left ankle and foot without organ or systems involvement: Secondary | ICD-10-CM

## 2021-09-19 MED ORDER — DOXYCYCLINE HYCLATE 100 MG PO TABS: 100.0000 mg | ORAL_TABLET | Freq: Two times a day (BID) | ORAL | 0 refills | Status: AC

## 2021-09-19 NOTE — Progress Notes (Signed)
°  Subjective:  Patient ID: Stacey [name], female    DOB: Jun 22, 1933,   MRN: [id number]  Chief Complaint  Patient presents with   Wound Check     88 y.o. female presents for follow-up of right second digit wound. Second digit is doing well but the right heel opened up an ulcer 2 weeks ago and has worsened. She has been dressing with betadine but concerned for infection. Relates it is very painful  Denies any other pedal complaints. Denies n/v/f/c.   Past Medical History:  Diagnosis Date   Atrial fibrillation ([place])    Hernia of abdominal cavity    Kyphosis     Objective:  Physical Exam: Vascular: DP/PT pulses 2/4 bilateral. CFT <3 seconds. Normal hair growth on digits. No edema.  Skin. No lacerations or abrasions bilateral feet. Healed ulcer to right second digit.  .Mild erythema surrounding, improved.  Right heel with erythema and edema noted. Upon debridement 0.5 cm x 0.7 cm x 0.3 cm wound noted with granular base. Some purulence noted upon debridement.  Musculoskeletal: MMT 5/5 bilateral lower extremities in DF, PF, Inversion and Eversion. Deceased ROM in DF of ankle joint. Tender to wound area.  Neurological: Sensation intact to light touch.   Assessment:   1. Ulcer of right heel, with fat layer exposed ([place])   2. PAD (peripheral artery disease) ([place])   3. Rheumatoid arthritis involving both feet with positive rheumatoid factor ([place])         Plan:  Patient was evaluated and treated and all questions answered. Ulcer right second digit  healed, doing well. New ulcer to right heel with fat layer exposed. Cellulitis  -Dressed with silvadene DSD.  -Off-loading with sandals. Discussed using heel pads at night to protect the heels when in bed. Patient will get some online.  -Prescription for doxycylcine provided.  X-rays reviewed. No osseous changes noted.  -Continue dressing changes and bandaids to protect prominent areas of toes.    Procedure: Excisional Debridement of  Wound Rationale: Removal of non-viable soft tissue from the wound to promote healing.  Anesthesia: none Pre-Debridement Wound Measurements: Overlying callus  Post-Debridement Wound Measurements: 0.5 cm x 0.7 cm x 0.3 cm  Type of Debridement: Sharp Excisional Tissue Removed: Non-viable soft tissue Depth of Debridement: subcutaneous tissue. Technique: Sharp excisional debridement to bleeding, viable wound base.  Dressing: Dry, sterile, compression dressing. Disposition: Patient tolerated procedure well. Patient to return in 1 week for follow-up.  Return in about 1 week (around 09/26/2021) for wound check.  .   Return in about 1 week (around 09/26/2021) for wound check.   Lorenda Peck, DPM

## 2021-09-20 ENCOUNTER — Telehealth: Payer: Self-pay | Admitting: Family Medicine

## 2021-09-20 NOTE — Telephone Encounter (Signed)
Pt requesting a c/b regarding warfarin (COUMADIN) 5 MG tablet  pt states "it's about the conversation from yesterday"  Phone [phone]

## 2021-09-20 NOTE — Telephone Encounter (Signed)
Pt reported she has been started on doxycycline for 1 wk for a foot infection.  Advised pt to reduce dose today to 1/2 tablet and reduce dose on Saturday to 1/2 tablet and leave all other doses the same as instructed with last INR. Advised to recheck on 2/14. Pt verbalized understanding and wrote everything down and read it back.

## 2021-09-26 ENCOUNTER — Ambulatory Visit (INDEPENDENT_AMBULATORY_CARE_PROVIDER_SITE_OTHER): Payer: Medicare Other | Admitting: Podiatry

## 2021-09-26 ENCOUNTER — Telehealth: Payer: Self-pay

## 2021-09-26 ENCOUNTER — Other Ambulatory Visit: Payer: Self-pay

## 2021-09-26 ENCOUNTER — Encounter: Payer: Self-pay | Admitting: Podiatry

## 2021-09-26 DIAGNOSIS — M05772 Rheumatoid arthritis with rheumatoid factor of left ankle and foot without organ or systems involvement: Secondary | ICD-10-CM

## 2021-09-26 DIAGNOSIS — I739 Peripheral vascular disease, unspecified: Secondary | ICD-10-CM | POA: Diagnosis not present

## 2021-09-26 DIAGNOSIS — M05771 Rheumatoid arthritis with rheumatoid factor of right ankle and foot without organ or systems involvement: Secondary | ICD-10-CM

## 2021-09-26 DIAGNOSIS — L97412 Non-pressure chronic ulcer of right heel and midfoot with fat layer exposed: Secondary | ICD-10-CM | POA: Diagnosis not present

## 2021-09-26 NOTE — Telephone Encounter (Signed)
Contacted pt to remind her it is time for INR check. Pt reports her abx were extended for 1 more week due to her foot ulcer. Pt is having diarrhea with the abx but is staying well hydrated. She reports she contacted her daughter that it is time to check INR. She cannot make it until tomorrow due to work. Advised this is fine and that this nurse would f/u after the results were received. Advised if anything is needed before to contact the coumadin clinic. Pt verbalized understanding and was appreciative.

## 2021-09-26 NOTE — Progress Notes (Signed)
°  Subjective:  Patient ID: Stacey [name], female    DOB: [birth year],   MRN: [id number]  Chief Complaint  Patient presents with   Wound Check    Patient states she has noticed increased drainage come from open area to the right heel. Patient also reports and upset stomach that she believes is related to the antibiotic she is currently taking.      88 y.o. female presents for follow-up of right heel wound. Second digit is doing well. Right heel is improving but is still draining.  She has been dressing with iodosorb and avoiding shoes that put pressure on the area. Relates it is very painful  Denies any other pedal complaints. Denies n/v/f/c.   Past Medical History:  Diagnosis Date   Atrial fibrillation ([place])    Hernia of abdominal cavity    Kyphosis     Objective:  Physical Exam: Vascular: DP/PT pulses 2/4 bilateral. CFT <3 seconds. Normal hair growth on digits. No edema.  Skin. No lacerations or abrasions bilateral feet. Healed ulcer to right second digit.  .Mild erythema surrounding, improved.  Right heel with erythema and edema noted that has improved. Marland Kitchen Upon debridement 0.5 cm x 0.6 cm x 0.2 cm wound noted with granular base. No purulence noted.  Musculoskeletal: MMT 5/5 bilateral lower extremities in DF, PF, Inversion and Eversion. Deceased ROM in DF of ankle joint. Tender to wound area.  Neurological: Sensation intact to light touch.   Assessment:   No diagnosis found.       Plan:  Patient was evaluated and treated and all questions answered. Ulcer right second digit  healed, doing well. New ulcer to right heel with fat layer exposed. Cellulitis  -Dressed with silvadene DSD.  -Debridement as below.  -Off-loading with sandals. Discussed using heel pads at night to protect the heels when in bed. Patient will get some online.  -continue Doxycycline.  X-rays reviewed from preivous.  -Continue dressing changes and bandaids to protect prominent areas of toes.     Procedure: Excisional Debridement of Wound Rationale: Removal of non-viable soft tissue from the wound to promote healing.  Anesthesia: none Pre-Debridement Wound Measurements: Overlying callus  Post-Debridement Wound Measurements: 0.5 cm x 0.6 cm x 0.2cm  Type of Debridement: Sharp Excisional Tissue Removed: Non-viable soft tissue Depth of Debridement: subcutaneous tissue. Technique: Sharp excisional debridement to bleeding, viable wound base.  Dressing: Dry, sterile, compression dressing. Disposition: Patient tolerated procedure well. Patient to return in 1 week for follow-up.  No follow-ups on file.  .   No follow-ups on file.   Lorenda Peck, DPM

## 2021-09-29 DIAGNOSIS — Z7901 Long term (current) use of anticoagulants: Secondary | ICD-10-CM | POA: Diagnosis not present

## 2021-09-29 DIAGNOSIS — I4821 Permanent atrial fibrillation: Secondary | ICD-10-CM | POA: Diagnosis not present

## 2021-09-29 LAB — POCT INR: INR: 3.3 — AB (ref 2.0–3.0)

## 2021-10-02 ENCOUNTER — Ambulatory Visit (INDEPENDENT_AMBULATORY_CARE_PROVIDER_SITE_OTHER): Payer: Medicare Other

## 2021-10-02 DIAGNOSIS — Z7901 Long term (current) use of anticoagulants: Secondary | ICD-10-CM

## 2021-10-02 NOTE — Patient Instructions (Addendum)
Pre visit review using our clinic review tool, if applicable. No additional management support is needed unless otherwise documented below in the visit note.  Hold dose today and then continue 1 tablet daily except take 1/2 tablet on Mon, Wed, and Fri. Recheck in 2 weeks.

## 2021-10-02 NOTE — Progress Notes (Addendum)
Received INR result of 3.3 for 2/18 from [facility] which was not received until 8pm yesterday. Pt tests at home with daughter's help, and results go to [facility]. Pt has just finished an extended week of doxycycline. Hold dose today and then continue 1 tablet daily except take 1/2 tablet on Mon, Wed, and Fri. Recheck in 2 weeks.

## 2021-10-03 ENCOUNTER — Ambulatory Visit: Payer: Medicare Other | Admitting: Podiatry

## 2021-10-10 ENCOUNTER — Encounter: Payer: Self-pay | Admitting: Podiatry

## 2021-10-10 ENCOUNTER — Other Ambulatory Visit: Payer: Self-pay

## 2021-10-10 ENCOUNTER — Ambulatory Visit (INDEPENDENT_AMBULATORY_CARE_PROVIDER_SITE_OTHER): Payer: Medicare Other | Admitting: Podiatry

## 2021-10-10 DIAGNOSIS — L97412 Non-pressure chronic ulcer of right heel and midfoot with fat layer exposed: Secondary | ICD-10-CM | POA: Diagnosis not present

## 2021-10-10 DIAGNOSIS — I739 Peripheral vascular disease, unspecified: Secondary | ICD-10-CM | POA: Diagnosis not present

## 2021-10-10 DIAGNOSIS — M05771 Rheumatoid arthritis with rheumatoid factor of right ankle and foot without organ or systems involvement: Secondary | ICD-10-CM

## 2021-10-10 DIAGNOSIS — M05772 Rheumatoid arthritis with rheumatoid factor of left ankle and foot without organ or systems involvement: Secondary | ICD-10-CM

## 2021-10-10 NOTE — Progress Notes (Signed)
?  Subjective:  ?Patient ID: Sheniyah Buzzetta, female    DOB: 07-16-1933,   MRN: [id number] ? ?Chief Complaint  ?Patient presents with  ? Wound Check  ?  Right foot wound check , patient states her pain is constant , nerve pain and she thinks another ulcer is forming on the same heel  ? ? ? ?88 y.o. female presents for follow-up of right heel wound. Second digit is doing well. Right heel is improving but is still draining.  She has been dressing with iodosorb and avoiding shoes that put pressure on the area. Relates it is very painful. Concerned about her other heel as well and pressure at night.  Denies any other pedal complaints. Denies n/v/f/c.  ? ?Past Medical History:  ?Diagnosis Date  ? Atrial fibrillation (HCC)   ? Hernia of abdominal cavity   ? Kyphosis   ? ? ?Objective:  ?Physical Exam: ?Vascular: DP/PT pulses 2/4 bilateral. CFT <3 seconds. Normal hair growth on digits. No edema.  ?Skin. No lacerations or abrasions bilateral feet. Healed ulcer to right second digit.  .Mild erythema surrounding, improved.  Right heel with erythema and edema noted that has improved. Marland Kitchen Upon debridement 0.5 cm x 0.6 cm x 0.2 cm wound noted with granular base. No purulence noted.  ?Musculoskeletal: MMT 5/5 bilateral lower extremities in DF, PF, Inversion and Eversion. Deceased ROM in DF of ankle joint. Tender to wound area.  ?Neurological: Sensation intact to light touch.  ? ?Assessment:  ? ?1. Ulcer of right heel, with fat layer exposed (HCC)   ?2. PAD (peripheral artery disease) (HCC)   ?3. Rheumatoid arthritis involving both feet with positive rheumatoid factor (HCC)   ? ? ? ? ? ? ? ?Plan:  ?Patient was evaluated and treated and all questions answered. ?Ulcer right second digit  healed, doing well. New ulcer to right heel with fat layer exposed. Cellulitis  ?-Dressed with silvadene DSD.  ?-Debridement as below.  ?-Off-loading with sandals. Discussed using heel pads at night to protect the heels when in bed. Patient will get  some online.  ?-finish out doxycycline.  ?-Discussed again heel padding and dicsussed getting them to use at night. Try to dispense some today but we do not have them currently in the office.  ?[facility].  ?-Continue dressing changes and bandaids to protect prominent areas of toes.  ? ? ?Procedure: Excisional Debridement of Wound ?Rationale: Removal of non-viable soft tissue from the wound to promote healing.  ?Anesthesia: none ?Pre-Debridement Wound Measurements: Overlying callus  ?Post-Debridement Wound Measurements: 0.5 cm x 0.6 cm x 0.2cm  ?Type of Debridement: Sharp Excisional ?Tissue Removed: Non-viable soft tissue ?Depth of Debridement: subcutaneous tissue. ?Technique: Sharp excisional debridement to bleeding, viable wound base.  ?Dressing: Dry, sterile, compression dressing. ?Disposition: Patient tolerated procedure well. Patient to return in 2 week for follow-up. ? ?No follow-ups on file. ? ?. ? ? ?No follow-ups on file. ? ? ?Louann Sjogren, DPM  ? ? ?

## 2021-10-19 LAB — POCT INR: INR: 3 (ref 2.0–3.0)

## 2021-10-22 ENCOUNTER — Ambulatory Visit (INDEPENDENT_AMBULATORY_CARE_PROVIDER_SITE_OTHER): Payer: Medicare Other

## 2021-10-22 DIAGNOSIS — Z7901 Long term (current) use of anticoagulants: Secondary | ICD-10-CM | POA: Diagnosis not present

## 2021-10-22 NOTE — Patient Instructions (Signed)
Continue 1 tablet daily except take 1/2 tablet on Mon, Wed, and Fri. Recheck in 2 weeks.  ?

## 2021-10-22 NOTE — Progress Notes (Signed)
Continue 1 tablet daily except take 1/2 tablet on Mon, Wed, and Fri. Recheck in 2 weeks.  ?

## 2021-10-26 DIAGNOSIS — Z20822 Contact with and (suspected) exposure to covid-19: Secondary | ICD-10-CM | POA: Diagnosis not present

## 2021-10-27 DIAGNOSIS — Z20822 Contact with and (suspected) exposure to covid-19: Secondary | ICD-10-CM | POA: Diagnosis not present

## 2021-10-31 ENCOUNTER — Ambulatory Visit: Payer: Medicare Other | Admitting: Podiatry

## 2021-11-01 ENCOUNTER — Telehealth: Payer: Self-pay | Admitting: Family Medicine

## 2021-11-01 DIAGNOSIS — F32A Depression, unspecified: Secondary | ICD-10-CM

## 2021-11-01 MED ORDER — SERTRALINE HCL 100 MG PO TABS: 100.0000 mg | ORAL_TABLET | Freq: Every day | ORAL | 1 refills | Status: DC

## 2021-11-01 NOTE — Telephone Encounter (Signed)
Pt is calling and needs a refill on sertraline (ZOLOFT) 100 MG tablet  ?[facility] - [place], [place] - [address] Phone:  [phone]  ?Fax:  [phone]  ?  ? ?

## 2021-11-05 ENCOUNTER — Telehealth: Payer: Self-pay | Admitting: Family Medicine

## 2021-11-05 ENCOUNTER — Other Ambulatory Visit: Payer: Self-pay | Admitting: Family Medicine

## 2021-11-05 DIAGNOSIS — Z7901 Long term (current) use of anticoagulants: Secondary | ICD-10-CM

## 2021-11-05 DIAGNOSIS — G629 Polyneuropathy, unspecified: Secondary | ICD-10-CM

## 2021-11-05 MED ORDER — GABAPENTIN 400 MG PO CAPS: 400.0000 mg | ORAL_CAPSULE | Freq: Every day | ORAL | 1 refills | Status: DC

## 2021-11-05 NOTE — Telephone Encounter (Signed)
Patient called in requesting a refill for gabapentin (NEURONTIN) 400 MG capsule [[id number]] to be sent to her pharmacy. ? ?Please advise. ?

## 2021-11-09 DIAGNOSIS — Z20822 Contact with and (suspected) exposure to covid-19: Secondary | ICD-10-CM | POA: Diagnosis not present

## 2021-11-10 DIAGNOSIS — Z20822 Contact with and (suspected) exposure to covid-19: Secondary | ICD-10-CM | POA: Diagnosis not present

## 2021-11-13 ENCOUNTER — Ambulatory Visit: Payer: Medicare Other | Admitting: Podiatry

## 2021-11-13 LAB — POCT INR: INR: 2.3 (ref 2.0–3.0)

## 2021-11-14 ENCOUNTER — Ambulatory Visit (INDEPENDENT_AMBULATORY_CARE_PROVIDER_SITE_OTHER): Payer: Medicare Other

## 2021-11-14 DIAGNOSIS — Z7901 Long term (current) use of anticoagulants: Secondary | ICD-10-CM | POA: Diagnosis not present

## 2021-11-14 NOTE — Patient Instructions (Addendum)
Pre visit review using our clinic review tool, if applicable. No additional management support is needed unless otherwise documented below in the visit note. ? ?Continue 1 tablet daily except take 1/2 tablet on Mon, Wed, and Fri. Recheck in 2 weeks.  ?

## 2021-11-14 NOTE — Progress Notes (Signed)
Received INR result of 2.3 for 11/13/21 from [facility]. Pt tests at home with daughter's help, and results go to [facility]. ?Continue 1 tablet daily except take 1/2 tablet on Mon, Wed, and Fri. Recheck in 2 weeks.  ?Advised pt of dosing and if any changes to contact office. Pt verbalized understanding. ?

## 2021-11-19 DIAGNOSIS — Z20828 Contact with and (suspected) exposure to other viral communicable diseases: Secondary | ICD-10-CM | POA: Diagnosis not present

## 2021-11-20 ENCOUNTER — Ambulatory Visit (INDEPENDENT_AMBULATORY_CARE_PROVIDER_SITE_OTHER): Payer: Medicare Other | Admitting: Podiatry

## 2021-11-20 ENCOUNTER — Encounter: Payer: Self-pay | Admitting: Podiatry

## 2021-11-20 DIAGNOSIS — L97511 Non-pressure chronic ulcer of other part of right foot limited to breakdown of skin: Secondary | ICD-10-CM | POA: Diagnosis not present

## 2021-11-20 DIAGNOSIS — I739 Peripheral vascular disease, unspecified: Secondary | ICD-10-CM | POA: Diagnosis not present

## 2021-11-20 DIAGNOSIS — L97412 Non-pressure chronic ulcer of right heel and midfoot with fat layer exposed: Secondary | ICD-10-CM

## 2021-11-20 NOTE — Progress Notes (Signed)
?  Subjective:  ?Patient ID: Stacey [name], female    DOB: July 29, 1933,   MRN: [id number] ? ?Chief Complaint  ?Patient presents with  ? Wound Check  ? ? ? ?88 y.o. female presents for follow-up of right heel wound and second digit area.  Right heel is doing well. Right heel is improving but is still draining.  Second toe is started to be more painful. Has been appying iodosorb. . Denies any other pedal complaints. Denies n/v/f/c.  ? ?Past Medical History:  ?Diagnosis Date  ? Atrial fibrillation ([place])   ? Hernia of abdominal cavity   ? Kyphosis   ? ? ?Objective:  ?Physical Exam: ?Vascular: DP/PT pulses 2/4 bilateral. CFT <3 seconds. Normal hair growth on digits. No edema.  ?Skin. No lacerations or abrasions bilateral feet. Healed ulcer to right heel.  .Mild erythema surrounding, improved.  Right second digit . Upon debridement 0.5 cm x 0.5 cm x 0.1 cm wound noted with granular base. No purulence noted.  ?Musculoskeletal: MMT 5/5 bilateral lower extremities in DF, PF, Inversion and Eversion. Deceased ROM in DF of ankle joint. Tender to wound area.  ?Neurological: Sensation intact to light touch.  ? ?Assessment:  ? ?1. Skin ulcer of second toe of right foot, limited to breakdown of skin ([place])   ?2. Ulcer of right heel, with fat layer exposed ([place])   ?3. PAD (peripheral artery disease) ([place])   ? ? ? ? ? ? ? ?Plan:  ?Patient was evaluated and treated and all questions answered. ?Ulcer right second digit  limited to breakdown of skin, doing well. New ulcer to right heel healing well . Cellulitis  ?-Dressed with iodosorb DSD.  ?-Debridement as below.  ?-Off-loading with sandals. Discussed using heel pads at night to protect the heels when in bed. Patient will get some online.  ?-No abx necessary today.  ?-Discussed again heel padding and dicsussed getting them to use at night.  ?X-[facility] reviewed from [facility].  ?-Continue dressing changes and bandaids to protect prominent areas of toes.  ? ? ?Procedure: Excisional  Debridement of Wound ?Rationale: Removal of non-viable soft tissue from the wound to promote healing.  ?Anesthesia: none ?Pre-Debridement Wound Measurements: Overlying callus toe ?Post-Debridement Wound Measurements: 0.5 cm x 0.5 cm x 0.1cm  Superficial abrasion to right heel.  ?Type of Debridement: Sharp Excisional ?Tissue Removed: Non-viable soft tissue ?Depth of Debridement: subcutaneous tissue. ?Technique: Sharp excisional debridement to bleeding, viable wound base.  ?Dressing: Dry, sterile, compression dressing. ?Disposition: Patient tolerated procedure well. Patient to return in 2 week for follow-up. ? ?Return in about 4 weeks (around 12/18/2021) for wound check. ? ?. ? ? ?Return in about 4 weeks (around 12/18/2021) for wound check. ? ? ?Lorenda Peck, DPM  ? ? ?

## 2021-11-22 ENCOUNTER — Telehealth: Payer: Self-pay | Admitting: *Deleted

## 2021-11-22 ENCOUNTER — Telehealth: Payer: Self-pay | Admitting: Family Medicine

## 2021-11-22 NOTE — Telephone Encounter (Signed)
Error message

## 2021-11-22 NOTE — Telephone Encounter (Signed)
[facility] (Annette)is requesting notes from patient last office visit. They will have someone to come into facility for skilled nursing. ?Faxed [phone] notes from 10/10/21,confirmation received. ?

## 2021-11-22 NOTE — Telephone Encounter (Signed)
Drinda Butts from [facility] call and stated she need a call back today about pt.Drinda Butts # is [phone] ?

## 2021-11-23 ENCOUNTER — Telehealth: Payer: Self-pay | Admitting: *Deleted

## 2021-11-23 ENCOUNTER — Other Ambulatory Visit: Payer: Self-pay | Admitting: Podiatry

## 2021-11-23 DIAGNOSIS — L97511 Non-pressure chronic ulcer of other part of right foot limited to breakdown of skin: Secondary | ICD-10-CM

## 2021-11-23 DIAGNOSIS — I739 Peripheral vascular disease, unspecified: Secondary | ICD-10-CM

## 2021-11-23 DIAGNOSIS — L97412 Non-pressure chronic ulcer of right heel and midfoot with fat layer exposed: Secondary | ICD-10-CM

## 2021-11-23 NOTE — Telephone Encounter (Addendum)
Patient is calling because her  facility [facility]) will not allow her to stay there any longer with the wound ulcer which their nurses accessed at a stage 3 are only allowed to have a stage 1. She said that she has nowhere else to go. ?The daughter([phone]) would like to speak with physician to discuss. ?Spoke with Annette,[phone]) at the center and she confirmed the information but said that they are still working on this,bringing in a home heath nurse to evaluate as well,will contact our office.  ?[facility] called to request home health orders for patient's stage 3 wounds on B/L feet, please fax to :[phone] ?

## 2021-11-23 NOTE — Telephone Encounter (Signed)
Spoke to the daughter and will put in an order for home health. She only has stage 1 ulcers so she should not need to go anywhere else.

## 2021-11-25 DIAGNOSIS — Z20822 Contact with and (suspected) exposure to covid-19: Secondary | ICD-10-CM | POA: Diagnosis not present

## 2021-11-26 ENCOUNTER — Telehealth: Payer: Self-pay | Admitting: Family Medicine

## 2021-11-26 NOTE — Telephone Encounter (Signed)
Rep from [facility] ([facility]) called because the provider that placed the referral for wound care is a student and cannot sign for order. [facility] is wondering if Dr.Banks will sign orders for patient. ? ? ? ? ? ?Please advise  ? ? ? ? ?

## 2021-11-26 NOTE — Telephone Encounter (Signed)
[facility], stated patient had a wound but they are working with [facility], and did not need anything from this office. ?

## 2021-11-27 ENCOUNTER — Telehealth: Payer: Self-pay | Admitting: *Deleted

## 2021-11-27 NOTE — Telephone Encounter (Signed)
That will be fine, she has been doing well on her own thus far.

## 2021-11-27 NOTE — Telephone Encounter (Signed)
Called [facility] and let office know that PCP would not be signing orders due to referral coming from a different office. Stacey [name] verbalized understanding. Nothing further needed. ? ? ? ? ? ? ? ?

## 2021-11-27 NOTE — Telephone Encounter (Signed)
Stacey [name] w/ [facility] is wanting to let the physician know that patient is not wanting to continue with home health services,said that she can change the dressings herself.Please advise. ?

## 2021-11-28 DIAGNOSIS — Z9181 History of falling: Secondary | ICD-10-CM | POA: Diagnosis not present

## 2021-11-28 DIAGNOSIS — Z7901 Long term (current) use of anticoagulants: Secondary | ICD-10-CM | POA: Diagnosis not present

## 2021-11-28 DIAGNOSIS — L97511 Non-pressure chronic ulcer of other part of right foot limited to breakdown of skin: Secondary | ICD-10-CM | POA: Diagnosis not present

## 2021-11-28 DIAGNOSIS — L03115 Cellulitis of right lower limb: Secondary | ICD-10-CM | POA: Diagnosis not present

## 2021-11-28 DIAGNOSIS — L97412 Non-pressure chronic ulcer of right heel and midfoot with fat layer exposed: Secondary | ICD-10-CM | POA: Diagnosis not present

## 2021-11-28 DIAGNOSIS — I4891 Unspecified atrial fibrillation: Secondary | ICD-10-CM | POA: Diagnosis not present

## 2021-11-28 DIAGNOSIS — I739 Peripheral vascular disease, unspecified: Secondary | ICD-10-CM | POA: Diagnosis not present

## 2021-11-28 DIAGNOSIS — Z4800 Encounter for change or removal of nonsurgical wound dressing: Secondary | ICD-10-CM | POA: Diagnosis not present

## 2021-12-02 LAB — POCT INR: INR: 2.9 (ref 2.0–3.0)

## 2021-12-03 ENCOUNTER — Ambulatory Visit (INDEPENDENT_AMBULATORY_CARE_PROVIDER_SITE_OTHER): Payer: Medicare Other

## 2021-12-03 DIAGNOSIS — Z7901 Long term (current) use of anticoagulants: Secondary | ICD-10-CM | POA: Diagnosis not present

## 2021-12-03 DIAGNOSIS — L97511 Non-pressure chronic ulcer of other part of right foot limited to breakdown of skin: Secondary | ICD-10-CM | POA: Diagnosis not present

## 2021-12-03 DIAGNOSIS — I739 Peripheral vascular disease, unspecified: Secondary | ICD-10-CM | POA: Diagnosis not present

## 2021-12-03 DIAGNOSIS — L03115 Cellulitis of right lower limb: Secondary | ICD-10-CM | POA: Diagnosis not present

## 2021-12-03 DIAGNOSIS — L97412 Non-pressure chronic ulcer of right heel and midfoot with fat layer exposed: Secondary | ICD-10-CM | POA: Diagnosis not present

## 2021-12-03 DIAGNOSIS — I4891 Unspecified atrial fibrillation: Secondary | ICD-10-CM | POA: Diagnosis not present

## 2021-12-03 DIAGNOSIS — Z4800 Encounter for change or removal of nonsurgical wound dressing: Secondary | ICD-10-CM | POA: Diagnosis not present

## 2021-12-03 NOTE — Patient Instructions (Addendum)
Pre visit review using our clinic review tool, if applicable. No additional management support is needed unless otherwise documented below in the visit note. ? ?Continue 1 tablet daily except take 1/2 tablet on Mon, Wed, and Fri. Recheck in 2 weeks.  ?

## 2021-12-03 NOTE — Progress Notes (Addendum)
Received INR result of 2.9 for 12/02/21 from [facility]. Pt tests at home with daughter's help, and results go to [facility]. ?Continue 1 tablet daily except take 1/2 tablet on Mon, Wed, and Fri. Recheck in 2 weeks.  ?Advised continue normal dosing and if any changes to contact office. Pt and pt's daughter, Liborio NixonJanice, verbalized understanding.  ?

## 2021-12-06 DIAGNOSIS — I739 Peripheral vascular disease, unspecified: Secondary | ICD-10-CM | POA: Diagnosis not present

## 2021-12-06 DIAGNOSIS — Z4800 Encounter for change or removal of nonsurgical wound dressing: Secondary | ICD-10-CM | POA: Diagnosis not present

## 2021-12-06 DIAGNOSIS — L03115 Cellulitis of right lower limb: Secondary | ICD-10-CM | POA: Diagnosis not present

## 2021-12-06 DIAGNOSIS — L97511 Non-pressure chronic ulcer of other part of right foot limited to breakdown of skin: Secondary | ICD-10-CM | POA: Diagnosis not present

## 2021-12-06 DIAGNOSIS — L97412 Non-pressure chronic ulcer of right heel and midfoot with fat layer exposed: Secondary | ICD-10-CM | POA: Diagnosis not present

## 2021-12-06 DIAGNOSIS — I4891 Unspecified atrial fibrillation: Secondary | ICD-10-CM | POA: Diagnosis not present

## 2021-12-08 DIAGNOSIS — Z20822 Contact with and (suspected) exposure to covid-19: Secondary | ICD-10-CM | POA: Diagnosis not present

## 2021-12-10 DIAGNOSIS — Z4800 Encounter for change or removal of nonsurgical wound dressing: Secondary | ICD-10-CM | POA: Diagnosis not present

## 2021-12-10 DIAGNOSIS — L97511 Non-pressure chronic ulcer of other part of right foot limited to breakdown of skin: Secondary | ICD-10-CM | POA: Diagnosis not present

## 2021-12-10 DIAGNOSIS — I4891 Unspecified atrial fibrillation: Secondary | ICD-10-CM | POA: Diagnosis not present

## 2021-12-10 DIAGNOSIS — L97412 Non-pressure chronic ulcer of right heel and midfoot with fat layer exposed: Secondary | ICD-10-CM | POA: Diagnosis not present

## 2021-12-10 DIAGNOSIS — L03115 Cellulitis of right lower limb: Secondary | ICD-10-CM | POA: Diagnosis not present

## 2021-12-10 DIAGNOSIS — I739 Peripheral vascular disease, unspecified: Secondary | ICD-10-CM | POA: Diagnosis not present

## 2021-12-13 DIAGNOSIS — I739 Peripheral vascular disease, unspecified: Secondary | ICD-10-CM | POA: Diagnosis not present

## 2021-12-13 DIAGNOSIS — I4891 Unspecified atrial fibrillation: Secondary | ICD-10-CM | POA: Diagnosis not present

## 2021-12-13 DIAGNOSIS — L03115 Cellulitis of right lower limb: Secondary | ICD-10-CM | POA: Diagnosis not present

## 2021-12-13 DIAGNOSIS — L97511 Non-pressure chronic ulcer of other part of right foot limited to breakdown of skin: Secondary | ICD-10-CM | POA: Diagnosis not present

## 2021-12-13 DIAGNOSIS — Z4800 Encounter for change or removal of nonsurgical wound dressing: Secondary | ICD-10-CM | POA: Diagnosis not present

## 2021-12-13 DIAGNOSIS — L97412 Non-pressure chronic ulcer of right heel and midfoot with fat layer exposed: Secondary | ICD-10-CM | POA: Diagnosis not present

## 2021-12-14 DIAGNOSIS — Z20822 Contact with and (suspected) exposure to covid-19: Secondary | ICD-10-CM | POA: Diagnosis not present

## 2021-12-16 DIAGNOSIS — Z20822 Contact with and (suspected) exposure to covid-19: Secondary | ICD-10-CM | POA: Diagnosis not present

## 2021-12-17 DIAGNOSIS — I739 Peripheral vascular disease, unspecified: Secondary | ICD-10-CM | POA: Diagnosis not present

## 2021-12-17 DIAGNOSIS — L03115 Cellulitis of right lower limb: Secondary | ICD-10-CM | POA: Diagnosis not present

## 2021-12-17 DIAGNOSIS — Z20822 Contact with and (suspected) exposure to covid-19: Secondary | ICD-10-CM | POA: Diagnosis not present

## 2021-12-17 DIAGNOSIS — I4891 Unspecified atrial fibrillation: Secondary | ICD-10-CM | POA: Diagnosis not present

## 2021-12-17 DIAGNOSIS — Z4800 Encounter for change or removal of nonsurgical wound dressing: Secondary | ICD-10-CM | POA: Diagnosis not present

## 2021-12-17 DIAGNOSIS — L97511 Non-pressure chronic ulcer of other part of right foot limited to breakdown of skin: Secondary | ICD-10-CM | POA: Diagnosis not present

## 2021-12-17 DIAGNOSIS — L97412 Non-pressure chronic ulcer of right heel and midfoot with fat layer exposed: Secondary | ICD-10-CM | POA: Diagnosis not present

## 2021-12-18 DIAGNOSIS — Z20822 Contact with and (suspected) exposure to covid-19: Secondary | ICD-10-CM | POA: Diagnosis not present

## 2021-12-20 DIAGNOSIS — L97511 Non-pressure chronic ulcer of other part of right foot limited to breakdown of skin: Secondary | ICD-10-CM | POA: Diagnosis not present

## 2021-12-20 DIAGNOSIS — L03115 Cellulitis of right lower limb: Secondary | ICD-10-CM | POA: Diagnosis not present

## 2021-12-20 DIAGNOSIS — L97412 Non-pressure chronic ulcer of right heel and midfoot with fat layer exposed: Secondary | ICD-10-CM | POA: Diagnosis not present

## 2021-12-20 DIAGNOSIS — I739 Peripheral vascular disease, unspecified: Secondary | ICD-10-CM | POA: Diagnosis not present

## 2021-12-20 DIAGNOSIS — I4891 Unspecified atrial fibrillation: Secondary | ICD-10-CM | POA: Diagnosis not present

## 2021-12-20 DIAGNOSIS — Z4800 Encounter for change or removal of nonsurgical wound dressing: Secondary | ICD-10-CM | POA: Diagnosis not present

## 2021-12-23 DIAGNOSIS — Z7901 Long term (current) use of anticoagulants: Secondary | ICD-10-CM | POA: Diagnosis not present

## 2021-12-23 DIAGNOSIS — I4821 Permanent atrial fibrillation: Secondary | ICD-10-CM | POA: Diagnosis not present

## 2021-12-23 LAB — POCT INR: INR: 3 (ref 2.0–3.0)

## 2021-12-24 ENCOUNTER — Ambulatory Visit (INDEPENDENT_AMBULATORY_CARE_PROVIDER_SITE_OTHER): Payer: Medicare Other

## 2021-12-24 DIAGNOSIS — Z7901 Long term (current) use of anticoagulants: Secondary | ICD-10-CM | POA: Diagnosis not present

## 2021-12-24 DIAGNOSIS — L97412 Non-pressure chronic ulcer of right heel and midfoot with fat layer exposed: Secondary | ICD-10-CM | POA: Diagnosis not present

## 2021-12-24 DIAGNOSIS — I4891 Unspecified atrial fibrillation: Secondary | ICD-10-CM | POA: Diagnosis not present

## 2021-12-24 DIAGNOSIS — L03115 Cellulitis of right lower limb: Secondary | ICD-10-CM | POA: Diagnosis not present

## 2021-12-24 DIAGNOSIS — L97511 Non-pressure chronic ulcer of other part of right foot limited to breakdown of skin: Secondary | ICD-10-CM | POA: Diagnosis not present

## 2021-12-24 DIAGNOSIS — I739 Peripheral vascular disease, unspecified: Secondary | ICD-10-CM | POA: Diagnosis not present

## 2021-12-24 DIAGNOSIS — Z4800 Encounter for change or removal of nonsurgical wound dressing: Secondary | ICD-10-CM | POA: Diagnosis not present

## 2021-12-24 NOTE — Patient Instructions (Addendum)
Pre visit review using our clinic review tool, if applicable. No additional management support is needed unless otherwise documented below in the visit note. ? ?Continue 1 tablet daily except take 1/2 tablet on Mon, Wed, and Fri. Recheck in 3 weeks, 6/5. ?

## 2021-12-24 NOTE — Progress Notes (Addendum)
Received INR result of 3.0 for 12/23/21, Sunday, from [facility]. Pt tests at home with daughter's help, and results go to [facility]. ?Continue 1 tablet daily except take 1/2 tablet on Mon, Wed, and Fri. Recheck in 3 weeks due to holiday ?

## 2021-12-26 ENCOUNTER — Telehealth: Payer: Self-pay | Admitting: *Deleted

## 2021-12-26 ENCOUNTER — Encounter: Payer: Self-pay | Admitting: Podiatry

## 2021-12-26 ENCOUNTER — Ambulatory Visit (INDEPENDENT_AMBULATORY_CARE_PROVIDER_SITE_OTHER): Payer: Medicare Other | Admitting: Podiatry

## 2021-12-26 DIAGNOSIS — M05771 Rheumatoid arthritis with rheumatoid factor of right ankle and foot without organ or systems involvement: Secondary | ICD-10-CM | POA: Diagnosis not present

## 2021-12-26 DIAGNOSIS — M05772 Rheumatoid arthritis with rheumatoid factor of left ankle and foot without organ or systems involvement: Secondary | ICD-10-CM

## 2021-12-26 DIAGNOSIS — L97412 Non-pressure chronic ulcer of right heel and midfoot with fat layer exposed: Secondary | ICD-10-CM

## 2021-12-26 DIAGNOSIS — I739 Peripheral vascular disease, unspecified: Secondary | ICD-10-CM

## 2021-12-26 DIAGNOSIS — L97511 Non-pressure chronic ulcer of other part of right foot limited to breakdown of skin: Secondary | ICD-10-CM

## 2021-12-26 NOTE — Telephone Encounter (Signed)
Patient decline services per [employer]

## 2021-12-26 NOTE — Telephone Encounter (Signed)
Tresa Endo w/ [facility] [phone] calling to say that have been seeing patient since initial message left. ?Returned the call back to [place], left message for her since she was in a meeting, said that I will fax over patient's last office notes to review,call back with questions. ?Faxed 12/26/21,confirmation received. ?

## 2021-12-26 NOTE — Progress Notes (Signed)
?  Subjective:  ?Patient ID: Stacey [name], female    DOB: 1932-09-06,   MRN: [id number] ? ?No chief complaint on file. ? ? ? ?88 y.o. female presents for follow-up of right heel wound and second digit area.  Right heel is doing well. Right heel is improving but is still draining.  Second toe is started to be more painful. Has been appying iodosorb. . Denies any other pedal complaints. Denies n/v/f/c.  ? ?Past Medical History:  ?Diagnosis Date  ? Atrial fibrillation ([place])   ? Hernia of abdominal cavity   ? Kyphosis   ? ? ?Objective:  ?Physical Exam: ?Vascular: DP/PT pulses 2/4 bilateral. CFT <3 seconds. Normal hair growth on digits. No edema.  ?Skin. No lacerations or abrasions bilateral feet.Right heel ulcer about 0.2 cm x 0.2 cm  .Mild erythema surrounding, improved.  Right second digit . Upon debridement 0.3 cm x 0.5 cm x 0.1 cm wound noted with granular base. No purulence noted.  ?Musculoskeletal: MMT 5/5 bilateral lower extremities in DF, PF, Inversion and Eversion. Deceased ROM in DF of ankle joint. Tender to wound area.  ?Neurological: Sensation intact to light touch.  ? ?Assessment:  ? ?1. Ulcer of right heel, with fat layer exposed ([place])   ?2. PAD (peripheral artery disease) ([place])   ?3. Skin ulcer of second toe of right foot, limited to breakdown of skin ([place])   ?4. Rheumatoid arthritis involving both feet with positive rheumatoid factor (HCC)   ? ? ? ? ? ? ? ?Plan:  ?Patient was evaluated and treated and all questions answered. ?Ulcer right second digit  limited to breakdown of skin, doing well. New ulcer to right heel healing well . Cellulitis  ?-Dressed with iodosorb DSD.  ?-Debridement as below.  ?-Off-loading with sandals. Discussed using heel pads at night to protect the heels when in bed. Patient will get some online.  ?-No abx necessary today.  ?-Discussed again heel padding and dicsussed getting them to use at night.  ?X-[facility] reviewed from [facility].  ?-Continue dressing changes and bandaids to  protect prominent areas of toes.  ? ? ?Procedure: Excisional Debridement of Wound ?Rationale: Removal of non-viable soft tissue from the wound to promote healing.  ?Anesthesia: none ?Pre-Debridement Wound Measurements: Overlying callus toe ?Post-Debridement Wound Measurements: 0.3 cm x 0.5 cm x 0.1cm  Superficial abrasion to right heel. 0.2 cm x 0.2 cm  ?Type of Debridement: Sharp Excisional ?Tissue Removed: Non-viable soft tissue ?Depth of Debridement: subcutaneous tissue. ?Technique: Sharp excisional debridement to bleeding, viable wound base.  ?Dressing: Dry, sterile, compression dressing. ?Disposition: Patient tolerated procedure well. Patient to return in 2 week for follow-up. ? ?Return in about 4 weeks (around 01/23/2022) for wound check. ? ?. ? ? ?Return in about 4 weeks (around 01/23/2022) for wound check. ? ? ?Lorenda Peck, DPM  ? ?.  ?

## 2021-12-26 NOTE — Telephone Encounter (Signed)
Error message

## 2021-12-26 NOTE — Telephone Encounter (Signed)
Called [facility] and gave information per Dr Blenda Mounts. ?

## 2021-12-27 DIAGNOSIS — I4891 Unspecified atrial fibrillation: Secondary | ICD-10-CM | POA: Diagnosis not present

## 2021-12-27 DIAGNOSIS — L97412 Non-pressure chronic ulcer of right heel and midfoot with fat layer exposed: Secondary | ICD-10-CM | POA: Diagnosis not present

## 2021-12-27 DIAGNOSIS — L97511 Non-pressure chronic ulcer of other part of right foot limited to breakdown of skin: Secondary | ICD-10-CM | POA: Diagnosis not present

## 2021-12-27 DIAGNOSIS — Z4800 Encounter for change or removal of nonsurgical wound dressing: Secondary | ICD-10-CM | POA: Diagnosis not present

## 2021-12-27 DIAGNOSIS — I739 Peripheral vascular disease, unspecified: Secondary | ICD-10-CM | POA: Diagnosis not present

## 2021-12-27 DIAGNOSIS — L03115 Cellulitis of right lower limb: Secondary | ICD-10-CM | POA: Diagnosis not present

## 2021-12-27 NOTE — Telephone Encounter (Signed)
Patient calling to request lov summary faxed to [facility](attn: Annette),did not receive in office. Faxed office notes to :[phone],Attn: Annette.Confirmation received 12/27/21.

## 2021-12-28 DIAGNOSIS — L97412 Non-pressure chronic ulcer of right heel and midfoot with fat layer exposed: Secondary | ICD-10-CM | POA: Diagnosis not present

## 2021-12-28 DIAGNOSIS — Z4800 Encounter for change or removal of nonsurgical wound dressing: Secondary | ICD-10-CM | POA: Diagnosis not present

## 2021-12-28 DIAGNOSIS — I739 Peripheral vascular disease, unspecified: Secondary | ICD-10-CM | POA: Diagnosis not present

## 2021-12-28 DIAGNOSIS — Z9181 History of falling: Secondary | ICD-10-CM | POA: Diagnosis not present

## 2021-12-28 DIAGNOSIS — L97511 Non-pressure chronic ulcer of other part of right foot limited to breakdown of skin: Secondary | ICD-10-CM | POA: Diagnosis not present

## 2021-12-28 DIAGNOSIS — L03115 Cellulitis of right lower limb: Secondary | ICD-10-CM | POA: Diagnosis not present

## 2021-12-28 DIAGNOSIS — I4891 Unspecified atrial fibrillation: Secondary | ICD-10-CM | POA: Diagnosis not present

## 2021-12-28 DIAGNOSIS — Z7901 Long term (current) use of anticoagulants: Secondary | ICD-10-CM | POA: Diagnosis not present

## 2022-01-01 DIAGNOSIS — L03115 Cellulitis of right lower limb: Secondary | ICD-10-CM | POA: Diagnosis not present

## 2022-01-01 DIAGNOSIS — Z4800 Encounter for change or removal of nonsurgical wound dressing: Secondary | ICD-10-CM | POA: Diagnosis not present

## 2022-01-01 DIAGNOSIS — I739 Peripheral vascular disease, unspecified: Secondary | ICD-10-CM | POA: Diagnosis not present

## 2022-01-01 DIAGNOSIS — L97511 Non-pressure chronic ulcer of other part of right foot limited to breakdown of skin: Secondary | ICD-10-CM | POA: Diagnosis not present

## 2022-01-01 DIAGNOSIS — I4891 Unspecified atrial fibrillation: Secondary | ICD-10-CM | POA: Diagnosis not present

## 2022-01-01 DIAGNOSIS — L97412 Non-pressure chronic ulcer of right heel and midfoot with fat layer exposed: Secondary | ICD-10-CM | POA: Diagnosis not present

## 2022-01-03 DIAGNOSIS — I739 Peripheral vascular disease, unspecified: Secondary | ICD-10-CM | POA: Diagnosis not present

## 2022-01-03 DIAGNOSIS — L03115 Cellulitis of right lower limb: Secondary | ICD-10-CM | POA: Diagnosis not present

## 2022-01-03 DIAGNOSIS — Z4800 Encounter for change or removal of nonsurgical wound dressing: Secondary | ICD-10-CM | POA: Diagnosis not present

## 2022-01-03 DIAGNOSIS — I4891 Unspecified atrial fibrillation: Secondary | ICD-10-CM | POA: Diagnosis not present

## 2022-01-03 DIAGNOSIS — L97511 Non-pressure chronic ulcer of other part of right foot limited to breakdown of skin: Secondary | ICD-10-CM | POA: Diagnosis not present

## 2022-01-03 DIAGNOSIS — L97412 Non-pressure chronic ulcer of right heel and midfoot with fat layer exposed: Secondary | ICD-10-CM | POA: Diagnosis not present

## 2022-01-07 DIAGNOSIS — I4891 Unspecified atrial fibrillation: Secondary | ICD-10-CM | POA: Diagnosis not present

## 2022-01-07 DIAGNOSIS — Z4800 Encounter for change or removal of nonsurgical wound dressing: Secondary | ICD-10-CM | POA: Diagnosis not present

## 2022-01-07 DIAGNOSIS — L03115 Cellulitis of right lower limb: Secondary | ICD-10-CM | POA: Diagnosis not present

## 2022-01-07 DIAGNOSIS — I739 Peripheral vascular disease, unspecified: Secondary | ICD-10-CM | POA: Diagnosis not present

## 2022-01-07 DIAGNOSIS — L97511 Non-pressure chronic ulcer of other part of right foot limited to breakdown of skin: Secondary | ICD-10-CM | POA: Diagnosis not present

## 2022-01-07 DIAGNOSIS — L97412 Non-pressure chronic ulcer of right heel and midfoot with fat layer exposed: Secondary | ICD-10-CM | POA: Diagnosis not present

## 2022-01-08 DIAGNOSIS — H353132 Nonexudative age-related macular degeneration, bilateral, intermediate dry stage: Secondary | ICD-10-CM | POA: Diagnosis not present

## 2022-01-08 DIAGNOSIS — H52223 Regular astigmatism, bilateral: Secondary | ICD-10-CM | POA: Diagnosis not present

## 2022-01-08 DIAGNOSIS — Z9841 Cataract extraction status, right eye: Secondary | ICD-10-CM | POA: Diagnosis not present

## 2022-01-08 DIAGNOSIS — Z961 Presence of intraocular lens: Secondary | ICD-10-CM | POA: Diagnosis not present

## 2022-01-08 DIAGNOSIS — Z9842 Cataract extraction status, left eye: Secondary | ICD-10-CM | POA: Diagnosis not present

## 2022-01-10 ENCOUNTER — Other Ambulatory Visit: Payer: Self-pay

## 2022-01-10 DIAGNOSIS — I4891 Unspecified atrial fibrillation: Secondary | ICD-10-CM | POA: Diagnosis not present

## 2022-01-10 DIAGNOSIS — Z4800 Encounter for change or removal of nonsurgical wound dressing: Secondary | ICD-10-CM | POA: Diagnosis not present

## 2022-01-10 DIAGNOSIS — G629 Polyneuropathy, unspecified: Secondary | ICD-10-CM

## 2022-01-10 DIAGNOSIS — L97412 Non-pressure chronic ulcer of right heel and midfoot with fat layer exposed: Secondary | ICD-10-CM | POA: Diagnosis not present

## 2022-01-10 DIAGNOSIS — L97511 Non-pressure chronic ulcer of other part of right foot limited to breakdown of skin: Secondary | ICD-10-CM | POA: Diagnosis not present

## 2022-01-10 DIAGNOSIS — L03115 Cellulitis of right lower limb: Secondary | ICD-10-CM | POA: Diagnosis not present

## 2022-01-10 DIAGNOSIS — I739 Peripheral vascular disease, unspecified: Secondary | ICD-10-CM | POA: Diagnosis not present

## 2022-01-10 MED ORDER — GABAPENTIN 400 MG PO CAPS: 400.0000 mg | ORAL_CAPSULE | Freq: Every day | ORAL | 1 refills | Status: DC

## 2022-01-11 ENCOUNTER — Other Ambulatory Visit: Payer: Self-pay

## 2022-01-11 ENCOUNTER — Telehealth: Payer: Self-pay

## 2022-01-11 MED ORDER — PREDNISONE 5 MG PO TABS: 5.0000 mg | ORAL_TABLET | Freq: Every day | ORAL | 3 refills | Status: DC

## 2022-01-11 NOTE — Telephone Encounter (Signed)
Pt was to test INR at home on  6/5 but not nurse will be available. LVM with pt's daughter to push testing to 6/12.

## 2022-01-14 DIAGNOSIS — L97511 Non-pressure chronic ulcer of other part of right foot limited to breakdown of skin: Secondary | ICD-10-CM | POA: Diagnosis not present

## 2022-01-14 DIAGNOSIS — Z4800 Encounter for change or removal of nonsurgical wound dressing: Secondary | ICD-10-CM | POA: Diagnosis not present

## 2022-01-14 DIAGNOSIS — I4891 Unspecified atrial fibrillation: Secondary | ICD-10-CM | POA: Diagnosis not present

## 2022-01-14 DIAGNOSIS — L97412 Non-pressure chronic ulcer of right heel and midfoot with fat layer exposed: Secondary | ICD-10-CM | POA: Diagnosis not present

## 2022-01-14 DIAGNOSIS — L03115 Cellulitis of right lower limb: Secondary | ICD-10-CM | POA: Diagnosis not present

## 2022-01-14 DIAGNOSIS — I739 Peripheral vascular disease, unspecified: Secondary | ICD-10-CM | POA: Diagnosis not present

## 2022-01-15 ENCOUNTER — Other Ambulatory Visit: Payer: Self-pay | Admitting: Family Medicine

## 2022-01-17 DIAGNOSIS — I739 Peripheral vascular disease, unspecified: Secondary | ICD-10-CM | POA: Diagnosis not present

## 2022-01-17 DIAGNOSIS — Z4800 Encounter for change or removal of nonsurgical wound dressing: Secondary | ICD-10-CM | POA: Diagnosis not present

## 2022-01-17 DIAGNOSIS — L03115 Cellulitis of right lower limb: Secondary | ICD-10-CM | POA: Diagnosis not present

## 2022-01-17 DIAGNOSIS — I4891 Unspecified atrial fibrillation: Secondary | ICD-10-CM | POA: Diagnosis not present

## 2022-01-17 DIAGNOSIS — L97412 Non-pressure chronic ulcer of right heel and midfoot with fat layer exposed: Secondary | ICD-10-CM | POA: Diagnosis not present

## 2022-01-17 DIAGNOSIS — L97511 Non-pressure chronic ulcer of other part of right foot limited to breakdown of skin: Secondary | ICD-10-CM | POA: Diagnosis not present

## 2022-01-21 DIAGNOSIS — I739 Peripheral vascular disease, unspecified: Secondary | ICD-10-CM | POA: Diagnosis not present

## 2022-01-21 DIAGNOSIS — I4891 Unspecified atrial fibrillation: Secondary | ICD-10-CM | POA: Diagnosis not present

## 2022-01-21 DIAGNOSIS — L97412 Non-pressure chronic ulcer of right heel and midfoot with fat layer exposed: Secondary | ICD-10-CM | POA: Diagnosis not present

## 2022-01-21 DIAGNOSIS — L03115 Cellulitis of right lower limb: Secondary | ICD-10-CM | POA: Diagnosis not present

## 2022-01-21 DIAGNOSIS — L97511 Non-pressure chronic ulcer of other part of right foot limited to breakdown of skin: Secondary | ICD-10-CM | POA: Diagnosis not present

## 2022-01-21 DIAGNOSIS — Z4800 Encounter for change or removal of nonsurgical wound dressing: Secondary | ICD-10-CM | POA: Diagnosis not present

## 2022-01-23 ENCOUNTER — Other Ambulatory Visit: Payer: Self-pay | Admitting: Family Medicine

## 2022-01-23 ENCOUNTER — Ambulatory Visit: Payer: Medicare Other | Admitting: Podiatry

## 2022-01-24 DIAGNOSIS — Z4800 Encounter for change or removal of nonsurgical wound dressing: Secondary | ICD-10-CM | POA: Diagnosis not present

## 2022-01-24 DIAGNOSIS — L97412 Non-pressure chronic ulcer of right heel and midfoot with fat layer exposed: Secondary | ICD-10-CM | POA: Diagnosis not present

## 2022-01-24 DIAGNOSIS — L97511 Non-pressure chronic ulcer of other part of right foot limited to breakdown of skin: Secondary | ICD-10-CM | POA: Diagnosis not present

## 2022-01-24 DIAGNOSIS — I739 Peripheral vascular disease, unspecified: Secondary | ICD-10-CM | POA: Diagnosis not present

## 2022-01-24 DIAGNOSIS — L03115 Cellulitis of right lower limb: Secondary | ICD-10-CM | POA: Diagnosis not present

## 2022-01-24 DIAGNOSIS — I4891 Unspecified atrial fibrillation: Secondary | ICD-10-CM | POA: Diagnosis not present

## 2022-01-26 LAB — POCT INR: INR: 2.4 (ref 2.0–3.0)

## 2022-01-27 DIAGNOSIS — Z9181 History of falling: Secondary | ICD-10-CM | POA: Diagnosis not present

## 2022-01-27 DIAGNOSIS — M05771 Rheumatoid arthritis with rheumatoid factor of right ankle and foot without organ or systems involvement: Secondary | ICD-10-CM | POA: Diagnosis not present

## 2022-01-27 DIAGNOSIS — Z7952 Long term (current) use of systemic steroids: Secondary | ICD-10-CM | POA: Diagnosis not present

## 2022-01-27 DIAGNOSIS — L97412 Non-pressure chronic ulcer of right heel and midfoot with fat layer exposed: Secondary | ICD-10-CM | POA: Diagnosis not present

## 2022-01-27 DIAGNOSIS — I739 Peripheral vascular disease, unspecified: Secondary | ICD-10-CM | POA: Diagnosis not present

## 2022-01-27 DIAGNOSIS — Z7901 Long term (current) use of anticoagulants: Secondary | ICD-10-CM | POA: Diagnosis not present

## 2022-01-27 DIAGNOSIS — I4891 Unspecified atrial fibrillation: Secondary | ICD-10-CM | POA: Diagnosis not present

## 2022-01-27 DIAGNOSIS — L97511 Non-pressure chronic ulcer of other part of right foot limited to breakdown of skin: Secondary | ICD-10-CM | POA: Diagnosis not present

## 2022-01-27 DIAGNOSIS — L03115 Cellulitis of right lower limb: Secondary | ICD-10-CM | POA: Diagnosis not present

## 2022-01-27 DIAGNOSIS — Z4800 Encounter for change or removal of nonsurgical wound dressing: Secondary | ICD-10-CM | POA: Diagnosis not present

## 2022-01-27 DIAGNOSIS — M05772 Rheumatoid arthritis with rheumatoid factor of left ankle and foot without organ or systems involvement: Secondary | ICD-10-CM | POA: Diagnosis not present

## 2022-01-28 ENCOUNTER — Other Ambulatory Visit: Payer: Self-pay

## 2022-01-28 DIAGNOSIS — G629 Polyneuropathy, unspecified: Secondary | ICD-10-CM

## 2022-01-28 MED ORDER — GABAPENTIN 400 MG PO CAPS: 400.0000 mg | ORAL_CAPSULE | Freq: Every day | ORAL | 3 refills | Status: DC

## 2022-01-29 ENCOUNTER — Ambulatory Visit (INDEPENDENT_AMBULATORY_CARE_PROVIDER_SITE_OTHER): Payer: Medicare Other

## 2022-01-29 DIAGNOSIS — Z7901 Long term (current) use of anticoagulants: Secondary | ICD-10-CM

## 2022-01-29 DIAGNOSIS — I739 Peripheral vascular disease, unspecified: Secondary | ICD-10-CM | POA: Diagnosis not present

## 2022-01-29 DIAGNOSIS — Z4800 Encounter for change or removal of nonsurgical wound dressing: Secondary | ICD-10-CM | POA: Diagnosis not present

## 2022-01-29 DIAGNOSIS — L03115 Cellulitis of right lower limb: Secondary | ICD-10-CM | POA: Diagnosis not present

## 2022-01-29 DIAGNOSIS — M05771 Rheumatoid arthritis with rheumatoid factor of right ankle and foot without organ or systems involvement: Secondary | ICD-10-CM | POA: Diagnosis not present

## 2022-01-29 DIAGNOSIS — L97412 Non-pressure chronic ulcer of right heel and midfoot with fat layer exposed: Secondary | ICD-10-CM | POA: Diagnosis not present

## 2022-01-29 DIAGNOSIS — L97511 Non-pressure chronic ulcer of other part of right foot limited to breakdown of skin: Secondary | ICD-10-CM | POA: Diagnosis not present

## 2022-01-29 NOTE — Patient Instructions (Addendum)
Pre visit review using our clinic review tool, if applicable. No additional management support is needed unless otherwise documented below in the visit note.  Continue 1 tablet daily except take 1/2 tablet on Mon, Wed, and Fri. Recheck in 3 weeks on 7/11.

## 2022-01-29 NOTE — Progress Notes (Signed)
Received INR result of 2.4 for 01/26/22, Saturday, from [facility]. Pt tests at home with daughter's help, and results go to [facility]. Nurse out of office on 6/19 so results not received until today.  Continue 1 tablet daily except take 1/2 tablet on Mon, Wed, and Fri. Recheck in 3 weeks on 7/11. Advised continue normal dosing and if any changes to contact office. Pt verbalized understanding.

## 2022-01-30 ENCOUNTER — Telehealth: Payer: Self-pay | Admitting: Family Medicine

## 2022-01-30 NOTE — Telephone Encounter (Signed)
[facility] now has prednisoLONE 5 MG TABS tablet and the patient would like to switch back to that as opposed to the prednisone

## 2022-01-31 DIAGNOSIS — Z4800 Encounter for change or removal of nonsurgical wound dressing: Secondary | ICD-10-CM | POA: Diagnosis not present

## 2022-01-31 DIAGNOSIS — L03115 Cellulitis of right lower limb: Secondary | ICD-10-CM | POA: Diagnosis not present

## 2022-01-31 DIAGNOSIS — I739 Peripheral vascular disease, unspecified: Secondary | ICD-10-CM | POA: Diagnosis not present

## 2022-01-31 DIAGNOSIS — L97511 Non-pressure chronic ulcer of other part of right foot limited to breakdown of skin: Secondary | ICD-10-CM | POA: Diagnosis not present

## 2022-01-31 DIAGNOSIS — M05771 Rheumatoid arthritis with rheumatoid factor of right ankle and foot without organ or systems involvement: Secondary | ICD-10-CM | POA: Diagnosis not present

## 2022-01-31 DIAGNOSIS — L97412 Non-pressure chronic ulcer of right heel and midfoot with fat layer exposed: Secondary | ICD-10-CM | POA: Diagnosis not present

## 2022-02-04 DIAGNOSIS — M05771 Rheumatoid arthritis with rheumatoid factor of right ankle and foot without organ or systems involvement: Secondary | ICD-10-CM | POA: Diagnosis not present

## 2022-02-04 DIAGNOSIS — I739 Peripheral vascular disease, unspecified: Secondary | ICD-10-CM | POA: Diagnosis not present

## 2022-02-04 DIAGNOSIS — Z4800 Encounter for change or removal of nonsurgical wound dressing: Secondary | ICD-10-CM | POA: Diagnosis not present

## 2022-02-04 DIAGNOSIS — L97412 Non-pressure chronic ulcer of right heel and midfoot with fat layer exposed: Secondary | ICD-10-CM | POA: Diagnosis not present

## 2022-02-04 DIAGNOSIS — L97511 Non-pressure chronic ulcer of other part of right foot limited to breakdown of skin: Secondary | ICD-10-CM | POA: Diagnosis not present

## 2022-02-04 DIAGNOSIS — L03115 Cellulitis of right lower limb: Secondary | ICD-10-CM | POA: Diagnosis not present

## 2022-02-04 MED ORDER — PREDNISOLONE 5 MG PO TABS: 5.0000 mg | ORAL_TABLET | Freq: Every day | ORAL | 0 refills | Status: DC

## 2022-02-04 NOTE — Telephone Encounter (Signed)
Okay to go back to prednisone 5 mg.

## 2022-02-06 ENCOUNTER — Ambulatory Visit (INDEPENDENT_AMBULATORY_CARE_PROVIDER_SITE_OTHER): Payer: Medicare Other | Admitting: Podiatry

## 2022-02-06 ENCOUNTER — Ambulatory Visit: Payer: Medicare Other | Admitting: Podiatry

## 2022-02-06 ENCOUNTER — Encounter: Payer: Self-pay | Admitting: Podiatry

## 2022-02-06 DIAGNOSIS — M05772 Rheumatoid arthritis with rheumatoid factor of left ankle and foot without organ or systems involvement: Secondary | ICD-10-CM

## 2022-02-06 DIAGNOSIS — L97511 Non-pressure chronic ulcer of other part of right foot limited to breakdown of skin: Secondary | ICD-10-CM | POA: Diagnosis not present

## 2022-02-06 DIAGNOSIS — M05771 Rheumatoid arthritis with rheumatoid factor of right ankle and foot without organ or systems involvement: Secondary | ICD-10-CM

## 2022-02-06 DIAGNOSIS — I739 Peripheral vascular disease, unspecified: Secondary | ICD-10-CM

## 2022-02-06 DIAGNOSIS — L97412 Non-pressure chronic ulcer of right heel and midfoot with fat layer exposed: Secondary | ICD-10-CM | POA: Diagnosis not present

## 2022-02-06 NOTE — Progress Notes (Signed)
  Subjective:  Patient ID: Stacey [name], female    DOB: Apr 10, 1933,   MRN: [id number]  Chief Complaint  Patient presents with   wounc check    Wound check of right heel and 2nd toe. Pt states she is doing well.      88 y.o. female presents for follow-up of right heel wound and second digit area.  Right heel is doing well. Right heel is improving but is still draining.  Second toe is started to be more painful. Has been appying iodosorb. . Denies any other pedal complaints. Denies n/v/f/c.   Past Medical History:  Diagnosis Date   Atrial fibrillation (HCC)    Hernia of abdominal cavity    Kyphosis     Objective:  Physical Exam: Vascular: DP/PT pulses 2/4 bilateral. CFT <3 seconds. Normal hair growth on digits. No edema.  Skin. No lacerations or abrasions bilateral feet.Right heel ulcer about nearly healed.   .Mild erythema surrounding, improved.  Right second digit . Upon debridement 0.2 cm x 0.1 cm x 0.1 cm wound noted with granular base. No purulence noted.  Musculoskeletal: MMT 5/5 bilateral lower extremities in DF, PF, Inversion and Eversion. Deceased ROM in DF of ankle joint. Tender to wound area.  Neurological: Sensation intact to light touch.   Assessment:   1. Ulcer of right heel, with fat layer exposed (HCC)   2. Skin ulcer of second toe of right foot, limited to breakdown of skin (HCC)   3. PAD (peripheral artery disease) (HCC)   4. Rheumatoid arthritis involving both feet with positive rheumatoid factor (HCC)           Plan:  Patient was evaluated and treated and all questions answered. Ulcer right second digit  limited to breakdown of skin, doing well. New ulcer to right heel healing well . Cellulitis  -Dressed with iodosorb DSD.  -Debridement as below.  -Off-loading with sandals. Discussed using heel pads at night to protect the heels when in bed.  -No abx necessary today.  -Discussed again heel padding and dicsussed getting them to use at night.   X-[facility] reviewed from [facility].  -Continue dressing changes and bandaids to protect prominent areas of toes.    Procedure: Excisional Debridement of Wound Rationale: Removal of non-viable soft tissue from the wound to promote healing.  Anesthesia: none Pre-Debridement Wound Measurements: Overlying callus toe Post-Debridement Wound Measurements: 0.2 cm x 0.1 cm x 0.1cm  Superficial abrasion to right heel. Nearly healed  Type of Debridement: Sharp Excisional Tissue Removed: Non-viable soft tissue Depth of Debridement: subcutaneous tissue. Technique: Sharp excisional debridement to bleeding, viable wound base.  Dressing: Dry, sterile, compression dressing. Disposition: Patient tolerated procedure well. Patient to return in 2 week for follow-up.  Return in about 4 weeks (around 03/06/2022) for wound check.  .   Return in about 4 weeks (around 03/06/2022) for wound check.   Louann Sjogren, DPM   .

## 2022-02-07 DIAGNOSIS — L03115 Cellulitis of right lower limb: Secondary | ICD-10-CM | POA: Diagnosis not present

## 2022-02-07 DIAGNOSIS — Z4800 Encounter for change or removal of nonsurgical wound dressing: Secondary | ICD-10-CM | POA: Diagnosis not present

## 2022-02-07 DIAGNOSIS — I739 Peripheral vascular disease, unspecified: Secondary | ICD-10-CM | POA: Diagnosis not present

## 2022-02-07 DIAGNOSIS — L97511 Non-pressure chronic ulcer of other part of right foot limited to breakdown of skin: Secondary | ICD-10-CM | POA: Diagnosis not present

## 2022-02-07 DIAGNOSIS — M05771 Rheumatoid arthritis with rheumatoid factor of right ankle and foot without organ or systems involvement: Secondary | ICD-10-CM | POA: Diagnosis not present

## 2022-02-07 DIAGNOSIS — L97412 Non-pressure chronic ulcer of right heel and midfoot with fat layer exposed: Secondary | ICD-10-CM | POA: Diagnosis not present

## 2022-02-11 DIAGNOSIS — L97511 Non-pressure chronic ulcer of other part of right foot limited to breakdown of skin: Secondary | ICD-10-CM | POA: Diagnosis not present

## 2022-02-11 DIAGNOSIS — I739 Peripheral vascular disease, unspecified: Secondary | ICD-10-CM | POA: Diagnosis not present

## 2022-02-11 DIAGNOSIS — L03115 Cellulitis of right lower limb: Secondary | ICD-10-CM | POA: Diagnosis not present

## 2022-02-11 DIAGNOSIS — M05771 Rheumatoid arthritis with rheumatoid factor of right ankle and foot without organ or systems involvement: Secondary | ICD-10-CM | POA: Diagnosis not present

## 2022-02-11 DIAGNOSIS — Z4800 Encounter for change or removal of nonsurgical wound dressing: Secondary | ICD-10-CM | POA: Diagnosis not present

## 2022-02-11 DIAGNOSIS — L97412 Non-pressure chronic ulcer of right heel and midfoot with fat layer exposed: Secondary | ICD-10-CM | POA: Diagnosis not present

## 2022-02-15 DIAGNOSIS — L97511 Non-pressure chronic ulcer of other part of right foot limited to breakdown of skin: Secondary | ICD-10-CM | POA: Diagnosis not present

## 2022-02-15 DIAGNOSIS — I739 Peripheral vascular disease, unspecified: Secondary | ICD-10-CM | POA: Diagnosis not present

## 2022-02-15 DIAGNOSIS — L97412 Non-pressure chronic ulcer of right heel and midfoot with fat layer exposed: Secondary | ICD-10-CM | POA: Diagnosis not present

## 2022-02-15 DIAGNOSIS — Z4800 Encounter for change or removal of nonsurgical wound dressing: Secondary | ICD-10-CM | POA: Diagnosis not present

## 2022-02-15 DIAGNOSIS — L03115 Cellulitis of right lower limb: Secondary | ICD-10-CM | POA: Diagnosis not present

## 2022-02-15 DIAGNOSIS — M05771 Rheumatoid arthritis with rheumatoid factor of right ankle and foot without organ or systems involvement: Secondary | ICD-10-CM | POA: Diagnosis not present

## 2022-02-18 DIAGNOSIS — Z4800 Encounter for change or removal of nonsurgical wound dressing: Secondary | ICD-10-CM | POA: Diagnosis not present

## 2022-02-18 DIAGNOSIS — I739 Peripheral vascular disease, unspecified: Secondary | ICD-10-CM | POA: Diagnosis not present

## 2022-02-18 DIAGNOSIS — L97412 Non-pressure chronic ulcer of right heel and midfoot with fat layer exposed: Secondary | ICD-10-CM | POA: Diagnosis not present

## 2022-02-18 DIAGNOSIS — L03115 Cellulitis of right lower limb: Secondary | ICD-10-CM | POA: Diagnosis not present

## 2022-02-18 DIAGNOSIS — L97511 Non-pressure chronic ulcer of other part of right foot limited to breakdown of skin: Secondary | ICD-10-CM | POA: Diagnosis not present

## 2022-02-18 DIAGNOSIS — M05771 Rheumatoid arthritis with rheumatoid factor of right ankle and foot without organ or systems involvement: Secondary | ICD-10-CM | POA: Diagnosis not present

## 2022-02-21 DIAGNOSIS — L03115 Cellulitis of right lower limb: Secondary | ICD-10-CM | POA: Diagnosis not present

## 2022-02-21 DIAGNOSIS — L97412 Non-pressure chronic ulcer of right heel and midfoot with fat layer exposed: Secondary | ICD-10-CM | POA: Diagnosis not present

## 2022-02-21 DIAGNOSIS — L97511 Non-pressure chronic ulcer of other part of right foot limited to breakdown of skin: Secondary | ICD-10-CM | POA: Diagnosis not present

## 2022-02-21 DIAGNOSIS — Z4800 Encounter for change or removal of nonsurgical wound dressing: Secondary | ICD-10-CM | POA: Diagnosis not present

## 2022-02-21 DIAGNOSIS — M05771 Rheumatoid arthritis with rheumatoid factor of right ankle and foot without organ or systems involvement: Secondary | ICD-10-CM | POA: Diagnosis not present

## 2022-02-21 DIAGNOSIS — I739 Peripheral vascular disease, unspecified: Secondary | ICD-10-CM | POA: Diagnosis not present

## 2022-02-25 ENCOUNTER — Ambulatory Visit (INDEPENDENT_AMBULATORY_CARE_PROVIDER_SITE_OTHER): Payer: Medicare Other

## 2022-02-25 DIAGNOSIS — Z7901 Long term (current) use of anticoagulants: Secondary | ICD-10-CM

## 2022-02-25 LAB — POCT INR: INR: 2.3 (ref 2.0–3.0)

## 2022-02-25 NOTE — Patient Instructions (Addendum)
Pre visit review using our clinic review tool, if applicable. No additional management support is needed unless otherwise documented below in the visit note.  Continue 1 tablet daily except take 1/2 tablet on Mon, Wed, and Fri. Recheck in 2 weeks on 7/31.

## 2022-02-25 NOTE — Progress Notes (Addendum)
Received INR result of 2.3 from [facility]. Pt tests at home with daughter's help, and results go to [facility].   Continue 1 tablet daily except take 1/2 tablet on Mon, Wed, and Fri. Recheck in 2 weeks on 7/31. Advised continue normal dosing and if any changes to contact office. Pt verbalized understanding.

## 2022-02-26 DIAGNOSIS — L97511 Non-pressure chronic ulcer of other part of right foot limited to breakdown of skin: Secondary | ICD-10-CM | POA: Diagnosis not present

## 2022-02-26 DIAGNOSIS — Z9181 History of falling: Secondary | ICD-10-CM | POA: Diagnosis not present

## 2022-02-26 DIAGNOSIS — L97412 Non-pressure chronic ulcer of right heel and midfoot with fat layer exposed: Secondary | ICD-10-CM | POA: Diagnosis not present

## 2022-02-26 DIAGNOSIS — Z7952 Long term (current) use of systemic steroids: Secondary | ICD-10-CM | POA: Diagnosis not present

## 2022-02-26 DIAGNOSIS — Z4800 Encounter for change or removal of nonsurgical wound dressing: Secondary | ICD-10-CM | POA: Diagnosis not present

## 2022-02-26 DIAGNOSIS — M05771 Rheumatoid arthritis with rheumatoid factor of right ankle and foot without organ or systems involvement: Secondary | ICD-10-CM | POA: Diagnosis not present

## 2022-02-26 DIAGNOSIS — L03115 Cellulitis of right lower limb: Secondary | ICD-10-CM | POA: Diagnosis not present

## 2022-02-26 DIAGNOSIS — I739 Peripheral vascular disease, unspecified: Secondary | ICD-10-CM | POA: Diagnosis not present

## 2022-02-26 DIAGNOSIS — Z7901 Long term (current) use of anticoagulants: Secondary | ICD-10-CM | POA: Diagnosis not present

## 2022-02-26 DIAGNOSIS — M05772 Rheumatoid arthritis with rheumatoid factor of left ankle and foot without organ or systems involvement: Secondary | ICD-10-CM | POA: Diagnosis not present

## 2022-02-26 DIAGNOSIS — I4891 Unspecified atrial fibrillation: Secondary | ICD-10-CM | POA: Diagnosis not present

## 2022-03-01 ENCOUNTER — Telehealth: Payer: Self-pay | Admitting: Family Medicine

## 2022-03-01 DIAGNOSIS — L03115 Cellulitis of right lower limb: Secondary | ICD-10-CM | POA: Diagnosis not present

## 2022-03-01 DIAGNOSIS — Z4800 Encounter for change or removal of nonsurgical wound dressing: Secondary | ICD-10-CM | POA: Diagnosis not present

## 2022-03-01 DIAGNOSIS — M05771 Rheumatoid arthritis with rheumatoid factor of right ankle and foot without organ or systems involvement: Secondary | ICD-10-CM | POA: Diagnosis not present

## 2022-03-01 DIAGNOSIS — I739 Peripheral vascular disease, unspecified: Secondary | ICD-10-CM | POA: Diagnosis not present

## 2022-03-01 DIAGNOSIS — L97412 Non-pressure chronic ulcer of right heel and midfoot with fat layer exposed: Secondary | ICD-10-CM | POA: Diagnosis not present

## 2022-03-01 DIAGNOSIS — L97511 Non-pressure chronic ulcer of other part of right foot limited to breakdown of skin: Secondary | ICD-10-CM | POA: Diagnosis not present

## 2022-03-01 NOTE — Telephone Encounter (Signed)
Pt calling in to check on progress of this prescription request.

## 2022-03-01 NOTE — Telephone Encounter (Signed)
Pt called to report she has a UTI and wanted to know if MD could send her something to the pharmacy.   Pt was offered an OV, but said she did not have transportation and added that MD would not have a problem sending this in for her.    Pt was informed that she has not had an OV since 07/11/21, and may have to come in.  Pt asked me to ask MD if she could have the Rx, and will come in only if she really has to.  Please advise.  [id number] [place], [place] - [address] Phone:  [phone]  Fax:  [phone]

## 2022-03-04 DIAGNOSIS — M05771 Rheumatoid arthritis with rheumatoid factor of right ankle and foot without organ or systems involvement: Secondary | ICD-10-CM | POA: Diagnosis not present

## 2022-03-04 DIAGNOSIS — L03115 Cellulitis of right lower limb: Secondary | ICD-10-CM | POA: Diagnosis not present

## 2022-03-04 DIAGNOSIS — L97511 Non-pressure chronic ulcer of other part of right foot limited to breakdown of skin: Secondary | ICD-10-CM | POA: Diagnosis not present

## 2022-03-04 DIAGNOSIS — Z4800 Encounter for change or removal of nonsurgical wound dressing: Secondary | ICD-10-CM | POA: Diagnosis not present

## 2022-03-04 DIAGNOSIS — I739 Peripheral vascular disease, unspecified: Secondary | ICD-10-CM | POA: Diagnosis not present

## 2022-03-04 DIAGNOSIS — L97412 Non-pressure chronic ulcer of right heel and midfoot with fat layer exposed: Secondary | ICD-10-CM | POA: Diagnosis not present

## 2022-03-05 ENCOUNTER — Ambulatory Visit: Payer: Medicare Other | Admitting: Podiatry

## 2022-03-07 DIAGNOSIS — L03115 Cellulitis of right lower limb: Secondary | ICD-10-CM | POA: Diagnosis not present

## 2022-03-07 DIAGNOSIS — L97412 Non-pressure chronic ulcer of right heel and midfoot with fat layer exposed: Secondary | ICD-10-CM | POA: Diagnosis not present

## 2022-03-07 DIAGNOSIS — M05771 Rheumatoid arthritis with rheumatoid factor of right ankle and foot without organ or systems involvement: Secondary | ICD-10-CM | POA: Diagnosis not present

## 2022-03-07 DIAGNOSIS — L97511 Non-pressure chronic ulcer of other part of right foot limited to breakdown of skin: Secondary | ICD-10-CM | POA: Diagnosis not present

## 2022-03-07 DIAGNOSIS — I739 Peripheral vascular disease, unspecified: Secondary | ICD-10-CM | POA: Diagnosis not present

## 2022-03-07 DIAGNOSIS — Z4800 Encounter for change or removal of nonsurgical wound dressing: Secondary | ICD-10-CM | POA: Diagnosis not present

## 2022-03-07 NOTE — Telephone Encounter (Signed)
UA needed.  If can be done at patient's facility that will work.  If not a sample can be brought to clinic after obtaining a sterile cup.

## 2022-03-08 NOTE — Telephone Encounter (Signed)
Spoke to patient regards to getting UA. Patient updates she had taken AZO for the symptoms. And that after 6 days, her sx had went away. Offer UA done. Patient declined and said she will give a call if sx comes back.

## 2022-03-11 DIAGNOSIS — L97412 Non-pressure chronic ulcer of right heel and midfoot with fat layer exposed: Secondary | ICD-10-CM | POA: Diagnosis not present

## 2022-03-11 DIAGNOSIS — L03115 Cellulitis of right lower limb: Secondary | ICD-10-CM | POA: Diagnosis not present

## 2022-03-11 DIAGNOSIS — L97511 Non-pressure chronic ulcer of other part of right foot limited to breakdown of skin: Secondary | ICD-10-CM | POA: Diagnosis not present

## 2022-03-11 DIAGNOSIS — M05771 Rheumatoid arthritis with rheumatoid factor of right ankle and foot without organ or systems involvement: Secondary | ICD-10-CM | POA: Diagnosis not present

## 2022-03-11 DIAGNOSIS — I739 Peripheral vascular disease, unspecified: Secondary | ICD-10-CM | POA: Diagnosis not present

## 2022-03-11 DIAGNOSIS — Z4800 Encounter for change or removal of nonsurgical wound dressing: Secondary | ICD-10-CM | POA: Diagnosis not present

## 2022-03-13 ENCOUNTER — Ambulatory Visit (INDEPENDENT_AMBULATORY_CARE_PROVIDER_SITE_OTHER): Payer: Medicare Other | Admitting: Podiatry

## 2022-03-13 ENCOUNTER — Encounter: Payer: Self-pay | Admitting: Podiatry

## 2022-03-13 DIAGNOSIS — L97412 Non-pressure chronic ulcer of right heel and midfoot with fat layer exposed: Secondary | ICD-10-CM | POA: Diagnosis not present

## 2022-03-13 DIAGNOSIS — M05771 Rheumatoid arthritis with rheumatoid factor of right ankle and foot without organ or systems involvement: Secondary | ICD-10-CM

## 2022-03-13 DIAGNOSIS — I739 Peripheral vascular disease, unspecified: Secondary | ICD-10-CM

## 2022-03-13 DIAGNOSIS — M05772 Rheumatoid arthritis with rheumatoid factor of left ankle and foot without organ or systems involvement: Secondary | ICD-10-CM

## 2022-03-13 NOTE — Progress Notes (Signed)
  Subjective:  Patient ID: Stacey [name], female    DOB: 05-12-1933,   MRN: [id number]  Chief Complaint  Patient presents with   Foot Ulcer     Right foot ulcer - patient states last week it has gotten worst , pain varies. Patient still using cream. HH is still coming out , no swelling , patient that ulcer at heel is opening back up      88 y.o. female presents for follow-up of right heel wound and second digit area.  Right heel has opened up more since last visit and feet have been painful Second toe has been doing very well. . Denies any other pedal complaints. Denies n/v/f/c.   Past Medical History:  Diagnosis Date   Atrial fibrillation (HCC)    Hernia of abdominal cavity    Kyphosis     Objective:  Physical Exam: Vascular: DP/PT pulses 2/4 bilateral. CFT <3 seconds. Normal hair growth on digits. No edema.  Skin. No lacerations or abrasions bilateral feet.Right heel ulcer about about 0.2 cm x 0.4 cm x 0.2 cm No erythema edema or purulence noted.   .Mild erythema surrounding, improved.  Right second digit -healed. No purulence noted.  Musculoskeletal: MMT 5/5 bilateral lower extremities in DF, PF, Inversion and Eversion. Deceased ROM in DF of ankle joint. Tender to wound area.  Neurological: Sensation intact to light touch.   Assessment:   1. Ulcer of right heel, with fat layer exposed (HCC)   2. PAD (peripheral artery disease) (HCC)   3. Rheumatoid arthritis involving both feet with positive rheumatoid factor (HCC)            Plan:  Patient was evaluated and treated and all questions answered. Ulcer right second digit  limited to breakdown of skin, doing well. New ulcer to right heel healing well . Cellulitis  -Dressed with iodosorb DSD.  -Debridement as below.  -Off-loading with sandals. Discussed using heel pads at night to protect the heels when in bed.  -No abx necessary today.  -Continue heel padding.  [facility] reviewed from [facility].  -Continue dressing  changes and bandaids to protect prominent areas of toes.  -Will refer to wound care to consider HBO therapy possibility.    Procedure: Excisional Debridement of Wound Rationale: Removal of non-viable soft tissue from the wound to promote healing.  Anesthesia: none Pre-Debridement Wound Measurements: Overlying callus right heel  Post-Debridement Wound Measurements: 0.2 cm x 0.4 cm x 0.2cm  Superficial abrasion to right heel. Nearly healed  Type of Debridement: Sharp Excisional Tissue Removed: Non-viable soft tissue Depth of Debridement: subcutaneous tissue. Technique: Sharp excisional debridement to bleeding, viable wound base.  Dressing: Dry, sterile, compression dressing. Disposition: Patient tolerated procedure well. Patient to return in 4 week for follow-up.  No follow-ups on file.  .   No follow-ups on file.   Louann Sjogren, DPM   .

## 2022-03-15 DIAGNOSIS — L03115 Cellulitis of right lower limb: Secondary | ICD-10-CM | POA: Diagnosis not present

## 2022-03-15 DIAGNOSIS — L97412 Non-pressure chronic ulcer of right heel and midfoot with fat layer exposed: Secondary | ICD-10-CM | POA: Diagnosis not present

## 2022-03-15 DIAGNOSIS — L97511 Non-pressure chronic ulcer of other part of right foot limited to breakdown of skin: Secondary | ICD-10-CM | POA: Diagnosis not present

## 2022-03-15 DIAGNOSIS — Z4800 Encounter for change or removal of nonsurgical wound dressing: Secondary | ICD-10-CM | POA: Diagnosis not present

## 2022-03-15 DIAGNOSIS — M05771 Rheumatoid arthritis with rheumatoid factor of right ankle and foot without organ or systems involvement: Secondary | ICD-10-CM | POA: Diagnosis not present

## 2022-03-15 DIAGNOSIS — I739 Peripheral vascular disease, unspecified: Secondary | ICD-10-CM | POA: Diagnosis not present

## 2022-03-18 ENCOUNTER — Ambulatory Visit (INDEPENDENT_AMBULATORY_CARE_PROVIDER_SITE_OTHER): Payer: Medicare Other

## 2022-03-18 DIAGNOSIS — L97412 Non-pressure chronic ulcer of right heel and midfoot with fat layer exposed: Secondary | ICD-10-CM | POA: Diagnosis not present

## 2022-03-18 DIAGNOSIS — L03115 Cellulitis of right lower limb: Secondary | ICD-10-CM | POA: Diagnosis not present

## 2022-03-18 DIAGNOSIS — Z7952 Long term (current) use of systemic steroids: Secondary | ICD-10-CM

## 2022-03-18 DIAGNOSIS — Z4800 Encounter for change or removal of nonsurgical wound dressing: Secondary | ICD-10-CM | POA: Diagnosis not present

## 2022-03-18 DIAGNOSIS — M05771 Rheumatoid arthritis with rheumatoid factor of right ankle and foot without organ or systems involvement: Secondary | ICD-10-CM | POA: Diagnosis not present

## 2022-03-18 DIAGNOSIS — L97511 Non-pressure chronic ulcer of other part of right foot limited to breakdown of skin: Secondary | ICD-10-CM | POA: Diagnosis not present

## 2022-03-18 DIAGNOSIS — I739 Peripheral vascular disease, unspecified: Secondary | ICD-10-CM | POA: Diagnosis not present

## 2022-03-18 LAB — POCT INR: INR: 2.4 (ref 2.0–3.0)

## 2022-03-18 NOTE — Patient Instructions (Addendum)
Pre visit review using our clinic review tool, if applicable. No additional management support is needed unless otherwise documented below in the visit note.  Continue 1 tablet daily except take 1/2 tablet on Mon, Wed, and Fri. Recheck in 2 weeks on 8/21.

## 2022-03-18 NOTE — Progress Notes (Addendum)
Received INR result of 2.4 from [facility]. Pt tests at home with daughter's help, and results go to [facility].  Continue 1 tablet daily except take 1/2 tablet on Mon, Wed, and Fri. Recheck in 2 weeks on 8/21. Advised continue normal dosing and if any changes to contact office. Pt verbalized understanding.  Also LVM for pt's daughter to continue normal dosing.

## 2022-03-19 ENCOUNTER — Telehealth: Payer: Self-pay | Admitting: Family Medicine

## 2022-03-19 DIAGNOSIS — M159 Polyosteoarthritis, unspecified: Secondary | ICD-10-CM

## 2022-03-19 NOTE — Telephone Encounter (Signed)
Ok

## 2022-03-19 NOTE — Telephone Encounter (Signed)
Patient called because she would like a Halo bed rail and needs Dr.Banks to put in an order for it so medicare will pay. The current bed rail she has is not approved by her assisted living, but they do approve the halo, which she needs for when the state inspectors come to inspect her assisted living facility. Patient would like a call with updates        Please

## 2022-03-20 NOTE — Telephone Encounter (Signed)
Order for DME placed.

## 2022-03-21 DIAGNOSIS — M05771 Rheumatoid arthritis with rheumatoid factor of right ankle and foot without organ or systems involvement: Secondary | ICD-10-CM | POA: Diagnosis not present

## 2022-03-21 DIAGNOSIS — L97412 Non-pressure chronic ulcer of right heel and midfoot with fat layer exposed: Secondary | ICD-10-CM | POA: Diagnosis not present

## 2022-03-21 DIAGNOSIS — I739 Peripheral vascular disease, unspecified: Secondary | ICD-10-CM | POA: Diagnosis not present

## 2022-03-21 DIAGNOSIS — L03115 Cellulitis of right lower limb: Secondary | ICD-10-CM | POA: Diagnosis not present

## 2022-03-21 DIAGNOSIS — Z4800 Encounter for change or removal of nonsurgical wound dressing: Secondary | ICD-10-CM | POA: Diagnosis not present

## 2022-03-21 DIAGNOSIS — L97511 Non-pressure chronic ulcer of other part of right foot limited to breakdown of skin: Secondary | ICD-10-CM | POA: Diagnosis not present

## 2022-03-25 ENCOUNTER — Telehealth: Payer: Self-pay | Admitting: Family Medicine

## 2022-03-25 NOTE — Telephone Encounter (Signed)
Pt is calling and would like a refill on traMADol (ULTRAM) 50 MG tablet  [facility] - [place], [place] - [address] Phone:  [phone]  Fax:  [phone]

## 2022-03-26 DIAGNOSIS — L03115 Cellulitis of right lower limb: Secondary | ICD-10-CM | POA: Diagnosis not present

## 2022-03-26 DIAGNOSIS — L97511 Non-pressure chronic ulcer of other part of right foot limited to breakdown of skin: Secondary | ICD-10-CM | POA: Diagnosis not present

## 2022-03-26 DIAGNOSIS — Z4800 Encounter for change or removal of nonsurgical wound dressing: Secondary | ICD-10-CM | POA: Diagnosis not present

## 2022-03-26 DIAGNOSIS — I739 Peripheral vascular disease, unspecified: Secondary | ICD-10-CM | POA: Diagnosis not present

## 2022-03-26 DIAGNOSIS — L97412 Non-pressure chronic ulcer of right heel and midfoot with fat layer exposed: Secondary | ICD-10-CM | POA: Diagnosis not present

## 2022-03-26 DIAGNOSIS — M05771 Rheumatoid arthritis with rheumatoid factor of right ankle and foot without organ or systems involvement: Secondary | ICD-10-CM | POA: Diagnosis not present

## 2022-03-28 DIAGNOSIS — L03115 Cellulitis of right lower limb: Secondary | ICD-10-CM | POA: Diagnosis not present

## 2022-03-28 DIAGNOSIS — L97412 Non-pressure chronic ulcer of right heel and midfoot with fat layer exposed: Secondary | ICD-10-CM | POA: Diagnosis not present

## 2022-03-28 DIAGNOSIS — M05771 Rheumatoid arthritis with rheumatoid factor of right ankle and foot without organ or systems involvement: Secondary | ICD-10-CM | POA: Diagnosis not present

## 2022-03-28 DIAGNOSIS — Z7901 Long term (current) use of anticoagulants: Secondary | ICD-10-CM | POA: Diagnosis not present

## 2022-03-28 DIAGNOSIS — M05772 Rheumatoid arthritis with rheumatoid factor of left ankle and foot without organ or systems involvement: Secondary | ICD-10-CM | POA: Diagnosis not present

## 2022-03-28 DIAGNOSIS — Z7952 Long term (current) use of systemic steroids: Secondary | ICD-10-CM | POA: Diagnosis not present

## 2022-03-28 DIAGNOSIS — I4891 Unspecified atrial fibrillation: Secondary | ICD-10-CM | POA: Diagnosis not present

## 2022-03-28 DIAGNOSIS — L97511 Non-pressure chronic ulcer of other part of right foot limited to breakdown of skin: Secondary | ICD-10-CM | POA: Diagnosis not present

## 2022-03-28 DIAGNOSIS — Z4800 Encounter for change or removal of nonsurgical wound dressing: Secondary | ICD-10-CM | POA: Diagnosis not present

## 2022-03-28 DIAGNOSIS — I739 Peripheral vascular disease, unspecified: Secondary | ICD-10-CM | POA: Diagnosis not present

## 2022-03-28 DIAGNOSIS — Z9181 History of falling: Secondary | ICD-10-CM | POA: Diagnosis not present

## 2022-03-29 DIAGNOSIS — L97412 Non-pressure chronic ulcer of right heel and midfoot with fat layer exposed: Secondary | ICD-10-CM | POA: Diagnosis not present

## 2022-03-29 DIAGNOSIS — L03115 Cellulitis of right lower limb: Secondary | ICD-10-CM | POA: Diagnosis not present

## 2022-03-29 DIAGNOSIS — L97511 Non-pressure chronic ulcer of other part of right foot limited to breakdown of skin: Secondary | ICD-10-CM | POA: Diagnosis not present

## 2022-03-29 DIAGNOSIS — M05771 Rheumatoid arthritis with rheumatoid factor of right ankle and foot without organ or systems involvement: Secondary | ICD-10-CM | POA: Diagnosis not present

## 2022-03-29 DIAGNOSIS — I739 Peripheral vascular disease, unspecified: Secondary | ICD-10-CM | POA: Diagnosis not present

## 2022-03-29 DIAGNOSIS — Z4800 Encounter for change or removal of nonsurgical wound dressing: Secondary | ICD-10-CM | POA: Diagnosis not present

## 2022-03-31 ENCOUNTER — Other Ambulatory Visit: Payer: Self-pay | Admitting: Family Medicine

## 2022-03-31 DIAGNOSIS — E782 Mixed hyperlipidemia: Secondary | ICD-10-CM

## 2022-03-31 DIAGNOSIS — K219 Gastro-esophageal reflux disease without esophagitis: Secondary | ICD-10-CM

## 2022-04-01 ENCOUNTER — Telehealth: Payer: Self-pay | Admitting: Family Medicine

## 2022-04-01 DIAGNOSIS — Z4800 Encounter for change or removal of nonsurgical wound dressing: Secondary | ICD-10-CM | POA: Diagnosis not present

## 2022-04-01 DIAGNOSIS — I739 Peripheral vascular disease, unspecified: Secondary | ICD-10-CM | POA: Diagnosis not present

## 2022-04-01 DIAGNOSIS — I4821 Permanent atrial fibrillation: Secondary | ICD-10-CM | POA: Diagnosis not present

## 2022-04-01 DIAGNOSIS — L97412 Non-pressure chronic ulcer of right heel and midfoot with fat layer exposed: Secondary | ICD-10-CM | POA: Diagnosis not present

## 2022-04-01 DIAGNOSIS — M05771 Rheumatoid arthritis with rheumatoid factor of right ankle and foot without organ or systems involvement: Secondary | ICD-10-CM | POA: Diagnosis not present

## 2022-04-01 DIAGNOSIS — L03115 Cellulitis of right lower limb: Secondary | ICD-10-CM | POA: Diagnosis not present

## 2022-04-01 DIAGNOSIS — L97511 Non-pressure chronic ulcer of other part of right foot limited to breakdown of skin: Secondary | ICD-10-CM | POA: Diagnosis not present

## 2022-04-01 DIAGNOSIS — Z7901 Long term (current) use of anticoagulants: Secondary | ICD-10-CM | POA: Diagnosis not present

## 2022-04-01 LAB — POCT INR: INR: 2.1 (ref 2.0–3.0)

## 2022-04-01 NOTE — Telephone Encounter (Signed)
Pt is calling back about her refill on tramadol.

## 2022-04-01 NOTE — Telephone Encounter (Signed)
Pt requests refill traMADol (ULTRAM) 50 MG tablet  pt states she is out of this medication.  [id number] [place], [place] - [address] Phone:  [phone]  Fax:  [phone]

## 2022-04-01 NOTE — Telephone Encounter (Signed)
Pt calling in again to check on progress of this prescription.

## 2022-04-02 ENCOUNTER — Ambulatory Visit (INDEPENDENT_AMBULATORY_CARE_PROVIDER_SITE_OTHER): Payer: Medicare Other

## 2022-04-02 DIAGNOSIS — Z7901 Long term (current) use of anticoagulants: Secondary | ICD-10-CM

## 2022-04-02 NOTE — Progress Notes (Signed)
Received INR result of 2.1 from last night from [facility]. Pt tests at home with daughter's help, and results go to [facility].  Continue 1 tablet daily except take 1/2 tablet on Mon, Wed, and Fri. Recheck in 3 weeks on 9/12, due to coumadin nurse being out of office. Advised continue normal dosing and if any changes to contact office. Pt verbalized understanding. Also LVM for pt's daughter to continue normal dosing.

## 2022-04-02 NOTE — Patient Instructions (Addendum)
Pre visit review using our clinic review tool, if applicable. No additional management support is needed unless otherwise documented below in the visit note.  Continue 1 tablet daily except take 1/2 tablet on Mon, Wed, and Fri. Recheck in 3 weeks on 9/12.

## 2022-04-03 ENCOUNTER — Encounter (HOSPITAL_BASED_OUTPATIENT_CLINIC_OR_DEPARTMENT_OTHER): Payer: Medicare Other | Attending: General Surgery | Admitting: General Surgery

## 2022-04-03 ENCOUNTER — Other Ambulatory Visit: Payer: Self-pay | Admitting: Family Medicine

## 2022-04-03 DIAGNOSIS — L89892 Pressure ulcer of other site, stage 2: Secondary | ICD-10-CM | POA: Diagnosis not present

## 2022-04-03 DIAGNOSIS — I509 Heart failure, unspecified: Secondary | ICD-10-CM | POA: Diagnosis not present

## 2022-04-03 DIAGNOSIS — S91301A Unspecified open wound, right foot, initial encounter: Secondary | ICD-10-CM | POA: Diagnosis not present

## 2022-04-03 DIAGNOSIS — Z7952 Long term (current) use of systemic steroids: Secondary | ICD-10-CM | POA: Diagnosis not present

## 2022-04-03 DIAGNOSIS — I739 Peripheral vascular disease, unspecified: Secondary | ICD-10-CM | POA: Insufficient documentation

## 2022-04-03 DIAGNOSIS — M069 Rheumatoid arthritis, unspecified: Secondary | ICD-10-CM | POA: Diagnosis not present

## 2022-04-03 DIAGNOSIS — L97511 Non-pressure chronic ulcer of other part of right foot limited to breakdown of skin: Secondary | ICD-10-CM | POA: Diagnosis not present

## 2022-04-03 DIAGNOSIS — L97412 Non-pressure chronic ulcer of right heel and midfoot with fat layer exposed: Secondary | ICD-10-CM | POA: Diagnosis not present

## 2022-04-03 DIAGNOSIS — G629 Polyneuropathy, unspecified: Secondary | ICD-10-CM | POA: Diagnosis not present

## 2022-04-03 MED ORDER — TRAMADOL HCL 50 MG PO TABS: ORAL_TABLET | ORAL | 5 refills | Status: DC

## 2022-04-03 MED ORDER — TRAMADOL HCL 50 MG PO TABS: 100.0000 mg | ORAL_TABLET | Freq: Two times a day (BID) | ORAL | 0 refills | Status: AC | PRN

## 2022-04-03 NOTE — Progress Notes (Addendum)
RUHI, BOLZ ([id number]) Visit Report for 04/03/2022 Chief Complaint Document Details Patient Name: Date of Service: Stacey [name]. 04/03/2022 1:30 PM Medical Record Number: [id number] Patient Account Number: [id number] Date of Birth/Sex: Treating RN: 11-01-1932 (88 y.o. Stacey [name] Primary Care Provider: Grier Mitts Other Clinician: Referring Provider: Treating Provider/Extender: Antionette Poles in Treatment: 0 Information Obtained from: Patient Chief Complaint Patient seen for complaints of Non-Healing Wounds. Electronic Signature(s) Signed: 04/03/2022 3:35:44 PM By: Fredirick Maudlin MD FACS Entered By: Fredirick Maudlin on 04/03/2022 15:35:44 -------------------------------------------------------------------------------- Debridement Details Patient Name: Date of Service: Stacey Sellar M. 04/03/2022 1:30 PM Medical Record Number: [id number] Patient Account Number: [id number] Date of Birth/Sex: Treating RN: 1933/06/24 (88 y.o. Stacey [name], Stacey [name] Primary Care Provider: Grier Mitts Other Clinician: Referring Provider: Treating Provider/Extender: Antionette Poles in Treatment: 0 Debridement Performed for Assessment: Wound #2 Right,Medial Calcaneus Performed By: Physician Fredirick Maudlin, MD Debridement Type: Debridement Level of Consciousness (Pre-procedure): Awake and Alert Pre-procedure Verification/Time Out Yes - 15:02 Taken: Start Time: 15:03 Pain Control: Lidocaine 5% topical ointment T Area Debrided (L x W): otal 0.5 (cm) x 0.4 (cm) = 0.2 (cm) Tissue and other material debrided: Non-Viable, Eschar, Slough, Slough Level: Non-Viable Tissue Debridement Description: Selective/Open Wound Instrument: Curette Bleeding: Minimum Hemostasis Achieved: Pressure Response to Treatment: Procedure was tolerated well Level of Consciousness (Post- Awake and Alert procedure): Post Debridement Measurements of  Total Wound Length: (cm) 0.5 Width: (cm) 0.4 Depth: (cm) 0.3 Volume: (cm) 0.047 Character of Wound/Ulcer Post Debridement: Requires Further Debridement Post Procedure Diagnosis Same as Pre-procedure Electronic Signature(s) Signed: 04/03/2022 4:55:45 PM By: Fredirick Maudlin MD FACS Signed: 04/05/2022 7:35:53 AM By: Blanche East RN Entered By: Blanche East on 04/03/2022 15:04:33 -------------------------------------------------------------------------------- Debridement Details Patient Name: Date of Service: Stacey Sellar M. 04/03/2022 1:30 PM Medical Record Number: [id number] Patient Account Number: [id number] Date of Birth/Sex: Treating RN: 1933-06-23 (88 y.o. Stacey [name], [place] Primary Care Provider: Grier Mitts Other Clinician: Referring Provider: Treating Provider/Extender: Antionette Poles in Treatment: 0 Debridement Performed for Assessment: Wound #1 Dorsal T Second oe Performed By: Physician Fredirick Maudlin, MD Debridement Type: Debridement Level of Consciousness (Pre-procedure): Awake and Alert Pre-procedure Verification/Time Out Yes - 15:02 Taken: Start Time: 15:03 Pain Control: Lidocaine 5% topical ointment T Area Debrided (L x W): otal 0.3 (cm) x 0.2 (cm) = 0.06 (cm) Tissue and other material debrided: Non-Viable, Eschar, Slough, Slough Level: Non-Viable Tissue Debridement Description: Selective/Open Wound Instrument: Curette Bleeding: Minimum Hemostasis Achieved: Pressure Procedural Pain: 0 Post Procedural Pain: 0 Response to Treatment: Procedure was tolerated well Level of Consciousness (Post- Awake and Alert procedure): Post Debridement Measurements of Total Wound Length: (cm) 0.3 Stage: Category/Stage II Width: (cm) 0.2 Depth: (cm) 0.1 Volume: (cm) 0.005 Character of Wound/Ulcer Post Debridement: Improved Post Procedure Diagnosis Same as Pre-procedure Electronic Signature(s) Signed: 04/03/2022 4:55:45 PM By: Fredirick Maudlin MD FACS Signed: 04/05/2022 7:35:53 AM By: Blanche East RN Entered By: Blanche East on 04/03/2022 15:05:10 -------------------------------------------------------------------------------- HPI Details Patient Name: Date of Service: Stacey Sellar M. 04/03/2022 1:30 PM Medical Record Number: [id number] Patient Account Number: [id number] Date of Birth/Sex: Treating RN: [birth year] (88 y.o. Stacey [name] Primary Care Provider: Grier Mitts Other Clinician: Referring Provider: Treating Provider/Extender: Antionette Poles in Treatment: 0 History of Present Illness HPI Description: ADMISSION 04/03/2022 This is an 88 year old nondiabetic with congestive heart failure, peripheral vascular disease, rheumatoid arthritis on long-term steroids, and neuropathy who was referred  by podiatry for further evaluation and management of wounds on her right foot. She says that the ulcer on her right heel has been present for a year and the ulcer on her right second toe has been present for about a month. As I read the podiatry notes, it sounds as though they were referring her for hyperbaric oxygen therapy evaluation, but I do not see an indication for this. They have been applying Iodosorb and she does have home health coming twice a week to assist with her dressing changes. She also is capable of changing them on her own when she bathes. ABI in clinic today was noncompressible. She had formal ABIs done on July 20, 2021. +-------+---------------+-----------+------------+------------+ ABI/TBIT oday's ABI T oday's TBIPrevious ABIPrevious TBI +-------+---------------+-----------+------------+------------+ Right Noncompressible0.43    +-------+---------------+-----------+------------+------------+ Left Noncompressible1.11    +-------+---------------+-----------+------------+------------+ On the DIP joint of the right second toe, there is a very  superficial wound with a light layer of eschar present. Underneath, the opening is pinpoint. On the medial aspect of her right heel, there is a small ulcer with slough and eschar covering the surface. Underneath this, there is good granulation tissue. No erythema, induration, malodor, or purulent drainage. Electronic Signature(s) Signed: 04/03/2022 3:49:43 PM By: Duanne Guess MD FACS Previous Signature: 04/03/2022 3:39:49 PM Version By: Duanne Guess MD FACS Entered By: Duanne Guess on 04/03/2022 15:49:42 -------------------------------------------------------------------------------- Physical Exam Details Patient Name: Date of Service: Stacey Pearson M. 04/03/2022 1:30 PM Medical Record Number: [id number] Patient Account Number: [id number] Date of Birth/Sex: Treating RN: [birth year] (88 y.o. Fredderick Phenix Primary Care Provider: Abbe Amsterdam Other Clinician: Referring Provider: Treating Provider/Extender: Perley Jain in Treatment: 0 Constitutional . . . . No acute distress. Respiratory Normal work of breathing on room air.. Cardiovascular Nonpalpable. . Notes 04/03/2022: On the DIP joint of the right second toe, there is a very superficial wound with a light layer of eschar present. Underneath, the opening is pinpoint. On the medial aspect of her right heel, there is a small ulcer with slough and eschar covering the surface. Underneath this, there is good granulation tissue. No erythema, induration, malodor, or purulent drainage. Electronic Signature(s) Signed: 04/03/2022 3:50:23 PM By: Duanne Guess MD FACS Entered By: Duanne Guess on 04/03/2022 15:50:23 -------------------------------------------------------------------------------- Physician Orders Details Patient Name: Date of Service: Stacey Pearson M. 04/03/2022 1:30 PM Medical Record Number: [id number] Patient Account Number: [id number] Date of Birth/Sex: Treating  RN: December 05, 1932 (88 y.o. Stacey [name], Stacey [name] Primary Care Provider: Abbe Amsterdam Other Clinician: Referring Provider: Treating Provider/Extender: Perley Jain in Treatment: 0 Verbal / Phone Orders: No Diagnosis Coding ICD-10 Coding Code Description [phone] Non-pressure chronic ulcer of right heel and midfoot with fat layer exposed L97.511 Non-pressure chronic ulcer of other part of right foot limited to breakdown of skin I73.9 Peripheral vascular disease, unspecified I50.9 Heart failure, unspecified G62.9 Polyneuropathy, unspecified Z79.52 Long term (current) use of systemic steroids Follow-up Appointments ppointment in 1 week. - Dr. Lady Gary- rm 4 Return A Thursday 04/11/22 at 11:00 AM Anesthetic Wound #1 Right,Dorsal T Second oe (In clinic) Topical Lidocaine 5% applied to wound bed Wound #2 Right,Medial Calcaneus (In clinic) Topical Lidocaine 5% applied to wound bed Bathing/ Shower/ Hygiene May shower and wash wound with soap and water. Off-Loading Heel suspension boot to: Home Health Wound #1 Right,Dorsal T Second oe New wound care orders this week; continue Home Health for wound care. May utilize formulary equivalent dressing for wound treatment orders unless otherwise specified. Other  Home Health Orders/Instructions: - [facility] Wound #2 Right,Medial Calcaneus New wound care orders this week; continue Home Health for wound care. May utilize formulary equivalent dressing for wound treatment orders unless otherwise specified. Wound Treatment Wound #1 - T Second oe Wound Laterality: Dorsal, Right Cleanser: Soap and Water 3 x Per Week Discharge Instructions: May shower and wash wound with dial antibacterial soap and water prior to dressing change. Peri-Wound Care: Sween Lotion (Moisturizing lotion) 3 x Per Week Discharge Instructions: Apply moisturizing lotion as directed Prim Dressing: KerraCel Ag Gelling Fiber Dressing, 4x5 in (silver alginate) 3  x Per Week ary Discharge Instructions: Apply silver alginate to wound bed as instructed Secondary Dressing: ALLEVYN Heel 4 1/2in x 5 1/2in / 10.5cm x 13.5cm 3 x Per Week Discharge Instructions: Apply over primary dressing as directed. Secured With: [employer], 4.5x3.1 (in/yd) 3 x Per Week Discharge Instructions: Secure with Kerlix as directed. Compression Wrap: Kerlix Roll 4.5x3.1 (in/yd) 3 x Per Week Discharge Instructions: Apply Kerlix and Coban compression as directed. Wound #2 - Calcaneus Wound Laterality: Right, Medial Cleanser: Soap and Water 3 x Per Week Discharge Instructions: May shower and wash wound with dial antibacterial soap and water prior to dressing change. Peri-Wound Care: Sween Lotion (Moisturizing lotion) 3 x Per Week Discharge Instructions: Apply moisturizing lotion as directed Prim Dressing: KerraCel Ag Gelling Fiber Dressing, 4x5 in (silver alginate) 3 x Per Week ary Discharge Instructions: Apply silver alginate to wound bed as instructed Secondary Dressing: ALLEVYN Heel 4 1/2in x 5 1/2in / 10.5cm x 13.5cm 3 x Per Week Discharge Instructions: Apply over primary dressing as directed. Secured With: [employer], 4.5x3.1 (in/yd) 3 x Per Week Discharge Instructions: Secure with Kerlix as directed. Compression Wrap: Kerlix Roll 4.5x3.1 (in/yd) 3 x Per Week Discharge Instructions: Apply Kerlix and Coban compression as directed. Electronic Signature(s) Signed: 04/04/2022 7:33:54 AM By: Fredirick Maudlin MD FACS Signed: 04/05/2022 7:35:53 AM By: Blanche East RN Previous Signature: 04/03/2022 4:55:45 PM Version By: Fredirick Maudlin MD FACS Entered By: Blanche East on 04/03/2022 17:29:33 -------------------------------------------------------------------------------- Problem List Details Patient Name: Date of Service: Stacey Sellar M. 04/03/2022 1:30 PM Medical Record Number: [id number] Patient Account Number: [id number] Date of Birth/Sex: Treating  RN: 1933-02-22 (88 y.o. Stacey [name] Primary Care Provider: Grier Mitts Other Clinician: Referring Provider: Treating Provider/Extender: Antionette Poles in Treatment: 0 Active Problems ICD-10 Encounter Code Description Active Date MDM Diagnosis L97.412 Non-pressure chronic ulcer of right heel and midfoot with fat layer exposed 04/03/2022 No Yes L97.511 Non-pressure chronic ulcer of other part of right foot limited to breakdown of 04/03/2022 No Yes skin I73.9 Peripheral vascular disease, unspecified 04/03/2022 No Yes I50.9 Heart failure, unspecified 04/03/2022 No Yes G62.9 Polyneuropathy, unspecified 04/03/2022 No Yes Z79.52 Long term (current) use of systemic steroids 04/03/2022 No Yes Inactive Problems Resolved Problems Electronic Signature(s) Signed: 04/03/2022 3:35:20 PM By: Fredirick Maudlin MD FACS Previous Signature: 04/03/2022 2:16:27 PM Version By: Fredirick Maudlin MD FACS Previous Signature: 04/03/2022 2:08:36 PM Version By: Fredirick Maudlin MD FACS Entered By: Fredirick Maudlin on 04/03/2022 15:35:20 -------------------------------------------------------------------------------- Progress Note Details Patient Name: Date of Service: Stacey Sellar M. 04/03/2022 1:30 PM Medical Record Number: [id number] Patient Account Number: [id number] Date of Birth/Sex: Treating RN: 1933-03-05 (88 y.o. Stacey [name] Primary Care Provider: Grier Mitts Other Clinician: Referring Provider: Treating Provider/Extender: Antionette Poles in Treatment: 0 Subjective Chief Complaint Information obtained from Patient Patient seen for complaints of Non-Healing Wounds. History of Present Illness (  HPI) ADMISSION 04/03/2022 This is an 88 year old nondiabetic with congestive heart failure, peripheral vascular disease, rheumatoid arthritis on long-term steroids, and neuropathy who was referred by podiatry for further evaluation and  management of wounds on her right foot. She says that the ulcer on her right heel has been present for a year and the ulcer on her right second toe has been present for about a month. As I read the podiatry notes, it sounds as though they were referring her for hyperbaric oxygen therapy evaluation, but I do not see an indication for this. They have been applying Iodosorb and she does have home health coming twice a week to assist with her dressing changes. She also is capable of changing them on her own when she bathes. ABI in clinic today was noncompressible. She had formal ABIs done on July 20, 2021. +-------+---------------+-----------+------------+------------+ ABI/TBIT oday's ABI T oday's TBIPrevious ABIPrevious TBI +-------+---------------+-----------+------------+------------+ Right Noncompressible0.43    +-------+---------------+-----------+------------+------------+ Left Noncompressible1.11    +-------+---------------+-----------+------------+------------+ On the DIP joint of the right second toe, there is a very superficial wound with a light layer of eschar present. Underneath, the opening is pinpoint. On the medial aspect of her right heel, there is a small ulcer with slough and eschar covering the surface. Underneath this, there is good granulation tissue. No erythema, induration, malodor, or purulent drainage. Patient History Information obtained from Patient, Other: grandaughter. Allergies sulfa antibiotics (Severity: Severe, Reaction: anaphylaxis), codeine (Severity: Moderate, Reaction: nausea and vomiting), latex (Severity: Moderate), Shellfish Containing Products (Severity: Moderate), Neosporin (neo-bac-polym) (Severity: Moderate, Reaction: rash), Polysporin(bacitracin base) (Severity: Moderate, Reaction: rash), adhesive tape (Severity: Moderate) Family History Heart Disease - Father, No family history of Cancer, Diabetes, Hereditary Spherocytosis,  Hypertension, Kidney Disease, Lung Disease, Seizures, Stroke, Thyroid Problems, Tuberculosis. Social History Never smoker, Marital Status - Widowed, Alcohol Use - Never, Drug Use - No History, Caffeine Use - Never. Medical History Eyes Patient has history of Cataracts Ear/Nose/Mouth/Throat Denies history of Chronic sinus problems/congestion Hematologic/Lymphatic Patient has history of Lymphedema Cardiovascular Patient has history of Congestive Heart Failure, Coronary Artery Disease, Peripheral Venous Disease Endocrine Denies history of Type I Diabetes, Type II Diabetes Musculoskeletal Patient has history of Rheumatoid Arthritis, Osteoarthritis Neurologic Patient has history of Neuropathy Psychiatric Denies history of Anorexia/bulimia, Confinement Anxiety Hospitalization/Surgery History - Abdominal hysterectomy. - cataract extraction. - Tonsillectomy. Medical A Surgical History Notes nd Cardiovascular stenosis of both subclavian arteries Neurologic vertigo Review of Systems (ROS) Constitutional Symptoms (General Health) Complains or has symptoms of Marked Weight Change - lost 33 lb over 2 years. Denies complaints or symptoms of Fatigue, Fever, Chills. Eyes Complains or has symptoms of Vision Changes, Glasses / Contacts - glasses. Denies complaints or symptoms of Dry Eyes. Gastrointestinal Denies complaints or symptoms of Frequent diarrhea, Nausea, Vomiting. Endocrine Denies complaints or symptoms of Heat/cold intolerance. Integumentary (Skin) Complains or has symptoms of Wounds - R ft, heal, 2nd toe. Musculoskeletal Complains or has symptoms of Muscle Pain, Muscle Weakness. Neurologic Complains or has symptoms of Numbness/parasthesias - RLE. Objective Constitutional No acute distress. Vitals Time Taken: 2:06 PM, Height: 63 in, Source: Stated, Weight: 95 lbs, Source: Stated, BMI: 16.8, Temperature: 97.9 F, Pulse: 99 bpm, Respiratory Rate: 16 breaths/min, Blood  Pressure: 118/78 mmHg. Respiratory Normal work of breathing on room air.. Cardiovascular Nonpalpable. General Notes: 04/03/2022: On the DIP joint of the right second toe, there is a very superficial wound with a light layer of eschar present. Underneath, the opening is pinpoint. On the medial aspect of her right heel, there is  a small ulcer with slough and eschar covering the surface. Underneath this, there is good granulation tissue. No erythema, induration, malodor, or purulent drainage. Integumentary (Hair, Skin) Wound #1 status is Open. Original cause of wound was Pressure Injury. The date acquired was: 03/06/2022. The wound is located on the Dorsal T Second. The oe wound measures 0.3cm length x 0.2cm width x 0.1cm depth; 0.047cm^2 area and 0.005cm^3 volume. There is Fat Layer (Subcutaneous Tissue) exposed. There is no tunneling or undermining noted. There is a medium amount of serosanguineous drainage noted. The wound margin is flat and intact. There is large (67-100%) pink granulation within the wound bed. There is a small (1-33%) amount of necrotic tissue within the wound bed including Eschar. Wound #2 status is Open. Original cause of wound was Gradually Appeared. The date acquired was: 04/04/2021. The wound is located on the Right,Medial Calcaneus. The wound measures 0.5cm length x 0.4cm width x 0.3cm depth; 0.157cm^2 area and 0.047cm^3 volume. There is no tunneling or undermining noted. There is a medium amount of serosanguineous drainage noted. There is large (67-100%) pink granulation within the wound bed. There is a small (1-33%) amount of necrotic tissue within the wound bed including Eschar and Adherent Slough. Assessment Active Problems ICD-10 Non-pressure chronic ulcer of right heel and midfoot with fat layer exposed Non-pressure chronic ulcer of other part of right foot limited to breakdown of skin Peripheral vascular disease, unspecified Heart failure,  unspecified Polyneuropathy, unspecified Long term (current) use of systemic steroids Procedures Wound #1 Pre-procedure diagnosis of Wound #1 is a Pressure Ulcer located on the Dorsal T Second . There was a Selective/Open Wound Non-Viable Tissue oe Debridement with a total area of 0.06 sq cm performed by Fredirick Maudlin, MD. With the following instrument(s): Curette to remove Non-Viable tissue/material. Material removed includes Eschar and Slough and after achieving pain control using Lidocaine 5% topical ointment. No specimens were taken. A time out was conducted at 15:02, prior to the start of the procedure. A Minimum amount of bleeding was controlled with Pressure. The procedure was tolerated well with a pain level of 0 throughout and a pain level of 0 following the procedure. Post Debridement Measurements: 0.3cm length x 0.2cm width x 0.1cm depth; 0.005cm^3 volume. Post debridement Stage noted as Category/Stage II. Character of Wound/Ulcer Post Debridement is improved. Post procedure Diagnosis Wound #1: Same as Pre-Procedure Wound #2 Pre-procedure diagnosis of Wound #2 is a T be determined located on the Right,Medial Calcaneus . There was a Selective/Open Wound Non-Viable Tissue o Debridement with a total area of 0.2 sq cm performed by Fredirick Maudlin, MD. With the following instrument(s): Curette to remove Non-Viable tissue/material. Material removed includes Eschar and Slough and after achieving pain control using Lidocaine 5% topical ointment. No specimens were taken. A time out was conducted at 15:02, prior to the start of the procedure. A Minimum amount of bleeding was controlled with Pressure. The procedure was tolerated well. Post Debridement Measurements: 0.5cm length x 0.4cm width x 0.3cm depth; 0.047cm^3 volume. Character of Wound/Ulcer Post Debridement requires further debridement. Post procedure Diagnosis Wound #2: Same as Pre-Procedure Plan Follow-up Appointments: Return  Appointment in 1 week. - Dr. Celine Ahr- rm 4 Thursday 04/11/22 at 11:00 AM Anesthetic: Wound #1 Dorsal T Second: oe (In clinic) Topical Lidocaine 5% applied to wound bed Wound #2 Right,Medial Calcaneus: (In clinic) Topical Lidocaine 5% applied to wound bed Bathing/ Shower/ Hygiene: May shower and wash wound with soap and water. Off-Loading: Heel suspension boot to: Home Health: Wound #  1 Dorsal T Second: oe New wound care orders this week; continue Home Health for wound care. May utilize formulary equivalent dressing for wound treatment orders unless otherwise specified. Wound #2 Right,Medial Calcaneus: New wound care orders this week; continue Home Health for wound care. May utilize formulary equivalent dressing for wound treatment orders unless otherwise specified. WOUND #1: - T Second Wound Laterality: Dorsal oe Cleanser: Soap and Water 3 x Per Week/ Discharge Instructions: May shower and wash wound with dial antibacterial soap and water prior to dressing change. Peri-Wound Care: Sween Lotion (Moisturizing lotion) 3 x Per Week/ Discharge Instructions: Apply moisturizing lotion as directed Prim Dressing: KerraCel Ag Gelling Fiber Dressing, 4x5 in (silver alginate) 3 x Per Week/ ary Discharge Instructions: Apply silver alginate to wound bed as instructed Secondary Dressing: ALLEVYN Heel 4 1/2in x 5 1/2in / 10.5cm x 13.5cm 3 x Per Week/ Discharge Instructions: Apply over primary dressing as directed. Secured With: [employer], 4.5x3.1 (in/yd) 3 x Per Week/ Discharge Instructions: Secure with Kerlix as directed. Com pression Wrap: Kerlix Roll 4.5x3.1 (in/yd) 3 x Per Week/ Discharge Instructions: Apply Kerlix and Coban compression as directed. WOUND #2: - Calcaneus Wound Laterality: Right, Medial Cleanser: Soap and Water 3 x Per Week/ Discharge Instructions: May shower and wash wound with dial antibacterial soap and water prior to dressing change. Peri-Wound Care: Sween Lotion  (Moisturizing lotion) 3 x Per Week/ Discharge Instructions: Apply moisturizing lotion as directed Prim Dressing: KerraCel Ag Gelling Fiber Dressing, 4x5 in (silver alginate) 3 x Per Week/ ary Discharge Instructions: Apply silver alginate to wound bed as instructed Secondary Dressing: ALLEVYN Heel 4 1/2in x 5 1/2in / 10.5cm x 13.5cm 3 x Per Week/ Discharge Instructions: Apply over primary dressing as directed. Secured With: [employer], 4.5x3.1 (in/yd) 3 x Per Week/ Discharge Instructions: Secure with Kerlix as directed. Com pression Wrap: Kerlix Roll 4.5x3.1 (in/yd) 3 x Per Week/ Discharge Instructions: Apply Kerlix and Coban compression as directed. 04/03/2022: This is an 88 year old woman who has had a chronic heel ulcer and a fairly recent toe ulcer, referred by podiatry for further evaluation and management. On the DIP joint of the right second toe, there is a very superficial wound with a light layer of eschar present. Underneath, the opening is pinpoint. On the medial aspect of her right heel, there is a small ulcer with slough and eschar covering the surface. Underneath this, there is good granulation tissue. No erythema, induration, malodor, or purulent drainage. I debrided eschar off of the right second toe wound with a curette and similarly debrided slough and eschar from the heel wound. We will apply silver alginate to both sites and pad her heel with a heel cup. Wrap with Kerlix and Ace. Follow-up in 1 week. Electronic Signature(s) Signed: 04/03/2022 3:57:44 PM By: Fredirick Maudlin MD FACS Entered By: Fredirick Maudlin on 04/03/2022 15:57:44 -------------------------------------------------------------------------------- HxROS Details Patient Name: Date of Service: Stacey Sellar M. 04/03/2022 1:30 PM Medical Record Number: [id number] Patient Account Number: [id number] Date of Birth/Sex: Treating RN: 14-Feb-1933 (88 y.o. Marta Lamas Primary Care Provider: Grier Mitts Other Clinician: Referring Provider: Treating Provider/Extender: Antionette Poles in Treatment: 0 Information Obtained From Patient Other: grandaughter Constitutional Symptoms ([place]) Complaints and Symptoms: Positive for: Marked Weight Change - lost 33 lb over 2 years Negative for: Fatigue; Fever; Chills Eyes Complaints and Symptoms: Positive for: Vision Changes; Glasses / Contacts - glasses Negative for: Dry Eyes Medical History: Positive for: Cataracts Gastrointestinal Complaints  and Symptoms: Negative for: Frequent diarrhea; Nausea; Vomiting Endocrine Complaints and Symptoms: Negative for: Heat/cold intolerance Medical History: Negative for: Type I Diabetes; Type II Diabetes Integumentary (Skin) Complaints and Symptoms: Positive for: Wounds - R ft, heal, 2nd toe Musculoskeletal Complaints and Symptoms: Positive for: Muscle Pain; Muscle Weakness Medical History: Positive for: Rheumatoid Arthritis; Osteoarthritis Neurologic Complaints and Symptoms: Positive for: Numbness/parasthesias - RLE Medical History: Positive for: Neuropathy Past Medical History Notes: vertigo Ear/Nose/Mouth/Throat Medical History: Negative for: Chronic sinus problems/congestion Hematologic/Lymphatic Medical History: Positive for: Lymphedema Cardiovascular Medical History: Positive for: Congestive Heart Failure; Coronary Artery Disease; Peripheral Venous Disease Past Medical History Notes: stenosis of both subclavian arteries Psychiatric Medical History: Negative for: Anorexia/bulimia; Confinement Anxiety HBO Extended History Items Eyes: Cataracts Immunizations Pneumococcal Vaccine: Received Pneumococcal Vaccination: Yes Received Pneumococcal Vaccination On or After 60th Birthday: Yes Implantable Devices None Hospitalization / Surgery History Type of Hospitalization/Surgery Abdominal hysterectomy cataract  extraction Tonsillectomy Family and Social History Cancer: No; Diabetes: No; Heart Disease: Yes - Father; Hereditary Spherocytosis: No; Hypertension: No; Kidney Disease: No; Lung Disease: No; Seizures: No; Stroke: No; Thyroid Problems: No; Tuberculosis: No; Never smoker; Marital Status - Widowed; Alcohol Use: Never; Drug Use: No History; Caffeine Use: Never; Financial Concerns: No; Food, Clothing or Shelter Needs: No; Support System Lacking: No; Transportation Concerns: No Physician Affirmation I have reviewed and agree with the above information. Electronic Signature(s) Signed: 04/04/2022 7:33:54 AM By: Duanne Guess MD FACS Signed: 04/05/2022 7:35:53 AM By: Tommie Ard RN Previous Signature: 04/03/2022 4:55:45 PM Version By: Duanne Guess MD FACS Entered By: Tommie Ard on 04/03/2022 17:15:27 -------------------------------------------------------------------------------- SuperBill Details Patient Name: Date of Service: Stacey Knife. 04/03/2022 Medical Record Number: [id number] Patient Account Number: [id number] Date of Birth/Sex: Treating RN: 09/18/1932 (88 y.o. Fredderick Phenix Primary Care Provider: Abbe Amsterdam Other Clinician: Referring Provider: Treating Provider/Extender: Perley Jain in Treatment: 0 Diagnosis Coding ICD-10 Codes Code Description [phone] Non-pressure chronic ulcer of right heel and midfoot with fat layer exposed L97.511 Non-pressure chronic ulcer of other part of right foot limited to breakdown of skin I73.9 Peripheral vascular disease, unspecified I50.9 Heart failure, unspecified G62.9 Polyneuropathy, unspecified Z79.52 Long term (current) use of systemic steroids Facility Procedures CPT4 Code: [id number] Description: 99213 - WOUND CARE VISIT-LEV 3 EST PT Modifier: Quantity: 1 CPT4 Code: [id number] Description: 97597 - DEBRIDE WOUND 1ST 20 SQ CM OR < ICD-10 Diagnosis Description L97.412 Non-pressure chronic  ulcer of right heel and midfoot with fat layer exposed L97.511 Non-pressure chronic ulcer of other part of right foot limited to breakdown of s Modifier: kin Quantity: 1 Physician Procedures : CPT4 Code Description Modifier [id number] - WC PHYS LEVEL 4 - NEW PT 25 ICD-10 Diagnosis Description L97.412 Non-pressure chronic ulcer of right heel and midfoot with fat layer exposed L97.511 Non-pressure chronic ulcer of other part of right foot  limited to breakdown of skin I73.9 Peripheral vascular disease, unspecified Z79.52 Long term (current) use of systemic steroids Quantity: 1 : [id number] 97597 - WC PHYS DEBR WO ANESTH 20 SQ CM ICD-10 Diagnosis Description L97.412 Non-pressure chronic ulcer of right heel and midfoot with fat layer exposed L97.511 Non-pressure chronic ulcer of other part of right foot limited to breakdown of skin Quantity: 1 Electronic Signature(s) Signed: 04/04/2022 7:33:54 AM By: Duanne Guess MD FACS Signed: 04/05/2022 7:35:53 AM By: Tommie Ard RN Previous Signature: 04/03/2022 3:58:06 PM Version By: Duanne Guess MD FACS Entered By: Tommie Ard on 04/03/2022 17:14:32

## 2022-04-04 DIAGNOSIS — Z4800 Encounter for change or removal of nonsurgical wound dressing: Secondary | ICD-10-CM | POA: Diagnosis not present

## 2022-04-04 DIAGNOSIS — L97412 Non-pressure chronic ulcer of right heel and midfoot with fat layer exposed: Secondary | ICD-10-CM | POA: Diagnosis not present

## 2022-04-04 DIAGNOSIS — M05771 Rheumatoid arthritis with rheumatoid factor of right ankle and foot without organ or systems involvement: Secondary | ICD-10-CM | POA: Diagnosis not present

## 2022-04-04 DIAGNOSIS — L97511 Non-pressure chronic ulcer of other part of right foot limited to breakdown of skin: Secondary | ICD-10-CM | POA: Diagnosis not present

## 2022-04-04 DIAGNOSIS — I739 Peripheral vascular disease, unspecified: Secondary | ICD-10-CM | POA: Diagnosis not present

## 2022-04-04 DIAGNOSIS — L03115 Cellulitis of right lower limb: Secondary | ICD-10-CM | POA: Diagnosis not present

## 2022-04-04 NOTE — Telephone Encounter (Signed)
It was done.

## 2022-04-05 NOTE — Telephone Encounter (Signed)
Pt called to say she is still waiting for a refill for the traMADol (ULTRAM) 50 MG tablet for a long time now.  Pt called the Pharmacy and was told the Rx has 2 different dosages.  LOV:  07/03/2021  Pt stated she is currently living in an assisted living and no longer drives because she is in a wheelchair.   Pt stated her niece can bring her in for an OV.  Pt was scheduled for Monday - 04/08/22 to come in to discuss medication refills.

## 2022-04-05 NOTE — Progress Notes (Signed)
Stacey [name], Stacey [name] ([id number]) Visit Report for 04/03/2022 Abuse Risk Screen Details Patient Name: Date of Service: Stacey [name]. 04/03/2022 1:30 PM Medical Record Number: [id number] Patient Account Number: [id number] Date of Birth/Sex: Treating RN: 06-09-1933 (88 y.o. Kateri Mc Primary Care [place] Lust: Abbe Amsterdam Other Clinician: Referring Lemya Greenwell: Treating Thaniel Coluccio/Extender: Perley Jain in Treatment: 0 Abuse Risk Screen Items Answer ABUSE RISK SCREEN: Has anyone close to you tried to hurt or harm you recentlyo No Do you feel uncomfortable with anyone in your familyo No Has anyone forced you do things that you didnt want to doo No Electronic Signature(s) Signed: 04/05/2022 7:35:53 AM By: Tommie Ard RN Entered By: Tommie Ard on 04/03/2022 14:25:19 -------------------------------------------------------------------------------- Activities of Daily Living Details Patient Name: Date of Service: Stacey [name]. 04/03/2022 1:30 PM Medical Record Number: [id number] Patient Account Number: [id number] Date of Birth/Sex: Treating RN: January 23, 1933 (88 y.o. Kateri Mc Primary Care Robertine Kipper: Abbe Amsterdam Other Clinician: Referring Ryan Palermo: Treating Danaya Geddis/Extender: Perley Jain in Treatment: 0 Activities of Daily Living Items Answer Activities of Daily Living (Please select one for each item) Drive Automobile Not Able T Medications ake Completely Able Use T elephone Completely Able Care for Appearance Completely Able Use T oilet Completely Able Bath / Shower Completely Able Dress Self Completely Able Feed Self Completely Able Walk Need Assistance Get In / Out Bed Completely Able Housework Not Able Prepare Meals Need Assistance Handle Money Need Assistance Shop for Self Need Assistance Electronic Signature(s) Signed: 04/05/2022 7:35:53 AM By: Tommie Ard RN Entered By: Tommie Ard on  04/03/2022 14:26:30 -------------------------------------------------------------------------------- Education Screening Details Patient Name: Date of Service: Stacey Pearson M. 04/03/2022 1:30 PM Medical Record Number: [id number] Patient Account Number: [id number] Date of Birth/Sex: Treating RN: 11-22-1932 (88 y.o. Kateri Mc Primary Care Helio Lack: Abbe [place] Other Clinician: Referring Chalese Peach: Treating Mehmet Scally/Extender: Perley Jain in Treatment: 0 Learning Preferences/Education Level/Primary Language Learning Preference: Explanation Highest Education Level: College or Above Preferred Language: English Cognitive Barrier Language Barrier: No Translator Needed: No Memory Deficit: No Emotional Barrier: No Cultural/Religious Beliefs Affecting Medical Care: No Physical Barrier Impaired Vision: Yes Glasses Impaired Hearing: No Decreased Hand dexterity: Yes Knowledge/Comprehension Knowledge Level: High Comprehension Level: High Ability to understand written instructions: High Ability to understand verbal instructions: High Motivation Anxiety Level: Calm Cooperation: Cooperative Education Importance: Acknowledges Need Interest in Health Problems: Asks Questions Perception: Coherent Willingness to Engage in Self-Management High Activities: Readiness to Engage in Self-Management High Activities: Electronic Signature(s) Signed: 04/05/2022 7:35:53 AM By: Tommie Ard RN Entered By: Tommie Ard on 04/03/2022 14:27:22 -------------------------------------------------------------------------------- Fall Risk Assessment Details Patient Name: Date of Service: Stacey Pearson M. 04/03/2022 1:30 PM Medical Record Number: [id number] Patient Account Number: [id number] Date of Birth/Sex: Treating RN: [birth year] (88 y.o. Roselee Nova, Jamie Primary Care Loneta Tamplin: Abbe Amsterdam Other Clinician: Referring Danaiya Steadman: Treating  Maximilliano Kersh/Extender: Perley Jain in Treatment: 0 Fall Risk Assessment Items Have you had 2 or more falls in the last 12 monthso 0 Yes Have you had any fall that resulted in injury in the last 12 monthso 0 No FALLS RISK SCREEN History of falling - immediate or within 3 months 25 Yes Secondary diagnosis (Do you have 2 or more medical diagnoseso) 0 No Ambulatory aid None/bed rest/wheelchair/nurse 0 No Crutches/cane/walker 15 Yes Furniture 0 No Intravenous therapy Access/Saline/Heparin Lock 0 No Gait/Transferring Normal/ bed rest/ wheelchair 0 No Weak (short steps with or without shuffle, stooped  but able to lift head while walking, may seek 10 Yes support from furniture) Impaired (short steps with shuffle, may have difficulty arising from chair, head down, impaired 0 No balance) Mental Status Oriented to own ability 0 Yes Electronic Signature(s) Signed: 04/05/2022 7:35:53 AM By: Tommie Ard RN Entered By: Tommie Ard on 04/03/2022 14:29:00 -------------------------------------------------------------------------------- Foot Assessment Details Patient Name: Date of Service: Stacey Pearson M. 04/03/2022 1:30 PM Medical Record Number: [id number] Patient Account Number: [id number] Date of Birth/Sex: Treating RN: 11/03/1932 (88 y.o. Kateri Mc Primary Care Curry Seefeldt: Abbe Amsterdam Other Clinician: Referring Kayon Dozier: Treating Jartavious Mckimmy/Extender: Perley Jain in Treatment: 0 Foot Assessment Items Site Locations + = Sensation present, - = Sensation absent, C = Callus, U = Ulcer R = Redness, W = Warmth, M = Maceration, PU = Pre-ulcerative lesion F = Fissure, S = Swelling, D = Dryness Assessment Right: Left: Other Deformity: No No Prior Foot Ulcer: No No Prior Amputation: No No Charcot Joint: No No Ambulatory Status: Ambulatory With Help Assistance Device: Walker Gait: Surveyor, mining) Signed:  04/05/2022 7:35:53 AM By: Tommie Ard RN Entered By: Tommie Ard on 04/03/2022 14:32:10 -------------------------------------------------------------------------------- Nutrition Risk Screening Details Patient Name: Date of Service: Stacey [name]. 04/03/2022 1:30 PM Medical Record Number: [id number] Patient Account Number: [id number] Date of Birth/Sex: Treating RN: 1933-02-14 (88 y.o. Roselee Nova, Jamie Primary Care Shruti Arrey: Abbe Amsterdam Other Clinician: Referring Solstice Lastinger: Treating Fedor Kazmierski/Extender: Perley Jain in Treatment: 0 Height (in): 63 Weight (lbs): 95 Body Mass Index (BMI): 16.8 Nutrition Risk Screening Items Score Screening NUTRITION RISK SCREEN: I have an illness or condition that made me change the kind and/or amount of food I eat 2 Yes I eat fewer than two meals per day 0 No I eat few fruits and vegetables, or milk products 0 No I have three or more drinks of beer, liquor or wine almost every day 0 No I have tooth or mouth problems that make it hard for me to eat 0 No I don't always have enough money to buy the food I need 0 No I eat alone most of the time 1 Yes I take three or more different prescribed or over-the-counter drugs a day 1 Yes Without wanting to, I have lost or gained 10 pounds in the last six months 2 Yes I am not always physically able to shop, cook and/or feed myself 2 Yes Nutrition Protocols Good Risk Protocol Moderate Risk Protocol High Risk Proctocol 0 Provide education on nutrition Risk Level: High Risk Score: 8 Electronic Signature(s) Signed: 04/05/2022 7:35:53 AM By: Tommie Ard RN Entered By: Tommie Ard on 04/03/2022 14:30:19

## 2022-04-08 ENCOUNTER — Ambulatory Visit: Payer: Medicare Other | Admitting: Family Medicine

## 2022-04-08 ENCOUNTER — Other Ambulatory Visit: Payer: Self-pay | Admitting: Family Medicine

## 2022-04-08 ENCOUNTER — Telehealth: Payer: Self-pay | Admitting: Podiatry

## 2022-04-08 DIAGNOSIS — I739 Peripheral vascular disease, unspecified: Secondary | ICD-10-CM | POA: Diagnosis not present

## 2022-04-08 DIAGNOSIS — L97412 Non-pressure chronic ulcer of right heel and midfoot with fat layer exposed: Secondary | ICD-10-CM | POA: Diagnosis not present

## 2022-04-08 DIAGNOSIS — F419 Anxiety disorder, unspecified: Secondary | ICD-10-CM

## 2022-04-08 DIAGNOSIS — M05771 Rheumatoid arthritis with rheumatoid factor of right ankle and foot without organ or systems involvement: Secondary | ICD-10-CM | POA: Diagnosis not present

## 2022-04-08 DIAGNOSIS — L03115 Cellulitis of right lower limb: Secondary | ICD-10-CM | POA: Diagnosis not present

## 2022-04-08 DIAGNOSIS — Z4800 Encounter for change or removal of nonsurgical wound dressing: Secondary | ICD-10-CM | POA: Diagnosis not present

## 2022-04-08 DIAGNOSIS — L97511 Non-pressure chronic ulcer of other part of right foot limited to breakdown of skin: Secondary | ICD-10-CM | POA: Diagnosis not present

## 2022-04-08 NOTE — Telephone Encounter (Signed)
Pt called and is being treated by wound care that you sent her to, she is canceling the appt with you on 9.1 and will call to reschedule once she has been released from the wound care center.

## 2022-04-08 NOTE — Telephone Encounter (Signed)
Provider sent in refills.

## 2022-04-08 NOTE — Telephone Encounter (Signed)
Thank you :)

## 2022-04-08 NOTE — Progress Notes (Signed)
MARTICIA, REIFSCHNEIDER ([id number]) Visit Report for 04/03/2022 Allergy List Details Patient Name: Date of Service: Stacey [name]. 04/03/2022 1:30 PM Medical Record Number: [id number] Patient Account Number: [id number] Date of Birth/Sex: Treating RN: [birth year] (88 y.o. Kateri Mc Primary Care Coutney Wildermuth: Abbe [place] Other Clinician: Referring Samin Milke: Treating Neeka Urista/Extender: Perley Jain in Treatment: 0 Allergies Active Allergies sulfa antibiotics Reaction: anaphylaxis Severity: Severe Type: Medication codeine Reaction: nausea and vomiting Severity: Moderate latex Severity: Moderate Shellfish Containing Products Severity: Moderate Neosporin (neo-bac-polym) Reaction: rash Severity: Moderate Polysporin(bacitracin base) Reaction: rash Severity: Moderate adhesive tape Severity: Moderate Allergy Notes Electronic Signature(s) Signed: 04/05/2022 7:35:53 AM By: Tommie Ard RN Entered By: Tommie Ard on 04/03/2022 14:12:49 -------------------------------------------------------------------------------- Arrival Information Details Patient Name: Date of Service: Stacey Pearson M. 04/03/2022 1:30 PM Medical Record Number: [id number] Patient Account Number: [id number] Date of Birth/Sex: Treating RN: February 07, 1933 (88 y.o. Kateri Mc Primary Care Carrissa Taitano: Abbe [place] Other Clinician: Referring Nial Hawe: Treating Merelyn Klump/Extender: Perley Jain in Treatment: 0 Visit Information Patient Arrived: Wheel Chair Arrival Time: 14:06 Accompanied By: grandaughter Transfer Assistance: EasyPivot Patient Lift Patient Identification Verified: Yes Secondary Verification Process Completed: Yes Patient Requires Transmission-Based No Precautions: Patient Has Alerts: Yes Patient Alerts: ABI: Noncompressible RLE Notes ABI: Noncompressible RLE Electronic Signature(s) Signed: 04/05/2022 7:35:53 AM By: Tommie Ard RN Entered By: Tommie Ard on 04/03/2022 17:37:08 -------------------------------------------------------------------------------- Clinic Level of Care Assessment Details Patient Name: Date of Service: Stacey [name]. 04/03/2022 1:30 PM Medical Record Number: [id number] Patient Account Number: [id number] Date of Birth/Sex: Treating RN: 09/21/1932 (88 y.o. Kateri Mc Primary Care Deaundre Allston: Abbe [place] Other Clinician: Referring Mindi Akerson: Treating Princessa Lesmeister/Extender: Perley Jain in Treatment: 0 Clinic Level of Care Assessment Items TOOL 1 Quantity Score X- 1 0 Use when EandM and Procedure is performed on INITIAL visit ASSESSMENTS - Nursing Assessment / Reassessment X- 1 20 General Physical Exam (combine w/ comprehensive assessment (listed just below) when performed on new pt. evals) X- 1 25 Comprehensive Assessment (HX, ROS, Risk Assessments, Wounds Hx, etc.) ASSESSMENTS - Wound and Skin Assessment / Reassessment X- 1 10 Dermatologic / Skin Assessment (not related to wound area) ASSESSMENTS - Ostomy and/or Continence Assessment and Care []  - 0 Incontinence Assessment and Management []  - 0 Ostomy Care Assessment and Management (repouching, etc.) PROCESS - Coordination of Care X - Simple Patient / Family Education for ongoing care 1 15 []  - 0 Complex (extensive) Patient / Family Education for ongoing care X- 1 10 Staff obtains , Records, T Results / Process Orders est X- 1 10 Staff telephones HHA, Nursing Homes / Clarify orders / etc []  - 0 Routine Transfer to another Facility (non-emergent condition) []  - 0 Routine Hospital Admission (non-emergent condition) []  - 0 New Admissions / / Ordering NPWT Apligraf, etc. , []  - 0 Emergency Hospital Admission (emergent condition) PROCESS - Special Needs []  - 0 Pediatric / Minor Patient Management []  - 0 Isolation Patient Management []  - 0 Hearing  / Language / Visual special needs []  - 0 Assessment of Community assistance (transportation, D/C planning, etc.) []  - 0 Additional assistance / Altered mentation []  - 0 Support Surface(s) Assessment (bed, cushion, seat, etc.) INTERVENTIONS - Miscellaneous []  - 0 External ear exam []  - 0 Patient Transfer (multiple staff / / Similar devices) X- 1 5 Simple Staple / Suture removal (25 or less) []  - 0 Complex Staple / Suture removal (26 or more) []  -  0 Hypo/Hyperglycemic Management (do not check if billed separately)  - 0 Ankle / Brachial Index (ABI) - do not check if billed separately Has the patient been seen at the hospital within the last three years: Yes Total Score: 95 Level Of Care: New/Established - Level 3 Electronic Signature(s) Signed: 04/05/2022 7:35:53 AM By: Tommie Ard RN Entered By: Tommie Ard on 04/03/2022 17:11:19 -------------------------------------------------------------------------------- Encounter Discharge Information Details Patient Name: Date of Service: Stacey Pearson M. 04/03/2022 1:30 PM Medical Record Number: [id number] Patient Account Number: [id number] Date of Birth/Sex: Treating RN: November 16, 1932 (88 y.o. Kateri Mc Primary Care Jahniah Pallas: Abbe [place] Other Clinician: Referring Aldean Suddeth: Treating Jessamine Barcia/Extender: Perley Jain in Treatment: 0 Encounter Discharge Information Items Post Procedure Vitals Discharge Condition: Stable Temperature (F): 97.9 Ambulatory Status: Wheelchair Pulse (bpm): 99 Discharge Destination: Home Respiratory Rate (breaths/min): 16 Transportation: Private Auto Blood Pressure (mmHg): 118/78 Accompanied By: grandaughter Schedule Follow-up Appointment: Yes Clinical Summary of Care: Electronic Signature(s) Signed: 04/05/2022 7:35:53 AM By: Tommie Ard RN Entered By: Tommie Ard on 04/03/2022  17:18:38 -------------------------------------------------------------------------------- Lower Extremity Assessment Details Patient Name: Date of Service: Stacey Pearson M. 04/03/2022 1:30 PM Medical Record Number: [id number] Patient Account Number: [id number] Date of Birth/Sex: Treating RN: 1932-10-14 (88 y.o. Kateri Mc Primary Care Rayelle Armor: Abbe [place] Other Clinician: Referring Marsia Cino: Treating Miamarie Moll/Extender: Perley Jain in Treatment: 0 Edema Assessment Assessed: [Left: No] [Right: No] [Left: Edema] [Right: :] Calf Left: Right: Point of Measurement: From Medial Instep 27 cm Ankle Left: Right: Point of Measurement: From Medial Instep 19.5 cm Knee To Floor Left: Right: From Medial Instep 41.5 cm Vascular Assessment Pulses: Dorsalis Pedis Palpable: [Right:Yes] Electronic Signature(s) Signed: 04/05/2022 7:35:53 AM By: Tommie Ard RN Entered By: Tommie Ard on 04/03/2022 14:33:18 -------------------------------------------------------------------------------- Multi Wound Chart Details Patient Name: Date of Service: Stacey Pearson M. 04/03/2022 1:30 PM Medical Record Number: [id number] Patient Account Number: [id number] Date of Birth/Sex: Treating RN: 1932/11/23 (88 y.o. Fredderick Phenix Primary Care Amariyana Heacox: Abbe [place] Other Clinician: Referring Avraj Lindroth: Treating Saman Umstead/Extender: Perley Jain in Treatment: 0 Vital Signs Height(in): 63 Pulse(bpm): 99 Weight(lbs): 95 Blood Pressure(mmHg): 118/78 Body Mass Index(BMI): 16.8 Temperature(F): 97.9 Respiratory Rate(breaths/min): 16 Photos: [N/A:N/A] Dorsal T Second oe Right, Medial Calcaneus N/A Wound Location: Pressure Injury Gradually Appeared N/A Wounding Event: Pressure Ulcer T be determined o N/A Primary Etiology: Cataracts, Lymphedema, Congestive Cataracts, Lymphedema, Congestive N/A Comorbid History: Heart Failure,  Coronary Artery Heart Failure, Coronary Artery Disease, Peripheral Venous Disease, Disease, Peripheral Venous Disease, Rheumatoid Arthritis, Osteoarthritis, Rheumatoid Arthritis, Osteoarthritis, Neuropathy Neuropathy 03/06/2022 04/04/2021 N/A Date Acquired: 0 0 N/A Weeks of Treatment: Open Open N/A Wound Status: No No N/A Wound Recurrence: 0.3x0.2x0.1 0.5x0.4x0.3 N/A Measurements L x W x D (cm) 0.047 0.157 N/A A (cm) : rea 0.005 0.047 N/A Volume (cm) : 0.00% 0.00% N/A % Reduction in Area: 0.00% 0.00% N/A % Reduction in Volume: Category/Stage II Full Thickness Without Exposed N/A Classification: Support Structures Medium Medium N/A Exudate A mount: Serosanguineous Serosanguineous N/A Exudate Type: red, brown red, brown N/A Exudate Color: Flat and Intact N/A N/A Wound Margin: Large (67-100%) Large (67-100%) N/A Granulation A mount: Pink Pink N/A Granulation Quality: Small (1-33%) Small (1-33%) N/A Necrotic A mount: Eschar Eschar, Adherent Slough N/A Necrotic Tissue: Fat Layer (Subcutaneous Tissue): Yes Fascia: No N/A Exposed Structures: Fascia: No Fat Layer (Subcutaneous Tissue): No Tendon: No Tendon: No Muscle: No Muscle: No Joint: No Joint: No Bone: No Bone: No Large (67-100%)  Medium (34-66%) N/A Epithelialization: Debridement - Selective/Open Wound Debridement - Selective/Open Wound N/A Debridement: Pre-procedure Verification/Time Out 15:02 15:02 N/A Taken: Lidocaine 5% topical ointment Lidocaine 5% topical ointment N/A Pain Control: Necrotic/Eschar, Slough Necrotic/Eschar, Slough N/A Tissue Debrided: Non-Viable Tissue Non-Viable Tissue N/A Level: 0.06 0.2 N/A Debridement A (sq cm): rea Curette Curette N/A Instrument: Minimum Minimum N/A Bleeding: Pressure Pressure N/A Hemostasis A chieved: 0 N/A N/A Procedural Pain: 0 N/A N/A Post Procedural Pain: Procedure was tolerated well Procedure was tolerated well N/A Debridement Treatment  Response: 0.3x0.2x0.1 0.5x0.4x0.3 N/A Post Debridement Measurements L x W x D (cm) 0.005 0.047 N/A Post Debridement Volume: (cm) Category/Stage II N/A N/A Post Debridement Stage: Debridement Debridement N/A Procedures Performed: Treatment Notes Electronic Signature(s) Signed: 04/03/2022 3:35:32 PM By: Duanne Guess MD FACS Signed: 04/08/2022 5:02:03 PM By: Samuella Bruin Entered By: Duanne Guess on 04/03/2022 15:35:32 -------------------------------------------------------------------------------- Multi-Disciplinary Care Plan Details Patient Name: Date of Service: Stacey Pearson M. 04/03/2022 1:30 PM Medical Record Number: [id number] Patient Account Number: [id number] Date of Birth/Sex: Treating RN: 08/22/1932 (88 y.o. Kateri Mc Primary Care Maxtyn Nuzum: Abbe [place] Other Clinician: Referring Krish Bailly: Treating Dustine Stickler/Extender: Perley Jain in Treatment: 0 Active Inactive Orientation to the Wound Care Program Nursing Diagnoses: Knowledge deficit related to the wound healing center program Goals: Patient/caregiver will verbalize understanding of the Wound Healing Center Program Date Initiated: 04/03/2022 Target Resolution Date: 05/01/2022 Goal Status: Active Interventions: Provide education on orientation to the wound center Notes: Wound/Skin Impairment Nursing Diagnoses: Impaired tissue integrity Goals: Ulcer/skin breakdown will have a volume reduction of 30% by week 4 Date Initiated: 04/03/2022 Target Resolution Date: 05/01/2022 Goal Status: Active Interventions: Assess patient/caregiver ability to obtain necessary supplies Assess patient/caregiver ability to perform ulcer/skin care regimen upon admission and as needed Treatment Activities: Skin care regimen initiated : 04/03/2022 Topical wound management initiated : 04/03/2022 Notes: Electronic Signature(s) Signed: 04/05/2022 7:35:53 AM By: Tommie Ard RN Entered  By: Tommie Ard on 04/03/2022 14:52:59 -------------------------------------------------------------------------------- Pain Assessment Details Patient Name: Date of Service: Stacey Pearson M. 04/03/2022 1:30 PM Medical Record Number: [id number] Patient Account Number: [id number] Date of Birth/Sex: Treating RN: 04/23/1933 (88 y.o. Kateri Mc Primary Care Syriah Delisi: Abbe [place] Other Clinician: Referring Laneta Guerin: Treating Parminder Cupples/Extender: Perley Jain in Treatment: 0 Active Problems Location of Pain Severity and Description of Pain Patient Has Paino No Site Locations Pain Management and Medication Current Pain Management: Electronic Signature(s) Signed: 04/05/2022 7:35:53 AM By: Tommie Ard RN Entered By: Tommie Ard on 04/03/2022 14:52:06 -------------------------------------------------------------------------------- Patient/Caregiver Education Details Patient Name: Date of Service: Stacey [name] 8/23/2023andnbsp1:30 PM Medical Record Number: [id number] Patient Account Number: [id number] Date of Birth/Gender: Treating RN: 02-Jun-1933 (88 y.o. Kateri Mc Primary Care Physician: Abbe [place] Other Clinician: Referring Physician: Treating Physician/Extender: Perley Jain in Treatment: 0 Education Assessment Education Provided To: Patient Education Topics Provided Welcome T [facility]: o Handouts: Welcome T [facility] o Methods: Explain/Verbal Responses: Reinforcements needed, State content correctly Wound Debridement: Handouts: Wound Debridement Methods: Explain/Verbal Responses: Reinforcements needed, State content correctly Electronic Signature(s) Signed: 04/05/2022 7:35:53 AM By: Tommie Ard RN Entered By: Tommie Ard on 04/03/2022 14:53:36 -------------------------------------------------------------------------------- Wound Assessment Details Patient  Name: Date of Service: Stacey Pearson M. 04/03/2022 1:30 PM Medical Record Number: [id number] Patient Account Number: [id number] Date of Birth/Sex: Treating RN: [birth year] (88 y.o. Kateri Mc Primary Care Angelos Wasco: Abbe Amsterdam Other Clinician: Referring Bernetta Sutley: Treating Sarya Linenberger/Extender: Duanne Guess  Louann Sjogren Weeks in Treatment: 0 Wound Status Wound Number: 1 Primary Pressure Ulcer Etiology: Wound Location: Right, Dorsal T Second oe Wound Open Wounding Event: Pressure Injury Status: Date Acquired: 03/06/2022 Comorbid Cataracts, Lymphedema, Congestive Heart Failure, Coronary Weeks Of Treatment: 0 History: Artery Disease, Peripheral Venous Disease, Rheumatoid Arthritis, Clustered Wound: No Osteoarthritis, Neuropathy Photos Wound Measurements Length: (cm) 0.3 Width: (cm) 0.2 Depth: (cm) 0.1 Area: (cm) 0.047 Volume: (cm) 0.005 % Reduction in Area: 0% % Reduction in Volume: 0% Epithelialization: Large (67-100%) Tunneling: No Undermining: No Wound Description Classification: Category/Stage II Wound Margin: Flat and Intact Exudate Amount: Medium Exudate Type: Serosanguineous Exudate Color: red, brown Foul Odor After Cleansing: No Slough/Fibrino Yes Wound Bed Granulation Amount: Large (67-100%) Exposed Structure Granulation Quality: Pink Fascia Exposed: No Necrotic Amount: Small (1-33%) Fat Layer (Subcutaneous Tissue) Exposed: Yes Necrotic Quality: Eschar Tendon Exposed: No Muscle Exposed: No Joint Exposed: No Bone Exposed: No Treatment Notes Wound #1 (Toe Second) Wound Laterality: Dorsal, Right Cleanser Soap and Water Discharge Instruction: May shower and wash wound with dial antibacterial soap and water prior to dressing change. Peri-Wound Care Sween Lotion (Moisturizing lotion) Discharge Instruction: Apply moisturizing lotion as directed Topical Primary Dressing KerraCel Ag Gelling Fiber Dressing, 4x5 in (silver  alginate) Discharge Instruction: Apply silver alginate to wound bed as instructed Secondary Dressing ALLEVYN Heel 4 1/2in x 5 1/2in / 10.5cm x 13.5cm Discharge Instruction: Apply over primary dressing as directed. Secured With [employer], 4.5x3.1 (in/yd) Discharge Instruction: Secure with Kerlix as directed. Compression Wrap Kerlix Roll 4.5x3.1 (in/yd) Discharge Instruction: Apply Kerlix and Coban compression as directed. Compression Stockings Add-Ons Electronic Signature(s) Signed: 04/05/2022 7:35:53 AM By: Tommie Ard RN Entered By: Tommie Ard on 04/03/2022 17:14:57 -------------------------------------------------------------------------------- Wound Assessment Details Patient Name: Date of Service: Stacey Pearson M. 04/03/2022 1:30 PM Medical Record Number: [id number] Patient Account Number: [id number] Date of Birth/Sex: Treating RN: [birth year] (88 y.o. Roselee Nova, Jamie Primary Care Carletta Feasel: Abbe Amsterdam Other Clinician: Referring Eleasha Cataldo: Treating Hendrix Yurkovich/Extender: Perley Jain in Treatment: 0 Wound Status Wound Number: 2 Primary T be determined o Etiology: Wound Location: Right, Medial Calcaneus Wound Open Wounding Event: Gradually Appeared Status: Date Acquired: 04/04/2021 Comorbid Cataracts, Lymphedema, Congestive Heart Failure, Coronary Weeks Of Treatment: 0 History: Artery Disease, Peripheral Venous Disease, Rheumatoid Arthritis, Clustered Wound: No Osteoarthritis, Neuropathy Photos Wound Measurements Length: (cm) 0.5 Width: (cm) 0.4 Depth: (cm) 0.3 Area: (cm) 0.157 Volume: (cm) 0.047 % Reduction in Area: 0% % Reduction in Volume: 0% Epithelialization: Medium (34-66%) Tunneling: No Undermining: No Wound Description Classification: Full Thickness Without Exposed Support Structures Exudate Amount: Medium Exudate Type: Serosanguineous Exudate Color: red, brown Foul Odor After Cleansing:  No Slough/Fibrino Yes Wound Bed Granulation Amount: Large (67-100%) Exposed Structure Granulation Quality: Pink Fascia Exposed: No Necrotic Amount: Small (1-33%) Fat Layer (Subcutaneous Tissue) Exposed: No Necrotic Quality: Eschar, Adherent Slough Tendon Exposed: No Muscle Exposed: No Joint Exposed: No Bone Exposed: No Electronic Signature(s) Signed: 04/05/2022 7:35:53 AM By: Tommie Ard RN Entered By: Tommie Ard on 04/03/2022 14:50:38 -------------------------------------------------------------------------------- Vitals Details Patient Name: Date of Service: Stacey Pearson M. 04/03/2022 1:30 PM Medical Record Number: [id number] Patient Account Number: [id number] Date of Birth/Sex: Treating RN: 09-Oct-1932 (88 y.o. Kateri Mc Primary Care Izaih Kataoka: Abbe Amsterdam Other Clinician: Referring Sammuel Blick: Treating Jonisha Kindig/Extender: Perley Jain in Treatment: 0 Vital Signs Time Taken: 14:06 Temperature (F): 97.9 Height (in): 63 Pulse (bpm): 99 Source: Stated Respiratory Rate (breaths/min): 16 Weight (lbs): 95 Blood Pressure (mmHg): 118/78  Source: Stated Reference Range: 80 - 120 mg / dl Body Mass Index (BMI): 16.8 Electronic Signature(s) Signed: 04/05/2022 7:35:53 AM By: Tommie Ard RN Entered By: Tommie Ard on 04/03/2022 14:09:33

## 2022-04-10 DIAGNOSIS — Z4800 Encounter for change or removal of nonsurgical wound dressing: Secondary | ICD-10-CM | POA: Diagnosis not present

## 2022-04-10 DIAGNOSIS — L03115 Cellulitis of right lower limb: Secondary | ICD-10-CM | POA: Diagnosis not present

## 2022-04-10 DIAGNOSIS — I739 Peripheral vascular disease, unspecified: Secondary | ICD-10-CM | POA: Diagnosis not present

## 2022-04-10 DIAGNOSIS — L97412 Non-pressure chronic ulcer of right heel and midfoot with fat layer exposed: Secondary | ICD-10-CM | POA: Diagnosis not present

## 2022-04-10 DIAGNOSIS — M05771 Rheumatoid arthritis with rheumatoid factor of right ankle and foot without organ or systems involvement: Secondary | ICD-10-CM | POA: Diagnosis not present

## 2022-04-10 DIAGNOSIS — L97511 Non-pressure chronic ulcer of other part of right foot limited to breakdown of skin: Secondary | ICD-10-CM | POA: Diagnosis not present

## 2022-04-11 ENCOUNTER — Encounter (HOSPITAL_BASED_OUTPATIENT_CLINIC_OR_DEPARTMENT_OTHER): Payer: Medicare Other | Admitting: General Surgery

## 2022-04-11 DIAGNOSIS — L97412 Non-pressure chronic ulcer of right heel and midfoot with fat layer exposed: Secondary | ICD-10-CM | POA: Diagnosis not present

## 2022-04-11 DIAGNOSIS — Z4800 Encounter for change or removal of nonsurgical wound dressing: Secondary | ICD-10-CM | POA: Diagnosis not present

## 2022-04-11 DIAGNOSIS — M05771 Rheumatoid arthritis with rheumatoid factor of right ankle and foot without organ or systems involvement: Secondary | ICD-10-CM | POA: Diagnosis not present

## 2022-04-11 DIAGNOSIS — I739 Peripheral vascular disease, unspecified: Secondary | ICD-10-CM | POA: Diagnosis not present

## 2022-04-11 DIAGNOSIS — L97511 Non-pressure chronic ulcer of other part of right foot limited to breakdown of skin: Secondary | ICD-10-CM | POA: Diagnosis not present

## 2022-04-11 DIAGNOSIS — L03115 Cellulitis of right lower limb: Secondary | ICD-10-CM | POA: Diagnosis not present

## 2022-04-12 ENCOUNTER — Ambulatory Visit: Payer: Medicare Other | Admitting: Podiatry

## 2022-04-12 ENCOUNTER — Encounter (HOSPITAL_BASED_OUTPATIENT_CLINIC_OR_DEPARTMENT_OTHER): Payer: Medicare Other | Attending: General Surgery | Admitting: General Surgery

## 2022-04-12 DIAGNOSIS — I739 Peripheral vascular disease, unspecified: Secondary | ICD-10-CM | POA: Insufficient documentation

## 2022-04-12 DIAGNOSIS — I509 Heart failure, unspecified: Secondary | ICD-10-CM | POA: Insufficient documentation

## 2022-04-12 DIAGNOSIS — M069 Rheumatoid arthritis, unspecified: Secondary | ICD-10-CM | POA: Insufficient documentation

## 2022-04-12 DIAGNOSIS — G629 Polyneuropathy, unspecified: Secondary | ICD-10-CM | POA: Insufficient documentation

## 2022-04-12 DIAGNOSIS — S91301A Unspecified open wound, right foot, initial encounter: Secondary | ICD-10-CM | POA: Diagnosis not present

## 2022-04-12 DIAGNOSIS — Z09 Encounter for follow-up examination after completed treatment for conditions other than malignant neoplasm: Secondary | ICD-10-CM | POA: Diagnosis not present

## 2022-04-12 DIAGNOSIS — Z872 Personal history of diseases of the skin and subcutaneous tissue: Secondary | ICD-10-CM | POA: Diagnosis not present

## 2022-04-12 DIAGNOSIS — Z7952 Long term (current) use of systemic steroids: Secondary | ICD-10-CM | POA: Diagnosis not present

## 2022-04-16 ENCOUNTER — Telehealth: Payer: Self-pay | Admitting: *Deleted

## 2022-04-16 DIAGNOSIS — L03115 Cellulitis of right lower limb: Secondary | ICD-10-CM | POA: Diagnosis not present

## 2022-04-16 DIAGNOSIS — L97412 Non-pressure chronic ulcer of right heel and midfoot with fat layer exposed: Secondary | ICD-10-CM | POA: Diagnosis not present

## 2022-04-16 DIAGNOSIS — L97511 Non-pressure chronic ulcer of other part of right foot limited to breakdown of skin: Secondary | ICD-10-CM | POA: Diagnosis not present

## 2022-04-16 DIAGNOSIS — M05771 Rheumatoid arthritis with rheumatoid factor of right ankle and foot without organ or systems involvement: Secondary | ICD-10-CM | POA: Diagnosis not present

## 2022-04-16 DIAGNOSIS — Z4800 Encounter for change or removal of nonsurgical wound dressing: Secondary | ICD-10-CM | POA: Diagnosis not present

## 2022-04-16 DIAGNOSIS — I739 Peripheral vascular disease, unspecified: Secondary | ICD-10-CM | POA: Diagnosis not present

## 2022-04-16 NOTE — Progress Notes (Signed)
YSELA, HETTINGER ([id number]) Visit Report for 04/12/2022 Arrival Information Details Patient Name: Date of Service: Jackelyn Knife. 04/12/2022 9:45 A M Medical Record Number: [id number] Patient Account Number: [id number] Date of Birth/Sex: Treating RN: 22-Oct-1932 (88 y.o. Kateri Mc Primary Care Malachi Kinzler: Abbe [place] Other Clinician: Referring Faysal Fenoglio: Treating Caidynce Muzyka/Extender: Loraine Grip in Treatment: 1 Visit Information History Since Last Visit All ordered tests and consults were completed: Yes Patient Arrived: Wheel Chair Added or deleted any medications: No Arrival Time: 09:52 Any new allergies or adverse reactions: No Accompanied By: grandaughter Had a fall or experienced change in No Transfer Assistance: EasyPivot Patient Lift activities of daily living that may affect Patient Identification Verified: Yes risk of falls: Secondary Verification Process Completed: Yes Signs or symptoms of abuse/neglect since last visito No Patient Requires Transmission-Based No Hospitalized since last visit: No Precautions: Implantable device outside of the clinic excluding No Patient Has Alerts: Yes cellular tissue based products placed in the center Patient Alerts: ABI: Noncompressible since last visit: RLE Has Dressing in Place as Prescribed: Yes Pain Present Now: Yes Electronic Signature(s) Signed: 04/16/2022 12:19:15 PM By: Tommie Ard RN Entered By: Tommie Ard on 04/12/2022 10:00:56 -------------------------------------------------------------------------------- Clinic Level of Care Assessment Details Patient Name: Date of Service: Jackelyn Knife. 04/12/2022 9:45 A M Medical Record Number: [id number] Patient Account Number: [id number] Date of Birth/Sex: Treating RN: August 27, 1932 (88 y.o. Roselee Nova, Jamie Primary Care Preesha Benjamin: Abbe [place] Other Clinician: Referring Aqil Goetting: Treating Kalden Wanke/Extender: Loraine Grip in Treatment: 1 Clinic Level of Care Assessment Items TOOL 4 Quantity Score X- 1 0 Use when only an EandM is performed on FOLLOW-UP visit ASSESSMENTS - Nursing Assessment / Reassessment X- 1 10 Reassessment of Co-morbidities (includes updates in patient status) []  - 0 Reassessment of Adherence to Treatment Plan ASSESSMENTS - Wound and Skin A ssessment / Reassessment []  - 0 Simple Wound Assessment / Reassessment - one wound X- 2 5 Complex Wound Assessment / Reassessment - multiple wounds []  - 0 Dermatologic / Skin Assessment (not related to wound area) ASSESSMENTS - Focused Assessment []  - 0 Circumferential Edema Measurements - multi extremities []  - 0 Nutritional Assessment / Counseling / Intervention []  - 0 Lower Extremity Assessment (monofilament, tuning fork, pulses) []  - 0 Peripheral Arterial Disease Assessment (using hand held doppler) ASSESSMENTS - Ostomy and/or Continence Assessment and Care []  - 0 Incontinence Assessment and Management []  - 0 Ostomy Care Assessment and Management (repouching, etc.) PROCESS - Coordination of Care X - Simple Patient / Family Education for ongoing care 1 15 []  - 0 Complex (extensive) Patient / Family Education for ongoing care X- 1 10 Staff obtains , Records, T Results / Process Orders est X- 1 10 Staff telephones HHA, Nursing Homes / Clarify orders / etc []  - 0 Routine Transfer to another Facility (non-emergent condition) []  - 0 Routine Hospital Admission (non-emergent condition) []  - 0 New Admissions / / Ordering NPWT Apligraf, etc. , []  - 0 Emergency Hospital Admission (emergent condition) X- 1 10 Simple Discharge Coordination []  - 0 Complex (extensive) Discharge Coordination PROCESS - Special Needs []  - 0 Pediatric / Minor Patient Management []  - 0 Isolation Patient Management []  - 0 Hearing / Language / Visual special needs []  - 0 Assessment of Community assistance  (transportation, D/C planning, etc.) []  - 0 Additional assistance / Altered mentation []  - 0 Support Surface(s) Assessment (bed, cushion, seat, etc.) INTERVENTIONS - Wound Cleansing / Measurement X -  Simple Wound Cleansing - one wound 1 5 []  - 0 Complex Wound Cleansing - multiple wounds X- 1 5 Wound Imaging (photographs - any number of wounds) []  - 0 Wound Tracing (instead of photographs) X- 1 5 Simple Wound Measurement - one wound []  - 0 Complex Wound Measurement - multiple wounds INTERVENTIONS - Wound Dressings X - Small Wound Dressing one or multiple wounds 2 10 []  - 0 Medium Wound Dressing one or multiple wounds []  - 0 Large Wound Dressing one or multiple wounds []  - 0 Application of Medications - topical []  - 0 Application of Medications - injection INTERVENTIONS - Miscellaneous []  - 0 External ear exam []  - 0 Specimen Collection (cultures, biopsies, blood, body fluids, etc.) []  - 0 Specimen(s) / Culture(s) sent or taken to Lab for analysis []  - 0 Patient Transfer (multiple staff / [employer] Service fast streamer / Similar devices) []  - 0 Simple Staple / Suture removal (25 or less) []  - 0 Complex Staple / Suture removal (26 or more) []  - 0 Hypo / Hyperglycemic Management (close monitor of Blood Glucose) []  - 0 Ankle / Brachial Index (ABI) - do not check if billed separately X- 1 5 Vital Signs Has the patient been seen at the hospital within the last three years: Yes Total Score: 105 Level Of Care: New/Established - Level 3 Electronic Signature(s) Signed: 04/16/2022 12:19:15 PM By: Blanche East RN Entered By: Blanche East on 04/12/2022 11:08:00 -------------------------------------------------------------------------------- Encounter Discharge Information Details Patient Name: Date of Service: Dulce Sellar M. 04/12/2022 9:45 A M Medical Record Number: [id number] Patient Account Number: [id number] Date of Birth/Sex: Treating RN: [birth year] (88 y.o. Marta Lamas Primary  Care Lyn Deemer: Grier Mitts Other Clinician: Referring Sly Parlee: Treating Marciel Offenberger/Extender: Sabino Snipes in Treatment: 1 Encounter Discharge Information Items Discharge Condition: Stable Ambulatory Status: Wheelchair Discharge Destination: Home Transportation: Private Auto Accompanied By: Granddaughter Schedule Follow-up Appointment: No Clinical Summary of Care: Electronic Signature(s) Signed: 04/16/2022 12:19:15 PM By: Blanche East RN Entered By: Blanche East on 04/12/2022 11:09:28 -------------------------------------------------------------------------------- Lower Extremity Assessment Details Patient Name: Date of Service: Dulce Sellar M. 04/12/2022 9:45 A M Medical Record Number: [id number] Patient Account Number: [id number] Date of Birth/Sex: Treating RN: 06-27-1933 (88 y.o. Marta Lamas Primary Care Charmane Protzman: Grier Mitts Other Clinician: Referring Morgen Linebaugh: Treating Shayma Pfefferle/Extender: Anson [place] Weeks in Treatment: 1 Edema Assessment Assessed: [Left: No] [Right: No] [Left: Edema] [Right: :] Calf Left: Right: Point of Measurement: From Medial Instep 27.4 cm Ankle Left: Right: Point of Measurement: From Medial Instep 19 cm Vascular Assessment Pulses: Dorsalis Pedis Palpable: [Right:Yes] Electronic Signature(s) Signed: 04/16/2022 12:19:15 PM By: Blanche East RN Entered By: Blanche East on 04/12/2022 10:02:42 -------------------------------------------------------------------------------- Multi Wound Chart Details Patient Name: Date of Service: Dulce Sellar M. 04/12/2022 9:45 A M Medical Record Number: [id number] Patient Account Number: [id number] Date of Birth/Sex: Treating RN: 1932/12/04 (88 y.o. F) Primary Care Deaven Urwin: Grier Mitts Other Clinician: Referring Nayelly Laughman: Treating Maverik Foot/Extender: Sabino Snipes in Treatment: 1 Vital Signs Height(in): 23 Pulse(bpm):  8 Weight(lbs): 95 Blood Pressure(mmHg): 95/58 Body Mass Index(BMI): 16.8 Temperature(F): 98.2 Respiratory Rate(breaths/min): 18 Photos: Right, Dorsal T Second oe Right, Medial Calcaneus Midline Forehead Wound Location: Pressure Injury Gradually Appeared Skin T ear/Laceration Wounding Event: Pressure Ulcer T be determined o Abrasion Primary Etiology: Cataracts, Lymphedema, Congestive Cataracts, Lymphedema, Congestive Cataracts, Lymphedema, Congestive Comorbid History: Heart Failure, Coronary Artery Heart Failure, Coronary Artery Heart Failure, Coronary Artery Disease, Peripheral Venous Disease, Disease, Peripheral Venous Disease,  Disease, Peripheral Venous Disease, Rheumatoid Arthritis, Osteoarthritis, Rheumatoid Arthritis, Osteoarthritis, Rheumatoid Arthritis, Osteoarthritis, Neuropathy Neuropathy Neuropathy 03/06/2022 04/04/2021 02/27/2022 Date Acquired: 1 1 0 Weeks of Treatment: Open Open Open Wound Status: No No No Wound Recurrence: 0x0x0 0x0x0 0x0x0 Measurements L x W x D (cm) 0 0 0 A (cm) : rea 0 0 0 Volume (cm) : 100.00% 100.00% N/A % Reduction in Area: 100.00% 100.00% N/A % Reduction in Volume: Category/Stage II Full Thickness Without Exposed Full Thickness Without Exposed Classification: Support Structures Support Structures None Present None Present None Present Exudate Amount: Large (67-100%) Large (67-100%) Large (67-100%) Granulation Amount: N/A N/A Pink Granulation Quality: None Present (0%) None Present (0%) Small (1-33%) Necrotic Amount: Fascia: No Fascia: No Fat Layer (Subcutaneous Tissue): Yes Exposed Structures: Fat Layer (Subcutaneous Tissue): No Fat Layer (Subcutaneous Tissue): No Fascia: No Tendon: No Tendon: No Tendon: No Muscle: No Muscle: No Muscle: No Joint: No Joint: No Joint: No Bone: No Bone: No Bone: No None Large (67-100%) Large (67-100%) Epithelialization: Treatment Notes Electronic Signature(s) Signed: 04/12/2022  10:19:06 AM By: Fredirick Maudlin MD FACS Entered By: Fredirick Maudlin on 04/12/2022 10:19:05 -------------------------------------------------------------------------------- Multi-Disciplinary Care Plan Details Patient Name: Date of Service: Dulce Sellar M. 04/12/2022 9:45 A M Medical Record Number: [id number] Patient Account Number: [id number] Date of Birth/Sex: Treating RN: 1933/06/30 (88 y.o. Marta Lamas Primary Care Minal Stuller: Grier Mitts Other Clinician: Referring Linder Prajapati: Treating Marquail Bradwell/Extender: Sabino Snipes in Treatment: 1 Active Inactive Orientation to the Wound Care Program Nursing Diagnoses: Knowledge deficit related to the wound healing center program Goals: Patient/caregiver will verbalize understanding of the [place] Program Date Initiated: 04/03/2022 Target Resolution Date: 05/01/2022 Goal Status: Active Interventions: Provide education on orientation to the wound center Notes: Wound/Skin Impairment Nursing Diagnoses: Impaired tissue integrity Goals: Ulcer/skin breakdown will have a volume reduction of 30% by week 4 Date Initiated: 04/03/2022 Target Resolution Date: 05/01/2022 Goal Status: Active Interventions: Assess patient/caregiver ability to obtain necessary supplies Assess patient/caregiver ability to perform ulcer/skin care regimen upon admission and as needed Treatment Activities: Skin care regimen initiated : 04/03/2022 Topical wound management initiated : 04/03/2022 Notes: Electronic Signature(s) Signed: 04/16/2022 12:19:15 PM By: Blanche East RN Entered By: Blanche East on 04/12/2022 10:10:01 -------------------------------------------------------------------------------- Pain Assessment Details Patient Name: Date of Service: Dulce Sellar M. 04/12/2022 9:45 A M Medical Record Number: [id number] Patient Account Number: [id number] Date of Birth/Sex: Treating RN: [birth year] (88 y.o. Marta Lamas Primary Care Marvina Danner: Grier Mitts Other Clinician: Referring Crysta Gulick: Treating Lucifer Soja/Extender: Anson [place] Weeks in Treatment: 1 Active Problems Location of Pain Severity and Description of Pain Patient Has Paino Yes Site Locations Pain Location: Generalized Pain Rate the pain. Current Pain Level: 5 Character of Pain Describe the Pain: Aching, Throbbing Pain Management and Medication Current Pain Management: Electronic Signature(s) Signed: 04/16/2022 12:19:15 PM By: Blanche East RN Entered By: Blanche East on 04/12/2022 10:01:34 -------------------------------------------------------------------------------- Patient/Caregiver Education Details Patient Name: Date of Service: Donia Pounds. 9/1/2023andnbsp9:45 A M Medical Record Number: [id number] Patient Account Number: [id number] Date of Birth/Gender: Treating RN: June 05, 1933 (88 y.o. Marta Lamas Primary Care Physician: Grier Mitts Other Clinician: Referring Physician: Treating Physician/Extender: Sabino Snipes in Treatment: 1 Education Assessment Education Provided To: Patient Education Topics Provided Welcome T [facility] o Methods: Explain/Verbal Responses: Reinforcements needed, State content correctly Electronic Signature(s) Signed: 04/16/2022 12:19:15 PM By: Blanche East RN Entered By: Blanche East on 04/12/2022 10:10:17 -------------------------------------------------------------------------------- Wound Assessment Details  Patient Name: Date of Service: Donia Pounds. 04/12/2022 9:45 A M Medical Record Number: [id number] Patient Account Number: [id number] Date of Birth/Sex: Treating RN: [birth year] (88 y.o. Iver Nestle, Jamie Primary Care Cohen Doleman: Grier Mitts Other Clinician: Referring Zadin Lange: Treating Mats Jeanlouis/Extender: Anson [place] Weeks in Treatment: 1 Wound Status Wound Number: 1  Primary Pressure Ulcer Etiology: Wound Location: Right, Dorsal T Second oe Wound Open Wounding Event: Pressure Injury Status: Date Acquired: 03/06/2022 Comorbid Cataracts, Lymphedema, Congestive Heart Failure, Coronary Weeks Of Treatment: 1 History: Artery Disease, Peripheral Venous Disease, Rheumatoid Arthritis, Clustered Wound: No Osteoarthritis, Neuropathy Photos Wound Measurements Length: (cm) Width: (cm) Depth: (cm) Area: (cm) Volume: (cm) 0 % Reduction in Area: 100% 0 % Reduction in Volume: 100% 0 Epithelialization: None 0 Tunneling: No 0 Undermining: No Wound Description Classification: Category/Stage II Exudate Amount: None Present Foul Odor After Cleansing: No Slough/Fibrino No Wound Bed Granulation Amount: Large (67-100%) Exposed Structure Necrotic Amount: None Present (0%) Fascia Exposed: No Fat Layer (Subcutaneous Tissue) Exposed: No Tendon Exposed: No Muscle Exposed: No Joint Exposed: No Bone Exposed: No Electronic Signature(s) Signed: 04/16/2022 12:19:15 PM By: Blanche East RN Entered By: Blanche East on 04/12/2022 10:16:38 -------------------------------------------------------------------------------- Wound Assessment Details Patient Name: Date of Service: Dulce Sellar M. 04/12/2022 9:45 A M Medical Record Number: [id number] Patient Account Number: [id number] Date of Birth/Sex: Treating RN: [birth year] (88 y.o. Iver Nestle, Jamie Primary Care Dajane Valli: Grier Mitts Other Clinician: Referring Cacey Willow: Treating Karina Lenderman/Extender: Anson [place] Weeks in Treatment: 1 Wound Status Wound Number: 2 Primary T be determined o Etiology: Wound Location: Right, Medial Calcaneus Wound Open Wounding Event: Gradually Appeared Status: Date Acquired: 04/04/2021 Comorbid Cataracts, Lymphedema, Congestive Heart Failure, Coronary Weeks Of Treatment: 1 History: Artery Disease, Peripheral Venous Disease, Rheumatoid Arthritis, Clustered  Wound: No Osteoarthritis, Neuropathy Photos Wound Measurements Length: (cm) Width: (cm) Depth: (cm) Area: (cm) Volume: (cm) 0 % Reduction in Area: 100% 0 % Reduction in Volume: 100% 0 Epithelialization: Large (67-100%) 0 Tunneling: No 0 Undermining: No Wound Description Classification: Full Thickness Without Exposed Support Structures Exudate Amount: None Present Foul Odor After Cleansing: No Slough/Fibrino No Wound Bed Granulation Amount: Large (67-100%) Exposed Structure Necrotic Amount: None Present (0%) Fascia Exposed: No Fat Layer (Subcutaneous Tissue) Exposed: No Tendon Exposed: No Muscle Exposed: No Joint Exposed: No Bone Exposed: No Electronic Signature(s) Signed: 04/16/2022 12:19:15 PM By: Blanche East RN Entered By: Blanche East on 04/12/2022 10:17:03 -------------------------------------------------------------------------------- Wound Assessment Details Patient Name: Date of Service: Dulce Sellar M. 04/12/2022 9:45 A M Medical Record Number: [id number] Patient Account Number: [id number] Date of Birth/Sex: Treating RN: 15-Jul-1933 (88 y.o. Iver Nestle, Jamie Primary Care Jasamine Pottinger: Grier Mitts Other Clinician: Referring Oiva Dibari: Treating Jenae Tomasello/Extender: Anson [place] Weeks in Treatment: 1 Wound Status Wound Number: 3 Primary Abrasion Etiology: Wound Location: Midline Forehead Wound Open Wounding Event: Skin Tear/Laceration Status: Date Acquired: 02/27/2022 Comorbid Cataracts, Lymphedema, Congestive Heart Failure, Coronary Weeks Of Treatment: 0 History: Artery Disease, Peripheral Venous Disease, Rheumatoid Arthritis, Clustered Wound: No Osteoarthritis, Neuropathy Photos Wound Measurements Length: (cm) Width: (cm) Depth: (cm) Area: (cm) Volume: (cm) 0 % Reduction in Area: 0 % Reduction in Volume: 0 Epithelialization: Large (67-100%) 0 Tunneling: No 0 Undermining: No Wound Description Classification: Full  Thickness Without Exposed Support Structures Exudate Amount: None Present Foul Odor After Cleansing: No Slough/Fibrino No Wound Bed Granulation Amount: Large (67-100%) Exposed Structure Granulation Quality: Pink Fascia Exposed: No Necrotic Amount: Small (1-33%) Fat Layer (Subcutaneous Tissue) Exposed: Yes Tendon Exposed: No  Muscle Exposed: No Joint Exposed: No Bone Exposed: No Electronic Signature(s) Signed: 04/16/2022 12:19:15 PM By: Tommie Ard RN Entered By: Tommie Ard on 04/12/2022 10:17:13 -------------------------------------------------------------------------------- Vitals Details Patient Name: Date of Service: Tonye Pearson M. 04/12/2022 9:45 A M Medical Record Number: [id number] Patient Account Number: [id number] Date of Birth/Sex: Treating RN: 23-Jan-1933 (88 y.o. Roselee Nova, Jamie Primary Care Heavenleigh Petruzzi: Abbe [place] Other Clinician: Referring Gabriel Paulding: Treating Cruise Baumgardner/Extender: Loraine Grip in Treatment: 1 Vital Signs Time Taken: 10:00 Temperature (F): 98.2 Height (in): 63 Pulse (bpm): 72 Weight (lbs): 95 Respiratory Rate (breaths/min): 18 Body Mass Index (BMI): 16.8 Blood Pressure (mmHg): 95/58 Reference Range: 80 - 120 mg / dl Electronic Signature(s) Signed: 04/16/2022 12:19:15 PM By: Tommie Ard RN Entered By: Tommie Ard on 04/12/2022 10:01:20

## 2022-04-16 NOTE — Progress Notes (Signed)
Stacey [name] ([id number]) Visit Report for 04/12/2022 Chief Complaint Document Details Patient Name: Date of Service: Stacey [name]. 04/12/2022 9:45 A M Medical Record Number: [id number] Patient Account Number: [id number] Date of Birth/Sex: Treating RN: 1933-04-28 (88 y.o. F) Primary Care Provider: Abbe Amsterdam Other Clinician: Referring Provider: Treating Provider/Extender: Loraine Grip in Treatment: 1 Information Obtained from: Patient Chief Complaint Patient seen for complaints of Non-Healing Wounds. Electronic Signature(s) Signed: 04/12/2022 10:19:11 AM By: Duanne Guess MD FACS Entered By: Duanne Guess on 04/12/2022 10:19:11 -------------------------------------------------------------------------------- HPI Details Patient Name: Date of Service: Stacey [name] M. 04/12/2022 9:45 A M Medical Record Number: [id number] Patient Account Number: [id number] Date of Birth/Sex: Treating RN: 05/03/1933 (88 y.o. F) Primary Care Provider: Abbe Amsterdam Other Clinician: Referring Provider: Treating Provider/Extender: Loraine Grip in Treatment: 1 History of Present Illness HPI Description: ADMISSION 04/03/2022 This is an 88 year old nondiabetic with congestive heart failure, peripheral vascular disease, rheumatoid arthritis on long-term steroids, and neuropathy who was referred by podiatry for further evaluation and management of wounds on her right foot. She says that the ulcer on her right heel has been present for a year and the ulcer on her right second toe has been present for about a month. As I read the podiatry notes, it sounds as though they were referring her for hyperbaric oxygen therapy evaluation, but I do not see an indication for this. They have been applying Iodosorb and she does have home health coming twice a week to assist with her dressing changes. She also is capable of changing them on her own when she  bathes. ABI in clinic today was noncompressible. She had formal ABIs done on July 20, 2021. +-------+---------------+-----------+------------+------------+ ABI/TBIT oday's ABI T oday's TBIPrevious ABIPrevious TBI +-------+---------------+-----------+------------+------------+ Right Noncompressible0.43    +-------+---------------+-----------+------------+------------+ Left Noncompressible1.11    +-------+---------------+-----------+------------+------------+ On the DIP joint of the right second toe, there is a very superficial wound with a light layer of eschar present. Underneath, the opening is pinpoint. On the medial aspect of her right heel, there is a small ulcer with slough and eschar covering the surface. Underneath this, there is good granulation tissue. No erythema, induration, malodor, or purulent drainage. 04/12/2022: Her wounds are healed. Electronic Signature(s) Signed: 04/12/2022 10:19:27 AM By: Duanne Guess MD FACS Entered By: Duanne Guess on 04/12/2022 10:19:26 -------------------------------------------------------------------------------- Physical Exam Details Patient Name: Date of Service: Stacey [name] M. 04/12/2022 9:45 A M Medical Record Number: [id number] Patient Account Number: [id number] Date of Birth/Sex: Treating RN: 08-28-1932 (88 y.o. F) Primary Care Provider: Abbe Amsterdam Other Clinician: Referring Provider: Treating Provider/Extender: [name] in Treatment: 1 Constitutional Within normal range for this patient. . . . No acute distress.Marland Kitchen Respiratory Normal work of breathing on room air.. Notes 04/12/2022: Her wounds are healed. Electronic Signature(s) Signed: 04/12/2022 10:19:58 AM By: Duanne Guess MD FACS Entered By: Duanne Guess on 04/12/2022 10:19:58 -------------------------------------------------------------------------------- Physician Orders Details Patient Name: Date of  Service: Stacey [name] M. 04/12/2022 9:45 A M Medical Record Number: [id number] Patient Account Number: [id number] Date of Birth/Sex: Treating RN: Nov 29, 1932 (88 y.o. Kateri Mc Primary Care Provider: Abbe Amsterdam Other Clinician: Referring Provider: Treating Provider/Extender: Loraine Grip in Treatment: 1 Verbal / Phone Orders: No Diagnosis Coding ICD-10 Coding Code Description [phone] Non-pressure chronic ulcer of right heel and midfoot with fat layer exposed L97.511 Non-pressure chronic ulcer of other part of right foot limited to breakdown of skin I73.9 Peripheral  vascular disease, unspecified I50.9 Heart failure, unspecified G62.9 Polyneuropathy, unspecified Z79.52 Long term (current) use of systemic steroids Discharge [facility] Discharge from [facility] Care Center - Congratulations!! Anesthetic (In clinic) Topical Lidocaine 5% applied to wound bed Bathing/ Shower/ Hygiene May shower and wash wound with soap and water. Off-Loading Heel suspension boot to: Electronic Signature(s) Signed: 04/12/2022 1:39:13 PM By: Duanne Guess MD FACS Signed: 04/16/2022 12:19:15 PM By: Tommie Ard RN Previous Signature: 04/12/2022 11:38:22 AM Version By: Duanne Guess MD FACS Entered By: Tommie Ard on 04/12/2022 11:58:29 -------------------------------------------------------------------------------- Problem List Details Patient Name: Date of Service: Stacey [name] M. 04/12/2022 9:45 A M Medical Record Number: [id number] Patient Account Number: [id number] Date of Birth/Sex: Treating RN: 11-24-1932 (88 y.o. [name], Stacey [name] Primary Care Provider: Abbe Amsterdam Other Clinician: Referring Provider: Treating Provider/Extender: Loraine Grip in Treatment: 1 Active Problems ICD-10 Encounter Code Description Active Date MDM Diagnosis L97.412 Non-pressure chronic ulcer of right heel and midfoot with fat layer exposed  04/03/2022 No Yes L97.511 Non-pressure chronic ulcer of other part of right foot limited to breakdown of 04/03/2022 No Yes skin I73.9 Peripheral vascular disease, unspecified 04/03/2022 No Yes I50.9 Heart failure, unspecified 04/03/2022 No Yes G62.9 Polyneuropathy, unspecified 04/03/2022 No Yes Z79.52 Long term (current) use of systemic steroids 04/03/2022 No Yes Inactive Problems Resolved Problems Electronic Signature(s) Signed: 04/12/2022 10:19:01 AM By: Duanne Guess MD FACS Entered By: Duanne Guess on 04/12/2022 10:19:01 -------------------------------------------------------------------------------- Progress Note Details Patient Name: Date of Service: Stacey [name] M. 04/12/2022 9:45 A M Medical Record Number: [id number] Patient Account Number: [id number] Date of Birth/Sex: Treating RN: [birth year] (88 y.o. F) Primary Care Provider: Abbe Amsterdam Other Clinician: Referring Provider: Treating Provider/Extender: Loraine Grip in Treatment: 1 Subjective Chief Complaint Information obtained from Patient Patient seen for complaints of Non-Healing Wounds. History of Present Illness (HPI) ADMISSION 04/03/2022 This is an 88 year old nondiabetic with congestive heart failure, peripheral vascular disease, rheumatoid arthritis on long-term steroids, and neuropathy who was referred by podiatry for further evaluation and management of wounds on her right foot. She says that the ulcer on her right heel has been present for a year and the ulcer on her right second toe has been present for about a month. As I read the podiatry notes, it sounds as though they were referring her for hyperbaric oxygen therapy evaluation, but I do not see an indication for this. They have been applying Iodosorb and she does have home health coming twice a week to assist with her dressing changes. She also is capable of changing them on her own when she bathes. ABI in clinic today was  noncompressible. She had formal ABIs done on July 20, 2021. +-------+---------------+-----------+------------+------------+ ABI/TBIT oday's ABI T oday's TBIPrevious ABIPrevious TBI +-------+---------------+-----------+------------+------------+ Right Noncompressible0.43    +-------+---------------+-----------+------------+------------+ Left Noncompressible1.11    +-------+---------------+-----------+------------+------------+ On the DIP joint of the right second toe, there is a very superficial wound with a light layer of eschar present. Underneath, the opening is pinpoint. On the medial aspect of her right heel, there is a small ulcer with slough and eschar covering the surface. Underneath this, there is good granulation tissue. No erythema, induration, malodor, or purulent drainage. 04/12/2022: Her wounds are healed. Patient History Information obtained from Patient, Other: grandaughter. Family History Heart Disease - Father, No family history of Cancer, Diabetes, Hereditary Spherocytosis, Hypertension, Kidney Disease, Lung Disease, Seizures, Stroke, Thyroid Problems, Tuberculosis. Social History Never smoker, Marital Status - Widowed, Alcohol Use - Never, Drug Use -  No History, Caffeine Use - Never. Medical History Eyes Patient has history of Cataracts Ear/Nose/Mouth/Throat Denies history of Chronic sinus problems/congestion Hematologic/Lymphatic Patient has history of Lymphedema Cardiovascular Patient has history of Congestive Heart Failure, Coronary Artery Disease, Peripheral Venous Disease Endocrine Denies history of Type I Diabetes, Type II Diabetes Musculoskeletal Patient has history of Rheumatoid Arthritis, Osteoarthritis Neurologic Patient has history of Neuropathy Psychiatric Denies history of Anorexia/bulimia, Confinement Anxiety Hospitalization/Surgery History - Abdominal hysterectomy. - cataract extraction. - Tonsillectomy. Medical A Surgical  History Notes nd Cardiovascular stenosis of both subclavian arteries Neurologic vertigo Objective Constitutional Within normal range for this patient. No acute distress.. Vitals Time Taken: 10:00 AM, Height: 63 in, Weight: 95 lbs, BMI: 16.8, Temperature: 98.2 F, Pulse: 72 bpm, Respiratory Rate: 18 breaths/min, Blood Pressure: 95/58 mmHg. Respiratory Normal work of breathing on room air.. General Notes: 04/12/2022: Her wounds are healed. Integumentary (Hair, Skin) Wound #1 status is Open. Original cause of wound was Pressure Injury. The date acquired was: 03/06/2022. The wound has been in treatment 1 weeks. The wound is located on the Right,Dorsal T Second. The wound measures 0cm length x 0cm width x 0cm depth; 0cm^2 area and 0cm^3 volume. There is no oe tunneling or undermining noted. There is a none present amount of drainage noted. There is large (67-100%) granulation within the wound bed. There is no necrotic tissue within the wound bed. Wound #2 status is Open. Original cause of wound was Gradually Appeared. The date acquired was: 04/04/2021. The wound has been in treatment 1 weeks. The wound is located on the Right,Medial Calcaneus. The wound measures 0cm length x 0cm width x 0cm depth; 0cm^2 area and 0cm^3 volume. There is no tunneling or undermining noted. There is a none present amount of drainage noted. There is large (67-100%) granulation within the wound bed. There is no necrotic tissue within the wound bed. Wound #3 status is Open. Original cause of wound was Skin T ear/Laceration. The date acquired was: 02/27/2022. The wound is located on the Midline Forehead. The wound measures 0cm length x 0cm width x 0cm depth; 0cm^2 area and 0cm^3 volume. There is Fat Layer (Subcutaneous Tissue) exposed. There is no tunneling or undermining noted. There is a none present amount of drainage noted. There is large (67-100%) pink granulation within the wound bed. There is a small (1-33%) amount  of necrotic tissue within the wound bed. Assessment Active Problems ICD-10 Non-pressure chronic ulcer of right heel and midfoot with fat layer exposed Non-pressure chronic ulcer of other part of right foot limited to breakdown of skin Peripheral vascular disease, unspecified Heart failure, unspecified Polyneuropathy, unspecified Long term (current) use of systemic steroids Plan Follow-up Appointments: Return Appointment in 1 week. - Dr. Lady Gary- rm 4 Thursday 04/11/22 at 11:00 AM Bathing/ Shower/ Hygiene: May shower and wash wound with soap and water. Off-Loading: Heel suspension boot to: 04/12/2022: Her wounds are healed. She will be discharged from the wound care center. She may follow-up as needed. Electronic Signature(s) Signed: 04/12/2022 10:21:10 AM By: Duanne Guess MD FACS Entered By: Duanne Guess on 04/12/2022 10:21:09 -------------------------------------------------------------------------------- HxROS Details Patient Name: Date of Service: Stacey [name] M. 04/12/2022 9:45 A M Medical Record Number: [id number] Patient Account Number: [id number] Date of Birth/Sex: Treating RN: 1932/08/18 (88 y.o. F) Primary Care Provider: Abbe Amsterdam Other Clinician: Referring Provider: Treating Provider/Extender: Loraine Grip in Treatment: 1 Information Obtained From Patient Other: grandaughter Eyes Medical History: Positive for: Cataracts Ear/Nose/Mouth/Throat Medical History: Negative for: Chronic sinus  problems/congestion Hematologic/Lymphatic Medical History: Positive for: Lymphedema Cardiovascular Medical History: Positive for: Congestive Heart Failure; Coronary Artery Disease; Peripheral Venous Disease Past Medical History Notes: stenosis of both subclavian arteries Endocrine Medical History: Negative for: Type I Diabetes; Type II Diabetes Musculoskeletal Medical History: Positive for: Rheumatoid Arthritis;  Osteoarthritis Neurologic Medical History: Positive for: Neuropathy Past Medical History Notes: vertigo Psychiatric Medical History: Negative for: Anorexia/bulimia; Confinement Anxiety HBO Extended History Items Eyes: Cataracts Immunizations Pneumococcal Vaccine: Received Pneumococcal Vaccination: Yes Received Pneumococcal Vaccination On or After 60th Birthday: Yes Implantable Devices None Hospitalization / Surgery History Type of Hospitalization/Surgery Abdominal hysterectomy cataract extraction Tonsillectomy Family and Social History Cancer: No; Diabetes: No; Heart Disease: Yes - Father; Hereditary Spherocytosis: No; Hypertension: No; Kidney Disease: No; Lung Disease: No; Seizures: No; Stroke: No; Thyroid Problems: No; Tuberculosis: No; Never smoker; Marital Status - Widowed; Alcohol Use: Never; Drug Use: No History; Caffeine Use: Never; Financial Concerns: No; Food, Clothing or Shelter Needs: No; Support System Lacking: No; Transportation Concerns: No Electronic Signature(s) Signed: 04/12/2022 11:38:22 AM By: Duanne Guess MD FACS Entered By: Duanne Guess on 04/12/2022 10:19:32 -------------------------------------------------------------------------------- SuperBill Details Patient Name: Date of Service: Stacey [name] M. 04/12/2022 Medical Record Number: [id number] Patient Account Number: [id number] Date of Birth/Sex: Treating RN: Oct 14, 1932 (88 y.o. F) Primary Care Provider: Abbe Amsterdam Other Clinician: Referring Provider: Treating Provider/Extender: Loraine Grip in Treatment: 1 Diagnosis Coding ICD-10 Codes Code Description [phone] Non-pressure chronic ulcer of right heel and midfoot with fat layer exposed L97.511 Non-pressure chronic ulcer of other part of right foot limited to breakdown of skin I73.9 Peripheral vascular disease, unspecified I50.9 Heart failure, unspecified G62.9 Polyneuropathy, unspecified Z79.52 Long term  (current) use of systemic steroids Facility Procedures CPT4 Code: [id number] Description: 99213 - WOUND CARE VISIT-LEV 3 EST PT Modifier: 25 Quantity: 1 Physician Procedures : CPT4 Code Description Modifier [id number] - WC PHYS LEVEL 2 - EST PT ICD-10 Diagnosis Description L97.412 Non-pressure chronic ulcer of right heel and midfoot with fat layer exposed L97.511 Non-pressure chronic ulcer of other part of right foot  limited to breakdown of skin I73.9 Peripheral vascular disease, unspecified Z79.52 Long term (current) use of systemic steroids Quantity: 1 Electronic Signature(s) Signed: 04/12/2022 11:38:22 AM By: Duanne Guess MD FACS Signed: 04/16/2022 12:19:15 PM By: Tommie Ard RN Previous Signature: 04/12/2022 10:21:23 AM Version By: Duanne Guess MD FACS Entered By: Tommie Ard on 04/12/2022 11:08:13

## 2022-04-16 NOTE — Telephone Encounter (Signed)
Nurse w/ [facility] is calling to informed the physician that patient has been discharged from wound care, wounds healed.  They will decrease their order to 1 week  for 2 more weeks, then will discharge the patient.   Please advise.

## 2022-04-17 DIAGNOSIS — L97412 Non-pressure chronic ulcer of right heel and midfoot with fat layer exposed: Secondary | ICD-10-CM | POA: Diagnosis not present

## 2022-04-17 DIAGNOSIS — L03115 Cellulitis of right lower limb: Secondary | ICD-10-CM | POA: Diagnosis not present

## 2022-04-17 DIAGNOSIS — L97511 Non-pressure chronic ulcer of other part of right foot limited to breakdown of skin: Secondary | ICD-10-CM | POA: Diagnosis not present

## 2022-04-17 DIAGNOSIS — Z4800 Encounter for change or removal of nonsurgical wound dressing: Secondary | ICD-10-CM | POA: Diagnosis not present

## 2022-04-17 DIAGNOSIS — I739 Peripheral vascular disease, unspecified: Secondary | ICD-10-CM | POA: Diagnosis not present

## 2022-04-17 DIAGNOSIS — M05771 Rheumatoid arthritis with rheumatoid factor of right ankle and foot without organ or systems involvement: Secondary | ICD-10-CM | POA: Diagnosis not present

## 2022-04-18 ENCOUNTER — Other Ambulatory Visit: Payer: Self-pay | Admitting: Family Medicine

## 2022-04-22 ENCOUNTER — Ambulatory Visit (INDEPENDENT_AMBULATORY_CARE_PROVIDER_SITE_OTHER): Payer: Medicare Other

## 2022-04-22 ENCOUNTER — Encounter: Payer: Self-pay | Admitting: Family Medicine

## 2022-04-22 DIAGNOSIS — L03115 Cellulitis of right lower limb: Secondary | ICD-10-CM | POA: Diagnosis not present

## 2022-04-22 DIAGNOSIS — Z4800 Encounter for change or removal of nonsurgical wound dressing: Secondary | ICD-10-CM | POA: Diagnosis not present

## 2022-04-22 DIAGNOSIS — L97511 Non-pressure chronic ulcer of other part of right foot limited to breakdown of skin: Secondary | ICD-10-CM | POA: Diagnosis not present

## 2022-04-22 DIAGNOSIS — I739 Peripheral vascular disease, unspecified: Secondary | ICD-10-CM | POA: Diagnosis not present

## 2022-04-22 DIAGNOSIS — Z7901 Long term (current) use of anticoagulants: Secondary | ICD-10-CM | POA: Diagnosis not present

## 2022-04-22 DIAGNOSIS — M05771 Rheumatoid arthritis with rheumatoid factor of right ankle and foot without organ or systems involvement: Secondary | ICD-10-CM | POA: Diagnosis not present

## 2022-04-22 DIAGNOSIS — L97412 Non-pressure chronic ulcer of right heel and midfoot with fat layer exposed: Secondary | ICD-10-CM | POA: Diagnosis not present

## 2022-04-22 LAB — POCT INR: INR: 3.2 — AB (ref 2.0–3.0)

## 2022-04-22 NOTE — Progress Notes (Signed)
Received INR result of 3.2 from last night from [facility]. Pt tests at home with daughter's help, and results go to [facility].  Hold dose today and then continue 1 tablet daily except take 1/2 tablet on Mon, Wed, and Fri. Recheck in 3 weeks on 10/25.  Advised pt's daughter, Stacey [name], to hold dose today and then continue normal dosing and recheck in 2 weeks and if any changes to contact office. Pt's daughter verbalized understanding.

## 2022-04-22 NOTE — Patient Instructions (Addendum)
Pre visit review using our clinic review tool, if applicable. No additional management support is needed unless otherwise documented below in the visit note.  Hold dose today and then continue 1 tablet daily except take 1/2 tablet on Mon, Wed, and Fri. Recheck in 3 weeks on 10/25.

## 2022-04-25 ENCOUNTER — Ambulatory Visit (INDEPENDENT_AMBULATORY_CARE_PROVIDER_SITE_OTHER): Payer: Medicare Other | Admitting: Family Medicine

## 2022-04-25 ENCOUNTER — Encounter: Payer: Self-pay | Admitting: Family Medicine

## 2022-04-25 VITALS — BP 98/64 | HR 64 | Temp 98.4°F

## 2022-04-25 DIAGNOSIS — M069 Rheumatoid arthritis, unspecified: Secondary | ICD-10-CM

## 2022-04-25 DIAGNOSIS — H6123 Impacted cerumen, bilateral: Secondary | ICD-10-CM

## 2022-04-25 DIAGNOSIS — M159 Polyosteoarthritis, unspecified: Secondary | ICD-10-CM | POA: Diagnosis not present

## 2022-04-25 DIAGNOSIS — M40202 Unspecified kyphosis, cervical region: Secondary | ICD-10-CM | POA: Diagnosis not present

## 2022-04-25 DIAGNOSIS — Z7901 Long term (current) use of anticoagulants: Secondary | ICD-10-CM | POA: Diagnosis not present

## 2022-04-25 DIAGNOSIS — J302 Other seasonal allergic rhinitis: Secondary | ICD-10-CM | POA: Diagnosis not present

## 2022-04-25 DIAGNOSIS — B370 Candidal stomatitis: Secondary | ICD-10-CM

## 2022-04-25 DIAGNOSIS — K130 Diseases of lips: Secondary | ICD-10-CM

## 2022-04-25 DIAGNOSIS — M150 Primary generalized (osteo)arthritis: Secondary | ICD-10-CM

## 2022-04-25 MED ORDER — MAGIC MOUTHWASH W/LIDOCAINE: 5.0000 mL | Freq: Four times a day (QID) | ORAL | 0 refills | Status: DC | PRN

## 2022-04-25 MED ORDER — HYDROCORTISONE 1 % EX OINT: 1.0000 | TOPICAL_OINTMENT | Freq: Two times a day (BID) | CUTANEOUS | 0 refills | Status: DC

## 2022-04-25 MED ORDER — FEXOFENADINE HCL 60 MG PO TABS: 60.0000 mg | ORAL_TABLET | Freq: Every day | ORAL | 3 refills | Status: DC | PRN

## 2022-04-25 NOTE — Progress Notes (Signed)
Subjective:    Patient ID: Stacey [name], female    DOB: Jul 28, 1933, 88 y.o.   MRN: [id number]  Chief Complaint  Patient presents with   Follow-up  Pt accompanied by her daughter.  HPI Patient was seen today for f/u.  Pt states she has been doing well in ALF.  Was told she needed to make an appt for continued refills.  Pt has been stable on current medications.  Currently taking a tab of Allegra 180 mg daily.  Endorses difficulty breathing tab in half.  RA/OA stable on tramadol.  On chronic anticoagulation with Coumadin 2/2 history of chronic A-fib.  Patient notes waking up with llower lip edema and irritation for the last week.  In the past had similar symptoms associated with thrush.  Patient denies irritation of mouth.  Drinking more lemonade that can be tart.  Pt notes h/o cerumen impaction.  In the past had ears cleaned by ENT.  States that was painful.  Past Medical History:  Diagnosis Date   Atrial fibrillation (HCC)    Hernia of abdominal cavity    Kyphosis     Allergies  Allergen Reactions   Sulfa Antibiotics Anaphylaxis   Tape Other (See Comments)    BRUISES VERY EASILY!!!!   Codeine Nausea And Vomiting   Shellfish-Derived Products Other (See Comments)    Daughter was unsure of the reaction- "happened years ago" and patient avoids foods that contain shellfish   Latex Rash   Neosporin Original [Bacitracin-Neomycin-Polymyxin] Rash   Polysporin [Bacitracin-Polymyxin B] Rash    ROS General: Denies fever, chills, night sweats, changes in weight, changes in appetite +seasonal allergies HEENT: Denies headaches, ear pain, changes in vision, rhinorrhea, sore throat CV: Denies CP, palpitations, SOB, orthopnea Pulm: Denies SOB, cough, wheezing GI: Denies abdominal pain, nausea, vomiting, diarrhea, constipation GU: Denies dysuria, hematuria, frequency, vaginal discharge Msk: Denies muscle cramps, +chronic joint pain Neuro: Denies weakness, numbness, tingling  Skin: Denies  rashes, bruising  +edema of lower lip Psych: Denies depression, anxiety, hallucinations    Objective:    Blood pressure 98/64, pulse 64, temperature 98.4 F (36.9 C), temperature source Oral, SpO2 95 %.  Gen. Pleasant, well-nourished, in no distress, normal affect   HEENT: [place]/AT, face symmetric, conjunctiva clear, no scleral icterus, PERRLA, EOMI, nares patent without drainage, pharynx without erythema or exudate.  Scant white coating on tongue.  Bilateral canals impacted with soft cerumen. Lungs: no accessory muscle use, CTAB, no wheezes or rales Cardiovascular: RRR, no m/r/g, no peripheral edema Abdomen: BS present, soft, NT/ND Musculoskeletal: Moderate to severe kyphosis.  Deformities of fingers at PIP and DIP joints. No cyanosis or clubbing, normal tone Neuro:  A&Ox3, CN II-XII intact, gait not assessed as sitting in exam warm chair and using transport wheelchair Skin:  Warm, dry, intact.  Erythema and edema of the lower lip with whitish coating in R corner of mouth.  Skin white coating on tongue.   Wt Readings from Last 3 Encounters:  08/31/21 97 lb (44 kg)  07/16/21 90 lb (40.8 kg)  05/10/21 97 lb (44 kg)    Lab Results  Component Value Date   WBC 9.5 07/16/2021   HGB 11.1 (L) 07/16/2021   HCT 34.8 (L) 07/16/2021   PLT 273 07/16/2021   GLUCOSE 97 07/16/2021   ALT 16 12/07/2020   AST 28 12/07/2020   NA 135 07/16/2021   K 3.9 07/16/2021   CL 95 (L) 07/16/2021   CREATININE 0.82 07/16/2021   BUN 15 07/16/2021  CO2 29 07/16/2021   INR 3.2 (A) 04/22/2022    Assessment/Plan:  Seasonal allergies  -will send in Allegra 60 mg daily so pt won't have to break tabs in half -pt advised to d/c use of benadryl nightly due to anticholinergic effects - Plan: fexofenadine (ALLEGRA ALLERGY) 60 MG tablet  Bilateral impacted cerumen -OTC debrox ear gtts  Cheilitis  - Plan: magic mouthwash w/lidocaine SOLN, hydrocortisone 1 % ointment  Thrush  - Plan: magic mouthwash  w/lidocaine SOLN  Chronic anticoagulation -History of chronic A-fib, goal INR 2-3 -INR 3.2 on 9/11. -Patient instructed to hold Coumadin dose on 9/11 and resume regular dosing of Coumadin 1/2 tablet (2.5 mg on Wednesdays and Saturday.  1 tablet (5 mg) all other days.  Kyphosis of cervical region, unspecified kyphosis type -Stable  Primary osteoarthritis involving multiple joints -Stable -Continue current medications including tramadol  Rheumatoid arthritis involving multiple sites, unspecified whether rheumatoid factor present (HCC) -Stable -Continue prednisolone 5 mg daily and tramadol 100 mg BID prn  F/u as needed for continued or worsening symptoms    Stacey Amsterdam, MD

## 2022-04-25 NOTE — Patient Instructions (Signed)
A prescription for Allegra was sent to your pharmacy.  The next dose down is 60 mg.  A prescription for cortisone cream was also sent to your local pharmacy.  You can use this for the outside of your lips.  You can use Debrox eardrops to help remove earwax.  They can be found over-the-counter at your local drugstore.

## 2022-04-26 ENCOUNTER — Other Ambulatory Visit: Payer: Self-pay | Admitting: Interventional Cardiology

## 2022-04-26 MED ORDER — SPIRONOLACTONE 25 MG PO TABS: 12.5000 mg | ORAL_TABLET | Freq: Every day | ORAL | 3 refills | Status: DC

## 2022-04-27 DIAGNOSIS — M05771 Rheumatoid arthritis with rheumatoid factor of right ankle and foot without organ or systems involvement: Secondary | ICD-10-CM | POA: Diagnosis not present

## 2022-04-27 DIAGNOSIS — L03115 Cellulitis of right lower limb: Secondary | ICD-10-CM | POA: Diagnosis not present

## 2022-04-27 DIAGNOSIS — Z7901 Long term (current) use of anticoagulants: Secondary | ICD-10-CM | POA: Diagnosis not present

## 2022-04-27 DIAGNOSIS — M05772 Rheumatoid arthritis with rheumatoid factor of left ankle and foot without organ or systems involvement: Secondary | ICD-10-CM | POA: Diagnosis not present

## 2022-04-27 DIAGNOSIS — I4891 Unspecified atrial fibrillation: Secondary | ICD-10-CM | POA: Diagnosis not present

## 2022-04-27 DIAGNOSIS — L97412 Non-pressure chronic ulcer of right heel and midfoot with fat layer exposed: Secondary | ICD-10-CM | POA: Diagnosis not present

## 2022-04-27 DIAGNOSIS — Z4800 Encounter for change or removal of nonsurgical wound dressing: Secondary | ICD-10-CM | POA: Diagnosis not present

## 2022-04-27 DIAGNOSIS — I739 Peripheral vascular disease, unspecified: Secondary | ICD-10-CM | POA: Diagnosis not present

## 2022-04-27 DIAGNOSIS — L97511 Non-pressure chronic ulcer of other part of right foot limited to breakdown of skin: Secondary | ICD-10-CM | POA: Diagnosis not present

## 2022-04-27 DIAGNOSIS — Z9181 History of falling: Secondary | ICD-10-CM | POA: Diagnosis not present

## 2022-04-27 DIAGNOSIS — Z7952 Long term (current) use of systemic steroids: Secondary | ICD-10-CM | POA: Diagnosis not present

## 2022-04-29 DIAGNOSIS — R0602 Shortness of breath: Secondary | ICD-10-CM | POA: Diagnosis not present

## 2022-04-29 DIAGNOSIS — R051 Acute cough: Secondary | ICD-10-CM | POA: Diagnosis not present

## 2022-04-29 DIAGNOSIS — R531 Weakness: Secondary | ICD-10-CM | POA: Diagnosis not present

## 2022-04-29 DIAGNOSIS — Z03818 Encounter for observation for suspected exposure to other biological agents ruled out: Secondary | ICD-10-CM | POA: Diagnosis not present

## 2022-05-01 DIAGNOSIS — Z4800 Encounter for change or removal of nonsurgical wound dressing: Secondary | ICD-10-CM | POA: Diagnosis not present

## 2022-05-01 DIAGNOSIS — I739 Peripheral vascular disease, unspecified: Secondary | ICD-10-CM | POA: Diagnosis not present

## 2022-05-01 DIAGNOSIS — L97511 Non-pressure chronic ulcer of other part of right foot limited to breakdown of skin: Secondary | ICD-10-CM | POA: Diagnosis not present

## 2022-05-01 DIAGNOSIS — L03115 Cellulitis of right lower limb: Secondary | ICD-10-CM | POA: Diagnosis not present

## 2022-05-01 DIAGNOSIS — L97412 Non-pressure chronic ulcer of right heel and midfoot with fat layer exposed: Secondary | ICD-10-CM | POA: Diagnosis not present

## 2022-05-01 DIAGNOSIS — M05771 Rheumatoid arthritis with rheumatoid factor of right ankle and foot without organ or systems involvement: Secondary | ICD-10-CM | POA: Diagnosis not present

## 2022-05-06 ENCOUNTER — Other Ambulatory Visit: Payer: Self-pay | Admitting: Family Medicine

## 2022-05-06 DIAGNOSIS — I482 Chronic atrial fibrillation, unspecified: Secondary | ICD-10-CM

## 2022-05-06 DIAGNOSIS — I509 Heart failure, unspecified: Secondary | ICD-10-CM

## 2022-05-10 ENCOUNTER — Telehealth: Payer: Self-pay

## 2022-05-10 NOTE — Telephone Encounter (Signed)
Called and spoke to patient's daughter, Thayer Headings.  She stated she was not able to check her mother's INR at home this week.  She will check it either this weekend or on Monday and enter results into [facility] portal.

## 2022-05-12 LAB — POCT INR: INR: 2.6 (ref 2.0–3.0)

## 2022-05-13 ENCOUNTER — Ambulatory Visit (INDEPENDENT_AMBULATORY_CARE_PROVIDER_SITE_OTHER): Payer: Medicare Other

## 2022-05-13 DIAGNOSIS — Z7901 Long term (current) use of anticoagulants: Secondary | ICD-10-CM | POA: Diagnosis not present

## 2022-05-13 NOTE — Progress Notes (Addendum)
Received INR result of 2.6 from last night from [facility] home testing portal.   Continue 1 tablet daily except take 1/2 tablet on Mon, Wed, and Fri. Recheck in 2 weeks, on 10/16.   Advised pt and pt's daughter, Thayer Headings, of above dosing instructions and to contact the office if there are any changes. Verbalized understanding.

## 2022-05-13 NOTE — Patient Instructions (Signed)
Continue 1 tablet daily except take 1/2 tablet on Mon, Wed, and Fri. Recheck in 3 weeks, on 10/23.

## 2022-05-15 ENCOUNTER — Other Ambulatory Visit: Payer: Self-pay | Admitting: Interventional Cardiology

## 2022-05-26 LAB — POCT INR: INR: 2.1 (ref 2.0–3.0)

## 2022-05-27 ENCOUNTER — Ambulatory Visit (INDEPENDENT_AMBULATORY_CARE_PROVIDER_SITE_OTHER): Payer: Medicare Other

## 2022-05-27 DIAGNOSIS — Z7901 Long term (current) use of anticoagulants: Secondary | ICD-10-CM | POA: Diagnosis not present

## 2022-05-27 NOTE — Patient Instructions (Signed)
Continue 1 tablet daily except take 1/2 tablet on Mondays, Wednesdays, and Fridays. Recheck in 3 weeks, on 11/6.

## 2022-05-27 NOTE — Progress Notes (Signed)
Patient checked INR with home [facility] analyzer and submitted results via portal on 10/15. INR is 2.1  Continue 1 tablet daily except take 1/2 tablet on Mondays, Wednesdays, and Fridays. Recheck in 3 weeks, on 11/6.  Called pt's home and spoke to pt's daughter, Thayer Headings. Advised her of above dosing instructions and to contact the office if there are any changes. Verbalized understanding.

## 2022-05-29 ENCOUNTER — Telehealth: Payer: Self-pay | Admitting: Family Medicine

## 2022-05-29 ENCOUNTER — Telehealth: Payer: Self-pay | Admitting: Interventional Cardiology

## 2022-05-29 MED ORDER — POTASSIUM CHLORIDE CRYS ER 20 MEQ PO TBCR: EXTENDED_RELEASE_TABLET | ORAL | 0 refills | Status: DC

## 2022-05-29 NOTE — Telephone Encounter (Signed)
Requesting refill of the  prednisoLONE 5 MG TABS tablet  LOV:  04/25/22  [facility] [place], [place] Phone:  [phone]

## 2022-05-29 NOTE — Telephone Encounter (Signed)
*  STAT* If patient is at the pharmacy, call can be transferred to refill team.   1. Which medications need to be refilled? (please list name of each medication and dose if known)   potassium chloride SA (KLOR-CON M) 20 MEQ tablet  2. Which pharmacy/location (including street and city if local pharmacy) is medication to be sent to?  [facility] [place], [place]  3. Do they need a 30 day or 90 day supply? 90 days  Patient stated a new prescription of this medication will need to be written for 90 days and not the 30 days as written currently.  Patient stated the cost is significantly less for 90 days.

## 2022-05-29 NOTE — Telephone Encounter (Signed)
Called pt and left message informing her that her medication was sent to [facility] mail order pharmacy for 30 day supply, because she was overdue for an appointment with Dr. Tamala Julian and that she could call back or refill with PCP.

## 2022-05-30 NOTE — Telephone Encounter (Signed)
Refills were sent to [facility] on 9/8 for 1 year supply.

## 2022-06-02 NOTE — Progress Notes (Signed)
Office Visit    Patient Name: Stacey [name] Date of Encounter: 06/03/2022  PCP:  [name], MD   [facility]  Cardiologist:  Lesleigh Noe, MD  Advanced Practice Provider:  No care team member to display Electrophysiologist:  None   HPI    Stacey [name] is a 88 y.o. female with past medical history significant for persistent AF, chronic anticoagulation (Xarelto then GI bleed then Coumadin), chronic diastolic heart failure, bilateral subclavian stenosis, and severe kyphoscoliosis presents today for annual follow-up appointment.  She was last seen 04/24/2021 and was accompanied by her daughter.  The patient did not have any complaints at that time.  No shortness of breath, swelling, or any other cardiac issues.  Today, she is doing well from a cardiac standpoint.  She tries to drink 6 glasses of water a day.  She is here because the pharmacy would not give her a 90-day supply since she had not been in to see [place] in over a year.  She has lower extremity edema on and off.  She is tolerating all of her medications without any issues.  Her last INR was therapeutic.  She was on Xarelto at one point but it caused internal bleeding.  She has not noticed any bleeding on Coumadin.  At one point she was on metoprolol 100 mg twice a day and she did not tolerate it so she was changed back to 50 mg twice a day.  She is having atrial fibrillation with rapid ventricular response today.  Rate is 101 bpm.  She is asymptomatic.  She is wanting her gabapentin increased.  I asked her to please address this with Dr. Salomon Fick.  Reports no shortness of breath nor dyspnea on exertion. Reports no chest pain, pressure, or tightness. No edema, orthopnea, PND. Reports no palpitations.  Past Medical History    Past Medical History:  Diagnosis Date   Atrial fibrillation (HCC)    Hernia of abdominal cavity    Kyphosis    Past Surgical History:  Procedure Laterality Date    ABDOMINAL HYSTERECTOMY     CATARACT EXTRACTION, BILATERAL     TONSILLECTOMY      Allergies  Allergies  Allergen Reactions   Sulfa Antibiotics Anaphylaxis   Tape Other (See Comments)    BRUISES VERY EASILY!!!!   Codeine Nausea And Vomiting   Hydroxychloroquine     Other reaction(s): GI upset   Shellfish-Derived Products Other (See Comments)    Daughter was unsure of the reaction- "happened years ago" and patient avoids foods that contain shellfish   Latex Rash   Neosporin Original [Bacitracin-Neomycin-Polymyxin] Rash   Polysporin [Bacitracin-Polymyxin B] Rash    EKGs/Labs/Other Studies Reviewed:   The following studies were reviewed today: ECHOCARDIOGRAM 2019: Study Conclusions   - Left ventricle: The cavity size was normal. Systolic function was    normal. The estimated ejection fraction was in the range of 60%    to 65%. Wall motion was normal; there were no regional wall    motion abnormalities. The study is not technically sufficient to    allow evaluation of LV diastolic function.  - Aortic valve: Sclerosis without stenosis.  - Mitral valve: Calcified annulus. Moderately thickened, mildly    calcified leaflets . There was mild regurgitation.  - Left atrium: The atrium was moderately dilated.  - Right ventricle: The cavity size was mildly dilated. Wall    thickness was normal.  - Right atrium: The atrium  was moderately dilated.  - Atrial septum: No defect or patent foramen ovale was identified.  - Tricuspid valve: There was moderate regurgitation.   EKG:  EKG is  ordered today.  The ekg ordered today demonstrates atrial fibrillation with rapid ventricular response, rate 101 bpm  Recent Labs: 07/16/2021: BUN 15; Creatinine, Ser 0.82; Hemoglobin 11.1; Platelets 273; Potassium 3.9; Sodium 135  Recent Lipid Panel No results found for: "CHOL", "TRIG", "HDL", "CHOLHDL", "VLDL", "LDLCALC", "LDLDIRECT"  Risk Assessment/Calculations:   CHA2DS2-VASc Score = 5   This  indicates a 7.2% annual risk of stroke. The patient's score is based upon: CHF History: 1 HTN History: 0 Diabetes History: 0 Stroke History: 0 Vascular Disease History: 1 Age Score: 2 Gender Score: 1     Home Medications   Current Meds  Medication Sig   atorvastatin (LIPITOR) 10 MG tablet Take 1 tablet (10 mg total) by mouth daily.   cyanocobalamin 1000 MCG tablet Take 1,000 mcg by mouth daily.   fexofenadine (ALLEGRA ALLERGY) 60 MG tablet Take 1 tablet (60 mg total) by mouth daily as needed for allergies or rhinitis.   gabapentin (NEURONTIN) 400 MG capsule Take 1 capsule (400 mg total) by mouth daily.   hydrocortisone 1 % ointment Apply 1 Application topically 2 (two) times daily.   magic mouthwash w/lidocaine SOLN Take 5 mLs by mouth 4 (four) times daily as needed for mouth pain.   meclizine (ANTIVERT) 12.5 MG tablet Take 1 tablet (12.5 mg total) by mouth 3 (three) times daily as needed for dizziness.   metoprolol tartrate (LOPRESSOR) 50 MG tablet TAKE 1 TABLET TWICE A DAY   Multiple Vitamin (MULTIVITAMIN) capsule Take 1 capsule by mouth daily.   mupirocin cream (BACTROBAN) 2 % Apply fingertip amount to wound area daily.   omeprazole (PRILOSEC) 20 MG capsule 1 capsule 30 minutes before morning meal   potassium chloride SA (KLOR-CON M) 20 MEQ tablet TAKE 1 TABLET BY MOUTH DAILY   prednisoLONE 5 MG TABS tablet TAKE 1 TABLET DAILY   sertraline (ZOLOFT) 100 MG tablet TAKE 1 TABLET DAILY   spironolactone (ALDACTONE) 25 MG tablet Take 0.5 tablets (12.5 mg total) by mouth daily.   torsemide (DEMADEX) 20 MG tablet TAKE 2 TABLETS DAILY   traMADol (ULTRAM) 50 MG tablet TAKE 2 TABLETS TWICE A DAY AS NEEDED   Vitamin D, Cholecalciferol, 25 MCG (1000 UT) TABS Take 1,000 Units by mouth daily.   warfarin (COUMADIN) 5 MG tablet TAKE ONE-HALF (1/2) TABLET (2.5 MG) ON WEDNESDAY AND SATURDAY. 1 TABLET (5 MG) ALL OTHER DAYS   [DISCONTINUED] potassium chloride SA (KLOR-CON M) 20 MEQ tablet TAKE 1  TABLET DAILY (NEED TO MAKE APPOINTMENT WITH PROVIDER FOR FURTHER REFILLS, FIRST ATTEMPT)     Review of Systems      All other systems reviewed and are otherwise negative except as noted above.  Physical Exam    VS:  BP 100/68   Pulse (!) 101   Ht 5\' 3"  (1.6 m)   Wt 100 lb 6.4 oz (45.5 kg)   SpO2 97%   BMI 17.79 kg/m  , BMI Body mass index is 17.79 kg/m.  Wt Readings from Last 3 Encounters:  06/03/22 100 lb 6.4 oz (45.5 kg)  08/31/21 97 lb (44 kg)  07/16/21 90 lb (40.8 kg)     GEN: Well nourished, well developed, in no acute distress. HEENT: normal. Neck: Supple, no JVD, carotid bruits, or masses. Cardiac: irregularly irregular, no murmurs, rubs, or gallops. No clubbing, cyanosis, edema.  Radials/PT 2+ and equal bilaterally.  Respiratory:  Respirations regular and unlabored, clear to auscultation bilaterally. GI: Soft, nontender, nondistended. MS: No deformity or atrophy. Skin: Warm and dry, no rash. Neuro:  Strength and sensation are intact. Psych: Normal affect.  Assessment & Plan    Congestive heart failure -Euvolemic on exam today -Continue GDMT: Lipitor 10 mg daily, Lopressor 50 mg twice daily, torsemide 40 mg daily, potassium 20 mill equivalents daily, spironolactone 12.5 mg daily, Coumadin as dosed by the Coumadin clinic  Chronic atrial fibrillation -A-fib with RVR, rate 101 bpm -Patient is asymptomatic -Unable to increase metoprolol due to low blood pressure and previously not tolerating 100 mg twice daily -Continue current medications for now -Anticoagulated on Coumadin  Long-term use of anticoagulants -no bleeding other than easy bruising  Stenosis of both subclavian arteries -asymptomatic         Disposition: Follow up 1 year with Lesleigh Noe, MD or APP.  Signed, Sharlene Dory, PA-C 06/03/2022, 1:01 PM [facility]

## 2022-06-03 ENCOUNTER — Ambulatory Visit: Payer: Medicare Other | Attending: Physician Assistant | Admitting: Physician Assistant

## 2022-06-03 ENCOUNTER — Encounter: Payer: Self-pay | Admitting: Physician Assistant

## 2022-06-03 VITALS — BP 100/68 | HR 101 | Ht 63.0 in | Wt 100.4 lb

## 2022-06-03 DIAGNOSIS — Z7901 Long term (current) use of anticoagulants: Secondary | ICD-10-CM | POA: Insufficient documentation

## 2022-06-03 DIAGNOSIS — I509 Heart failure, unspecified: Secondary | ICD-10-CM | POA: Insufficient documentation

## 2022-06-03 DIAGNOSIS — I482 Chronic atrial fibrillation, unspecified: Secondary | ICD-10-CM | POA: Diagnosis not present

## 2022-06-03 DIAGNOSIS — I771 Stricture of artery: Secondary | ICD-10-CM | POA: Diagnosis not present

## 2022-06-03 MED ORDER — POTASSIUM CHLORIDE CRYS ER 20 MEQ PO TBCR: EXTENDED_RELEASE_TABLET | ORAL | 3 refills | Status: DC

## 2022-06-03 NOTE — Patient Instructions (Signed)
Medication Instructions:  Your physician recommends that you continue on your current medications as directed. Please refer to the Current Medication list given to you today.  *If you need a refill on your cardiac medications before your next appointment, please call your pharmacy*   Lab Work: None If you have labs (blood work) drawn today and your tests are completely normal, you will receive your results only by: MyChart Message (if you have MyChart) OR A paper copy in the mail If you have any lab test that is abnormal or we need to change your treatment, we will call you to review the results.   Follow-Up: At [facility], you and your health needs are our priority.  As part of our continuing mission to provide you with exceptional heart care, we have created designated Provider Care Teams.  These Care Teams include your primary Cardiologist (physician) and Advanced Practice Providers (APPs -  Physician Assistants and Nurse Practitioners) who all work together to provide you with the care you need, when you need it.   Your next appointment:   1 year(s)  The format for your next appointment:   In Person   Important Information About Sugar       

## 2022-06-16 DIAGNOSIS — I4821 Permanent atrial fibrillation: Secondary | ICD-10-CM | POA: Diagnosis not present

## 2022-06-16 DIAGNOSIS — Z7901 Long term (current) use of anticoagulants: Secondary | ICD-10-CM | POA: Diagnosis not present

## 2022-06-19 LAB — POCT INR: INR: 2.4 (ref 2.0–3.0)

## 2022-06-20 ENCOUNTER — Ambulatory Visit (INDEPENDENT_AMBULATORY_CARE_PROVIDER_SITE_OTHER): Payer: Medicare Other

## 2022-06-20 DIAGNOSIS — Z7901 Long term (current) use of anticoagulants: Secondary | ICD-10-CM | POA: Diagnosis not present

## 2022-06-20 NOTE — Patient Instructions (Signed)
Continue 1 tablet daily except take 1/2 tablet on Mondays, Wednesdays, and Fridays. Recheck in 2 weeks, on 11/22.

## 2022-06-20 NOTE — Progress Notes (Signed)
Patient checked INR with home [employer] analyzer and submitted results via portal on 11/8. INR is 2.4.  Continue 1 tablet daily except take 1/2 tablet on Mondays, Wednesdays, and Fridays. Recheck in 2 weeks, on 11/22.  Called pt's home and spoke to pt's daughter, [name]. Advised her of above dosing instructions and to contact the office if there are any changes. Verbalized understanding.

## 2022-07-03 ENCOUNTER — Telehealth: Payer: Self-pay

## 2022-07-03 NOTE — Telephone Encounter (Signed)
Pt was scheduled for a home INR check this week. Called and spoke to pt's daughter, [name]. Informed her that the anticoagulation clinic will be closed from 11/23 - 11/27. Any home INR results that are submitted during that time will not be read until 11/28. Verbalized understanding.

## 2022-07-12 LAB — POCT INR: INR: 3 (ref 2.0–3.0)

## 2022-07-15 ENCOUNTER — Ambulatory Visit (INDEPENDENT_AMBULATORY_CARE_PROVIDER_SITE_OTHER): Payer: Medicare Other

## 2022-07-15 DIAGNOSIS — Z7901 Long term (current) use of anticoagulants: Secondary | ICD-10-CM | POA: Diagnosis not present

## 2022-07-15 NOTE — Progress Notes (Signed)
Patient checked INR with home [employer] analyzer and submitted results via portal on Friday, 12/1. INR is 3.0.  Continue 1 tablet daily except take 1/2 tablet on Mondays, Wednesdays, and Fridays. Recheck in 2 weeks, on 12/15.  Called pt's home and LVM with the above instructions, and advised to call back if there are any questions.

## 2022-07-15 NOTE — Patient Instructions (Signed)
Continue 1 tablet daily except take 1/2 tablet on Mondays, Wednesdays, and Fridays. Recheck in 2 weeks, on 12/15.

## 2022-07-18 ENCOUNTER — Telehealth: Payer: Self-pay | Admitting: Internal Medicine

## 2022-07-18 LAB — POCT INR: INR: 3.9 — AB (ref 2.0–3.0)

## 2022-07-18 NOTE — Telephone Encounter (Signed)
On call nurse called today - patient checked her INR today and it was 3.9.    She takes warfarin 2.5 mg M,W, F and 5 mg T,Th, Sa and Su    Advised to hold warfarin tonight and someone will reach out to her tomorrow with further instructions.    Sherrie George can you call her tomorrow with further instructions.

## 2022-07-19 ENCOUNTER — Ambulatory Visit (INDEPENDENT_AMBULATORY_CARE_PROVIDER_SITE_OTHER): Payer: Medicare Other

## 2022-07-19 DIAGNOSIS — Z7901 Long term (current) use of anticoagulants: Secondary | ICD-10-CM | POA: Diagnosis not present

## 2022-07-19 NOTE — Patient Instructions (Addendum)
Pre visit review using our clinic review tool, if applicable. No additional management support is needed unless otherwise documented below in the visit note.  Continue 1/2 tablet daily except take 1 tablet on Tuesdays, Thursdays and Saturdays. Recheck in 2 weeks, or before 07/31/22.

## 2022-07-19 NOTE — Progress Notes (Signed)
Patient checked INR with home [employer] analyzer and submitted results via portal on 12/7, after hours. INR was 3.9 and after hours physician advised pt hold dose last night and coumadin clinic to f/u with pt today.  Continue 1/2 tablet daily except take 1 tablet on Tuesdays, Thursdays and Saturdays. Recheck in 2 weeks, or before 07/31/22. Contacted pt who reports she has had a serious elbow injury and was prescribed gabapentin. Denies any other changes. Not taking any ibuprofen. Pt uses Tylenol. Pt read back dosing instructions and verbalized understanding.  Contacted pt's daughter, [name], and advised of change in dosing and to retest on 12/20. [name] verbalized understanding.

## 2022-07-19 NOTE — Telephone Encounter (Signed)
Pt has been contacted and dosing adjustment has been made. Documented in anticoagulation encounter under today's date.

## 2022-07-31 LAB — POCT INR: INR: 2.1 (ref 2.0–3.0)

## 2022-08-01 ENCOUNTER — Ambulatory Visit (INDEPENDENT_AMBULATORY_CARE_PROVIDER_SITE_OTHER): Payer: Medicare Other

## 2022-08-01 DIAGNOSIS — I482 Chronic atrial fibrillation, unspecified: Secondary | ICD-10-CM

## 2022-08-01 DIAGNOSIS — Z7901 Long term (current) use of anticoagulants: Secondary | ICD-10-CM | POA: Diagnosis not present

## 2022-08-01 NOTE — Progress Notes (Signed)
Patient checked INR with home [employer] analyzer and submitted results via portal on 12/20, after hours. INR was 2.1  Continue 1/2 tablet daily except take 1 tablet on Tuesdays, Thursdays and Saturdays. Recheck in 2 weeks, on 08/14/22. Pt read back dosing instructions and verbalized understanding.  Contacted pt's daughter, [name], and advised of change in dosing and to retest on 1/3. [name] verbalized understanding.

## 2022-08-01 NOTE — Patient Instructions (Addendum)
Pre visit review using our clinic review tool, if applicable. No additional management support is needed unless otherwise documented below in the visit note.  Continue 1/2 tablet daily except take 1 tablet on Tuesdays, Thursdays and Saturdays. Recheck in 2 weeks, on 08/14/22.

## 2022-08-12 DIAGNOSIS — Z7901 Long term (current) use of anticoagulants: Secondary | ICD-10-CM | POA: Diagnosis not present

## 2022-08-12 DIAGNOSIS — I4821 Permanent atrial fibrillation: Secondary | ICD-10-CM | POA: Diagnosis not present

## 2022-08-12 LAB — POCT INR: INR: 3.4 — AB (ref 2.0–3.0)

## 2022-08-13 ENCOUNTER — Ambulatory Visit (INDEPENDENT_AMBULATORY_CARE_PROVIDER_SITE_OTHER): Payer: Medicare Other

## 2022-08-13 ENCOUNTER — Telehealth: Payer: Self-pay | Admitting: Family Medicine

## 2022-08-13 DIAGNOSIS — Z7901 Long term (current) use of anticoagulants: Secondary | ICD-10-CM

## 2022-08-13 NOTE — Telephone Encounter (Signed)
PT calls today returning a call to Randall An. I informed PT that she was out for the day but would return her call when possible.   CB: [phone]

## 2022-08-13 NOTE — Progress Notes (Signed)
Patient checked INR with home [employer] analyzer and submitted results via portal, yesterday, 08/12/22, after hours. INR was 3.4  Hold dose today and then change weekly dose to take 1/2 tablet daily except take 1 tablet on Tuesdays and Saturdays. Recheck in 2 weeks, on 08/27/22. LVM for pt to call if any questions.  Contacted pt's daughter, Thayer Headings, and advised of change in dosing and to retest on 1/16. Thayer Headings verbalized understanding.

## 2022-08-13 NOTE — Patient Instructions (Addendum)
Pre visit review using our clinic review tool, if applicable. No additional management support is needed unless otherwise documented below in the visit note.  Hold dose today and then change weekly dose to take 1/2 tablet daily except take 1 tablet on Tuesdays and Saturdays. Recheck in 2 weeks, on 08/27/22.

## 2022-08-14 NOTE — Telephone Encounter (Signed)
LVM

## 2022-08-21 ENCOUNTER — Other Ambulatory Visit: Payer: Self-pay | Admitting: Family Medicine

## 2022-08-21 DIAGNOSIS — G629 Polyneuropathy, unspecified: Secondary | ICD-10-CM

## 2022-08-26 ENCOUNTER — Ambulatory Visit (INDEPENDENT_AMBULATORY_CARE_PROVIDER_SITE_OTHER): Payer: Medicare Other

## 2022-08-26 DIAGNOSIS — Z7901 Long term (current) use of anticoagulants: Secondary | ICD-10-CM

## 2022-08-26 LAB — POCT INR: INR: 2 (ref 2.0–3.0)

## 2022-08-26 NOTE — Progress Notes (Addendum)
Patient's daughter, Thayer Headings, helps test INR with home [employer] analyzer and submitted results via portal, yesterday, 08/25/22, after hours. INR was 2.0 Continue 1/2 tablet daily except take 1 tablet on Tuesdays and Saturdays. Recheck in 2 weeks, on 09/09/22. LVM for pt to call if any questions.  LVM for pt's daughter, Thayer Headings, and advised of change in dosing and to retest on 1/29. Thayer Headings verbalized understanding.

## 2022-08-26 NOTE — Patient Instructions (Addendum)
Pre visit review using our clinic review tool, if applicable. No additional management support is needed unless otherwise documented below in the visit note.  Continue 1/2 tablet daily except take 1 tablet on Tuesdays and Saturdays. Recheck in 2 weeks, on 09/09/22.

## 2022-08-28 ENCOUNTER — Telehealth: Payer: Self-pay | Admitting: Family Medicine

## 2022-08-28 NOTE — Telephone Encounter (Signed)
Prescription Request  08/28/2022  Is this a "Controlled Substance" medicine? Yes traMADol (ULTRAM) 50 MG tablet  LOV: 04/25/2022  What is the name of the medication or equipment? prednisoLONE 5 MG TABS tablet & traMADol (ULTRAM) 50 MG tablet  Have you contacted your pharmacy to request a refill? No   Which pharmacy would you like this sent to?  Both Rx to [place], [place] Phone: [phone]  Fax: [phone]     However need 2 weeks supply of prednisoLONE 5 MG TABS tablet  sent to [place], [address] Phone: [phone]  Fax: [phone]     Patient notified that their request is being sent to the clinical staff for review and that they should receive a response within 3 business days.   Please advise at [facility] [phone]

## 2022-09-03 ENCOUNTER — Telehealth (INDEPENDENT_AMBULATORY_CARE_PROVIDER_SITE_OTHER): Payer: Medicare Other | Admitting: Family Medicine

## 2022-09-03 DIAGNOSIS — Z0000 Encounter for general adult medical examination without abnormal findings: Secondary | ICD-10-CM

## 2022-09-03 NOTE — Progress Notes (Signed)
PATIENT CHECK-IN and HEALTH RISK ASSESSMENT QUESTIONNAIRE:  -completed by phone/video for upcoming Medicare Preventive Visit  Pre-Visit Check-in: 1)Vitals (height, wt, BP, etc) - record in vitals section for visit on day of visit 2)Review and Update Medications, Allergies PMH, Surgeries, Social history in Epic 3)Hospitalizations in the last year with date/reason? no  4)Review and Update Care Team (patient's specialists) in Epic 5) Complete PHQ9 in Epic  6) Complete Fall Screening in Epic 7)Review all Health Maintenance Due and order under PCP if not done.  8)Medicare Wellness Questionnaire: Answer theses question about your habits: Do you drink alcohol? no If yes, how many drinks do you have a day? Have you ever smoked?yes Quit date if applicable? 1962  How many packs a day do/did you smoke? 1 pk a week Do you use smokeless tobacco?no Do you use an illicit drugs?no Do you exercises? No IF so, what type and how many days/minutes per week? But she does try to get up and around as much as possible. Has difficulty ambulating - has had physical therapy but did not find it helpful. Nurses at the ALF have given her exercises to do. She is doing some chair exercises.  Are you sexually active? No Number of partners? Lives in ALF and meals are provided, she has been eating several meals per day.  Typical breakfast: muffins Typical lunch: meat, potatoes  Typical dinner: sandwiches, soup Typical snacks: chocolate chips cookies, apple sauce, fruit cups  Beverages: water, lemonade, apple juice, cranberry juice  Answer theses question about you: Can you perform most household chores?no Do you find it hard to follow a conversation in a noisy room?no Do you often ask people to speak up or repeat themselves?yes Do you feel that you have a problem with memory?a little bit, short-term Do you balance your checkbook and or bank acounts?no, daughter does it Do you feel safe at home?living at [facility] living, feels safe Last dentist visit?2 years ago  Do you need assistance with any of the following: Please note if so   Driving? yes  Feeding yourself?no  Getting from bed to chair?no  Getting to the [place] or showering?no  Dressing yourself?no  Managing money? Yes, daughter  Climbing a flight of stairs no  Preparing meals? yes  Do you have Advanced Directives in place (Living Will, Healthcare Power or Attorney)? yes   Last eye Exam and location?March of 2023, with [place]. Dr. Barbette Hair.    Do you currently use prescribed or non-prescribed narcotic or opioid pain medications?yes, Tramadol  Do you have a history or close family history of breast, ovarian, tubal or peritoneal cancer or a family member with BRCA (breast cancer susceptibility 1 and 2) gene mutations?  Nurse/Assistant Credentials/time stamp: Karpuih M./CMA/3:52pm   ----------------------------------------------------------------------------------------------------------------------------------------------------------------------------------------------------------------------   MEDICARE ANNUAL PREVENTIVE VISIT WITH PROVIDER: (Welcome to Medicare, initial annual wellness or annual wellness exam)  Virtual Visit via Phone Note  I connected with [place]  on 09/03/22 by phone  and verified that I am speaking with the correct person using two identifiers.  Location patient: home Location provider:work or home office Persons participating in the virtual visit: patient, provider  Concerns and/or follow up today: none, reports is doing ok for 89.    See HM section in Epic for other details of completed HM.    ROS: negative for report of fevers, unintentional weight loss, vision changes, vision loss, hearing loss or change, chest pain, sob, hemoptysis, melena, hematochezia, hematuria, genital discharge  or lesions, falls, bleeding or bruising, loc, thoughts of suicide or  self harm, memory loss  Patient-completed extensive health risk assessment - reviewed and discussed with the patient: See Health Risk Assessment completed with patient prior to the visit either above or in recent phone note. This was reviewed in detailed with the patient today and appropriate recommendations, orders and referrals were placed as needed per Summary below and patient instructions.   Review of Medical History: -PMH, PSH, Family History and current specialty and care providers reviewed and updated and listed below   Patient Care Team: [name], MD as PCP - General (Family Medicine) Lyn Records, MD as PCP - Cardiology (Cardiology)   Past Medical History:  Diagnosis Date   Atrial fibrillation (HCC)    Hernia of abdominal cavity    Kyphosis     Past Surgical History:  Procedure Laterality Date   ABDOMINAL HYSTERECTOMY     CATARACT EXTRACTION, BILATERAL     TONSILLECTOMY      Social History   Socioeconomic History   Marital status: Widowed    Spouse name: Not on file   Number of children: Not on file   Years of education: Not on file   Highest education level: Not on file  Occupational History   Not on file  Tobacco Use   Smoking status: Former   Smokeless tobacco: Never  Vaping Use   Vaping Use: Never used  Substance and Sexual Activity   Alcohol use: No   Drug use: No   Sexual activity: Not on file  Other Topics Concern   Not on file  Social History Narrative   Not on file   Social Determinants of Health   Financial Resource Strain: Low Risk  (08/31/2021)   Overall Financial Resource Strain (CARDIA)    Difficulty of Paying Living Expenses: Not hard at all  Food Insecurity: No Food Insecurity (08/31/2021)   Hunger Vital Sign    Worried About Running Out of Food in the Last Year: Never true    Ran Out of Food in the Last Year: Never true  Transportation Needs: No Transportation Needs (08/31/2021)   PRAPARE - Scientist, research (physical sciences) (Medical): No    Lack of Transportation (Non-Medical): No  Physical Activity: Inactive (08/31/2021)   Exercise Vital Sign    Days of Exercise per Week: 0 days    Minutes of Exercise per Session: 0 min  Stress: No Stress Concern Present (08/31/2021)   [employer] of Occupational Health - Occupational Stress Questionnaire    Feeling of Stress : Not at all  Social Connections: Socially Isolated (08/31/2021)   Social Connection and Isolation Panel [NHANES]    Frequency of Communication with Friends and Family: More than three times a week    Frequency of Social Gatherings with Friends and Family: More than three times a week    Attends Religious Services: Never    Database administrator or Organizations: No    Attends Banker Meetings: Never    Marital Status: Widowed  Intimate Partner Violence: Not At Risk (08/31/2021)   Humiliation, Afraid, Rape, and Kick questionnaire    Fear of Current or Ex-Partner: No    Emotionally Abused: No    Physically Abused: No    Sexually Abused: No    Family History  Problem Relation Age of Onset   Healthy Mother    Heart failure Father     Current Outpatient Medications on File  Prior to Visit  Medication Sig Dispense Refill   acetaminophen (TYLENOL) 500 MG tablet Take 500 mg by mouth every 6 (six) hours as needed. WITH TRAMADOL     atorvastatin (LIPITOR) 10 MG tablet Take 1 tablet (10 mg total) by mouth daily. 90 tablet 3   cyanocobalamin 1000 MCG tablet Take 1,000 mcg by mouth daily.     fexofenadine (ALLEGRA ALLERGY) 60 MG tablet Take 1 tablet (60 mg total) by mouth daily as needed for allergies or rhinitis. 90 tablet 3   gabapentin (NEURONTIN) 400 MG capsule TAKE 1 CAPSULE DAILY 90 capsule 3   metoprolol tartrate (LOPRESSOR) 50 MG tablet TAKE 1 TABLET TWICE A DAY 180 tablet 3   Multiple Vitamin (MULTIVITAMIN) capsule Take 1 capsule by mouth daily.     omeprazole (PRILOSEC) 20 MG capsule 1 capsule 30 minutes before  morning meal     potassium chloride SA (KLOR-CON M) 20 MEQ tablet TAKE 1 TABLET BY MOUTH DAILY 90 tablet 3   prednisoLONE 5 MG TABS tablet TAKE 1 TABLET DAILY 90 tablet 3   sertraline (ZOLOFT) 100 MG tablet TAKE 1 TABLET DAILY 90 tablet 3   spironolactone (ALDACTONE) 25 MG tablet Take 0.5 tablets (12.5 mg total) by mouth daily. 90 tablet 3   torsemide (DEMADEX) 20 MG tablet TAKE 2 TABLETS DAILY 180 tablet 3   traMADol (ULTRAM) 50 MG tablet TAKE 2 TABLETS TWICE A DAY AS NEEDED 120 tablet 5   Vitamin D, Cholecalciferol, 25 MCG (1000 UT) TABS Take 1,000 Units by mouth daily.     warfarin (COUMADIN) 5 MG tablet TAKE ONE-HALF (1/2) TABLET (2.5 MG) ON WEDNESDAY AND SATURDAY. 1 TABLET (5 MG) ALL OTHER DAYS (Patient taking differently: 5 mg. TAKE ONE-HALF (1/2) TABLET (2.5 MG) ON SUNDAY, MONDAY, WEDNESDAY, THURSDAY AND FRIDAY. 1 TABLET (5 MG) ON TUESDAY AND SATURDAY.) 100 tablet 3   hydrocortisone 1 % ointment Apply 1 Application topically 2 (two) times daily. (Patient not taking: Reported on 09/03/2022) 30 g 0   magic mouthwash w/lidocaine SOLN Take 5 mLs by mouth 4 (four) times daily as needed for mouth pain. (Patient not taking: Reported on 09/03/2022) 120 mL 0   meclizine (ANTIVERT) 12.5 MG tablet Take 1 tablet (12.5 mg total) by mouth 3 (three) times daily as needed for dizziness. (Patient not taking: Reported on 09/03/2022) 30 tablet 0   mupirocin cream (BACTROBAN) 2 % Apply fingertip amount to wound area daily. (Patient not taking: Reported on 09/03/2022) 15 g 0   No current facility-administered medications on file prior to visit.    Allergies  Allergen Reactions   Sulfa Antibiotics Anaphylaxis   Tape Other (See Comments)    BRUISES VERY EASILY!!!!   Codeine Nausea And Vomiting   Hydroxychloroquine     Other reaction(s): GI upset   Shellfish-Derived Products Other (See Comments)    Daughter was unsure of the reaction- "happened years ago" and patient avoids foods that contain shellfish   Latex  Rash   Neosporin Original [Bacitracin-Neomycin-Polymyxin] Rash   Polysporin [Bacitracin-Polymyxin B] Rash       Physical Exam There were no vitals filed for this visit. Estimated body mass index is 17.79 kg/m as calculated from the following:   Height as of 06/03/22: 5\' 3"  (1.6 m).   Weight as of 06/03/22: 100 lb 6.4 oz (45.5 kg).  EKG (optional): deferred due to virtual visit  GENERAL: alert, oriented, no audible sounds of distress,   PSYCH/NEURO: pleasant and cooperative, no obvious depression or anxiety,  speech and thought processing grossly intact, Cognitive function grossly intact  Flowsheet Row Video Visit from 09/03/2022 in [place] [facility]  PHQ-9 Total Score 1           09/03/2022    3:40 PM 04/25/2022    4:59 PM 08/31/2021    2:42 PM 07/11/2021    1:45 PM 08/30/2020    4:14 PM  Depression screen PHQ 2/9  Decreased Interest 0 0 0 1 0  Down, Depressed, Hopeless 0 0 0 0 0  PHQ - 2 Score 0 0 0 1 0  Altered sleeping 0 0 0 1 0  Tired, decreased energy 1 1 0 3 0  Change in appetite 0 0 0 1 0  Feeling bad or failure about yourself  0 0 0 0 0  Trouble concentrating 0 0 0 1 0  Moving slowly or fidgety/restless 0 0 0 0 0  Suicidal thoughts 0 0 0 0 0  PHQ-9 Score 1 1 0 7 0  Difficult doing work/chores  Not difficult at all   Not difficult at all       07/11/2021    1:44 PM 07/16/2021   12:23 PM 08/31/2021    2:46 PM 04/25/2022    4:59 PM 09/03/2022    3:38 PM  Fall Risk  Falls in the past year? 1  1 1 1   Was there an injury with Fall? 0  1 1 1   Was there an injury with Fall? - Comments   Patient fell injured back. Followed by Urgent care    Fall Risk Category Calculator 2  3 2 3   Fall Risk Category (Retired) Moderate  High Moderate   (RETIRED) Patient Fall Risk Level  Low fall risk Moderate fall risk Moderate fall risk   Patient at Risk for Falls Due to    Other (Comment) Other (Comment)  Fall risk Follow up    Falls evaluation completed  Falls evaluation completed     SUMMARY AND PLAN:  Medicare annual wellness visit, subsequent     Discussed applicable health maintenance/preventive health measures and advised and referred or ordered per patient preferences:  Health Maintenance  Topic Date Due   COVID-19 Vaccine (1) Never done   DTaP/Tdap/Td (1 - Tdap) Never done   Zoster Vaccines- Shingrix (1 of 2) Never done   DEXA SCAN  Discussed, declined   Pneumonia Vaccine 60+ Years old (2 - PCV) 08/30/2021   INFLUENZA VACCINE  Never done   Medicare Annual Wellness (AWV)  08/31/2022   HPV VACCINES  Aged [employer] and counseling on the following was provided based on the above review of health and a plan/checklist for the patient, along with additional information discussed, was provided for the patient in the patient instructions :  -Advised on importance of and resources for completing advanced directives -Provided counseling and plan for increased risk of falling if applicable per above screening. She declines further PT or balance exercises. Reports nursing in ALF has provided.  -Provided counseling and plan for function difficulties/ difficulties with ADLs if applicable per above screening. -Advised and counseled on maintaining healthy weight and healthy lifestyle - including the importance of a health diet, regular physical activity, social connections and stress management. -Advised yearly dental visits at minimum and regular eye exams   Follow up: see patient instructions     Patient Instructions  I really enjoyed getting to talk with you today! I am available on Tuesdays and Thursdays  for virtual visits if you have any questions or concerns, or if I can be of any further assistance.   CHECKLIST FROM ANNUAL WELLNESS VISIT:  -Follow up (please call to schedule if not scheduled after visit):  -Inperson visit with your Primary Doctor office: -yearly for annual wellness visit with primary care office  Here  is a list of your preventive care/health maintenance measures and the plan for each if any are due:  For most vaccines, you can get them at the doctor office or the pharmacy. The covid vaccine is not available at the office.  Health Maintenance  Topic Date Due   COVID-19 Vaccine (1) Never done   DTaP/Tdap/Td (1 - Tdap) Never done   Zoster Vaccines- Shingrix (1 of 2) Never done   DEXA SCAN  Never done   Pneumonia Vaccine 52+ Years old (2 - PCV) 08/30/2021   INFLUENZA VACCINE  Never done   Medicare Annual Wellness (AWV)  09/04/2023   HPV VACCINES  Aged Out    -See a dentist at least yearly  -Get your eyes checked and then per your eye specialist's recommendations  -Other issues addressed today:   -I have included below further information regarding a healthy whole foods based diet, physical activity guidelines for adults, stress management and opportunities for social connections. I hope you find this information useful.   -----------------------------------------------------------------------------------------------------------------------------------------------------------------------------------------------------------------------------------------------------------  NUTRITION: -eat real food: lots of colorful vegetables (half the plate) and fruits -5-7 servings of vegetables and fruits per day (fresh or steamed is best), exp. 2 servings of vegetables with lunch and dinner and 2 servings of fruit per day. Berries and greens such as kale and collards are great choices.  -consume on a regular basis: whole grains (make sure first ingredient on label contains the word "whole"), fresh fruits, fish, nuts, seeds, healthy oils (such as olive oil, avocado oil, grape seed oil) -may eat small amounts of dairy and lean meat on occasion, but avoid processed meats such as ham, bacon, lunch meat, etc. -drink water -try to avoid fast food and pre-packaged foods, processed meat -most experts advise  limiting sodium to < 2300mg  per day, should limit further is any chronic conditions such as high blood pressure, heart disease, diabetes, etc. The American Heart Association advised that < 1500mg  is is ideal -try to avoid foods that contain any ingredients with names you do not recognize  -try to avoid sugar/sweets (except for the natural sugar that occurs in fresh fruit) -try to avoid sweet drinks -try to avoid white rice, white bread, pasta (unless whole grain), white or yellow potatoes  EXERCISE GUIDELINES FOR ADULTS: -if you wish to increase your physical activity, do so gradually and with the approval of your doctor -STOP and seek medical care immediately if you have any chest pain, chest discomfort or trouble breathing when starting or increasing exercise  -move and stretch your body, legs, feet and arms when sitting for long periods -Physical activity guidelines for optimal health in adults: -least 150 minutes per week of aerobic exercise (can talk, but not sing) once approved by your doctor, 20-30 minutes of sustained activity or two 10 minute episodes of sustained activity every day.  -resistance training at least 2 days per week if approved by your doctor -balance exercises 3+ days per week:   Stand somewhere where you have something sturdy to hold onto if you lose balance.    1) lift up on toes, start with 5x per day and work up to 20x  2) stand and lift on leg straight out to the side so that foot is a few inches of the floor, start with 5x each side and work up to 20x each side   3) stand on one foot, start with 5 seconds each side and work up to 20 seconds on each side  If you need ideas or help with getting more active:  -Silver sneakers https://tools.silversneakers.com  -Walk with a Doc: http://www.duncan-williams.com/  -try to include resistance (weight lifting/strength building) and balance exercises twice per week: or the following link for  ideas: http://castillo-powell.com/  BuyDucts.dk  STRESS MANAGEMENT: -can try meditating, or just sitting quietly with deep breathing while intentionally relaxing all parts of your body for 5 minutes daily -if you need further help with stress, anxiety or depression please follow up with your primary doctor or contact the wonderful folks at [facility]: [phone]  SOCIAL CONNECTIONS: -options in [place] if you wish to engage in more social and exercise related activities:  -Silver sneakers https://tools.silversneakers.com  -Walk with a Doc: http://www.duncan-williams.com/  -Check out the [facility] Active Adults 50+ section on the [place] of [employer] (hiking clubs, book clubs, cards and games, chess, exercise classes, aquatic classes and much more) - see the website for details: https://www.[place].gov/departments/parks-recreation/active-adults50  -YouTube has lots of exercise videos for different ages and abilities as well  -[facility] (a variety of indoor and outdoor inperson activities for adults). [phone]. [address]  -Virtual Online Classes (a variety of topics): see seniorplanet.org or call [phone]  -consider volunteering at a school, hospice center, church, senior center or elsewhere           Terressa Koyanagi, DO

## 2022-09-03 NOTE — Patient Instructions (Addendum)
I really enjoyed getting to talk with you today! I am available on Tuesdays and Thursdays for virtual visits if you have any questions or concerns, or if I can be of any further assistance.   CHECKLIST FROM ANNUAL WELLNESS VISIT:  -Follow up (please call to schedule if not scheduled after visit):  -Inperson visit with your Primary Doctor office: -yearly for annual wellness visit with primary care office  Here is a list of your preventive care/health maintenance measures and the plan for each if any are due:  For most vaccines, you can get them at the doctor office or the pharmacy. The covid vaccine is not available at the office.  Health Maintenance  Topic Date Due   COVID-19 Vaccine (1) Never done   DTaP/Tdap/Td (1 - Tdap) Never done   Zoster Vaccines- Shingrix (1 of 2) Never done   DEXA SCAN  Never done   Pneumonia Vaccine 70+ Years old (2 - PCV) 08/30/2021   INFLUENZA VACCINE  Never done   Medicare Annual Wellness (AWV)  09/04/2023   HPV VACCINES  Aged Out    -See a dentist at least yearly  -Get your eyes checked and then per your eye specialist's recommendations  -Other issues addressed today:   -I have included below further information regarding a healthy whole foods based diet, physical activity guidelines for adults, stress management and opportunities for social connections. I hope you find this information useful.   -----------------------------------------------------------------------------------------------------------------------------------------------------------------------------------------------------------------------------------------------------------  NUTRITION: -eat real food: lots of colorful vegetables (half the plate) and fruits -5-7 servings of vegetables and fruits per day (fresh or steamed is best), exp. 2 servings of vegetables with lunch and dinner and 2 servings of fruit per day. Berries and greens such as kale and collards are great choices.   -consume on a regular basis: whole grains (make sure first ingredient on label contains the word "whole"), fresh fruits, fish, nuts, seeds, healthy oils (such as olive oil, avocado oil, grape seed oil) -may eat small amounts of dairy and lean meat on occasion, but avoid processed meats such as ham, bacon, lunch meat, etc. -drink water -try to avoid fast food and pre-packaged foods, processed meat -most experts advise limiting sodium to < 2300mg  per day, should limit further is any chronic conditions such as high blood pressure, heart disease, diabetes, etc. The American Heart Association advised that < 1500mg  is is ideal -try to avoid foods that contain any ingredients with names you do not recognize  -try to avoid sugar/sweets (except for the natural sugar that occurs in fresh fruit) -try to avoid sweet drinks -try to avoid white rice, white bread, pasta (unless whole grain), white or yellow potatoes  EXERCISE GUIDELINES FOR ADULTS: -if you wish to increase your physical activity, do so gradually and with the approval of your doctor -STOP and seek medical care immediately if you have any chest pain, chest discomfort or trouble breathing when starting or increasing exercise  -move and stretch your body, legs, feet and arms when sitting for long periods -Physical activity guidelines for optimal health in adults: -least 150 minutes per week of aerobic exercise (can talk, but not sing) once approved by your doctor, 20-30 minutes of sustained activity or two 10 minute episodes of sustained activity every day.  -resistance training at least 2 days per week if approved by your doctor -balance exercises 3+ days per week:   Stand somewhere where you have something sturdy to hold onto if you lose balance.    1)  lift up on toes, start with 5x per day and work up to 20x   2) stand and lift on leg straight out to the side so that foot is a few inches of the floor, start with 5x each side and work up to 20x  each side   3) stand on one foot, start with 5 seconds each side and work up to 20 seconds on each side  If you need ideas or help with getting more active:  -Silver sneakers https://tools.silversneakers.com  -Walk with a Doc: http://stephens-thompson.biz/  -try to include resistance (weight lifting/strength building) and balance exercises twice per week: or the following link for ideas: ChessContest.fr  UpdateClothing.com.cy  STRESS MANAGEMENT: -can try meditating, or just sitting quietly with deep breathing while intentionally relaxing all parts of your body for 5 minutes daily -if you need further help with stress, anxiety or depression please follow up with your primary doctor or contact the wonderful folks at [place]: [place]: -options in [place] if you wish to engage in more social and exercise related activities:  -Silver sneakers https://tools.silversneakers.com  -Walk with a Doc: http://stephens-thompson.biz/  -Check out the [place] 50+ section on the [place] of [employer] (hiking clubs, book clubs, cards and games, chess, exercise classes, aquatic classes and much more) - see the website for details: https://www.[place]-.gov/departments/parks-recreation/active-adults50  -YouTube has lots of exercise videos for different ages and abilities as well  -[place] (a variety of indoor and outdoor inperson activities for adults). [phone]. [address]  -Virtual Online Classes (a variety of topics): see seniorplanet.org or call [phone]  -consider volunteering at a school, hospice center, church, senior center or elsewhere

## 2022-09-05 NOTE — Telephone Encounter (Signed)
Pt is call back and state she need a answer about her medication and want a call back.

## 2022-09-06 ENCOUNTER — Other Ambulatory Visit: Payer: Self-pay

## 2022-09-06 MED ORDER — PREDNISOLONE 5 MG PO TABS: 5.0000 mg | ORAL_TABLET | Freq: Every day | ORAL | 3 refills | Status: DC

## 2022-09-08 LAB — POCT INR: INR: 1.9 — AB (ref 2.0–3.0)

## 2022-09-09 ENCOUNTER — Ambulatory Visit: Payer: Medicare Other | Admitting: Podiatry

## 2022-09-09 ENCOUNTER — Ambulatory Visit (INDEPENDENT_AMBULATORY_CARE_PROVIDER_SITE_OTHER): Payer: Medicare Other

## 2022-09-09 DIAGNOSIS — Z7901 Long term (current) use of anticoagulants: Secondary | ICD-10-CM

## 2022-09-09 NOTE — Telephone Encounter (Signed)
Pt inform she is taking prednisone 5 mg, one tablet a day.   Contact [facility] to cancel the Prednisolone 5mg  as pt request to stay with prednisone. Spoke to [employer], [employer].

## 2022-09-09 NOTE — Telephone Encounter (Signed)
Spoke to patient. She explain that she was on prednisolone. But her pharmacy didn't carry it anymore and Dr. Volanda Napoleon okay for Prednisone 5mg . Now, her pharmacy saying they have prednisolone back but her insurance doesn't cover it. Pt states she wants to stay with Prednisone 5mg .   [facility] . It was signed by Dr. Volanda Napoleon and form was re-faxed with direction on prednisone 5mg .

## 2022-09-09 NOTE — Progress Notes (Signed)
Patient's daughter, Thayer Headings, helps test INR with home [employer] analyzer and submitted results via portal, yesterday, 09/08/22, after hours. INR was 21.9 Continue 1/2 tablet daily except take 1 tablet on Tuesdays and Saturdays. Recheck in 2 weeks, on 09/23/22. LVM for pt to call if any questions.  LVM for pt's daughter, Thayer Headings, and advised of change in dosing and to retest on 2/12. Thayer Headings verbalized understanding.

## 2022-09-09 NOTE — Telephone Encounter (Signed)
Left a voicemail to call us back.  

## 2022-09-09 NOTE — Patient Instructions (Addendum)
Pre visit review using our clinic review tool, if applicable. No additional management support is needed unless otherwise documented below in the visit note.  Continue 1/2 tablet daily except take 1 tablet on Tuesdays and Saturdays. Recheck in 2 weeks, on 09/23/22.

## 2022-09-09 NOTE — Telephone Encounter (Signed)
Received a fax from mail pharmacy that they are requesting for a PREDNISONE and not PrednisoLONE. Would like to verify with patient, which once she is needing.

## 2022-09-11 ENCOUNTER — Other Ambulatory Visit: Payer: Self-pay | Admitting: Family Medicine

## 2022-09-11 DIAGNOSIS — M069 Rheumatoid arthritis, unspecified: Secondary | ICD-10-CM

## 2022-09-11 DIAGNOSIS — M159 Polyosteoarthritis, unspecified: Secondary | ICD-10-CM

## 2022-09-11 MED ORDER — TRAMADOL HCL 50 MG PO TABS: ORAL_TABLET | ORAL | 5 refills | Status: DC

## 2022-09-11 NOTE — Telephone Encounter (Signed)
Tramadol refill sent to mail order pharmacy.

## 2022-09-25 LAB — POCT INR: INR: 1.8 — AB (ref 2.0–3.0)

## 2022-09-27 ENCOUNTER — Ambulatory Visit (INDEPENDENT_AMBULATORY_CARE_PROVIDER_SITE_OTHER): Payer: Medicare Other

## 2022-09-27 DIAGNOSIS — Z7901 Long term (current) use of anticoagulants: Secondary | ICD-10-CM | POA: Diagnosis not present

## 2022-09-27 NOTE — Progress Notes (Addendum)
Patient's daughter, Thayer Headings, helps test INR with home [employer] analyzer and submitted results via portal, yesterday, 09/26/22, after hours, but testing was performed on 09/25/22. INR was 1.8 Increase dose today to take 1 tablet and then change weekly dose to take 1/2 tablet daily except take 1 tablet on Tuesdays, Thursdays and Saturdays. Recheck in 2 weeks, on 10/09/22. Advised pt of change in dose and recheck in 2 weeks. Pt wrote instructions for dose and readback for verification. Advised if any changes to contact coumadin clinic. Pt verbalized understanding.  LVM for pt's daughter, Thayer Headings, and advised of change in dosing and to retest on 2/28.

## 2022-09-27 NOTE — Patient Instructions (Addendum)
Pre visit review using our clinic review tool, if applicable. No additional management support is needed unless otherwise documented below in the visit note.  Increase dose today to take 1 tablet and then change weekly dose to take 1/2 tablet daily except take 1 tablet on Tuesdays, Thursdays and Saturdays. Recheck in 2 weeks, on 10/09/22.

## 2022-10-07 ENCOUNTER — Ambulatory Visit (INDEPENDENT_AMBULATORY_CARE_PROVIDER_SITE_OTHER): Payer: Medicare Other

## 2022-10-07 DIAGNOSIS — I4821 Permanent atrial fibrillation: Secondary | ICD-10-CM | POA: Diagnosis not present

## 2022-10-07 DIAGNOSIS — Z7901 Long term (current) use of anticoagulants: Secondary | ICD-10-CM

## 2022-10-07 LAB — POCT INR: INR: 1.4 — AB (ref 2.0–3.0)

## 2022-10-07 NOTE — Progress Notes (Signed)
Pt has not felt well for several days and did not take any medication yesterday. Pt has had vomiting and diarrhea for a few days. Daughter is assuring pt is staying hydrated. Pt will go home with daughter tonight so she can keep an eye on her.   Increase dose today to take 1 tablet and increase dose tomorrow to take 1 1/2 tablets and then change weekly dose to take 1 tablet daily except take 1/2 tablet on Mondays, Wednesdays and Fridays. Recheck in 2 weeks, on 10/21/22.  Shirline Frees of dosing changes. She reports she is not sure if pt will be able to take medication tonight either. Advised if pt does not take booster dose today to take the booster dose intended for today on Wednesday. Advised if any s/s of a clot/stroke to call 911. Advised if any changes to contact the coumadin clinic. Thayer Headings read back dosing instructions and verbalized understanding.  Advised pt should have OV if not feeling better by tomorrow.

## 2022-10-07 NOTE — Patient Instructions (Addendum)
Pre visit review using our clinic review tool, if applicable. No additional management support is needed unless otherwise documented below in the visit note.  Increase dose today to take 1 tablet and increase dose tomorrow to take 1 1/2 tablets and then change weekly dose to take 1 tablet daily except take 1/2 tablet on Mondays, Wednesdays and Fridays. Recheck in 2 weeks, on 10/21/22.

## 2022-10-14 ENCOUNTER — Telehealth: Payer: Self-pay | Admitting: Family Medicine

## 2022-10-14 NOTE — Telephone Encounter (Signed)
Prescription Request  10/14/2022  LOV: 04/25/2022  What is the name of the medication or equipment? gabapentin (NEURONTIN) 400 MG capsule   Have you contacted your pharmacy to request a refill? No   Which pharmacy would you like this sent to?     [place], [place] [address] Phone: [phone] Fax: [phone]   Patient notified that their request is being sent to the clinical staff for review and that they should receive a response within 2 business days.   Please advise at Mobile [phone] (mobile)

## 2022-10-14 NOTE — Telephone Encounter (Signed)
Rx was sent on 08/25/2022 with 3 refills.  Spoke to [employer], Scientist, physiological. He said they will process the Rx on March 14 and mail out a day or two after.  Pt was made aware.

## 2022-10-17 NOTE — Telephone Encounter (Signed)
error 

## 2022-10-24 LAB — POCT INR: INR: 3.9 — AB (ref 2.0–3.0)

## 2022-10-25 ENCOUNTER — Ambulatory Visit (INDEPENDENT_AMBULATORY_CARE_PROVIDER_SITE_OTHER): Payer: Medicare Other

## 2022-10-25 DIAGNOSIS — Z7901 Long term (current) use of anticoagulants: Secondary | ICD-10-CM

## 2022-10-25 NOTE — Patient Instructions (Signed)
Pre visit review using our clinic review tool, if applicable. No additional management support is needed unless otherwise documented below in the visit note. 

## 2022-10-25 NOTE — Progress Notes (Signed)
Pt has had diarrhea in the AM for about 3 weeks. She reports it was not the entire 3 weeks, there was a break in the middle, but she has diarrhea currently. She reports blood on the toilet tissue yesterday, which she reports is from hemorrhoids and this happens when she has had diarrhea in the past. Advised to contact PCP office for apt. Pt said she would call today. Advised if any further s/s of abnormal bruising or bleeding to contact office or go to the ER. Pt verbalized understanding.   Patient's daughter, Stacey [name], helps test INR with home [employer] analyzer and submitted results via portal. INR today is 3.9. Hold dose today and then change weekly dose to take 1/2 tablet daily except take 1 tablet on Tuesdays, Thursdays and Saturdays. Recheck in 2 weeks, on 11/06/22. Contacted pt and advised of dose change. Pt readback dosing for verification. Pt verbalized understanding.  LVM with pt's daughter.

## 2022-11-07 ENCOUNTER — Telehealth: Payer: Self-pay | Admitting: Family Medicine

## 2022-11-07 ENCOUNTER — Ambulatory Visit (INDEPENDENT_AMBULATORY_CARE_PROVIDER_SITE_OTHER): Payer: Medicare Other

## 2022-11-07 DIAGNOSIS — Z7901 Long term (current) use of anticoagulants: Secondary | ICD-10-CM | POA: Diagnosis not present

## 2022-11-07 LAB — POCT INR: INR: 3.4 — AB (ref 2.0–3.0)

## 2022-11-07 NOTE — Progress Notes (Signed)
Patient's daughter, Thayer Headings, helps test INR with home [employer] analyzer and submitted results via portal. INR today is 3.4 Hold dose today and then change weekly dose to take 1/2 tablet daily except take 1 tablet on Tuesdays and Saturdays. Recheck in 2 weeks, on 11/21/22. Shirline Frees and pt of dosing changes. Both verbalized understanding.

## 2022-11-07 NOTE — Patient Instructions (Addendum)
Pre visit review using our clinic review tool, if applicable. No additional management support is needed unless otherwise documented below in the visit note.  Hold dose today and then change weekly dose to take 1/2 tablet daily except take 1 tablet on Tuesdays and Saturdays. Recheck in 2 weeks, on 11/21/22.

## 2022-11-07 NOTE — Telephone Encounter (Signed)
Patient would like a med check/follow up appointment per Dr. Julianne Rice last visit notes.

## 2022-11-23 LAB — POCT INR: INR: 1.6 — AB (ref 2.0–3.0)

## 2022-11-25 ENCOUNTER — Ambulatory Visit (INDEPENDENT_AMBULATORY_CARE_PROVIDER_SITE_OTHER): Payer: Medicare Other

## 2022-11-25 DIAGNOSIS — Z7901 Long term (current) use of anticoagulants: Secondary | ICD-10-CM

## 2022-11-25 NOTE — Progress Notes (Signed)
Patient's daughter, [name], helps test INR with home [employer] analyzer and submitted results via portal. INR today is 1.6 Increase dose today to take 1 tablet and then change weekly dose to take 1/2 tablet daily except take 1 tablet on Tuesdays, Thursdays and Saturdays. Recheck in 2 weeks, on 12/09/22. LVM for pt's daughter.

## 2022-11-25 NOTE — Patient Instructions (Signed)
Pre visit review using our clinic review tool, if applicable. No additional management support is needed unless otherwise documented below in the visit note. 

## 2022-12-02 ENCOUNTER — Encounter: Payer: Self-pay | Admitting: Podiatry

## 2022-12-02 ENCOUNTER — Ambulatory Visit (INDEPENDENT_AMBULATORY_CARE_PROVIDER_SITE_OTHER): Payer: Medicare Other | Admitting: Podiatry

## 2022-12-02 DIAGNOSIS — M79674 Pain in right toe(s): Secondary | ICD-10-CM | POA: Diagnosis not present

## 2022-12-02 DIAGNOSIS — I739 Peripheral vascular disease, unspecified: Secondary | ICD-10-CM

## 2022-12-02 DIAGNOSIS — Z7901 Long term (current) use of anticoagulants: Secondary | ICD-10-CM

## 2022-12-02 DIAGNOSIS — M79675 Pain in left toe(s): Secondary | ICD-10-CM | POA: Diagnosis not present

## 2022-12-02 DIAGNOSIS — B351 Tinea unguium: Secondary | ICD-10-CM | POA: Diagnosis not present

## 2022-12-02 NOTE — Progress Notes (Signed)
  Subjective:  Patient ID: Stacey [name], female    DOB: [birth year],   MRN: [id number]  Chief Complaint  Patient presents with   Nail Problem     Routine foot care     89 y.o. female presents for concern of thickened elongated and painful nails that are difficult to trim. Requesting to have them trimmed today. Relates burning and tingling in their feet. Patient has a history of PAD and on chronic anticoagulation.   Have not seen her since 03/13/22 for her wounds She was following up with wound care and doing well.      PCP:  [name], MD    . Denies any other pedal complaints. Denies n/v/f/c.   Past Medical History:  Diagnosis Date   Atrial fibrillation    Hernia of abdominal cavity    Kyphosis     Objective:  Physical Exam: Vascular: DP/PT pulses 2/4 bilateral. CFT <3 seconds. Normal hair growth on digits. No edema.  Skin. No lacerations or abrasions bilateral feet.Right heel ulcer healed   .Mild erythema surrounding, improved.  Right second digit -healed. No purulence noted. Nails 1-5 on the right and hallux nail on left are thickened and elongated with subungal debris.  Musculoskeletal: MMT 5/5 bilateral lower extremities in DF, PF, Inversion and Eversion. Deceased ROM in DF of ankle joint. Tender to wound area.  Neurological: Sensation intact to light touch.   Assessment:   1. Pain due to onychomycosis of toenails of both feet   2. PAD (peripheral artery disease)   3. Long term (current) use of anticoagulants [Z79.01]      Plan:  Patient was evaluated and treated and all questions answered. -Mechanically debrided all nails 1-5 bilateral using sterile nail nipper and filed with dremel without incident  -No open wounds today.  -Small pre-ulcerative area on dorsum of left hallux covered with neosporin and bandaid.  -Answered all patient questions -Patient to return  in 3 months for at risk foot care -Patient advised to call the office if any problems or  questions arise in the meantime.   Louann Sjogren, DPM

## 2022-12-05 DIAGNOSIS — I4821 Permanent atrial fibrillation: Secondary | ICD-10-CM | POA: Diagnosis not present

## 2022-12-05 DIAGNOSIS — Z7901 Long term (current) use of anticoagulants: Secondary | ICD-10-CM | POA: Diagnosis not present

## 2022-12-06 ENCOUNTER — Ambulatory Visit (INDEPENDENT_AMBULATORY_CARE_PROVIDER_SITE_OTHER): Payer: Medicare Other

## 2022-12-06 DIAGNOSIS — Z7901 Long term (current) use of anticoagulants: Secondary | ICD-10-CM

## 2022-12-06 LAB — POCT INR: INR: 2.4 (ref 2.0–3.0)

## 2022-12-06 MED ORDER — WARFARIN SODIUM 5 MG PO TABS: ORAL_TABLET | ORAL | 1 refills | Status: DC

## 2022-12-06 NOTE — Patient Instructions (Signed)
Pre visit review using our clinic review tool, if applicable. No additional management support is needed unless otherwise documented below in the visit note. 

## 2022-12-06 NOTE — Addendum Note (Signed)
Addended by: Sherrie George A on: 12/06/2022 11:21 AM   Modules accepted: Orders

## 2022-12-06 NOTE — Progress Notes (Addendum)
Patient's daughter, [name], helps test INR with home [employer] analyzer and submitted results via portal. INR today is 2.4 Continue 1/2 tablet daily except take 1 tablet on Tuesdays, Thursdays and Saturdays. Recheck in 2 weeks, on 12/18/22. Contacted pt and advised to continue dosing and recheck in 2 weeks. Pt requested refill of warfarin. Pt verbalized understanding. Also contacted pt's daughter, [name], and advised. [name] verbalized understanding.   Pt is compliant with warfarin management and PCP apts.  Sent in refill of warfarin to requested pharmacy.  Placed wrong provider on script. Contacted [facility] and cancelled order for warfarin with Dr. Lawerance Bach on it. Sent in new script with correct provider.

## 2022-12-11 ENCOUNTER — Telehealth: Payer: Self-pay | Admitting: Family Medicine

## 2022-12-11 ENCOUNTER — Ambulatory Visit (INDEPENDENT_AMBULATORY_CARE_PROVIDER_SITE_OTHER): Payer: Medicare Other | Admitting: Family Medicine

## 2022-12-11 ENCOUNTER — Ambulatory Visit (INDEPENDENT_AMBULATORY_CARE_PROVIDER_SITE_OTHER): Payer: Medicare Other

## 2022-12-11 ENCOUNTER — Encounter: Payer: Self-pay | Admitting: Family Medicine

## 2022-12-11 VITALS — BP 92/58 | HR 65 | Temp 98.3°F | Wt 96.0 lb

## 2022-12-11 DIAGNOSIS — M4125 Other idiopathic scoliosis, thoracolumbar region: Secondary | ICD-10-CM

## 2022-12-11 DIAGNOSIS — M159 Polyosteoarthritis, unspecified: Secondary | ICD-10-CM

## 2022-12-11 DIAGNOSIS — R109 Unspecified abdominal pain: Secondary | ICD-10-CM | POA: Diagnosis not present

## 2022-12-11 DIAGNOSIS — R0781 Pleurodynia: Secondary | ICD-10-CM | POA: Diagnosis not present

## 2022-12-11 DIAGNOSIS — M5432 Sciatica, left side: Secondary | ICD-10-CM | POA: Diagnosis not present

## 2022-12-11 DIAGNOSIS — M0579 Rheumatoid arthritis with rheumatoid factor of multiple sites without organ or systems involvement: Secondary | ICD-10-CM | POA: Diagnosis not present

## 2022-12-11 DIAGNOSIS — M25551 Pain in right hip: Secondary | ICD-10-CM

## 2022-12-11 DIAGNOSIS — R2689 Other abnormalities of gait and mobility: Secondary | ICD-10-CM

## 2022-12-11 DIAGNOSIS — K449 Diaphragmatic hernia without obstruction or gangrene: Secondary | ICD-10-CM | POA: Diagnosis not present

## 2022-12-11 NOTE — Telephone Encounter (Signed)
[facility] is calling and need clarification on warfarin (COUMADIN) 5 MG tablet  is it for 90 day supply reference number is [id number]. Lequita Halt said they need clarification by tomorrow

## 2022-12-11 NOTE — Telephone Encounter (Signed)
Contacted [facility] and talked to [place]. Verified script was for 90 days and verified sig. Chelsey verbalized understanding.

## 2022-12-11 NOTE — Progress Notes (Signed)
Established Patient Office Visit   Subjective  Patient ID: Stacey [name], female    DOB: 1932/12/16  Age: 89 y.o. MRN: [id number]  Chief Complaint  Patient presents with   Flank Pain    Right side pain for 7-10 days.started as nagging horrible pain, now it is just there. Depending how she moves it is bad, but when sitting or laying it is just acute pain. Taking tramadol and tylenol.   Pt accompanied by her granddaughter.  Pt is an 89 yo female with pmh sig for A-fib, CHF, chronic anticoagulation, subclavian artery stenosis, PVD, neuropathy, hearing loss, sciatica OA of multiple joints, RA, osteopenia, kyphosis, lymphedema who was seen for acute concern.  Patient with at least 3 falls since January 2024.  Endorses balance issues, feels unstable.  Having continued left low back pain and right side pain x 1 week.  Pain described as constant, nagging.  Worse with movement.  Taking tramadol and Tylenol without relief.  Patient denies dysuria, constipation, nausea, vomiting, urinary frequency.  Notes scoliosis and kyphosis worsening, more bent forward and to the right.  Stable on current meds for RA.      ROS Negative unless stated above    Objective:     BP (!) 92/58 (BP Location: Left Arm, Patient Position: Sitting, Cuff Size: Normal)   Pulse 65   Temp 98.3 F (36.8 C) (Oral)   Wt 96 lb (43.5 kg)   SpO2 93%   BMI 17.01 kg/m    Physical Exam Constitutional:      General: She is not in acute distress.    Appearance: Normal appearance.  HENT:     Head: Normocephalic and atraumatic.     Nose: Nose normal.     Mouth/Throat:     Mouth: Mucous membranes are moist.  Cardiovascular:     Rate and Rhythm: Normal rate and regular rhythm.     Heart sounds: Normal heart sounds. No murmur heard.    No gallop.  Pulmonary:     Effort: Pulmonary effort is normal. No respiratory distress.     Breath sounds: Normal breath sounds. No wheezing, rhonchi or rales.  Abdominal:      General: Bowel sounds are normal.     Palpations: Abdomen is soft.     Tenderness: There is no guarding.  Musculoskeletal:        General: Deformity present.     Cervical back: Deformity present.     Thoracic back: Deformity present.     Comments: Patient sitting in transport wheelchair, upper body contorted to the right.  Kyphosis.  Rheumatoid nodules on fingers.  Right flank tenderness.  Skin:    General: Skin is warm and dry.  Neurological:     Mental Status: She is alert and oriented to person, place, and time.      No results found for any visits on 12/11/22.    Assessment & Plan:  Rib pain on right side -     DG Chest 2 View  Right flank pain  Other idiopathic scoliosis, thoracolumbar region  Primary osteoarthritis involving multiple joints -Stable -Continue current medications  Rheumatoid arthritis involving multiple sites with positive rheumatoid factor (HCC) -Stable -Continue current medications  Sciatica of left side  Balance problem   Increased falls likely 2/2 worsening scoliosis and kyphosis causing patient to lean forward more causing balance issues.  Increased sitting likely attributing to sciatica.  discussed possible causes of flank pain including fracture, renal calculi, UTI, and malposition.  Obtain CXR to rule out pneumonia, as could also contribute to balance issues and pain.  Further recommendations based on imaging results.  Consider topical lidocaine patches as well as other topical analgesics.  Consider PT. fall prevention discussed.   Return if symptoms worsen or fail to improve.   [name], MD

## 2022-12-11 NOTE — Patient Instructions (Addendum)
You can use topical lidocaine patches to help with the discomfort of your right side.  This can be found over-the-counter at your local drugstore.  You can also use topical creams like Biofreeze, Aspercreme, IcyHot.  Consider going back to physical therapy to help work on posture and balance.  Worsening scoliosis is likely contributing to current symptoms including the pain as well as the balance issues.

## 2022-12-13 ENCOUNTER — Telehealth: Payer: Self-pay | Admitting: Family Medicine

## 2022-12-13 NOTE — Telephone Encounter (Signed)
Pt call and stated she had a xray done on Wednesday and want a call back to tell her the result are.

## 2022-12-16 ENCOUNTER — Encounter: Payer: Self-pay | Admitting: Family Medicine

## 2022-12-18 NOTE — Telephone Encounter (Signed)
See result note.  

## 2022-12-19 ENCOUNTER — Other Ambulatory Visit: Payer: Self-pay | Admitting: Family Medicine

## 2022-12-23 LAB — POCT INR: INR: 2.6 (ref 2.0–3.0)

## 2022-12-24 ENCOUNTER — Ambulatory Visit (INDEPENDENT_AMBULATORY_CARE_PROVIDER_SITE_OTHER): Payer: Medicare Other

## 2022-12-24 DIAGNOSIS — Z7901 Long term (current) use of anticoagulants: Secondary | ICD-10-CM | POA: Diagnosis not present

## 2022-12-24 NOTE — Progress Notes (Signed)
Patient's daughter, [name], helps test INR with home [employer] analyzer and submitted results via portal. INR today is 2.6 Continue 1/2 tablet daily except take 1 tablet on Tuesdays, Thursdays and Saturdays. Recheck in 3 weeks, on 01/13/23. Contacted pt and advised to continue dosing and recheck in 3 weeks. Pt verbalized understanding. Also LVM for pt's daughter.

## 2022-12-24 NOTE — Patient Instructions (Signed)
Pre visit review using our clinic review tool, if applicable. No additional management support is needed unless otherwise documented below in the visit note. 

## 2022-12-27 ENCOUNTER — Ambulatory Visit: Payer: Medicare Other | Admitting: Family Medicine

## 2023-01-01 ENCOUNTER — Ambulatory Visit (INDEPENDENT_AMBULATORY_CARE_PROVIDER_SITE_OTHER): Payer: Medicare Other

## 2023-01-01 ENCOUNTER — Ambulatory Visit (INDEPENDENT_AMBULATORY_CARE_PROVIDER_SITE_OTHER): Payer: Medicare Other | Admitting: Family Medicine

## 2023-01-01 VITALS — BP 96/68 | HR 100 | Temp 97.4°F

## 2023-01-01 DIAGNOSIS — R9389 Abnormal findings on diagnostic imaging of other specified body structures: Secondary | ICD-10-CM | POA: Diagnosis not present

## 2023-01-01 DIAGNOSIS — H1131 Conjunctival hemorrhage, right eye: Secondary | ICD-10-CM | POA: Diagnosis not present

## 2023-01-01 DIAGNOSIS — H9193 Unspecified hearing loss, bilateral: Secondary | ICD-10-CM

## 2023-01-01 DIAGNOSIS — R202 Paresthesia of skin: Secondary | ICD-10-CM | POA: Diagnosis not present

## 2023-01-01 DIAGNOSIS — M47812 Spondylosis without myelopathy or radiculopathy, cervical region: Secondary | ICD-10-CM | POA: Diagnosis not present

## 2023-01-01 DIAGNOSIS — M0579 Rheumatoid arthritis with rheumatoid factor of multiple sites without organ or systems involvement: Secondary | ICD-10-CM

## 2023-01-01 DIAGNOSIS — Z7901 Long term (current) use of anticoagulants: Secondary | ICD-10-CM | POA: Diagnosis not present

## 2023-01-01 DIAGNOSIS — M542 Cervicalgia: Secondary | ICD-10-CM

## 2023-01-01 DIAGNOSIS — I509 Heart failure, unspecified: Secondary | ICD-10-CM | POA: Diagnosis not present

## 2023-01-01 NOTE — Progress Notes (Signed)
Established Patient Office Visit   Subjective  Patient ID: Stacey [name], female    DOB: [birth year]  Age: 89 y.o. MRN: [id number]  Chief Complaint  Patient presents with   Medical Management of Chronic Issues    Discuss other xrays, has cardio visit next month.   Pt accompanied by her granddaughter.  Pt is an 89 yo female with kyphosis, afib on coumadin, RA, OA, radiculopathy who was seen for f/u on results.  Since last OFV, pt no longer having R side pain.  CXR limited due to degree of pt rotation but showed age indeterminate L sided rib fxs.  A dedicated rib study was recommended. Cardiomegaly due to CHF and hiatal hernia noted.  Pt here for additional imaging.  Having increased neck pain.  Base of L neck with paresthesias into arm.  Feels like kyphosis has become worse.  Pt leaning forward and twisted more.  Pt with hemorrhage of R eye. Area is painless.  Has happened before.  Pt on coumadin for afib.  Pt would like ears checked.  Hearing decreased.   Past Medical History:  Diagnosis Date   Atrial fibrillation (HCC)    Hernia of abdominal cavity    Kyphosis    Past Surgical History:  Procedure Laterality Date   ABDOMINAL HYSTERECTOMY     CATARACT EXTRACTION, BILATERAL     TONSILLECTOMY     Social History   Tobacco Use   Smoking status: Former   Smokeless tobacco: Never  Building services engineer Use: Never used  Substance Use Topics   Alcohol use: No   Drug use: No   Family History  Problem Relation Age of Onset   Healthy Mother    Heart failure Father    Allergies  Allergen Reactions   Sulfa Antibiotics Anaphylaxis   Tape Other (See Comments)    BRUISES VERY EASILY!!!!   Codeine Nausea And Vomiting   Hydroxychloroquine     Other reaction(s): GI upset   Shellfish-Derived Products Other (See Comments)    Daughter was unsure of the reaction- "happened years ago" and patient avoids foods that contain shellfish   Latex Rash   Neosporin Original  [Bacitracin-Neomycin-Polymyxin] Rash   Polysporin [Bacitracin-Polymyxin B] Rash      ROS Negative unless stated above    Objective:     BP 96/68 (BP Location: Left Arm, Patient Position: Sitting, Cuff Size: Normal)   Pulse 100   Temp (!) 97.4 F (36.3 C) (Oral)   SpO2 93%  Hearing Screening   500Hz  1000Hz  2000Hz  4000Hz   Right ear Fail Fail Fail Pass  Left ear Fail Pass Fail Fail      Physical Exam Constitutional:      General: She is not in acute distress.    Appearance: Normal appearance.  HENT:     Head: Normocephalic and atraumatic.     Nose: Nose normal.     Mouth/Throat:     Mouth: Mucous membranes are moist.  Eyes:     Conjunctiva/sclera:     Right eye: Hemorrhage present.   Cardiovascular:     Rate and Rhythm: Normal rate and regular rhythm.     Heart sounds: Normal heart sounds. No murmur heard.    No gallop.  Pulmonary:     Effort: Pulmonary effort is normal. No respiratory distress.     Breath sounds: Normal breath sounds. No wheezing, rhonchi or rales.  Musculoskeletal:     Right hand: Deformity present.     Left  hand: Deformity present.     Thoracic back: Scoliosis present.       Back:     Comments: Kyphosis.  TTP of lower cervical spine and medial shoulder.  Skin:    General: Skin is warm and dry.  Neurological:     Mental Status: She is alert and oriented to person, place, and time.     Comments: Gait not assessed as sitting in transport wheelchair.     No results found for any visits on 01/01/23.    Assessment & Plan:  Abnormal x-ray -L sided age-indeterminate rib fractures noted on recent CXR.  Dedicated rib study recommended.  Study ordered this visit, however canceled as pt unable to complete 2/2 stand straight due to kyphosis. -     DG Cervical Spine Complete  Neck pain -Likely 2/2 worsening kyphosis/scoliosis -Supportive care including heat, topical analgesics, massage, Tylenol or NSAIDs as needed -     DG Cervical Spine  Complete  Paresthesias -likely 2/2 radiculopathy -     DG Cervical Spine Complete  Conjunctival hemorrhage, right eye -likely 2/2 coumadin use. -supportive care -Ophthalmology f/u for worsened symptoms  Long term (current) use of anticoagulants [Z79.01] -continue coumadin for afib  Decreased hearing of both ears -abnormal hearing results -Discussed formal hearing testing with audiologist  Rheumatoid arthritis involving multiple sites with positive rheumatoid factor (HCC) -continue low dose prednisolone  Congestive heart failure, unspecified HF chronicity, unspecified heart failure type (HCC) -stable -Moderate enlargement of heart noted on CXR from 12/11/2022 -Follow-up with cardiology in the next few weeks  Return if symptoms worsen or fail to improve.   [name], MD

## 2023-01-16 ENCOUNTER — Encounter: Payer: Self-pay | Admitting: Family Medicine

## 2023-01-19 LAB — POCT INR: INR: 4.4 — AB (ref 2.0–3.0)

## 2023-01-20 ENCOUNTER — Ambulatory Visit (INDEPENDENT_AMBULATORY_CARE_PROVIDER_SITE_OTHER): Payer: Medicare Other

## 2023-01-20 DIAGNOSIS — Z7901 Long term (current) use of anticoagulants: Secondary | ICD-10-CM | POA: Diagnosis not present

## 2023-01-20 NOTE — Patient Instructions (Addendum)
Pre visit review using our clinic review tool, if applicable. No additional management support is needed unless otherwise documented below in the visit note.  Hold dose today and then change weekly dose to take 1/2 tablet daily except take 1 tablet on Thursdays. Recheck in 2 weeks, on 02/03/23.

## 2023-01-20 NOTE — Progress Notes (Cosign Needed Addendum)
Patient's daughter, [name], helps test INR with home [employer] analyzer and submitted results via portal yesterday, Sunday. INR today is 4.4 Pt reports she has not had any appetite for several weeks and finds it difficult to eat because of decreased taste and smell. Pt denies any s/s of abnormal bruising or bleeding.  Pt took dose yesterday. Hold dose today and then change weekly dose to take 1/2 tablet daily except take 1 tablet on Thursdays. Recheck in 2 weeks, on 02/03/23. If any s/s of bleeding or abnormal bruising go to the ER. Pt verbalized understanding.  Contacted pt and advised of change in dosing and recheck in 2 weeks. Pt verbalized understanding. LVM for pt's daughter, [name].

## 2023-01-27 ENCOUNTER — Encounter: Payer: Self-pay | Admitting: Internal Medicine

## 2023-01-27 ENCOUNTER — Ambulatory Visit: Payer: Medicare Other | Attending: Internal Medicine | Admitting: Internal Medicine

## 2023-01-27 VITALS — BP 110/68 | HR 81 | Ht 63.0 in | Wt 90.8 lb

## 2023-01-27 DIAGNOSIS — I482 Chronic atrial fibrillation, unspecified: Secondary | ICD-10-CM | POA: Insufficient documentation

## 2023-01-27 NOTE — Progress Notes (Signed)
Cardiology Office Note:    Date:  01/27/2023   ID:  Stacey [name], [place] Oct 24, 1932, MRN [id number]  PCP:  [name], MD   [facility] Providers Cardiologist:  Maisie Fus, MD     Referring MD: [name], MD   No chief complaint on file. Follow-up  History of Present Illness:    Stacey [name] is a 89 y.o. female with a hx of persistent atrial fibrillation, chronic anticoagulation (Xarelto then GI bleed then Coumadin), chronic diastolic heart failure, bilateral subclavian stenosis, referral for FU. She saw Dr. Katrinka Blazing in the past.   She feels well today. She denies palpitations. She notes some sharp CP in the evening. Does not last long.  Her diuretic works for leg swelling. No PND. No bruising  Past Medical History:  Diagnosis Date   Atrial fibrillation (HCC)    Hernia of abdominal cavity    Kyphosis     Past Surgical History:  Procedure Laterality Date   ABDOMINAL HYSTERECTOMY     CATARACT EXTRACTION, BILATERAL     TONSILLECTOMY      Current Medications: Current Outpatient Medications on File Prior to Visit  Medication Sig Dispense Refill   acetaminophen (TYLENOL) 500 MG tablet Take 500 mg by mouth every 6 (six) hours as needed. WITH TRAMADOL     atorvastatin (LIPITOR) 10 MG tablet Take 1 tablet (10 mg total) by mouth daily. 90 tablet 3   cyanocobalamin 1000 MCG tablet Take 1,000 mcg by mouth daily.     fexofenadine (ALLEGRA ALLERGY) 60 MG tablet Take 1 tablet (60 mg total) by mouth daily as needed for allergies or rhinitis. 90 tablet 3   gabapentin (NEURONTIN) 400 MG capsule TAKE 1 CAPSULE DAILY 90 capsule 3   hydrocortisone 1 % ointment Apply 1 Application topically 2 (two) times daily. 30 g 0   magic mouthwash w/lidocaine SOLN Take 5 mLs by mouth 4 (four) times daily as needed for mouth pain. 120 mL 0   meclizine (ANTIVERT) 12.5 MG tablet Take 1 tablet (12.5 mg total) by mouth 3 (three) times daily as needed for dizziness. 30 tablet 0    metoprolol tartrate (LOPRESSOR) 50 MG tablet TAKE 1 TABLET TWICE A DAY 180 tablet 3   Multiple Vitamin (MULTIVITAMIN) capsule Take 1 capsule by mouth daily.     mupirocin cream (BACTROBAN) 2 % Apply fingertip amount to wound area daily. 15 g 0   omeprazole (PRILOSEC) 20 MG capsule 1 capsule 30 minutes before morning meal     potassium chloride SA (KLOR-CON M) 20 MEQ tablet TAKE 1 TABLET BY MOUTH DAILY 90 tablet 3   prednisoLONE 5 MG TABS tablet Take 1 tablet (5 mg total) by mouth daily. 90 tablet 3   sertraline (ZOLOFT) 100 MG tablet TAKE 1 TABLET DAILY 90 tablet 3   spironolactone (ALDACTONE) 25 MG tablet Take 0.5 tablets (12.5 mg total) by mouth daily. 90 tablet 3   torsemide (DEMADEX) 20 MG tablet TAKE 2 TABLETS DAILY 180 tablet 3   traMADol (ULTRAM) 50 MG tablet TAKE 2 TABLETS TWICE A DAY AS NEEDED 120 tablet 5   Vitamin D, Cholecalciferol, 25 MCG (1000 UT) TABS Take 1,000 Units by mouth daily.     warfarin (COUMADIN) 5 MG tablet TAKE 1/2 TABLET BY MOUTH DAILY EXCEPT TAKE 1 TABLET ON TUESDAYS, THURSDAYS AND SATURDAYS OR AS DIRECTED BY ANTICOAGULATION CLINIC 90 tablet 1   No current facility-administered medications on file prior to visit.    Allergies:  Sulfa antibiotics, Tape, Codeine, Hydroxychloroquine, Shellfish-derived products, Latex, Neosporin original [bacitracin-neomycin-polymyxin], and Polysporin [bacitracin-polymyxin b]   Social History   Socioeconomic History   Marital status: Widowed    Spouse name: Not on file   Number of children: Not on file   Years of education: Not on file   Highest education level: Not on file  Occupational History   Not on file  Tobacco Use   Smoking status: Former   Smokeless tobacco: Never  Vaping Use   Vaping Use: Never used  Substance and Sexual Activity   Alcohol use: No   Drug use: No   Sexual activity: Not on file  Other Topics Concern   Not on file  Social History Narrative   Not on file   Social Determinants of Health    Financial Resource Strain: Low Risk  (08/31/2021)   Overall Financial Resource Strain (CARDIA)    Difficulty of Paying Living Expenses: Not hard at all  Food Insecurity: No Food Insecurity (08/31/2021)   Hunger Vital Sign    Worried About Running Out of Food in the Last Year: Never true    Ran Out of Food in the Last Year: Never true  Transportation Needs: No Transportation Needs (08/31/2021)   PRAPARE - Administrator, Civil Service (Medical): No    Lack of Transportation (Non-Medical): No  Physical Activity: Inactive (08/31/2021)   Exercise Vital Sign    Days of Exercise per Week: 0 days    Minutes of Exercise per Session: 0 min  Stress: No Stress Concern Present (08/31/2021)   [employer] of Occupational Health - Occupational Stress Questionnaire    Feeling of Stress : Not at all  Social Connections: Socially Isolated (08/31/2021)   Social Connection and Isolation Panel [NHANES]    Frequency of Communication with Friends and Family: More than three times a week    Frequency of Social Gatherings with Friends and Family: More than three times a week    Attends Religious Services: Never    Database administrator or Organizations: No    Attends Banker Meetings: Never    Marital Status: Widowed     Family History: The patient's family history includes Healthy in her mother; Heart failure in her father.  ROS:   Please see the history of present illness.     All other systems reviewed and are negative.  EKGs/Labs/Other Studies Reviewed:    The following studies were reviewed today: ECHOCARDIOGRAM 2019: Study Conclusions   - Left ventricle: The cavity size was normal. Systolic function was    normal. The estimated ejection fraction was in the range of 60%    to 65%. Wall motion was normal; there were no regional wall    motion abnormalities. The study is not technically sufficient to    allow evaluation of LV diastolic function.  - Aortic valve:  Sclerosis without stenosis.  - Mitral valve: Calcified annulus. Moderately thickened, mildly    calcified leaflets . There was mild regurgitation.  - Left atrium: The atrium was moderately dilated.  - Right ventricle: The cavity size was mildly dilated. Wall    thickness was normal.  - Right atrium: The atrium was moderately dilated.  - Atrial septum: No defect or patent foramen ovale was identified.  - Tricuspid valve: There was moderate regurgitation.   EKG:  EKG is  ordered today.  The ekg ordered today demonstrates   01/27/2023- afib, rate 89  Recent Labs: No results found  for requested labs within last 365 days.  Recent Lipid Panel No results found for: "CHOL", "TRIG", "HDL", "CHOLHDL", "VLDL", "LDLCALC", "LDLDIRECT"   Risk Assessment/Calculations:    CHA2DS2-VASc Score = 5   This indicates a 7.2% annual risk of stroke. The patient's score is based upon: CHF History: 1 HTN History: 0 Diabetes History: 0 Stroke History: 0 Vascular Disease History: 1 Age Score: 2 Gender Score: 1               Physical Exam:    VS:   Vitals:   01/27/23 1623  BP: 110/68  Pulse: 81  SpO2: 95%     Wt Readings from Last 3 Encounters:  01/27/23 90 lb 12.8 oz (41.2 kg)  12/11/22 96 lb (43.5 kg)  06/03/22 100 lb 6.4 oz (45.5 kg)     GEN: sitting in a wheelchair, contracted/kyphoscoliosis Well nourished, well developed in no acute distress HEENT: Normal NECK: No JVD; No carotid bruits CARDIAC: IRRR, no murmurs, RESPIRATORY:  Clear to auscultation without rales, wheezing or rhonchi  ABDOMEN: Soft, non-tender, non-distended MUSCULOSKELETAL:  No LE edema SKIN: Warm and dry NEUROLOGIC:  Alert and oriented x 3 PSYCHIATRIC:  Normal affect   ASSESSMENT:    Chronic Afib -asymptomatic, in afib today, rate controlled -coumadin (GI bleed on xarelto)  HFpEF - euvolemic - continue torsemide 40 mg daily and spironolactone 12.5 mg daily  Severe BL subclavian stenosis: chronic.     PLAN:    In order of problems listed above:  No changes Follow up 6 months           Medication Adjustments/Labs and Tests Ordered: Current medicines are reviewed at length with the patient today.  Concerns regarding medicines are outlined above.  No orders of the defined types were placed in this encounter.  No orders of the defined types were placed in this encounter.   Patient Instructions  Medication Instructions:  No changes *If you need a refill on your cardiac medications before your next appointment, please call your pharmacy*   Lab Work: No changes If you have labs (blood work) drawn today and your tests are completely normal, you will receive your results only by: MyChart Message (if you have MyChart) OR A paper copy in the mail If you have any lab test that is abnormal or we need to change your treatment, we will call you to review the results.   Testing/Procedures: No changes   Follow-Up: At [facility], you and your health needs are our priority.  As part of our continuing mission to provide you with exceptional heart care, we have created designated Provider Care Teams.  These Care Teams include your primary Cardiologist (physician) and Advanced Practice Providers (APPs -  Physician Assistants and Nurse Practitioners) who all work together to provide you with the care you need, when you need it.  We recommend signing up for the patient portal called "MyChart".  Sign up information is provided on this After Visit Summary.  MyChart is used to connect with patients for Virtual Visits (Telemedicine).  Patients are able to view lab/test results, encounter notes, upcoming appointments, etc.  Non-urgent messages can be sent to your provider as well.   To learn more about what you can do with MyChart, go to [email or web address].    Your next appointment:   6 month(s)  Provider:   Maisie Fus, MD       Signed, Maisie Fus, MD   01/27/2023 4:56 PM  [place]

## 2023-01-27 NOTE — Patient Instructions (Signed)
Medication Instructions:  No changes *If you need a refill on your cardiac medications before your next appointment, please call your pharmacy*   Lab Work: No changes If you have labs (blood work) drawn today and your tests are completely normal, you will receive your results only by: MyChart Message (if you have MyChart) OR A paper copy in the mail If you have any lab test that is abnormal or we need to change your treatment, we will call you to review the results.   Testing/Procedures: No changes   Follow-Up: At [facility], you and your health needs are our priority.  As part of our continuing mission to provide you with exceptional heart care, we have created designated Provider Care Teams.  These Care Teams include your primary Cardiologist (physician) and Advanced Practice Providers (APPs -  Physician Assistants and Nurse Practitioners) who all work together to provide you with the care you need, when you need it.  We recommend signing up for the patient portal called "MyChart".  Sign up information is provided on this After Visit Summary.  MyChart is used to connect with patients for Virtual Visits (Telemedicine).  Patients are able to view lab/test results, encounter notes, upcoming appointments, etc.  Non-urgent messages can be sent to your provider as well.   To learn more about what you can do with MyChart, go to [email or web address].    Your next appointment:   6 month(s)  Provider:   Maisie Fus, MD

## 2023-02-07 ENCOUNTER — Ambulatory Visit (INDEPENDENT_AMBULATORY_CARE_PROVIDER_SITE_OTHER): Payer: Medicare Other

## 2023-02-07 DIAGNOSIS — Z7901 Long term (current) use of anticoagulants: Secondary | ICD-10-CM | POA: Diagnosis not present

## 2023-02-07 LAB — POCT INR: INR: 2.6 (ref 2.0–3.0)

## 2023-02-07 NOTE — Progress Notes (Signed)
Patient's daughter, Stacey [name], helps test INR with home [employer] analyzer and submitted results via portal yesterday, Sunday. INR today is 2.6 Continue 1/2 tablet daily except take 1 tablet on Thursdays. Recheck in 2 weeks, on 02/21/23. Contacted pt and advised of change in dosing and recheck in 2 weeks. Pt verbalized understanding. LVM for pt's daughter, Stacey [name].

## 2023-02-07 NOTE — Patient Instructions (Addendum)
Pre visit review using our clinic review tool, if applicable. No additional management support is needed unless otherwise documented below in the visit note.  Continue 1/2 tablet daily except take 1 tablet on Thursdays. Recheck in 2 weeks, on 02/21/23.

## 2023-02-23 DIAGNOSIS — Z7901 Long term (current) use of anticoagulants: Secondary | ICD-10-CM | POA: Diagnosis not present

## 2023-02-23 DIAGNOSIS — I4821 Permanent atrial fibrillation: Secondary | ICD-10-CM | POA: Diagnosis not present

## 2023-02-24 ENCOUNTER — Ambulatory Visit (INDEPENDENT_AMBULATORY_CARE_PROVIDER_SITE_OTHER): Payer: Medicare Other

## 2023-02-24 DIAGNOSIS — Z7901 Long term (current) use of anticoagulants: Secondary | ICD-10-CM

## 2023-02-24 LAB — POCT INR: INR: 1.8 — AB (ref 2.0–3.0)

## 2023-02-24 NOTE — Progress Notes (Signed)
Patient's daughter, [name], helps test INR with home [employer] analyzer and submitted results via portal. INR today is 1.8. Increase dose today to take 1 tablet and then continue 1/2 tablet daily except take 1 tablet on Thursdays. Recheck in 2 weeks, on 03/10/23. LVM for pt of change in dosing and recheck in 2 weeks. Pt verbalized understanding. LVM for pt's daughter, [name].

## 2023-02-24 NOTE — Patient Instructions (Addendum)
Pre visit review using our clinic review tool, if applicable. No additional management support is needed unless otherwise documented below in the visit note.  Increase dose today to take 1 tablet and then continue 1/2 tablet daily except take 1 tablet on Thursdays. Recheck in 2 weeks, on 03/10/23.

## 2023-03-03 ENCOUNTER — Encounter: Payer: Self-pay | Admitting: Podiatry

## 2023-03-03 ENCOUNTER — Ambulatory Visit (INDEPENDENT_AMBULATORY_CARE_PROVIDER_SITE_OTHER): Payer: Medicare Other | Admitting: Podiatry

## 2023-03-03 DIAGNOSIS — I739 Peripheral vascular disease, unspecified: Secondary | ICD-10-CM | POA: Diagnosis not present

## 2023-03-03 DIAGNOSIS — M79675 Pain in left toe(s): Secondary | ICD-10-CM

## 2023-03-03 DIAGNOSIS — M05771 Rheumatoid arthritis with rheumatoid factor of right ankle and foot without organ or systems involvement: Secondary | ICD-10-CM | POA: Diagnosis not present

## 2023-03-03 DIAGNOSIS — M05772 Rheumatoid arthritis with rheumatoid factor of left ankle and foot without organ or systems involvement: Secondary | ICD-10-CM | POA: Diagnosis not present

## 2023-03-03 DIAGNOSIS — B351 Tinea unguium: Secondary | ICD-10-CM

## 2023-03-03 DIAGNOSIS — M79674 Pain in right toe(s): Secondary | ICD-10-CM

## 2023-03-03 NOTE — Progress Notes (Signed)
  Subjective:  Patient ID: Stacey [name], female    DOB: 06-03-1933,   MRN: [id number]  Chief Complaint  Patient presents with   Nail Problem    RFC     89 y.o. female presents for concern of thickened elongated and painful nails that are difficult to trim. Requesting to have them trimmed today. Relates burning and tingling in their feet. Patient has a history of PAD and on chronic anticoagulation.        PCP:  [name], MD    . Denies any other pedal complaints. Denies n/v/f/c.   Past Medical History:  Diagnosis Date   Atrial fibrillation (HCC)    Hernia of abdominal cavity    Kyphosis     Objective:  Physical Exam: Vascular: DP/PT pulses 2/4 bilateral. CFT <3 seconds. Normal hair growth on digits. No edema.  Skin. No lacerations or abrasions bilateral feet.Right heel ulcer healed   .Mild erythema surrounding, improved.  Right second digit -healed. No purulence noted. Nails 1-5 on the right and hallux nail on left are thickened and elongated with subungal debris.  Musculoskeletal: MMT 5/5 bilateral lower extremities in DF, PF, Inversion and Eversion. Deceased ROM in DF of ankle joint. Tender to wound area.  Neurological: Sensation intact to light touch.   Assessment:   1. Pain due to onychomycosis of toenails of both feet   2. PAD (peripheral artery disease) (HCC)   3. Rheumatoid arthritis involving both feet with positive rheumatoid factor (HCC)       Plan:  Patient was evaluated and treated and all questions answered. -Mechanically debrided all nails 1-5 bilateral using sterile nail nipper and filed with dremel without incident  -No open wounds today.  -Small pre-ulcerative area on dorsum of right second digit  covered with neosporin and bandaid.  -Answered all patient questions -Patient to return  in 3 months for at risk foot care -Patient advised to call the office if any problems or questions arise in the meantime.   Louann Sjogren, DPM

## 2023-03-04 ENCOUNTER — Other Ambulatory Visit: Payer: Self-pay | Admitting: Family Medicine

## 2023-03-04 DIAGNOSIS — E782 Mixed hyperlipidemia: Secondary | ICD-10-CM

## 2023-03-12 LAB — POCT INR: INR: 2 (ref 2.0–3.0)

## 2023-03-13 ENCOUNTER — Encounter (HOSPITAL_COMMUNITY): Payer: Self-pay

## 2023-03-13 ENCOUNTER — Emergency Department (HOSPITAL_COMMUNITY): Payer: Medicare Other

## 2023-03-13 ENCOUNTER — Ambulatory Visit (INDEPENDENT_AMBULATORY_CARE_PROVIDER_SITE_OTHER): Payer: Medicare Other

## 2023-03-13 ENCOUNTER — Inpatient Hospital Stay (HOSPITAL_COMMUNITY)
Admission: EM | Admit: 2023-03-13 | Discharge: 2023-03-18 | DRG: 535 | Disposition: A | Discharge status: Skilled Nursing Facility | Payer: Medicare Other | Source: Skilled Nursing Facility | Attending: Family Medicine | Admitting: Family Medicine

## 2023-03-13 ENCOUNTER — Telehealth: Payer: Self-pay | Admitting: Family Medicine

## 2023-03-13 ENCOUNTER — Ambulatory Visit: Payer: Medicare Other | Admitting: Family Medicine

## 2023-03-13 ENCOUNTER — Other Ambulatory Visit: Payer: Self-pay

## 2023-03-13 DIAGNOSIS — M85862 Other specified disorders of bone density and structure, left lower leg: Secondary | ICD-10-CM | POA: Diagnosis not present

## 2023-03-13 DIAGNOSIS — M810 Age-related osteoporosis without current pathological fracture: Secondary | ICD-10-CM | POA: Diagnosis present

## 2023-03-13 DIAGNOSIS — J939 Pneumothorax, unspecified: Secondary | ICD-10-CM | POA: Diagnosis not present

## 2023-03-13 DIAGNOSIS — D649 Anemia, unspecified: Secondary | ICD-10-CM | POA: Diagnosis not present

## 2023-03-13 DIAGNOSIS — S32512D Fracture of superior rim of left pubis, subsequent encounter for fracture with routine healing: Secondary | ICD-10-CM | POA: Diagnosis not present

## 2023-03-13 DIAGNOSIS — S32592A Other specified fracture of left pubis, initial encounter for closed fracture: Principal | ICD-10-CM | POA: Diagnosis present

## 2023-03-13 DIAGNOSIS — Z91040 Latex allergy status: Secondary | ICD-10-CM | POA: Diagnosis not present

## 2023-03-13 DIAGNOSIS — M159 Polyosteoarthritis, unspecified: Secondary | ICD-10-CM

## 2023-03-13 DIAGNOSIS — I5032 Chronic diastolic (congestive) heart failure: Secondary | ICD-10-CM | POA: Diagnosis present

## 2023-03-13 DIAGNOSIS — Z882 Allergy status to sulfonamides status: Secondary | ICD-10-CM | POA: Diagnosis not present

## 2023-03-13 DIAGNOSIS — Z87891 Personal history of nicotine dependence: Secondary | ICD-10-CM | POA: Diagnosis not present

## 2023-03-13 DIAGNOSIS — M069 Rheumatoid arthritis, unspecified: Secondary | ICD-10-CM | POA: Diagnosis present

## 2023-03-13 DIAGNOSIS — Y92099 Unspecified place in other non-institutional residence as the place of occurrence of the external cause: Secondary | ICD-10-CM | POA: Diagnosis not present

## 2023-03-13 DIAGNOSIS — M8580 Other specified disorders of bone density and structure, unspecified site: Secondary | ICD-10-CM | POA: Diagnosis not present

## 2023-03-13 DIAGNOSIS — Z7901 Long term (current) use of anticoagulants: Secondary | ICD-10-CM

## 2023-03-13 DIAGNOSIS — M25552 Pain in left hip: Secondary | ICD-10-CM | POA: Diagnosis not present

## 2023-03-13 DIAGNOSIS — I10 Essential (primary) hypertension: Secondary | ICD-10-CM | POA: Diagnosis not present

## 2023-03-13 DIAGNOSIS — R9082 White matter disease, unspecified: Secondary | ICD-10-CM | POA: Diagnosis not present

## 2023-03-13 DIAGNOSIS — Z9181 History of falling: Secondary | ICD-10-CM | POA: Diagnosis not present

## 2023-03-13 DIAGNOSIS — Z7952 Long term (current) use of systemic steroids: Secondary | ICD-10-CM | POA: Diagnosis not present

## 2023-03-13 DIAGNOSIS — Z681 Body mass index (BMI) 19 or less, adult: Secondary | ICD-10-CM | POA: Diagnosis not present

## 2023-03-13 DIAGNOSIS — Z91013 Allergy to seafood: Secondary | ICD-10-CM | POA: Diagnosis not present

## 2023-03-13 DIAGNOSIS — E43 Unspecified severe protein-calorie malnutrition: Secondary | ICD-10-CM | POA: Diagnosis present

## 2023-03-13 DIAGNOSIS — E871 Hypo-osmolality and hyponatremia: Secondary | ICD-10-CM | POA: Diagnosis not present

## 2023-03-13 DIAGNOSIS — M8448XA Pathological fracture, other site, initial encounter for fracture: Secondary | ICD-10-CM | POA: Diagnosis present

## 2023-03-13 DIAGNOSIS — Z043 Encounter for examination and observation following other accident: Secondary | ICD-10-CM | POA: Diagnosis not present

## 2023-03-13 DIAGNOSIS — W19XXXA Unspecified fall, initial encounter: Secondary | ICD-10-CM | POA: Diagnosis present

## 2023-03-13 DIAGNOSIS — R918 Other nonspecific abnormal finding of lung field: Secondary | ICD-10-CM | POA: Diagnosis not present

## 2023-03-13 DIAGNOSIS — R2689 Other abnormalities of gait and mobility: Secondary | ICD-10-CM | POA: Diagnosis not present

## 2023-03-13 DIAGNOSIS — S2243XA Multiple fractures of ribs, bilateral, initial encounter for closed fracture: Secondary | ICD-10-CM | POA: Diagnosis present

## 2023-03-13 DIAGNOSIS — M4856XA Collapsed vertebra, not elsewhere classified, lumbar region, initial encounter for fracture: Secondary | ICD-10-CM | POA: Diagnosis present

## 2023-03-13 DIAGNOSIS — K449 Diaphragmatic hernia without obstruction or gangrene: Secondary | ICD-10-CM | POA: Diagnosis not present

## 2023-03-13 DIAGNOSIS — Z91048 Other nonmedicinal substance allergy status: Secondary | ICD-10-CM

## 2023-03-13 DIAGNOSIS — R41841 Cognitive communication deficit: Secondary | ICD-10-CM | POA: Diagnosis not present

## 2023-03-13 DIAGNOSIS — M25522 Pain in left elbow: Secondary | ICD-10-CM | POA: Diagnosis present

## 2023-03-13 DIAGNOSIS — M6281 Muscle weakness (generalized): Secondary | ICD-10-CM | POA: Diagnosis not present

## 2023-03-13 DIAGNOSIS — I482 Chronic atrial fibrillation, unspecified: Secondary | ICD-10-CM | POA: Diagnosis not present

## 2023-03-13 DIAGNOSIS — S0990XA Unspecified injury of head, initial encounter: Secondary | ICD-10-CM | POA: Diagnosis not present

## 2023-03-13 DIAGNOSIS — R54 Age-related physical debility: Secondary | ICD-10-CM | POA: Diagnosis present

## 2023-03-13 DIAGNOSIS — M4854XA Collapsed vertebra, not elsewhere classified, thoracic region, initial encounter for fracture: Secondary | ICD-10-CM | POA: Diagnosis present

## 2023-03-13 DIAGNOSIS — K219 Gastro-esophageal reflux disease without esophagitis: Secondary | ICD-10-CM | POA: Diagnosis not present

## 2023-03-13 DIAGNOSIS — S32512A Fracture of superior rim of left pubis, initial encounter for closed fracture: Secondary | ICD-10-CM | POA: Diagnosis not present

## 2023-03-13 DIAGNOSIS — I509 Heart failure, unspecified: Secondary | ICD-10-CM

## 2023-03-13 DIAGNOSIS — Z888 Allergy status to other drugs, medicaments and biological substances status: Secondary | ICD-10-CM | POA: Diagnosis not present

## 2023-03-13 DIAGNOSIS — M1712 Unilateral primary osteoarthritis, left knee: Secondary | ICD-10-CM | POA: Diagnosis not present

## 2023-03-13 DIAGNOSIS — M40209 Unspecified kyphosis, site unspecified: Secondary | ICD-10-CM | POA: Diagnosis present

## 2023-03-13 DIAGNOSIS — Z96642 Presence of left artificial hip joint: Secondary | ICD-10-CM | POA: Diagnosis not present

## 2023-03-13 DIAGNOSIS — Z885 Allergy status to narcotic agent status: Secondary | ICD-10-CM | POA: Diagnosis not present

## 2023-03-13 DIAGNOSIS — R9389 Abnormal findings on diagnostic imaging of other specified body structures: Secondary | ICD-10-CM | POA: Diagnosis not present

## 2023-03-13 DIAGNOSIS — Z471 Aftercare following joint replacement surgery: Secondary | ICD-10-CM | POA: Diagnosis not present

## 2023-03-13 DIAGNOSIS — E785 Hyperlipidemia, unspecified: Secondary | ICD-10-CM | POA: Diagnosis not present

## 2023-03-13 DIAGNOSIS — M25562 Pain in left knee: Secondary | ICD-10-CM | POA: Diagnosis not present

## 2023-03-13 DIAGNOSIS — Z79899 Other long term (current) drug therapy: Secondary | ICD-10-CM

## 2023-03-13 DIAGNOSIS — Z8249 Family history of ischemic heart disease and other diseases of the circulatory system: Secondary | ICD-10-CM | POA: Diagnosis not present

## 2023-03-13 DIAGNOSIS — G629 Polyneuropathy, unspecified: Secondary | ICD-10-CM | POA: Diagnosis not present

## 2023-03-13 DIAGNOSIS — Z7401 Bed confinement status: Secondary | ICD-10-CM | POA: Diagnosis not present

## 2023-03-13 DIAGNOSIS — R531 Weakness: Secondary | ICD-10-CM | POA: Diagnosis not present

## 2023-03-13 DIAGNOSIS — K5730 Diverticulosis of large intestine without perforation or abscess without bleeding: Secondary | ICD-10-CM | POA: Diagnosis not present

## 2023-03-13 LAB — CBC WITH DIFFERENTIAL/PLATELET
Abs Immature Granulocytes: 0.03 10*3/uL (ref 0.00–0.07)
Basophils Absolute: 0.1 10*3/uL (ref 0.0–0.1)
Basophils Relative: 1 %
Eosinophils Absolute: 0 10*3/uL (ref 0.0–0.5)
Eosinophils Relative: 0 %
HCT: 31.9 % — ABNORMAL LOW (ref 36.0–46.0)
Hemoglobin: 10.4 g/dL — ABNORMAL LOW (ref 12.0–15.0)
Immature Granulocytes: 0 %
Lymphocytes Relative: 8 %
Lymphs Abs: 0.6 10*3/uL — ABNORMAL LOW (ref 0.7–4.0)
MCH: 30.7 pg (ref 26.0–34.0)
MCHC: 32.6 g/dL (ref 30.0–36.0)
MCV: 94.1 fL (ref 80.0–100.0)
Monocytes Absolute: 0.6 10*3/uL (ref 0.1–1.0)
Monocytes Relative: 9 %
Neutro Abs: 6 10*3/uL (ref 1.7–7.7)
Neutrophils Relative %: 82 %
Platelets: 173 10*3/uL (ref 150–400)
RBC: 3.39 MIL/uL — ABNORMAL LOW (ref 3.87–5.11)
RDW: 14.6 % (ref 11.5–15.5)
WBC: 7.4 10*3/uL (ref 4.0–10.5)
nRBC: 0 % (ref 0.0–0.2)

## 2023-03-13 LAB — COMPREHENSIVE METABOLIC PANEL
ALT: 24 U/L (ref 0–44)
AST: 34 U/L (ref 15–41)
Albumin: 3.2 g/dL — ABNORMAL LOW (ref 3.5–5.0)
Alkaline Phosphatase: 72 U/L (ref 38–126)
Anion gap: 10 (ref 5–15)
BUN: 10 mg/dL (ref 8–23)
CO2: 29 mmol/L (ref 22–32)
Calcium: 8.9 mg/dL (ref 8.9–10.3)
Chloride: 95 mmol/L — ABNORMAL LOW (ref 98–111)
Creatinine, Ser: 0.79 mg/dL (ref 0.44–1.00)
GFR, Estimated: >60 mL/min (ref >60)
Glucose, Bld: 103 mg/dL — ABNORMAL HIGH (ref 70–99)
Potassium: 3.8 mmol/L (ref 3.5–5.1)
Sodium: 134 mmol/L — ABNORMAL LOW (ref 135–145)
Total Bilirubin: 1.3 mg/dL — ABNORMAL HIGH (ref 0.3–1.2)
Total Protein: 6 g/dL — ABNORMAL LOW (ref 6.5–8.1)

## 2023-03-13 LAB — MAGNESIUM: Magnesium: 1.9 mg/dL (ref 1.7–2.4)

## 2023-03-13 LAB — PROTIME-INR
INR: 1.9 — ABNORMAL HIGH (ref 0.8–1.2)
Prothrombin Time: 22.3 seconds — ABNORMAL HIGH (ref 11.4–15.2)

## 2023-03-13 MED ORDER — TRAMADOL HCL 50 MG PO TABS: 50.0000 mg | ORAL_TABLET | ORAL | Status: DC

## 2023-03-13 MED ORDER — VITAMIN D 25 MCG (1000 UNIT) PO TABS
1000.0000 [IU] | ORAL_TABLET | Freq: Every day | ORAL | Status: DC
  Administered 2023-03-14 – 2023-03-18 (×5): 1000 [IU] via ORAL
  Filled 2023-03-13 (×5): qty 1

## 2023-03-13 MED ORDER — ATORVASTATIN CALCIUM 10 MG PO TABS
10.0000 mg | ORAL_TABLET | Freq: Every day | ORAL | Status: DC
  Administered 2023-03-14 – 2023-03-18 (×5): 10 mg via ORAL
  Filled 2023-03-13 (×5): qty 1

## 2023-03-13 MED ORDER — METOPROLOL TARTRATE 50 MG PO TABS
50.0000 mg | ORAL_TABLET | Freq: Two times a day (BID) | ORAL | Status: DC
  Administered 2023-03-14 – 2023-03-17 (×7): 50 mg via ORAL
  Filled 2023-03-13 (×8): qty 1

## 2023-03-13 MED ORDER — ONDANSETRON HCL 4 MG/2ML IJ SOLN: 4.0000 mg | Freq: Four times a day (QID) | INTRAMUSCULAR | Status: DC | PRN

## 2023-03-13 MED ORDER — TRAMADOL HCL 50 MG PO TABS
50.0000 mg | ORAL_TABLET | Freq: Three times a day (TID) | ORAL | Status: DC
  Administered 2023-03-14 – 2023-03-18 (×12): 50 mg via ORAL
  Filled 2023-03-13 (×13): qty 1

## 2023-03-13 MED ORDER — ACETAMINOPHEN 325 MG PO TABS: 650.0000 mg | ORAL_TABLET | Freq: Four times a day (QID) | ORAL | Status: DC | PRN

## 2023-03-13 MED ORDER — ADULT MULTIVITAMIN W/MINERALS CH
1.0000 | ORAL_TABLET | Freq: Every day | ORAL | Status: DC
  Administered 2023-03-14 – 2023-03-18 (×5): 1 via ORAL
  Filled 2023-03-13 (×5): qty 1

## 2023-03-13 MED ORDER — ACETAMINOPHEN 650 MG RE SUPP: 650.0000 mg | Freq: Four times a day (QID) | RECTAL | Status: DC | PRN

## 2023-03-13 MED ORDER — ONDANSETRON HCL 4 MG PO TABS: 4.0000 mg | ORAL_TABLET | Freq: Four times a day (QID) | ORAL | Status: DC | PRN

## 2023-03-13 MED ORDER — PREDNISONE 5 MG PO TABS
5.0000 mg | ORAL_TABLET | Freq: Every day | ORAL | Status: DC
  Administered 2023-03-14 – 2023-03-18 (×5): 5 mg via ORAL
  Filled 2023-03-13 (×5): qty 1

## 2023-03-13 MED ORDER — SERTRALINE HCL 100 MG PO TABS
100.0000 mg | ORAL_TABLET | Freq: Every day | ORAL | Status: DC
  Administered 2023-03-14 – 2023-03-18 (×5): 100 mg via ORAL
  Filled 2023-03-13 (×5): qty 1

## 2023-03-13 MED ORDER — WARFARIN SODIUM 5 MG PO TABS
5.0000 mg | ORAL_TABLET | Freq: Once | ORAL | Status: DC
  Filled 2023-03-13: qty 1

## 2023-03-13 MED ORDER — WARFARIN - PHARMACIST DOSING INPATIENT: Freq: Every day | Status: DC

## 2023-03-13 MED ORDER — LORATADINE 10 MG PO TABS
10.0000 mg | ORAL_TABLET | Freq: Every day | ORAL | Status: DC
  Administered 2023-03-14 – 2023-03-18 (×5): 10 mg via ORAL
  Filled 2023-03-13 (×5): qty 1

## 2023-03-13 MED ORDER — GABAPENTIN 400 MG PO CAPS
400.0000 mg | ORAL_CAPSULE | Freq: Every day | ORAL | Status: DC
  Administered 2023-03-14 – 2023-03-18 (×5): 400 mg via ORAL
  Filled 2023-03-13 (×5): qty 1

## 2023-03-13 MED ORDER — PANTOPRAZOLE SODIUM 40 MG PO TBEC
40.0000 mg | DELAYED_RELEASE_TABLET | Freq: Every day | ORAL | Status: DC
  Administered 2023-03-14 – 2023-03-18 (×5): 40 mg via ORAL
  Filled 2023-03-13 (×5): qty 1

## 2023-03-13 NOTE — ED Provider Notes (Signed)
Care of patient received from prior provider at 4:18 PM, please see their note for complete H/P and care plan.  Received handoff per ED course.  Clinical Course as of 03/13/23 1618  Thu Mar 13, 2023  1617 Stable HO from [facility] L2 trauma fall on coumadin. Having left hip pain. XR nondiagnostic. Pain is too severe to ambulate. Getting CT hip and pain meds. Possible PTOT vs Medical admission. [CC]    Clinical Course User Index [CC] Glyn Ade, MD    Reassessment: Reassessed at bedside.  CT scan shows a superior pubic rami fracture.  Given that the fracture is near prosthesis, communicate with orthopedics.  They agreed with weightbearing as tolerated and medical admission for physical therapy with 4-week repeat x-ray to ensure appropriate alignment. Disposition:   Based on the above findings, I believe this patient is stable for admission.    Patient/family educated about specific findings on our evaluation and explained exact reasons for admission.  Patient/family educated about clinical situation and time was allowed to answer questions.   Admission team communicated with and agreed with need for admission. Patient admitted. Patient ready to move at this time.     Emergency Department Medication Summary:   Medications  predniSONE (DELTASONE) tablet 5 mg (has no administration in time range)  gabapentin (NEURONTIN) capsule 400 mg (has no administration in time range)  metoprolol tartrate (LOPRESSOR) tablet 50 mg (50 mg Oral Patient Refused/Not Given 03/13/23 2101)  loratadine (CLARITIN) tablet 10 mg (has no administration in time range)  atorvastatin (LIPITOR) tablet 10 mg (has no administration in time range)  sertraline (ZOLOFT) tablet 100 mg (100 mg Oral Patient Refused/Not Given 03/13/23 2101)  cholecalciferol (VITAMIN D3) 25 MCG (1000 UNIT) tablet 1,000 Units (has no administration in time range)  pantoprazole (PROTONIX) EC tablet 40 mg (40 mg Oral Patient Refused/Not Given 03/13/23  2101)  multivitamin with minerals tablet 1 tablet (has no administration in time range)  acetaminophen (TYLENOL) tablet 650 mg (has no administration in time range)    Or  acetaminophen (TYLENOL) suppository 650 mg (has no administration in time range)  ondansetron (ZOFRAN) tablet 4 mg (has no administration in time range)    Or  ondansetron (ZOFRAN) injection 4 mg (has no administration in time range)  traMADol (ULTRAM) tablet 50 mg (has no administration in time range)  warfarin (COUMADIN) tablet 5 mg (has no administration in time range)  Warfarin - Pharmacist Dosing Inpatient (has no administration in time range)          Glyn Ade, MD 03/13/23 2243

## 2023-03-13 NOTE — Progress Notes (Signed)
Patient's daughter, [name], helps test INR with home [employer] analyzer and submitted results via portal. Testing was  performed last night and is 2.0. Continue 1/2 tablet daily except take 1 tablet on Thursdays. Recheck in 2 weeks, on 03/26/23. LVM for pt with dosing instructions and recheck in 2 weeks. LVM for pt's daughter, [name], also.

## 2023-03-13 NOTE — ED Notes (Signed)
Pericare performed, pt placed on purewick for pubic rami fracture on the left  Pt personal clean brief applied.

## 2023-03-13 NOTE — ED Provider Notes (Addendum)
[facility] EMERGENCY [name] AT [facility] Provider Note   CSN: [name] Arrival date & time: 03/13/23  1207     History  Chief Complaint  Stacey presents with   [facility] Stacey [name] is a 89 y.o. female.  HPI   89 year old female presents emergency [name] with mechanical fall.  History of atrial fibrillation and anticoagulated on Coumadin, compliant with medication.  Stacey states today Stacey [name] felt like Stacey [name] was going to have an urgent bowel movement/diarrhea so Stacey [name] tried to turn quickly to get to the bathroom and this is what resulted in Stacey [name] fall.  Stacey [name] fell down onto Stacey [name] left side.  Stacey [name] did hit Stacey [name] head.  There was no loss of consciousness tonight.  No preceding chest pain, palpitations or shortness of breath.  Stacey [name] is currently complaining of left-sided head, left elbow, left hip and left knee pain from the fall.  Stacey [name] has otherwise been well and at Stacey [name] baseline.  INR done yesterday showed a level of 2.0.  In regards to the diarrhea this was an isolated episode, no ongoing abdominal pain, diarrhea was nonbloody.  Home Medications Prior to Admission medications   Medication Sig Start Date End Date Taking? Authorizing Provider  acetaminophen (TYLENOL) 500 MG tablet Take 500 mg by mouth every 6 (six) hours as needed. WITH TRAMADOL    [provider]  atorvastatin (LIPITOR) 10 MG tablet TAKE 1 TABLET DAILY 03/05/23   [name], MD  cyanocobalamin 1000 MCG tablet Take 1,000 mcg by mouth daily.    [provider]  fexofenadine (ALLEGRA ALLERGY) 60 MG tablet Take 1 tablet (60 mg total) by mouth daily as needed for allergies or rhinitis. 04/25/22   [name], MD  gabapentin (NEURONTIN) 300 MG capsule Take 1 capsule by mouth daily.    [provider]  gabapentin (NEURONTIN) 400 MG capsule TAKE 1 CAPSULE DAILY 08/22/22   [name], MD  hydrocortisone 1 % ointment Apply 1 Application topically 2 (two) times daily. 04/25/22    [name], MD  magic mouthwash w/lidocaine SOLN Take 5 mLs by mouth 4 (four) times daily as needed for mouth pain. 04/25/22   [name], MD  meclizine (ANTIVERT) 12.5 MG tablet Take 1 tablet (12.5 mg total) by mouth 3 (three) times daily as needed for dizziness. 06/08/21   [name], MD  metoprolol tartrate (LOPRESSOR) 100 MG tablet Take 1 tablet by mouth. 02/11/18   [provider]  metoprolol tartrate (LOPRESSOR) 50 MG tablet TAKE 1 TABLET TWICE A DAY 05/06/22   [name], MD  Multiple Vitamin (MULTIVITAMIN) capsule Take 1 capsule by mouth daily.    [provider]  mupirocin cream (BACTROBAN) 2 % Apply fingertip amount to wound area daily. 04/24/21   Park Liter, DPM  omeprazole (PRILOSEC) 20 MG capsule 1 capsule 30 minutes before morning meal    [provider]  potassium chloride SA (KLOR-CON M) 20 MEQ tablet TAKE 1 TABLET BY MOUTH DAILY 06/03/22   Sharlene Dory, PA-C  prednisoLONE 5 MG TABS tablet Take 1 tablet (5 mg total) by mouth daily. 09/06/22   [name], MD  predniSONE (DELTASONE) 5 MG tablet Take 5 mg by mouth daily.    [provider]  sertraline (ZOLOFT) 100 MG tablet TAKE 1 TABLET DAILY 04/08/22   [name], MD  spironolactone (ALDACTONE) 25 MG tablet Take 0.5 tablets (12.5 mg total) by mouth daily. 04/26/22  [name], MD  torsemide (DEMADEX) 20 MG tablet TAKE 2 TABLETS DAILY 12/19/22   [name], MD  traMADol (ULTRAM) 50 MG tablet TAKE 2 TABLETS TWICE A DAY AS NEEDED 09/11/22   [name], MD  Vitamin D, Cholecalciferol, 25 MCG (1000 UT) TABS Take 1,000 Units by mouth daily.    [provider]  warfarin (COUMADIN) 5 MG tablet TAKE 1/2 TABLET BY MOUTH DAILY EXCEPT TAKE 1 TABLET ON TUESDAYS, THURSDAYS AND SATURDAYS OR AS DIRECTED BY ANTICOAGULATION CLINIC 12/06/22   [name], MD      Allergies    Sulfa antibiotics, Tape, Codeine, Hydroxychloroquine, Shellfish-derived  products, Latex, Neosporin original [bacitracin-neomycin-polymyxin], and Polysporin [bacitracin-polymyxin b]    Review of Systems   Review of Systems  Constitutional:  Negative for fever.  Respiratory:  Negative for shortness of breath.   Cardiovascular:  Negative for chest pain, palpitations and leg swelling.  Gastrointestinal:  Negative for abdominal pain, diarrhea and vomiting.  Musculoskeletal:        Left-sided head, elbow, hip and knee pain  Skin:  Negative for rash.  Neurological:  Positive for dizziness. Negative for syncope, facial asymmetry, speech difficulty, weakness, numbness and headaches.    Physical Exam Updated Vital Signs BP 112/80 (BP Location: Right Arm)   Pulse (!) 101   Temp 97.7 F (36.5 C) (Oral)   Resp 17   SpO2 96%  Physical Exam Vitals and nursing note reviewed.  Constitutional:      Appearance: Normal appearance.  HENT:     Head: Normocephalic.     Comments: Small hematoma left parietal, no laceration    Mouth/Throat:     Mouth: Mucous membranes are moist.  Eyes:     Extraocular Movements: Extraocular movements intact.  Cardiovascular:     Rate and Rhythm: Normal rate.  Pulmonary:     Effort: Pulmonary effort is normal. No respiratory distress.  Abdominal:     Palpations: Abdomen is soft.     Tenderness: There is no abdominal tenderness.  Musculoskeletal:     Cervical back: No rigidity or tenderness.     Comments: Ecchymosis to the left elbow, mild tenderness to palpation of the left hip without gross deformity, legs are equal length, mild tenderness to palpation of the left knee with some small amount of bruising, no swelling  Skin:    General: Skin is warm.  Neurological:     Mental Status: Stacey [name] is alert and oriented to person, place, and time. Mental status is at baseline.  Psychiatric:        Mood and Affect: Mood normal.     ED Results / Procedures / Treatments   Labs (all labs ordered are listed, but only abnormal results are  displayed) Labs Reviewed - No data to display  EKG None  Radiology CT Head Wo Contrast  Result Date: 03/13/2023 CLINICAL DATA:  Fall, Coumadin EXAM: CT HEAD WITHOUT CONTRAST CT CERVICAL SPINE WITHOUT CONTRAST TECHNIQUE: Multidetector CT imaging of the head and cervical spine was performed following the standard protocol without intravenous contrast. Multiplanar CT image reconstructions of the cervical spine were also generated. RADIATION DOSE REDUCTION: This exam was performed according to the departmental dose-optimization program which includes automated exposure control, adjustment of the mA and/or kV according to Stacey size and/or use of iterative reconstruction technique. COMPARISON:  None Available. FINDINGS: CT HEAD FINDINGS Brain: No evidence of acute infarction, hemorrhage, hydrocephalus, extra-axial collection or mass lesion/mass effect. Periventricular white matter hypodensity. Vascular: No  hyperdense vessel or unexpected calcification. Skull: Normal. Negative for fracture or focal lesion. Sinuses/Orbits: No acute finding. Other: None. CT CERVICAL SPINE FINDINGS Alignment: Normal. Skull base and vertebrae: Osteopenia. No acute fracture. No primary bone lesion or focal pathologic process. Soft tissues and spinal canal: No prevertebral fluid or swelling. No visible canal hematoma. Disc levels: Generally mild multilevel cervical disc space height loss and osteophytosis. Upper chest: Negative. Other: None. IMPRESSION: 1. No acute intracranial pathology. Small-vessel white matter disease. 2. Osteopenia. No fracture or static subluxation of the cervical spine. 3. Generally mild multilevel cervical disc degenerative disease. Electronically Signed   By: Jearld Lesch M.D.   On: 03/13/2023 13:23   CT Cervical Spine Wo Contrast  Result Date: 03/13/2023 CLINICAL DATA:  Fall, Coumadin EXAM: CT HEAD WITHOUT CONTRAST CT CERVICAL SPINE WITHOUT CONTRAST TECHNIQUE: Multidetector CT imaging of the head and  cervical spine was performed following the standard protocol without intravenous contrast. Multiplanar CT image reconstructions of the cervical spine were also generated. RADIATION DOSE REDUCTION: This exam was performed according to the departmental dose-optimization program which includes automated exposure control, adjustment of the mA and/or kV according to Stacey size and/or use of iterative reconstruction technique. COMPARISON:  None Available. FINDINGS: CT HEAD FINDINGS Brain: No evidence of acute infarction, hemorrhage, hydrocephalus, extra-axial collection or mass lesion/mass effect. Periventricular white matter hypodensity. Vascular: No hyperdense vessel or unexpected calcification. Skull: Normal. Negative for fracture or focal lesion. Sinuses/Orbits: No acute finding. Other: None. CT CERVICAL SPINE FINDINGS Alignment: Normal. Skull base and vertebrae: Osteopenia. No acute fracture. No primary bone lesion or focal pathologic process. Soft tissues and spinal canal: No prevertebral fluid or swelling. No visible canal hematoma. Disc levels: Generally mild multilevel cervical disc space height loss and osteophytosis. Upper chest: Negative. Other: None. IMPRESSION: 1. No acute intracranial pathology. Small-vessel white matter disease. 2. Osteopenia. No fracture or static subluxation of the cervical spine. 3. Generally mild multilevel cervical disc degenerative disease. Electronically Signed   By: Jearld Lesch M.D.   On: 03/13/2023 13:23   DG Pelvis Portable  Result Date: 03/13/2023 CLINICAL DATA:  Fall EXAM: PORTABLE PELVIS 1-2 VIEWS COMPARISON:  None Available. FINDINGS: Marked osteopenia. No obvious displaced fracture or dislocation of the pelvis or proximal right femur. Status post left hip total arthroplasty. No obvious perihardware fracture. Nonobstructive pattern of overlying bowel gas. Vascular calcinosis. IMPRESSION: 1. Marked osteopenia. No obvious displaced fracture or dislocation of the pelvis  or proximal right femur. 2. Status post left hip total arthroplasty. No obvious perihardware fracture. 3. Plain radiographs are significantly limited in sensitivity for hip and pelvic fracture. Recommend CT if there is clinical suspicion for fracture. Electronically Signed   By: Jearld Lesch M.D.   On: 03/13/2023 13:17   DG Chest Portable 1 View  Result Date: 03/13/2023 CLINICAL DATA:  fall EXAM: PORTABLE CHEST 1 VIEW COMPARISON:  Chest radiograph 12/11/2022 FINDINGS: Asymmetric elevation of the right hemidiaphragm, unchanged from prior exam. Pleural effusion. Pneumothorax. Redemonstrated are multiple subacute to chronic bilateral rib fractures. No definite evidence of an acute displaced rib fracture. Cardiomegaly. Hazy bibasilar airspace opacities could represent atelectasis or infection. Visualized upper abdomen is unremarkable IMPRESSION: 1. Hazy bibasilar airspace opacities could represent atelectasis or infection. 2. Redemonstrated are multiple subacute to chronic bilateral rib fractures. No definite evidence of an acute displaced rib fracture. Electronically Signed   By: Lorenza Cambridge M.D.   On: 03/13/2023 13:11    Procedures .Critical Care  Performed by: Rozelle Logan, DO Authorized  by: Rozelle Logan, DO   Critical care provider statement:    Critical care time (minutes):  30   Critical care time was exclusive of:  Separately billable procedures and treating other patients   Critical care was necessary to treat or prevent imminent or life-threatening deterioration of the following conditions:  Trauma   Critical care was time spent personally by me on the following activities:  Development of treatment plan with Stacey or surrogate, discussions with consultants, evaluation of Stacey's response to treatment, examination of Stacey, ordering and review of laboratory studies, ordering and review of radiographic studies, ordering and performing treatments and interventions, pulse oximetry,  re-evaluation of Stacey's condition and review of old charts   I assumed direction of critical care for this Stacey from another provider in my specialty: no     Care discussed with: admitting provider       Medications Ordered in ED Medications - No data to display  ED Course/ Medical Decision Making/ A&P Clinical Course as of 03/13/23 1633  Thu Mar 13, 2023  1617 Stable HO from [facility] L2 trauma fall on coumadin. Having left hip pain. XR nondiagnostic. Pain is too severe to ambulate. Getting CT hip and pain meds. Possible PTOT vs Medical admission. [CC]    Clinical Course User Index [CC] Glyn Ade, MD                                 Medical Decision Making Amount and/or Complexity of Data Reviewed Radiology: ordered.   89 year old female presents emergency [name] as a level 2 trauma, fall on blood thinners.  Stacey [name] is on Coumadin, last INR yesterday was 2.0.  Stacey [name] is being followed closely by the clinic and they are monitoring and adjusting Stacey [name] medications.  Stacey [name] is currently complaining of left-sided head pain, left elbow, left hip and left knee pain.  EKG shows atrial fibrillation which is baseline for the Stacey.  Vitals are otherwise stable.  There was mention of 1 episode of diarrhea this morning that contributed to the fall and Stacey [name] trying to get to the bathroom urgently.  This was 1 isolated episode.  Nonbloody.  No ongoing abdominal pain or fever.  X-ray imaging and CT imaging is negative from a traumatic standpoint.  Chest x-ray mentions atelectasis versus infiltrates.  Stacey [name] has no cough or fever, doubt pneumonia.  However when Stacey was ambulated Stacey [name] continued to have bilateral hip pain, worse on the left.  For this reason we will pursue CT imaging.  Stacey signed out pending CT imaging, Stacey signed out to Dr. Doran Durand.        Final Clinical Impression(s) / ED Diagnoses Final diagnoses:  None    Rx / DC Orders ED Discharge Orders     None          Rozelle Logan, DO 03/13/23 1634    Mikailah Morel, Clabe Seal, DO 03/13/23 1639

## 2023-03-13 NOTE — Assessment & Plan Note (Addendum)
Cont coumadin per pharm consult Cont BID metoprolol

## 2023-03-13 NOTE — Telephone Encounter (Signed)
Stacey [name] with [facility] [phone]  Called to inform MD: Pt had a fall - injured her left hip and left leg Being sent to ED  Please F/U with Daughter [facility]) - if necessary

## 2023-03-13 NOTE — Progress Notes (Signed)
Orthopedic Tech Progress Note Patient Details:  Stacey [name] [birth year] [id number]  Level II trauma, ortho techs not needed at this time.  Patient ID: [name], female   DOB: March 10, 1933, 89 y.o.   MRN: [id number]  Docia Furl 03/13/2023, 4:52 PM

## 2023-03-13 NOTE — Progress Notes (Signed)
ANTICOAGULATION CONSULT NOTE  Pharmacy Consult for Warfarin Indication: atrial fibrillation  Allergies  Allergen Reactions   Sulfa Antibiotics Anaphylaxis   Tape Other (See Comments)    BRUISES VERY EASILY!!!!   Codeine Nausea And Vomiting   Hydroxychloroquine     Other reaction(s): GI upset   Shellfish-Derived Products Other (See Comments)    Daughter was unsure of the reaction- "happened years ago" and patient avoids foods that contain shellfish   Latex Rash   Neosporin Original [Bacitracin-Neomycin-Polymyxin] Rash   Polysporin [Bacitracin-Polymyxin B] Rash    Patient Measurements:    Vital Signs: Temp: 98.8 F (37.1 C) (08/01 2130) Temp Source: Oral (08/01 2130) BP: 112/78 (08/01 2130) Pulse Rate: 100 (08/01 2130)  Labs: Recent Labs    03/12/23 0000 03/13/23 2101  HGB  --  10.4*  HCT  --  31.9*  PLT  --  173  LABPROT  --  22.3*  INR 2.0 1.9*  CREATININE  --  0.79    CrCl cannot be calculated (Unknown ideal weight.).   Medical History: Past Medical History:  Diagnosis Date   Atrial fibrillation (HCC)    Hernia of abdominal cavity    Kyphosis     Assessment: 63 YOF admitted with hip fracture planning non-operative management. On warfarin PTA for atrial fibrillation. Pharmacy consulted to dose while inpatient  PTA regimen 5mg  Thurs and 2.5mg  all other days  INR 2.0 - last dose 7/31 PM  Goal of Therapy:  INR 2-3 Monitor platelets by anticoagulation protocol: Yes   Plan:  Warfarin 5mg  PO once Daily INR  Eldridge Scot, PharmD Clinical Pharmacist 03/13/2023, 10:03 PM

## 2023-03-13 NOTE — Assessment & Plan Note (Signed)
BP a bit on soft side this evening, holding aldactone and torsemide for the moment.

## 2023-03-13 NOTE — ED Notes (Signed)
ED TO INPATIENT HANDOFF REPORT  ED Nurse Name and Phone #: Court Gracia RN   S Name/Age/Gender Stacey [name] 89 y.o. female Room/Bed: 004C/004C  Code Status   Code Status: Full Code  Home/SNF/Other Home Patient oriented to: self, place, time, and situation Is this baseline? Yes   Triage Complete: Triage complete  Chief Complaint Closed fracture of left superior pubic ramus (HCC) [S32.512A]  Triage Note TRIAGE NOTE:   Patient arrives by EMS from [place] with c/o fall.   Patient fell around at around 0930, patient is on coumadin.   Patient reports dizziness before fall, does have history of vertigo.  No LOC patient reports pain to left elbow, hip and knee.   Small hematoma noted to top of head, that pt reports hit the dresser.      EMS VITALS CBG 135 HR 110's Afib (history of Afib) 110/74   Allergies Allergies  Allergen Reactions   Sulfa Antibiotics Anaphylaxis   Tape Other (See Comments)    BRUISES VERY EASILY!!!!   Codeine Nausea And Vomiting   Hydroxychloroquine     Other reaction(s): GI upset   Shellfish-Derived Products Other (See Comments)    Daughter was unsure of the reaction- "happened years ago" and patient avoids foods that contain shellfish   Latex Rash   Neosporin Original [Bacitracin-Neomycin-Polymyxin] Rash   Polysporin [Bacitracin-Polymyxin B] Rash    Level of Care/Admitting Diagnosis ED Disposition     ED Disposition  Admit   Condition  --   Comment  Hospital Area: [facility] [[id number]]  Level of Care: Telemetry Medical [[id number]]  May place patient in observation at [facility] or [facility] if equivalent level of care is available:: No  Covid Evaluation: Asymptomatic - no recent exposure (last 10 days) testing not required  Diagnosis: Closed fracture of left superior pubic ramus [facility]) [[id number]]  Admitting Physician: Hillary Bow [[id number]]  Attending Physician: Hillary Bow [[id number]]          B Medical/Surgery  History Past Medical History:  Diagnosis Date   Atrial fibrillation (HCC)    Hernia of abdominal cavity    Kyphosis    Past Surgical History:  Procedure Laterality Date   ABDOMINAL HYSTERECTOMY     CATARACT EXTRACTION, BILATERAL     TONSILLECTOMY       A IV Location/Drains/Wounds Patient Lines/Drains/Airways Status     Active Line/Drains/Airways     None            Intake/Output Last 24 hours No intake or output data in the 24 hours ending 03/13/23 2152  Labs/Imaging Results for orders placed or performed during the hospital encounter of 03/13/23 (from the past 48 hour(s))  CBC with Differential/Platelet     Status: Abnormal   Collection Time: 03/13/23  9:01 PM  Result Value Ref Range   WBC 7.4 4.0 - 10.5 K/uL   RBC 3.39 (L) 3.87 - 5.11 MIL/uL   Hemoglobin 10.4 (L) 12.0 - 15.0 g/dL   HCT 45.4 (L) 09.8 - 11.9 %   MCV 94.1 80.0 - 100.0 fL   MCH 30.7 26.0 - 34.0 pg   MCHC 32.6 30.0 - 36.0 g/dL   RDW 14.7 82.9 - 56.2 %   Platelets 173 150 - 400 K/uL   nRBC 0.0 0.0 - 0.2 %   Neutrophils Relative % 82 %   Neutro Abs 6.0 1.7 - 7.7 K/uL   Lymphocytes Relative 8 %   Lymphs Abs 0.6 (L) 0.7 - 4.0  K/uL   Monocytes Relative 9 %   Monocytes Absolute 0.6 0.1 - 1.0 K/uL   Eosinophils Relative 0 %   Eosinophils Absolute 0.0 0.0 - 0.5 K/uL   Basophils Relative 1 %   Basophils Absolute 0.1 0.0 - 0.1 K/uL   Immature Granulocytes 0 %   Abs Immature Granulocytes 0.03 0.00 - 0.07 K/uL    Comment: Performed at [facility], [address], [place], [place] [address]  Protime-INR     Status: Abnormal   Collection Time: 03/13/23  9:01 PM  Result Value Ref Range   Prothrombin Time 22.3 (H) 11.4 - 15.2 seconds   INR 1.9 (H) 0.8 - 1.2    Comment: (NOTE) INR goal varies based on device and disease states. Performed at [facility], [address], [place], [place] [address]    CT NO CHARGE  Result Date: 03/13/2023 CLINICAL DATA:  Hip pain, stress fracture  suspected. EXAM: CT ADDITIONAL VIEWS AT NO CHARGE TECHNIQUE: Axial, coronal and sagittal osseous reformats of the pelvis were submitted from CT abdomen and pelvis. CONTRAST:  No additional COMPARISON:  CT abdomen and pelvis 03/13/2023 FINDINGS: Osseous: The bones are diffusely osteopenic. There is an acute nondisplaced fracture of the left superior pubic ramus. There is no evidence for dislocation. There also findings suspicious for nondisplaced fracture of the anterior left sacral ala. There is mild asymmetric widening of the left sacroiliac joint anteriorly measuring up to 5 mm (right-sided measures 2 mm). There is a left hip total arthroplasty in anatomic alignment. No evidence for pubic diastasis. There are moderate degenerative changes of the right hip with joint space narrowing and osteophyte formation. There is mild compression deformity of the superior endplate of L5 which is favored as chronic. Soft tissues: No focal soft tissue hematoma identified. Peripheral vascular calcifications are present. IMPRESSION: 1. Acute nondisplaced fracture of the left superior pubic ramus. 2. Findings suspicious for acute nondisplaced fracture of the anterior left sacral ala. 3. Mild asymmetric widening of the left sacroiliac joint anteriorly. 4. Left hip total arthroplasty in anatomic alignment. 5. Moderate degenerative changes of the right hip. 6. Mild compression deformity of the superior endplate of L5 is favored as chronic. Electronically Signed   By: Darliss Cheney M.D.   On: 03/13/2023 19:03   CT ABDOMEN PELVIS WO CONTRAST  Result Date: 03/13/2023 CLINICAL DATA:  Diarrhea EXAM: CT ABDOMEN AND PELVIS WITHOUT CONTRAST TECHNIQUE: Multidetector CT imaging of the abdomen and pelvis was performed following the standard protocol without IV contrast. RADIATION DOSE REDUCTION: This exam was performed according to the departmental dose-optimization program which includes automated exposure control, adjustment of the mA and/or  kV according to patient size and/or use of iterative reconstruction technique. COMPARISON:  MRI lumbar spine 02/20/2021. Lumbar spine x-ray 05/11/2021. FINDINGS: Lower chest: There is atelectasis or scarring in the lung bases. The heart is enlarged. Hepatobiliary: No focal liver abnormality is seen. No gallstones, gallbladder wall thickening, or biliary dilatation. Pancreas: Unremarkable. No pancreatic ductal dilatation or surrounding inflammatory changes. Spleen: Normal in size without focal abnormality. Adrenals/Urinary Tract: Adrenal glands are unremarkable. Kidneys are normal, without renal calculi, focal lesion, or hydronephrosis. Bladder is unremarkable. Stomach/Bowel: There is a large hiatal hernia containing the proximal stomach. The stomach is nondilated. No evidence of bowel wall thickening, distention, or inflammatory changes. The appendix is not seen. There is sigmoid colon diverticulosis. Vascular/Lymphatic: There severe atherosclerotic calcifications of the aorta and branch vessels. Peripheral vascular calcifications are present. No evidence  for abdominal aortic aneurysm. No enlarged lymph nodes are seen. Reproductive: Status post hysterectomy. No adnexal masses. Other: The bones are diffusely osteopenic. Musculoskeletal: The bones are markedly osteopenic. There is levoconvex scoliosis of the lumbar spine with multilevel degenerative change. There are compression deformities of T10 and T11 which are incompletely imaged favored as chronic. There is also mild compression deformity of the superior endplate of L5 which is new from 2022, but favored as chronic. Recommend correlation with point tenderness. Left hip arthroplasty is present. There is an acute nondisplaced fracture of the left superior pubic ramus. IMPRESSION: 1. Acute nondisplaced fracture of the left superior pubic ramus. 2. Compression deformities of T10, T11, and L5 are favored as chronic. Recommend correlation with point tenderness. 3.  Large hiatal hernia. 4. Cardiomegaly. 5. Sigmoid colon diverticulosis. Electronically Signed   By: Darliss Cheney M.D.   On: 03/13/2023 18:10   DG Knee Complete 4 Views Left  Result Date: 03/13/2023 CLINICAL DATA:  Pain after fall EXAM: LEFT KNEE - COMPLETE 4 VIEW COMPARISON:  None Available. FINDINGS: Osteopenia. No fracture or dislocation. There are some joint space loss of the lateral compartment. Small osteophytes. Small osteophytes along the patellofemoral joint. Chondrocalcinosis. Extensive vascular calcifications are seen. No joint effusion on lateral view. IMPRESSION: Severe osteopenia.  Degenerative changes.  Chondrocalcinosis. Electronically Signed   By: Karen Kays M.D.   On: 03/13/2023 14:01   DG Hip Unilat W or Wo Pelvis 2-3 Views Left  Result Date: 03/13/2023 CLINICAL DATA:  Left hip pain after fall. EXAM: DG HIP (WITH OR WITHOUT PELVIS) 2-3V LEFT COMPARISON:  None Available. FINDINGS: Status post left total hip arthroplasty. Diffuse osteopenia is noted. No definite fracture or dislocation is noted. Vascular calcifications are noted. IMPRESSION: No definite acute abnormality seen. Electronically Signed   By: Lupita Raider M.D.   On: 03/13/2023 13:59   DG Elbow Complete Left  Result Date: 03/13/2023 CLINICAL DATA:  Pain after fall EXAM: LEFT ELBOW - COMPLETE 4 VIEW COMPARISON:  None Available. FINDINGS: Osteopenia. No fracture or dislocation. Preserved joint spaces. No joint effusion seen on lateral view. IV site along the antecubital region. IMPRESSION: Osteopenia.  No acute osseous abnormality. Electronically Signed   By: Karen Kays M.D.   On: 03/13/2023 13:56   CT Head Wo Contrast  Result Date: 03/13/2023 CLINICAL DATA:  Fall, Coumadin EXAM: CT HEAD WITHOUT CONTRAST CT CERVICAL SPINE WITHOUT CONTRAST TECHNIQUE: Multidetector CT imaging of the head and cervical spine was performed following the standard protocol without intravenous contrast. Multiplanar CT image reconstructions of the  cervical spine were also generated. RADIATION DOSE REDUCTION: This exam was performed according to the departmental dose-optimization program which includes automated exposure control, adjustment of the mA and/or kV according to patient size and/or use of iterative reconstruction technique. COMPARISON:  None Available. FINDINGS: CT HEAD FINDINGS Brain: No evidence of acute infarction, hemorrhage, hydrocephalus, extra-axial collection or mass lesion/mass effect. Periventricular white matter hypodensity. Vascular: No hyperdense vessel or unexpected calcification. Skull: Normal. Negative for fracture or focal lesion. Sinuses/Orbits: No acute finding. Other: None. CT CERVICAL SPINE FINDINGS Alignment: Normal. Skull base and vertebrae: Osteopenia. No acute fracture. No primary bone lesion or focal pathologic process. Soft tissues and spinal canal: No prevertebral fluid or swelling. No visible canal hematoma. Disc levels: Generally mild multilevel cervical disc space height loss and osteophytosis. Upper chest: Negative. Other: None. IMPRESSION: 1. No acute intracranial pathology. Small-vessel white matter disease. 2. Osteopenia. No fracture or static subluxation of the cervical spine.  3. Generally mild multilevel cervical disc degenerative disease. Electronically Signed   By: Jearld Lesch M.D.   On: 03/13/2023 13:23   CT Cervical Spine Wo Contrast  Result Date: 03/13/2023 CLINICAL DATA:  Fall, Coumadin EXAM: CT HEAD WITHOUT CONTRAST CT CERVICAL SPINE WITHOUT CONTRAST TECHNIQUE: Multidetector CT imaging of the head and cervical spine was performed following the standard protocol without intravenous contrast. Multiplanar CT image reconstructions of the cervical spine were also generated. RADIATION DOSE REDUCTION: This exam was performed according to the departmental dose-optimization program which includes automated exposure control, adjustment of the mA and/or kV according to patient size and/or use of iterative  reconstruction technique. COMPARISON:  None Available. FINDINGS: CT HEAD FINDINGS Brain: No evidence of acute infarction, hemorrhage, hydrocephalus, extra-axial collection or mass lesion/mass effect. Periventricular white matter hypodensity. Vascular: No hyperdense vessel or unexpected calcification. Skull: Normal. Negative for fracture or focal lesion. Sinuses/Orbits: No acute finding. Other: None. CT CERVICAL SPINE FINDINGS Alignment: Normal. Skull base and vertebrae: Osteopenia. No acute fracture. No primary bone lesion or focal pathologic process. Soft tissues and spinal canal: No prevertebral fluid or swelling. No visible canal hematoma. Disc levels: Generally mild multilevel cervical disc space height loss and osteophytosis. Upper chest: Negative. Other: None. IMPRESSION: 1. No acute intracranial pathology. Small-vessel white matter disease. 2. Osteopenia. No fracture or static subluxation of the cervical spine. 3. Generally mild multilevel cervical disc degenerative disease. Electronically Signed   By: Jearld Lesch M.D.   On: 03/13/2023 13:23   DG Pelvis Portable  Result Date: 03/13/2023 CLINICAL DATA:  Fall EXAM: PORTABLE PELVIS 1-2 VIEWS COMPARISON:  None Available. FINDINGS: Marked osteopenia. No obvious displaced fracture or dislocation of the pelvis or proximal right femur. Status post left hip total arthroplasty. No obvious perihardware fracture. Nonobstructive pattern of overlying bowel gas. Vascular calcinosis. IMPRESSION: 1. Marked osteopenia. No obvious displaced fracture or dislocation of the pelvis or proximal right femur. 2. Status post left hip total arthroplasty. No obvious perihardware fracture. 3. Plain radiographs are significantly limited in sensitivity for hip and pelvic fracture. Recommend CT if there is clinical suspicion for fracture. Electronically Signed   By: Jearld Lesch M.D.   On: 03/13/2023 13:17   DG Chest Portable 1 View  Result Date: 03/13/2023 CLINICAL DATA:  fall  EXAM: PORTABLE CHEST 1 VIEW COMPARISON:  Chest radiograph 12/11/2022 FINDINGS: Asymmetric elevation of the right hemidiaphragm, unchanged from prior exam. Pleural effusion. Pneumothorax. Redemonstrated are multiple subacute to chronic bilateral rib fractures. No definite evidence of an acute displaced rib fracture. Cardiomegaly. Hazy bibasilar airspace opacities could represent atelectasis or infection. Visualized upper abdomen is unremarkable IMPRESSION: 1. Hazy bibasilar airspace opacities could represent atelectasis or infection. 2. Redemonstrated are multiple subacute to chronic bilateral rib fractures. No definite evidence of an acute displaced rib fracture. Electronically Signed   By: Lorenza Cambridge M.D.   On: 03/13/2023 13:11    Pending Labs Unresulted Labs (From admission, onward)     Start     Ordered   03/13/23 2047  Comprehensive metabolic panel  Once,   R        03/13/23 2047   03/13/23 2047  Magnesium  Once,   R        03/13/23 2047            Vitals/Pain Today's Vitals   03/13/23 1839 03/13/23 1915 03/13/23 2100 03/13/23 2130  BP: 94/65 102/78 103/76 112/78  Pulse: (!) 112 (!) 108 (!) 105 100  Resp: 18   18  Temp: 99 F (37.2 C)   98.8 F (37.1 C)  TempSrc: Oral   Oral  SpO2: 96% 93% 94% 94%  PainSc:        Isolation Precautions No active isolations  Medications Medications  predniSONE (DELTASONE) tablet 5 mg (has no administration in time range)  gabapentin (NEURONTIN) capsule 400 mg (has no administration in time range)  metoprolol tartrate (LOPRESSOR) tablet 50 mg (50 mg Oral Patient Refused/Not Given 03/13/23 2101)  loratadine (CLARITIN) tablet 10 mg (has no administration in time range)  atorvastatin (LIPITOR) tablet 10 mg (has no administration in time range)  sertraline (ZOLOFT) tablet 100 mg (100 mg Oral Patient Refused/Not Given 03/13/23 2101)  cholecalciferol (VITAMIN D3) 25 MCG (1000 UNIT) tablet 1,000 Units (has no administration in time range)   pantoprazole (PROTONIX) EC tablet 40 mg (40 mg Oral Patient Refused/Not Given 03/13/23 2101)  multivitamin with minerals tablet 1 tablet (has no administration in time range)  acetaminophen (TYLENOL) tablet 650 mg (has no administration in time range)    Or  acetaminophen (TYLENOL) suppository 650 mg (has no administration in time range)  ondansetron (ZOFRAN) tablet 4 mg (has no administration in time range)    Or  ondansetron (ZOFRAN) injection 4 mg (has no administration in time range)  traMADol (ULTRAM) tablet 50 mg (has no administration in time range)    Mobility non-ambulatory     Focused Assessments R Recommendations: See Admitting Provider Note  Report given to:   Additional Notes: Pt ambulatory normally.  Pt unable to bear weight d/t fx.  Purewick placed.  Pt brief from home placed as well.  Pt forgot dentures at home and requesting soft foods such as pudding, ice cream, and apple sauce.  Pleasant, alert, and oriented.  Super sweet. Family home for the night.

## 2023-03-13 NOTE — Assessment & Plan Note (Signed)
Non-op and WBAT per EDPs reported discussion with Dr. Linna Caprice PT/OT SW consult for possible placement Cont home ultram for pain for the moment, may need to escalate when up and moving.

## 2023-03-13 NOTE — Assessment & Plan Note (Signed)
Cont 5mg  daily prednisone. Most certianly contributing to her severe osteoporosis.

## 2023-03-13 NOTE — Patient Instructions (Addendum)
Pre visit review using our clinic review tool, if applicable. No additional management support is needed unless otherwise documented below in the visit note.  Continue 1/2 tablet daily except take 1 tablet on Thursdays. Recheck in 2 weeks, on 03/26/23.

## 2023-03-13 NOTE — ED Triage Notes (Addendum)
TRIAGE NOTE:   Patient arrives by EMS from [place] with c/o fall.   Patient fell around at around 0930, patient is on coumadin.   Patient reports dizziness before fall, does have history of vertigo.  No LOC patient reports pain to left elbow, hip and knee.   Small hematoma noted to top of head, that pt reports hit the dresser.      EMS VITALS CBG 135 HR 110's Afib (history of Afib) 110/74

## 2023-03-13 NOTE — Progress Notes (Signed)
   03/13/23 1200  Spiritual Encounters  Type of Visit Initial  Care provided to: Patient  Conversation partners present during encounter Nurse  Referral source Trauma page  Reason for visit Trauma  OnCall Visit No   Ch responded to trauma level II. Pt's said that her daughter was en route to [facility]. Pt said she is feeling better now since she is not moving. Ch provided compassionate presence and support to the care team. No follow-up needed at this time.

## 2023-03-13 NOTE — ED Notes (Signed)
Pt had night time meds from home in a pill compartment and elected to take those rather than from our pharmacy

## 2023-03-13 NOTE — H&P (Signed)
History and Physical    Patient: Stacey [name] YQM:[id number] DOB: 1933/07/09 DOA: 03/13/2023 DOS: the patient was seen and examined on 03/13/2023 PCP: [name], MD  Patient coming from: Home  Chief Complaint:  Chief Complaint  Patient presents with   Fall   HPI: Stacey [name] is a 89 y.o. female with medical history significant of A.Fib on coumadin, RA, chronic use of prednisone.  Severe osteoporosis with multiple prior fractures it looks like.  Pt with mechanical fall at home: Patient states today she felt like she was going to have an urgent bowel movement/diarrhea so she tried to turn quickly to get to the bathroom and this is what resulted in her fall.  She fell down onto her left side.  She did hit her head.  There was no loss of consciousness tonight.  No preceding chest pain, palpitations or shortness of breath.  Pt unable to ambulate in ED due to L hip pain.  Work up in ED ultimately revealed non-displaced acute fx of L superior pubic ramus.  Other fractures on imaging studies (ie ribs, compression fractures of lumbar vertebrae) felt to be subacute to chronic by radiologist.   Review of Systems: As mentioned in the history of present illness. All other systems reviewed and are negative. Past Medical History:  Diagnosis Date   Atrial fibrillation (HCC)    Hernia of abdominal cavity    Kyphosis    Past Surgical History:  Procedure Laterality Date   ABDOMINAL HYSTERECTOMY     CATARACT EXTRACTION, BILATERAL     TONSILLECTOMY     Social History:  reports that she has quit smoking. She has never used smokeless tobacco. She reports that she does not drink alcohol and does not use drugs.  Allergies  Allergen Reactions   Sulfa Antibiotics Anaphylaxis   Tape Other (See Comments)    BRUISES VERY EASILY!!!!   Codeine Nausea And Vomiting   Hydroxychloroquine     Other reaction(s): GI upset   Shellfish-Derived Products Other (See Comments)    Daughter was  unsure of the reaction- "happened years ago" and patient avoids foods that contain shellfish   Latex Rash   Neosporin Original [Bacitracin-Neomycin-Polymyxin] Rash   Polysporin [Bacitracin-Polymyxin B] Rash    Family History  Problem Relation Age of Onset   Healthy Mother    Heart failure Father     Prior to Admission medications   Medication Sig Start Date End Date Taking? Authorizing Provider  acetaminophen (TYLENOL) 500 MG tablet Take 1,500 mg by mouth daily. WITH TRAMADOL   Yes [provider]  atorvastatin (LIPITOR) 10 MG tablet TAKE 1 TABLET DAILY 03/05/23  Yes [name], MD  cyanocobalamin 1000 MCG tablet Take 1,000 mcg by mouth daily.   Yes [provider]  fexofenadine (ALLEGRA ALLERGY) 60 MG tablet Take 1 tablet (60 mg total) by mouth daily as needed for allergies or rhinitis. Patient taking differently: Take 60 mg by mouth daily. 04/25/22  Yes [name], MD  gabapentin (NEURONTIN) 400 MG capsule TAKE 1 CAPSULE DAILY 08/22/22  Yes [name], MD  hydrocortisone 1 % ointment Apply 1 Application topically 2 (two) times daily. Patient taking differently: Apply 1 Application topically 2 (two) times daily as needed for itching. 04/25/22  Yes [name], MD  meclizine (ANTIVERT) 12.5 MG tablet Take 1 tablet (12.5 mg total) by mouth 3 (three) times daily as needed for dizziness. 06/08/21  Yes [name], MD  metoprolol tartrate (  LOPRESSOR) 50 MG tablet TAKE 1 TABLET TWICE A DAY 05/06/22  Yes [name], MD  Multiple Vitamin (MULTIVITAMIN) capsule Take 1 capsule by mouth daily.   Yes [provider]  omeprazole (PRILOSEC) 20 MG capsule Take 20 mg by mouth daily.   Yes [provider]  potassium chloride SA (KLOR-CON M) 20 MEQ tablet TAKE 1 TABLET BY MOUTH DAILY Patient taking differently: Take 20 mEq by mouth daily. 06/03/22  Yes Conte, Tessa N, PA-C  predniSONE (DELTASONE) 5 MG tablet Take 5 mg by mouth daily.   Yes  [provider]  sertraline (ZOLOFT) 100 MG tablet TAKE 1 TABLET DAILY 04/08/22  Yes [name], MD  spironolactone (ALDACTONE) 25 MG tablet Take 0.5 tablets (12.5 mg total) by mouth daily. 04/26/22  Yes [name], MD  torsemide (DEMADEX) 20 MG tablet TAKE 2 TABLETS DAILY Patient taking differently: Take 20 mg by mouth 2 (two) times daily. 12/19/22  Yes [name], MD  traMADol (ULTRAM) 50 MG tablet TAKE 2 TABLETS TWICE A DAY AS NEEDED Patient taking differently: Take 50-100 mg by mouth See admin instructions. Take 2 tablets in the morning 1 tablet in the afternoon and 1 tablet at night 09/11/22  Yes [name], MD  Vitamin D, Cholecalciferol, 25 MCG (1000 UT) TABS Take 1,000 Units by mouth daily.   Yes [provider]  warfarin (COUMADIN) 5 MG tablet TAKE 1/2 TABLET BY MOUTH DAILY EXCEPT TAKE 1 TABLET ON TUESDAYS, THURSDAYS AND SATURDAYS OR AS DIRECTED BY ANTICOAGULATION CLINIC Patient taking differently: Take 2.5-5 mg by mouth See admin instructions. Take 5 mg on Thursdays and 2.5 mg on all other days. 12/06/22  Yes [name], MD    Physical Exam: Vitals:   03/13/23 1220 03/13/23 1839 03/13/23 1915  BP: 112/80 94/65 102/78  Pulse: (!) 101 (!) 112 (!) 108  Resp: 17 18   Temp: 97.7 F (36.5 C) 99 F (37.2 C)   TempSrc: Oral Oral   SpO2: 96% 96% 93%   Constitutional: NAD, calm, comfortable Respiratory: clear to auscultation bilaterally, no wheezing, no crackles. Normal respiratory effort. No accessory muscle use.  Cardiovascular: Regular rate and rhythm, no murmurs / rubs / gallops. No extremity edema. 2+ pedal pulses. No carotid bruits.  Abdomen: no tenderness, no masses palpated. No hepatosplenomegaly. Bowel sounds positive.  Musculoskeletal: Unable to ambulate due to L hip pain Skin: Ecchymosis to L elbow and L knee, small hematoma to L parietal area of head. Neurologic: CN 2-12 grossly intact. Sensation intact, DTR normal. Strength 5/5 in  all 4.  Psychiatric: Normal judgment and insight. Alert and oriented x 3. Normal mood.   Data Reviewed:    Labs on Admission: I have personally reviewed following labs and imaging studies  CBC: No results for input(s): "WBC", "NEUTROABS", "HGB", "HCT", "MCV", "PLT" in the last 168 hours. Basic Metabolic Panel: No results for input(s): "NA", "K", "CL", "CO2", "GLUCOSE", "BUN", "CREATININE", "CALCIUM", "MG", "PHOS" in the last 168 hours. GFR: CrCl cannot be calculated (Patient's most recent lab result is older than the maximum 21 days allowed.). Liver Function Tests: No results for input(s): "AST", "ALT", "ALKPHOS", "BILITOT", "PROT", "ALBUMIN" in the last 168 hours. No results for input(s): "LIPASE", "AMYLASE" in the last 168 hours. No results for input(s): "AMMONIA" in the last 168 hours. Coagulation Profile: Recent Labs  Lab 03/12/23 0000  INR 2.0   Cardiac Enzymes: No results for input(s): "CKTOTAL", "CKMB", "CKMBINDEX", "TROPONINI" in the last 168  hours. BNP (last 3 results) No results for input(s): "PROBNP" in the last 8760 hours. HbA1C: No results for input(s): "HGBA1C" in the last 72 hours. CBG: No results for input(s): "GLUCAP" in the last 168 hours. Lipid Profile: No results for input(s): "CHOL", "HDL", "LDLCALC", "TRIG", "CHOLHDL", "LDLDIRECT" in the last 72 hours. Thyroid Function Tests: No results for input(s): "TSH", "T4TOTAL", "FREET4", "T3FREE", "THYROIDAB" in the last 72 hours. Anemia Panel: No results for input(s): "VITAMINB12", "FOLATE", "FERRITIN", "TIBC", "IRON", "RETICCTPCT" in the last 72 hours. Urine analysis:    Component Value Date/Time   COLORURINE YELLOW 05/22/2019 0430   APPEARANCEUR CLEAR 05/22/2019 0430   LABSPEC 1.006 05/22/2019 0430   PHURINE 7.0 05/22/2019 0430   GLUCOSEU NEGATIVE 05/22/2019 0430   HGBUR NEGATIVE 05/22/2019 0430   BILIRUBINUR negative 07/11/2021 1239   KETONESUR NEGATIVE 05/22/2019 0430   PROTEINUR Positive (A)  07/11/2021 1239   PROTEINUR NEGATIVE 05/22/2019 0430   UROBILINOGEN 0.2 07/11/2021 1239   NITRITE negative 07/11/2021 1239   NITRITE NEGATIVE 05/22/2019 0430   LEUKOCYTESUR Large (3+) (A) 07/11/2021 1239   LEUKOCYTESUR NEGATIVE 05/22/2019 0430    Radiological Exams on Admission: CT NO CHARGE  Result Date: 03/13/2023 CLINICAL DATA:  Hip pain, stress fracture suspected. EXAM: CT ADDITIONAL VIEWS AT NO CHARGE TECHNIQUE: Axial, coronal and sagittal osseous reformats of the pelvis were submitted from CT abdomen and pelvis. CONTRAST:  No additional COMPARISON:  CT abdomen and pelvis 03/13/2023 FINDINGS: Osseous: The bones are diffusely osteopenic. There is an acute nondisplaced fracture of the left superior pubic ramus. There is no evidence for dislocation. There also findings suspicious for nondisplaced fracture of the anterior left sacral ala. There is mild asymmetric widening of the left sacroiliac joint anteriorly measuring up to 5 mm (right-sided measures 2 mm). There is a left hip total arthroplasty in anatomic alignment. No evidence for pubic diastasis. There are moderate degenerative changes of the right hip with joint space narrowing and osteophyte formation. There is mild compression deformity of the superior endplate of L5 which is favored as chronic. Soft tissues: No focal soft tissue hematoma identified. Peripheral vascular calcifications are present. IMPRESSION: 1. Acute nondisplaced fracture of the left superior pubic ramus. 2. Findings suspicious for acute nondisplaced fracture of the anterior left sacral ala. 3. Mild asymmetric widening of the left sacroiliac joint anteriorly. 4. Left hip total arthroplasty in anatomic alignment. 5. Moderate degenerative changes of the right hip. 6. Mild compression deformity of the superior endplate of L5 is favored as chronic. Electronically Signed   By: Darliss Cheney M.D.   On: 03/13/2023 19:03   CT ABDOMEN PELVIS WO CONTRAST  Result Date:  03/13/2023 CLINICAL DATA:  Diarrhea EXAM: CT ABDOMEN AND PELVIS WITHOUT CONTRAST TECHNIQUE: Multidetector CT imaging of the abdomen and pelvis was performed following the standard protocol without IV contrast. RADIATION DOSE REDUCTION: This exam was performed according to the departmental dose-optimization program which includes automated exposure control, adjustment of the mA and/or kV according to patient size and/or use of iterative reconstruction technique. COMPARISON:  MRI lumbar spine 02/20/2021. Lumbar spine x-ray 05/11/2021. FINDINGS: Lower chest: There is atelectasis or scarring in the lung bases. The heart is enlarged. Hepatobiliary: No focal liver abnormality is seen. No gallstones, gallbladder wall thickening, or biliary dilatation. Pancreas: Unremarkable. No pancreatic ductal dilatation or surrounding inflammatory changes. Spleen: Normal in size without focal abnormality. Adrenals/Urinary Tract: Adrenal glands are unremarkable. Kidneys are normal, without renal calculi, focal lesion, or hydronephrosis. Bladder is unremarkable. Stomach/Bowel: There is a large  hiatal hernia containing the proximal stomach. The stomach is nondilated. No evidence of bowel wall thickening, distention, or inflammatory changes. The appendix is not seen. There is sigmoid colon diverticulosis. Vascular/Lymphatic: There severe atherosclerotic calcifications of the aorta and branch vessels. Peripheral vascular calcifications are present. No evidence for abdominal aortic aneurysm. No enlarged lymph nodes are seen. Reproductive: Status post hysterectomy. No adnexal masses. Other: The bones are diffusely osteopenic. Musculoskeletal: The bones are markedly osteopenic. There is levoconvex scoliosis of the lumbar spine with multilevel degenerative change. There are compression deformities of T10 and T11 which are incompletely imaged favored as chronic. There is also mild compression deformity of the superior endplate of L5 which is new  from 2022, but favored as chronic. Recommend correlation with point tenderness. Left hip arthroplasty is present. There is an acute nondisplaced fracture of the left superior pubic ramus. IMPRESSION: 1. Acute nondisplaced fracture of the left superior pubic ramus. 2. Compression deformities of T10, T11, and L5 are favored as chronic. Recommend correlation with point tenderness. 3. Large hiatal hernia. 4. Cardiomegaly. 5. Sigmoid colon diverticulosis. Electronically Signed   By: Darliss Cheney M.D.   On: 03/13/2023 18:10   DG Knee Complete 4 Views Left  Result Date: 03/13/2023 CLINICAL DATA:  Pain after fall EXAM: LEFT KNEE - COMPLETE 4 VIEW COMPARISON:  None Available. FINDINGS: Osteopenia. No fracture or dislocation. There are some joint space loss of the lateral compartment. Small osteophytes. Small osteophytes along the patellofemoral joint. Chondrocalcinosis. Extensive vascular calcifications are seen. No joint effusion on lateral view. IMPRESSION: Severe osteopenia.  Degenerative changes.  Chondrocalcinosis. Electronically Signed   By: Karen Kays M.D.   On: 03/13/2023 14:01   DG Hip Unilat W or Wo Pelvis 2-3 Views Left  Result Date: 03/13/2023 CLINICAL DATA:  Left hip pain after fall. EXAM: DG HIP (WITH OR WITHOUT PELVIS) 2-3V LEFT COMPARISON:  None Available. FINDINGS: Status post left total hip arthroplasty. Diffuse osteopenia is noted. No definite fracture or dislocation is noted. Vascular calcifications are noted. IMPRESSION: No definite acute abnormality seen. Electronically Signed   By: Lupita Raider M.D.   On: 03/13/2023 13:59   DG Elbow Complete Left  Result Date: 03/13/2023 CLINICAL DATA:  Pain after fall EXAM: LEFT ELBOW - COMPLETE 4 VIEW COMPARISON:  None Available. FINDINGS: Osteopenia. No fracture or dislocation. Preserved joint spaces. No joint effusion seen on lateral view. IV site along the antecubital region. IMPRESSION: Osteopenia.  No acute osseous abnormality. Electronically  Signed   By: Karen Kays M.D.   On: 03/13/2023 13:56   CT Head Wo Contrast  Result Date: 03/13/2023 CLINICAL DATA:  Fall, Coumadin EXAM: CT HEAD WITHOUT CONTRAST CT CERVICAL SPINE WITHOUT CONTRAST TECHNIQUE: Multidetector CT imaging of the head and cervical spine was performed following the standard protocol without intravenous contrast. Multiplanar CT image reconstructions of the cervical spine were also generated. RADIATION DOSE REDUCTION: This exam was performed according to the departmental dose-optimization program which includes automated exposure control, adjustment of the mA and/or kV according to patient size and/or use of iterative reconstruction technique. COMPARISON:  None Available. FINDINGS: CT HEAD FINDINGS Brain: No evidence of acute infarction, hemorrhage, hydrocephalus, extra-axial collection or mass lesion/mass effect. Periventricular white matter hypodensity. Vascular: No hyperdense vessel or unexpected calcification. Skull: Normal. Negative for fracture or focal lesion. Sinuses/Orbits: No acute finding. Other: None. CT CERVICAL SPINE FINDINGS Alignment: Normal. Skull base and vertebrae: Osteopenia. No acute fracture. No primary bone lesion or focal pathologic process. Soft tissues and spinal  canal: No prevertebral fluid or swelling. No visible canal hematoma. Disc levels: Generally mild multilevel cervical disc space height loss and osteophytosis. Upper chest: Negative. Other: None. IMPRESSION: 1. No acute intracranial pathology. Small-vessel white matter disease. 2. Osteopenia. No fracture or static subluxation of the cervical spine. 3. Generally mild multilevel cervical disc degenerative disease. Electronically Signed   By: Jearld Lesch M.D.   On: 03/13/2023 13:23   CT Cervical Spine Wo Contrast  Result Date: 03/13/2023 CLINICAL DATA:  Fall, Coumadin EXAM: CT HEAD WITHOUT CONTRAST CT CERVICAL SPINE WITHOUT CONTRAST TECHNIQUE: Multidetector CT imaging of the head and cervical spine was  performed following the standard protocol without intravenous contrast. Multiplanar CT image reconstructions of the cervical spine were also generated. RADIATION DOSE REDUCTION: This exam was performed according to the departmental dose-optimization program which includes automated exposure control, adjustment of the mA and/or kV according to patient size and/or use of iterative reconstruction technique. COMPARISON:  None Available. FINDINGS: CT HEAD FINDINGS Brain: No evidence of acute infarction, hemorrhage, hydrocephalus, extra-axial collection or mass lesion/mass effect. Periventricular white matter hypodensity. Vascular: No hyperdense vessel or unexpected calcification. Skull: Normal. Negative for fracture or focal lesion. Sinuses/Orbits: No acute finding. Other: None. CT CERVICAL SPINE FINDINGS Alignment: Normal. Skull base and vertebrae: Osteopenia. No acute fracture. No primary bone lesion or focal pathologic process. Soft tissues and spinal canal: No prevertebral fluid or swelling. No visible canal hematoma. Disc levels: Generally mild multilevel cervical disc space height loss and osteophytosis. Upper chest: Negative. Other: None. IMPRESSION: 1. No acute intracranial pathology. Small-vessel white matter disease. 2. Osteopenia. No fracture or static subluxation of the cervical spine. 3. Generally mild multilevel cervical disc degenerative disease. Electronically Signed   By: Jearld Lesch M.D.   On: 03/13/2023 13:23   DG Pelvis Portable  Result Date: 03/13/2023 CLINICAL DATA:  Fall EXAM: PORTABLE PELVIS 1-2 VIEWS COMPARISON:  None Available. FINDINGS: Marked osteopenia. No obvious displaced fracture or dislocation of the pelvis or proximal right femur. Status post left hip total arthroplasty. No obvious perihardware fracture. Nonobstructive pattern of overlying bowel gas. Vascular calcinosis. IMPRESSION: 1. Marked osteopenia. No obvious displaced fracture or dislocation of the pelvis or proximal right  femur. 2. Status post left hip total arthroplasty. No obvious perihardware fracture. 3. Plain radiographs are significantly limited in sensitivity for hip and pelvic fracture. Recommend CT if there is clinical suspicion for fracture. Electronically Signed   By: Jearld Lesch M.D.   On: 03/13/2023 13:17   DG Chest Portable 1 View  Result Date: 03/13/2023 CLINICAL DATA:  fall EXAM: PORTABLE CHEST 1 VIEW COMPARISON:  Chest radiograph 12/11/2022 FINDINGS: Asymmetric elevation of the right hemidiaphragm, unchanged from prior exam. Pleural effusion. Pneumothorax. Redemonstrated are multiple subacute to chronic bilateral rib fractures. No definite evidence of an acute displaced rib fracture. Cardiomegaly. Hazy bibasilar airspace opacities could represent atelectasis or infection. Visualized upper abdomen is unremarkable IMPRESSION: 1. Hazy bibasilar airspace opacities could represent atelectasis or infection. 2. Redemonstrated are multiple subacute to chronic bilateral rib fractures. No definite evidence of an acute displaced rib fracture. Electronically Signed   By: Lorenza Cambridge M.D.   On: 03/13/2023 13:11    EKG: Independently reviewed.   Assessment and Plan: * Closed fracture of left superior pubic ramus (HCC) Non-op and WBAT per EDPs reported discussion with Dr. Linna Caprice PT/OT SW consult for possible placement Cont home ultram for pain for the moment, may need to escalate when up and moving.   Chronic atrial fibrillation (HCC) Cont  coumadin per pharm consult Cont BID metoprolol  Long term systemic steroid user Cont 5mg  daily prednisone. Most certianly contributing to her severe osteoporosis.  Chronic heart failure with preserved ejection fraction (HFpEF) (HCC) BP a bit on soft side this evening, holding aldactone and torsemide for the moment.      Advance Care Planning:   Code Status: Full Code Default  Consults: Dr. Linna Caprice called by EDP  Family Communication: No family in  room  Severity of Illness: The appropriate patient status for this patient is OBSERVATION. Observation status is judged to be reasonable and necessary in order to provide the required intensity of service to ensure the patient's safety. The patient's presenting symptoms, physical exam findings, and initial radiographic and laboratory data in the context of their medical condition is felt to place them at decreased risk for further clinical deterioration. Furthermore, it is anticipated that the patient will be medically stable for discharge from the hospital within 2 midnights of admission.   Author: Hillary Bow., DO 03/13/2023 8:55 PM  For on call review [email or web address].

## 2023-03-14 DIAGNOSIS — D649 Anemia, unspecified: Secondary | ICD-10-CM

## 2023-03-14 DIAGNOSIS — S32512A Fracture of superior rim of left pubis, initial encounter for closed fracture: Secondary | ICD-10-CM | POA: Diagnosis not present

## 2023-03-14 LAB — MRSA NEXT GEN BY PCR, NASAL: MRSA by PCR Next Gen: NOT DETECTED

## 2023-03-14 LAB — GLUCOSE, CAPILLARY: Glucose-Capillary: 125 mg/dL — ABNORMAL HIGH (ref 70–99)

## 2023-03-14 MED ORDER — WARFARIN SODIUM 5 MG PO TABS
5.0000 mg | ORAL_TABLET | Freq: Once | ORAL | Status: AC
  Administered 2023-03-14: 5 mg via ORAL
  Filled 2023-03-14: qty 1

## 2023-03-14 NOTE — Progress Notes (Addendum)
ANTICOAGULATION CONSULT NOTE  Pharmacy Consult for Warfarin Indication: atrial fibrillation  Allergies  Allergen Reactions   Sulfa Antibiotics Anaphylaxis   Tape Other (See Comments)    BRUISES VERY EASILY!!!!   Codeine Nausea And Vomiting   Hydroxychloroquine     Other reaction(s): GI upset   Shellfish-Derived Products Other (See Comments)    Daughter was unsure of the reaction- "happened years ago" and patient avoids foods that contain shellfish   Latex Rash   Neosporin Original [Bacitracin-Neomycin-Polymyxin] Rash   Polysporin [Bacitracin-Polymyxin B] Rash    Patient Measurements:    Vital Signs: Temp: 98.3 F (36.8 C) (08/02 0451) Temp Source: Oral (08/02 0451) BP: 101/70 (08/02 0451) Pulse Rate: 102 (08/02 0451)  Labs: Recent Labs    03/12/23 0000 03/13/23 2101 03/13/23 2351  HGB  --  10.4*  --   HCT  --  31.9*  --   PLT  --  173  --   LABPROT  --  22.3* 20.8*  INR 2.0 1.9* 1.8*  CREATININE  --  0.79  --     CrCl cannot be calculated (Unknown ideal weight.).   Medical History: Past Medical History:  Diagnosis Date   Atrial fibrillation (HCC)    Hernia of abdominal cavity    Kyphosis     Assessment: 5 YOF admitted with hip fracture planning non-operative management. On warfarin PTA for atrial fibrillation. Pharmacy consulted to dose while inpatient.   PTA regimen 5mg  Thurs and 2.5mg  all other days  8/2: Spoke with patient and daughter and confirmed that patient took home dose of 5 mg on 8/1, which was not documented by RN. INR remains subtherapeutic at 1.9 last night. CBC stable and no signs of bleeding noted.  Goal of Therapy:  INR 2-3 Monitor platelets by anticoagulation protocol: Yes   Plan:  Given recent low INR (highest 2.0), will plan to administer warfarin 5 mg dose again today and then resume normal home regimen.  Daily INR Monitor CBC and s/sx of bleeding  Enos Fling, PharmD PGY-1 Acute Care Pharmacy Resident 03/14/2023 9:18  AM

## 2023-03-14 NOTE — Care Management Obs Status (Signed)
MEDICARE OBSERVATION STATUS NOTIFICATION   Patient Details  Name: Stacey [name] MRN: [id number] Date of Birth: [birth year]   Medicare Observation Status Notification Given:  Yes    Kingsley Plan, RN 03/14/2023, 2:47 PM

## 2023-03-14 NOTE — Telephone Encounter (Signed)
Ok  Will follow

## 2023-03-14 NOTE — Evaluation (Signed)
Occupational Therapy Evaluation Patient Details Name: Stacey [name] MRN: [id number] DOB: 1932/10/12 Today's Date: 03/14/2023   History of Present Illness The pt is an 89 yo female presenting 8/1 from [place] after a fall in which she hit her head on a dresser. Work up revealed L superior pubic rami fx. PMH includes: vertigo, afib on anticoagulants,   Clinical Impression   Pt admitted for above, PTA she reports living at ALF and using SPC to ambulate, ind with bADLs. Pt currently presenting with impaired balance, has a posterior lean sitting EOB and is limited by pain in LLE. Pt needing Max A for bed and OOB mobility, Max A for LB ADLs. she reports 5 falls in the last year. Pt would benefit from continued acute skilled OT services to address deficits and help transition to next level of care. Based on current findings, Patient would benefit from post acute skilled rehab facility with <3 hours of therapy and 24/7 support, Pt reports she may be able to live with daughter and have help at home if able to DC home, will need to clarify.     Recommendations for follow up therapy are one component of a multi-disciplinary discharge planning process, led by the attending physician.  Recommendations may be updated based on patient status, additional functional criteria and insurance authorization.   Assistance Recommended at Discharge Frequent or constant Supervision/Assistance  Patient can return home with the following A lot of help with walking and/or transfers;A lot of help with bathing/dressing/bathroom;Assistance with cooking/housework;Assist for transportation;Help with stairs or ramp for entrance    Functional Status Assessment  Patient has had a recent decline in their functional status and demonstrates the ability to make significant improvements in function in a reasonable and predictable amount of time.  Equipment Recommendations  None recommended by OT (defer to next level of care)     Recommendations for Other Services       Precautions / Restrictions Precautions Precautions: Fall Restrictions Weight Bearing Restrictions: Yes LLE Weight Bearing: Weight bearing as tolerated      Mobility Bed Mobility Overal bed mobility: Needs Assistance Bed Mobility: Supine to Sit     Supine to sit: Max assist     General bed mobility comments: Pt left sitting in recliner, needs assist with LLE management during bed mobility. Mod A for anterior scooting to EOB, cues for hand placement    Transfers Overall transfer level: Needs assistance Equipment used: None Transfers: Sit to/from Stand, Bed to chair/wheelchair/BSC Sit to Stand: Max assist Stand pivot transfers: Max assist         General transfer comment: Pain limited, puts in little effort due to pain wiht mobility      Balance Overall balance assessment: Needs assistance Sitting-balance support: Bilateral upper extremity supported, Single extremity supported Sitting balance-Leahy Scale: Poor Sitting balance - Comments: needs assist for forward leaning Postural control: Posterior lean   Standing balance-Leahy Scale: Zero                             ADL either performed or assessed with clinical judgement   ADL Overall ADL's : Needs assistance/impaired Eating/Feeding: Independent;Sitting   Grooming: Sitting;Min guard   Upper Body Bathing: Modified independent;Sitting   Lower Body Bathing: Maximal assistance;Sitting/lateral leans   Upper Body Dressing : Modified independent;Sitting   Lower Body Dressing: Maximal assistance;Sitting/lateral leans   Toilet Transfer: Stand-pivot;Maximal assistance;BSC/3in1   Toileting- Clothing Manipulation and Hygiene: Sit to/from stand;Maximal  assistance               Vision         Perception     Praxis      Pertinent Vitals/Pain Pain Assessment Pain Assessment: Faces Faces Pain Scale: Hurts whole lot Pain Location: LLE with  movement Pain Descriptors / Indicators: Aching, Discomfort, Moaning, Grimacing, Guarding Pain Intervention(s): Limited activity within patient's tolerance, Monitored during session, Repositioned, RN gave pain meds during session     Hand Dominance     Extremity/Trunk Assessment Upper Extremity Assessment Upper Extremity Assessment: Generalized weakness   Lower Extremity Assessment Lower Extremity Assessment: Defer to PT evaluation   Cervical / Trunk Assessment Cervical / Trunk Assessment: Kyphotic   Communication Communication Communication: No difficulties   Cognition Arousal/Alertness: Awake/alert Behavior During Therapy: WFL for tasks assessed/performed Overall Cognitive Status: Within Functional Limits for tasks assessed                                       General Comments  HR in constant afib, up to 123 bpm with mobility due to pain onset. HR rests between 98-111 bpm    Exercises     Shoulder Instructions      Home Living Family/patient expects to be discharged to:: Assisted living                             Home Equipment: Rolling Walker (2 wheels);Cane - single point   Additional Comments: Pt reports 5 falls in the last year      Prior Functioning/Environment Prior Level of Function : History of Falls (last six months);Independent/Modified Independent             Mobility Comments: ambulated with SPC ADLs Comments: Ind        OT Problem List: Decreased strength;Impaired balance (sitting and/or standing);Pain      OT Treatment/Interventions: Self-care/ADL training;Balance training;Therapeutic exercise;DME and/or AE instruction;Therapeutic activities;Patient/family education    OT Goals(Current goals can be found in the care plan section) Acute Rehab OT Goals Patient Stated Goal: To get stronger OT Goal Formulation: With patient Time For Goal Achievement: 03/28/23 Potential to Achieve Goals: Good ADL Goals Pt Will  Perform Grooming: sitting;with set-up;with supervision Pt Will Perform Lower Body Bathing: sitting/lateral leans;with min guard assist Pt Will Perform Lower Body Dressing: sitting/lateral leans;with min guard assist;with adaptive equipment Pt Will Transfer to Toilet: stand pivot transfer;with min assist  OT Frequency: Min 1X/week    Co-evaluation              AM-PAC OT "6 Clicks" Daily Activity     Outcome Measure Help from another person eating meals?: None Help from another person taking care of personal grooming?: A Little Help from another person toileting, which includes using toliet, bedpan, or urinal?: A Lot Help from another person bathing (including washing, rinsing, drying)?: A Lot Help from another person to put on and taking off regular upper body clothing?: None Help from another person to put on and taking off regular lower body clothing?: A Lot 6 Click Score: 17   End of Session Equipment Utilized During Treatment: Gait belt Nurse Communication: Mobility status (max A pivots)  Activity Tolerance: Patient tolerated treatment well Patient left: in chair;with call bell/phone within reach;with chair alarm set  OT Visit Diagnosis: Unsteadiness on feet (R26.81);Other abnormalities of gait and mobility (  R26.89);Pain;Muscle weakness (generalized) (M62.81) Pain - Right/Left: Left Pain - part of body: Leg                Time: 1125-1200 OT Time Calculation (min): 35 min Charges:  OT General Charges $OT Visit: 1 Visit OT Evaluation $OT Eval Moderate Complexity: 1 Mod OT Treatments $Therapeutic Activity: 8-22 mins  03/14/2023  AB, OTR/L  Acute Rehabilitation Services  Office: [phone]   Tristan Schroeder 03/14/2023, 12:10 PM

## 2023-03-14 NOTE — Progress Notes (Signed)
[facility] PROGRESS NOTE   Stacey [name] NGE:[id number] DOB: [birth year] DOA: 03/13/2023  PCP: [name], MD  Brief History/Interval Summary: 89 y.o. female with medical history significant of A.Fib on coumadin, RA, chronic use of prednisone etiology, history of severe osteoporosis.  Patient presented after mechanical fall at her assisted living facility.  Evaluated in the emergency department.  Found to have pubic ramus fracture.  Was hospitalized for further management due to pain issues and immobility.  Consultants: Phone discussion with orthopedics by EDP  Procedures: None    Subjective/Interval History: Patient mentions that pain 6 out of 10 in intensity in the left pelvic area.  Has difficulty moving her left leg as a result.  Denies any chest pain shortness of breath.  No nausea or vomiting.     Assessment/Plan:  Left pubic ramus fracture. Discussed with orthopedics.  To be managed conservatively.  No indication for surgical intervention. Continue with pain medications as needed.  Bowel regimen. Patient is on tramadol at home which is being continued. PT and OT elevation.  Chronic atrial fibrillation Continue Coumadin per pharmacy.  Also on metoprolol.  Long-term systemic steroid use Patient has been on prednisone for 25 years.  She does not know the reason for it though she does mention that she has a history of rheumatoid arthritis.  Chronic diastolic CHF Euvolemic.  Normocytic anemia No evidence of overt bleeding.  Recheck labs tomorrow.  Compression deformities of T10, T11 and L5 Thought to be chronic.  Incidentally noted on CT scan.  Large hiatal hernia Continue PPI  Bilateral rib fractures Chronic.  Likely due to falls and osteoporosis.  Asymptomatic.   DVT Prophylaxis: On Coumadin Code Status: Full code Family Communication: No family at bedside Disposition Plan: Await PT and OT evaluation.  May need higher level of care.   Currently in assisted living facility.  Status is: Observation The patient will require care spanning > 2 midnights and should be moved to inpatient because: Poorly controlled pain, immobility     Medications: Scheduled:  atorvastatin  10 mg Oral Daily   cholecalciferol  1,000 Units Oral Daily   gabapentin  400 mg Oral Daily   loratadine  10 mg Oral Daily   metoprolol tartrate  50 mg Oral BID   multivitamin with minerals  1 tablet Oral Daily   pantoprazole  40 mg Oral Daily   predniSONE  5 mg Oral Daily   sertraline  100 mg Oral Daily   traMADol  50 mg Oral TID   warfarin  5 mg Oral ONCE-1600   Warfarin - Pharmacist Dosing Inpatient   Does not apply q1600   Continuous: MWN:[id number] **OR** acetaminophen, ondansetron **OR** ondansetron (ZOFRAN) IV  Antibiotics: Anti-infectives (From admission, onward)    None       Objective:  Vital Signs  Vitals:   03/13/23 2130 03/13/23 2230 03/14/23 0451 03/14/23 0948  BP: 112/78 110/81 101/70 105/71  Pulse: 100 (!) 106 (!) 102   Resp: 18  18 18   Temp: 98.8 F (37.1 C)  98.3 F (36.8 C) 98.8 F (37.1 C)  TempSrc: Oral  Oral Oral  SpO2: 94% 97% 91% 94%   No intake or output data in the 24 hours ending 03/14/23 1022 There were no vitals filed for this visit.  General appearance: Awake alert.  In no distress Resp: Clear to auscultation bilaterally.  Normal effort Cardio: S1-S2 is normal regular.  No S3-S4.  No rubs murmurs or bruit GI: Abdomen is  soft.  Nontender nondistended.  Bowel sounds are present normal.  No masses organomegaly Extremities: No edema.  Limited range of motion of the left lower extremity due to pain Neurologic: Alert and oriented x3.  No focal neurological deficits.    Lab Results:  Data Reviewed: I have personally reviewed following labs and reports of the imaging studies  CBC: Recent Labs  Lab 03/13/23 2101  WBC 7.4  NEUTROABS 6.0  HGB 10.4*  HCT 31.9*  MCV 94.1  PLT 173    Basic  Metabolic Panel: Recent Labs  Lab 03/13/23 2101  NA 134*  K 3.8  CL 95*  CO2 29  GLUCOSE 103*  BUN 10  CREATININE 0.79  CALCIUM 8.9  MG 1.9    GFR: CrCl cannot be calculated (Unknown ideal weight.).  Liver Function Tests: Recent Labs  Lab 03/13/23 2101  AST 34  ALT 24  ALKPHOS 72  BILITOT 1.3*  PROT 6.0*  ALBUMIN 3.2*     Coagulation Profile: Recent Labs  Lab 03/12/23 0000 03/13/23 2101 03/13/23 2351  INR 2.0 1.9* 1.8*      Recent Results (from the past 240 hour(s))  MRSA Next Gen by PCR, Nasal     Status: None   Collection Time: 03/14/23  6:48 AM   Specimen: Nasal Mucosa; Nasal Swab  Result Value Ref Range Status   MRSA by PCR Next Gen NOT DETECTED NOT DETECTED Final    Comment: (NOTE) The GeneXpert MRSA Assay (FDA approved for NASAL specimens only), is one component of a comprehensive MRSA colonization surveillance program. It is not intended to diagnose MRSA infection nor to guide or monitor treatment for MRSA infections. Test performance is not FDA approved in patients less than 54 years old. Performed at [facility], [address] [place], [place] [address]       Radiology Studies: CT NO CHARGE  Result Date: 03/13/2023 CLINICAL DATA:  Hip pain, stress fracture suspected. EXAM: CT ADDITIONAL VIEWS AT NO CHARGE TECHNIQUE: Axial, coronal and sagittal osseous reformats of the pelvis were submitted from CT abdomen and pelvis. CONTRAST:  No additional COMPARISON:  CT abdomen and pelvis 03/13/2023 FINDINGS: Osseous: The bones are diffusely osteopenic. There is an acute nondisplaced fracture of the left superior pubic ramus. There is no evidence for dislocation. There also findings suspicious for nondisplaced fracture of the anterior left sacral ala. There is mild asymmetric widening of the left sacroiliac joint anteriorly measuring up to 5 mm (right-sided measures 2 mm). There is a left hip total arthroplasty in anatomic alignment. No evidence for  pubic diastasis. There are moderate degenerative changes of the right hip with joint space narrowing and osteophyte formation. There is mild compression deformity of the superior endplate of L5 which is favored as chronic. Soft tissues: No focal soft tissue hematoma identified. Peripheral vascular calcifications are present. IMPRESSION: 1. Acute nondisplaced fracture of the left superior pubic ramus. 2. Findings suspicious for acute nondisplaced fracture of the anterior left sacral ala. 3. Mild asymmetric widening of the left sacroiliac joint anteriorly. 4. Left hip total arthroplasty in anatomic alignment. 5. Moderate degenerative changes of the right hip. 6. Mild compression deformity of the superior endplate of L5 is favored as chronic. Electronically Signed   By: Darliss Cheney M.D.   On: 03/13/2023 19:03   CT ABDOMEN PELVIS WO CONTRAST  Result Date: 03/13/2023 CLINICAL DATA:  Diarrhea EXAM: CT ABDOMEN AND PELVIS WITHOUT CONTRAST TECHNIQUE: Multidetector CT imaging of the abdomen and pelvis was performed  following the standard protocol without IV contrast. RADIATION DOSE REDUCTION: This exam was performed according to the departmental dose-optimization program which includes automated exposure control, adjustment of the mA and/or kV according to patient size and/or use of iterative reconstruction technique. COMPARISON:  MRI lumbar spine 02/20/2021. Lumbar spine x-ray 05/11/2021. FINDINGS: Lower chest: There is atelectasis or scarring in the lung bases. The heart is enlarged. Hepatobiliary: No focal liver abnormality is seen. No gallstones, gallbladder wall thickening, or biliary dilatation. Pancreas: Unremarkable. No pancreatic ductal dilatation or surrounding inflammatory changes. Spleen: Normal in size without focal abnormality. Adrenals/Urinary Tract: Adrenal glands are unremarkable. Kidneys are normal, without renal calculi, focal lesion, or hydronephrosis. Bladder is unremarkable. Stomach/Bowel: There is  a large hiatal hernia containing the proximal stomach. The stomach is nondilated. No evidence of bowel wall thickening, distention, or inflammatory changes. The appendix is not seen. There is sigmoid colon diverticulosis. Vascular/Lymphatic: There severe atherosclerotic calcifications of the aorta and branch vessels. Peripheral vascular calcifications are present. No evidence for abdominal aortic aneurysm. No enlarged lymph nodes are seen. Reproductive: Status post hysterectomy. No adnexal masses. Other: The bones are diffusely osteopenic. Musculoskeletal: The bones are markedly osteopenic. There is levoconvex scoliosis of the lumbar spine with multilevel degenerative change. There are compression deformities of T10 and T11 which are incompletely imaged favored as chronic. There is also mild compression deformity of the superior endplate of L5 which is new from 2022, but favored as chronic. Recommend correlation with point tenderness. Left hip arthroplasty is present. There is an acute nondisplaced fracture of the left superior pubic ramus. IMPRESSION: 1. Acute nondisplaced fracture of the left superior pubic ramus. 2. Compression deformities of T10, T11, and L5 are favored as chronic. Recommend correlation with point tenderness. 3. Large hiatal hernia. 4. Cardiomegaly. 5. Sigmoid colon diverticulosis. Electronically Signed   By: Darliss Cheney M.D.   On: 03/13/2023 18:10   DG Knee Complete 4 Views Left  Result Date: 03/13/2023 CLINICAL DATA:  Pain after fall EXAM: LEFT KNEE - COMPLETE 4 VIEW COMPARISON:  None Available. FINDINGS: Osteopenia. No fracture or dislocation. There are some joint space loss of the lateral compartment. Small osteophytes. Small osteophytes along the patellofemoral joint. Chondrocalcinosis. Extensive vascular calcifications are seen. No joint effusion on lateral view. IMPRESSION: Severe osteopenia.  Degenerative changes.  Chondrocalcinosis. Electronically Signed   By: Karen Kays M.D.    On: 03/13/2023 14:01   DG Hip Unilat W or Wo Pelvis 2-3 Views Left  Result Date: 03/13/2023 CLINICAL DATA:  Left hip pain after fall. EXAM: DG HIP (WITH OR WITHOUT PELVIS) 2-3V LEFT COMPARISON:  None Available. FINDINGS: Status post left total hip arthroplasty. Diffuse osteopenia is noted. No definite fracture or dislocation is noted. Vascular calcifications are noted. IMPRESSION: No definite acute abnormality seen. Electronically Signed   By: Lupita Raider M.D.   On: 03/13/2023 13:59   DG Elbow Complete Left  Result Date: 03/13/2023 CLINICAL DATA:  Pain after fall EXAM: LEFT ELBOW - COMPLETE 4 VIEW COMPARISON:  None Available. FINDINGS: Osteopenia. No fracture or dislocation. Preserved joint spaces. No joint effusion seen on lateral view. IV site along the antecubital region. IMPRESSION: Osteopenia.  No acute osseous abnormality. Electronically Signed   By: Karen Kays M.D.   On: 03/13/2023 13:56   CT Head Wo Contrast  Result Date: 03/13/2023 CLINICAL DATA:  Fall, Coumadin EXAM: CT HEAD WITHOUT CONTRAST CT CERVICAL SPINE WITHOUT CONTRAST TECHNIQUE: Multidetector CT imaging of the head and cervical spine was performed following the  standard protocol without intravenous contrast. Multiplanar CT image reconstructions of the cervical spine were also generated. RADIATION DOSE REDUCTION: This exam was performed according to the departmental dose-optimization program which includes automated exposure control, adjustment of the mA and/or kV according to patient size and/or use of iterative reconstruction technique. COMPARISON:  None Available. FINDINGS: CT HEAD FINDINGS Brain: No evidence of acute infarction, hemorrhage, hydrocephalus, extra-axial collection or mass lesion/mass effect. Periventricular white matter hypodensity. Vascular: No hyperdense vessel or unexpected calcification. Skull: Normal. Negative for fracture or focal lesion. Sinuses/Orbits: No acute finding. Other: None. CT CERVICAL SPINE FINDINGS  Alignment: Normal. Skull base and vertebrae: Osteopenia. No acute fracture. No primary bone lesion or focal pathologic process. Soft tissues and spinal canal: No prevertebral fluid or swelling. No visible canal hematoma. Disc levels: Generally mild multilevel cervical disc space height loss and osteophytosis. Upper chest: Negative. Other: None. IMPRESSION: 1. No acute intracranial pathology. Small-vessel white matter disease. 2. Osteopenia. No fracture or static subluxation of the cervical spine. 3. Generally mild multilevel cervical disc degenerative disease. Electronically Signed   By: Jearld Lesch M.D.   On: 03/13/2023 13:23   CT Cervical Spine Wo Contrast  Result Date: 03/13/2023 CLINICAL DATA:  Fall, Coumadin EXAM: CT HEAD WITHOUT CONTRAST CT CERVICAL SPINE WITHOUT CONTRAST TECHNIQUE: Multidetector CT imaging of the head and cervical spine was performed following the standard protocol without intravenous contrast. Multiplanar CT image reconstructions of the cervical spine were also generated. RADIATION DOSE REDUCTION: This exam was performed according to the departmental dose-optimization program which includes automated exposure control, adjustment of the mA and/or kV according to patient size and/or use of iterative reconstruction technique. COMPARISON:  None Available. FINDINGS: CT HEAD FINDINGS Brain: No evidence of acute infarction, hemorrhage, hydrocephalus, extra-axial collection or mass lesion/mass effect. Periventricular white matter hypodensity. Vascular: No hyperdense vessel or unexpected calcification. Skull: Normal. Negative for fracture or focal lesion. Sinuses/Orbits: No acute finding. Other: None. CT CERVICAL SPINE FINDINGS Alignment: Normal. Skull base and vertebrae: Osteopenia. No acute fracture. No primary bone lesion or focal pathologic process. Soft tissues and spinal canal: No prevertebral fluid or swelling. No visible canal hematoma. Disc levels: Generally mild multilevel cervical  disc space height loss and osteophytosis. Upper chest: Negative. Other: None. IMPRESSION: 1. No acute intracranial pathology. Small-vessel white matter disease. 2. Osteopenia. No fracture or static subluxation of the cervical spine. 3. Generally mild multilevel cervical disc degenerative disease. Electronically Signed   By: Jearld Lesch M.D.   On: 03/13/2023 13:23   DG Pelvis Portable  Result Date: 03/13/2023 CLINICAL DATA:  Fall EXAM: PORTABLE PELVIS 1-2 VIEWS COMPARISON:  None Available. FINDINGS: Marked osteopenia. No obvious displaced fracture or dislocation of the pelvis or proximal right femur. Status post left hip total arthroplasty. No obvious perihardware fracture. Nonobstructive pattern of overlying bowel gas. Vascular calcinosis. IMPRESSION: 1. Marked osteopenia. No obvious displaced fracture or dislocation of the pelvis or proximal right femur. 2. Status post left hip total arthroplasty. No obvious perihardware fracture. 3. Plain radiographs are significantly limited in sensitivity for hip and pelvic fracture. Recommend CT if there is clinical suspicion for fracture. Electronically Signed   By: Jearld Lesch M.D.   On: 03/13/2023 13:17   DG Chest Portable 1 View  Result Date: 03/13/2023 CLINICAL DATA:  fall EXAM: PORTABLE CHEST 1 VIEW COMPARISON:  Chest radiograph 12/11/2022 FINDINGS: Asymmetric elevation of the right hemidiaphragm, unchanged from prior exam. Pleural effusion. Pneumothorax. Redemonstrated are multiple subacute to chronic bilateral rib fractures. No definite evidence of  an acute displaced rib fracture. Cardiomegaly. Hazy bibasilar airspace opacities could represent atelectasis or infection. Visualized upper abdomen is unremarkable IMPRESSION: 1. Hazy bibasilar airspace opacities could represent atelectasis or infection. 2. Redemonstrated are multiple subacute to chronic bilateral rib fractures. No definite evidence of an acute displaced rib fracture. Electronically Signed   By:  Lorenza Cambridge M.D.   On: 03/13/2023 13:11       LOS: 0 days   Immaculate Crutcher Rito Ehrlich  [facility] Pager on [email or web address]  03/14/2023, 10:22 AM

## 2023-03-14 NOTE — Evaluation (Addendum)
Physical Therapy Evaluation Patient Details Name: Stacey [name] MRN: [id number] DOB: 04/18/1933 Today's Date: 03/14/2023  History of Present Illness  The pt is an 89 yo female presenting 8/1 from [place] after a fall in which she hit her head on a dresser. Work up revealed acute L superior pubic rami fx as well as chronic bilateral rib fx and compression deformities of T10, T11, and L5.  PMH includes: vertigo, afib on anticoagulants, kyphosis, RA, CHF.   Clinical Impression  Pt in bed upon arrival of PT, agreeable to evaluation at this time. Prior to admission the pt was ambulatory with use of cane, but does have hx of 5 falls recently. The pt was living at assisted living at the time of this fall, but her daughter would like to work towards d/c home where the pt will be living with the daughter. The pt was able to complete x2 sit-stand transfers with mod-maxA and use of RW, but does need increased time to rise to standing as well as increased cues for hand placement and technique. The pt was unable to tolerate much wt on LLE, but completed 2 x 10 standing wt shifts with attempts to complete standing marches unsuccessful.   Pt limited by pain and fatigue, given amount of assist needed today, I would typically recommend continued inpatient therapies <3hrs/day, but the pt's daughter was present for session and asking to take the pt home after d/c. Therefore, we will continue to work on decreased caregiver assistance with standing, pivot transfers, and initiation of gait/steps to allow for safest d/c home (anticipate daughter will use transport chair for most mobility at the house) and max HHPT services to facilitate further improvements in pt's safety and mobility.      Addendum 14:45: notified by CM that the pt and her daughter are interested in short term rehab after d/c prior to return home with daughter's assist. Recommendations updated to reflect change.     If plan is discharge home,  recommend the following: A lot of help with walking and/or transfers;A lot of help with bathing/dressing/bathroom;Assistance with cooking/housework;Direct supervision/assist for medications management;Direct supervision/assist for financial management;Assist for transportation;Help with stairs or ramp for entrance   Can travel by private vehicle   No    Equipment Recommendations None recommended by PT (possibly hoyer lift)  Recommendations for Other Services       Functional Status Assessment Patient has had a recent decline in their functional status and demonstrates the ability to make significant improvements in function in a reasonable and predictable amount of time.     Precautions / Restrictions Precautions Precautions: Fall Restrictions Weight Bearing Restrictions: Yes LLE Weight Bearing: Weight bearing as tolerated      Mobility  Bed Mobility Overal bed mobility: Needs Assistance             General bed mobility comments: pt in chair at start and end of session    Transfers Overall transfer level: Needs assistance Equipment used: Rolling walker (2 wheels) Transfers: Sit to/from Stand Sit to Stand: Max assist, Mod assist           General transfer comment: modA on first attempt, maxA on 2nd due to fatigue and pain. slow to rise and increased time to manage hand movements and extend at hips    Ambulation/Gait             Pre-gait activities: standing wt shift and marching, limited wt acceptance on LLE. completed 2 x 10  Balance Overall balance assessment: Needs assistance Sitting-balance support: Bilateral upper extremity supported, Single extremity supported Sitting balance-Leahy Scale: Poor Sitting balance - Comments: needs assist for forward leaning Postural control: Posterior lean Standing balance support: Bilateral upper extremity supported, During functional activity Standing balance-Leahy Scale: Zero Standing balance comment: dependent  on BUE support and modA                             Pertinent Vitals/Pain Pain Assessment Pain Assessment: Faces Faces Pain Scale: Hurts whole lot Pain Location: LLE with movement, R abdomen to lesser degree Pain Descriptors / Indicators: Aching, Discomfort, Moaning, Grimacing, Guarding Pain Intervention(s): Limited activity within patient's tolerance, Monitored during session, Repositioned    Home Living Family/patient expects to be discharged to:: Private residence Living Arrangements: Children (daughter and son-in-law) Available Help at Discharge: Family;Available 24 hours/day Type of Home: House Home Access: Stairs to enter Entrance Stairs-Rails: None Entrance Stairs-Number of Steps: 1   Home Layout: One level Home Equipment: Agricultural consultant (2 wheels);Cane - single point;Toilet riser;Grab bars - tub/shower;Grab bars - toilet Additional Comments: Pt reports 5 falls in the last year, had been at assisted living, her daughter is planning to take her home with family support instead of back to assisted living    Prior Function Prior Level of Function : History of Falls (last six months);Independent/Modified Independent             Mobility Comments: ambulated with [facility] (daughter feels RW is more appropriate) ADLs Comments: Ind     Hand Dominance   Dominant Hand: Right    Extremity/Trunk Assessment   Upper Extremity Assessment Upper Extremity Assessment: Defer to OT evaluation    Lower Extremity Assessment Lower Extremity Assessment: RLE deficits/detail;LLE deficits/detail RLE Deficits / Details: grossly 3+/5 to RLE, minimal muscle bulk, frail. no change in sensation or ROM. chronic pressure ulcer R heel RLE Sensation: WNL RLE Coordination: WNL LLE Deficits / Details: limited by pain, minimal muscle bulk, good strength at ankle, limited movement of knee and hip. reports sensation intact LLE: Unable to fully assess due to pain LLE Sensation: WNL LLE  Coordination: WNL    Cervical / Trunk Assessment Cervical / Trunk Assessment: Kyphotic (significant kyphosis of trunk and forward head, pt unable to extend)  Communication   Communication: No difficulties  Cognition Arousal/Alertness: Awake/alert Behavior During Therapy: WFL for tasks assessed/performed Overall Cognitive Status: Within Functional Limits for tasks assessed                                 General Comments: pt asking to wait until she has healed more but agreeable after increased explanation and encouragement        General Comments General comments (skin integrity, edema, etc.): HR in afib, 100-120bpm    Exercises     Assessment/Plan    PT Assessment Patient needs continued PT services  PT Problem List Decreased strength;Decreased range of motion;Decreased balance;Decreased activity tolerance;Decreased mobility;Pain       PT Treatment Interventions DME instruction;Gait training;Stair training;Functional mobility training;Therapeutic activities;Therapeutic exercise;Balance training;Patient/family education    PT Goals (Current goals can be found in the Care Plan section)  Acute Rehab PT Goals Patient Stated Goal: return to daughter's house PT Goal Formulation: With patient/family Time For Goal Achievement: 03/28/23 Potential to Achieve Goals: Good    Frequency Min 1X/week        AM-PAC  PT "6 Clicks" Mobility  Outcome Measure Help needed turning from your back to your side while in a flat bed without using bedrails?: A Lot Help needed moving from lying on your back to sitting on the side of a flat bed without using bedrails?: A Lot Help needed moving to and from a bed to a chair (including a wheelchair)?: Total Help needed standing up from a chair using your arms (e.g., wheelchair or bedside chair)?: A Lot Help needed to walk in hospital room?: Total Help needed climbing 3-5 steps with a railing? : Total 6 Click Score: 9    End of  Session Equipment Utilized During Treatment: Gait belt Activity Tolerance: Patient tolerated treatment well;Patient limited by pain Patient left: in chair;with call bell/phone within reach;with chair alarm set;with family/visitor present Nurse Communication: Mobility status PT Visit Diagnosis: Other abnormalities of gait and mobility (R26.89);Muscle weakness (generalized) (M62.81);History of falling (Z91.81);Pain Pain - Right/Left: Left Pain - part of body: Hip;Leg    Time: 1200-1230 PT Time Calculation (min) (ACUTE ONLY): 30 min   Charges:   PT Evaluation $PT Eval Low Complexity: 1 Low PT Treatments $Therapeutic Exercise: 8-22 mins PT General Charges $$ ACUTE PT VISIT: 1 Visit         Vickki Muff, PT, DPT   Acute Rehabilitation Department Office [phone] Secure Chat Communication Preferred  Ronnie Derby 03/14/2023, 3:29 PM

## 2023-03-14 NOTE — TOC Progression Note (Signed)
Transition of Care [facility]) - Progression Note    Patient Details  Name: Stacey [name] MRN: [id number] Date of Birth: [birth year]  Transition of [facility]) CM/SW Contact  Lorri Frederick, LCSW Phone Number: 03/14/2023, 3:48 PM  Clinical Narrative:    Referral sent out in hub for SNF.   Expected Discharge Plan: Skilled Nursing Facility Barriers to Discharge: Continued Medical Work up (pain control)  Expected Discharge Plan and Services   Discharge Planning Services: CM Consult   Living arrangements for the past 2 months: Assisted Living Facility [place])                 DME Arranged: N/A                     Social Determinants of Health (SDOH) Interventions SDOH Screenings   Food Insecurity: No Food Insecurity (03/13/2023)  Housing: Low Risk  (03/13/2023)  Transportation Needs: No Transportation Needs (03/13/2023)  Utilities: Not At Risk (03/13/2023)  Alcohol Screen: Low Risk  (08/30/2020)  Depression (PHQ2-9): Low Risk  (12/11/2022)  Financial Resource Strain: Low Risk  (08/31/2021)  Physical Activity: Inactive (08/31/2021)  Social Connections: Socially Isolated (08/31/2021)  Stress: No Stress Concern Present (08/31/2021)  Tobacco Use: Medium Risk (03/13/2023)    Readmission Risk Interventions     No data to display

## 2023-03-14 NOTE — NC FL2 (Signed)
[place] MEDICAID FL2 LEVEL OF CARE FORM     IDENTIFICATION  Patient Name: Stacey [name] Birthdate: 1932/10/24 Sex: female Admission Date (Current Location): 03/13/2023  [facility] and [place] Number:  Producer, television/film/video and Address:  [facility] [place], [address], [place], [place] [address]      Provider Number: [id number]  Attending Physician Name and Address:  Osvaldo Shipper, MD  Relative Name and Phone Number:  Cornish,Janice Daughter [phone]    Current Level of Care: Hospital Recommended Level of Care: Skilled Nursing Facility Prior Approval Number:    Date Approved/Denied:   PASRR Number: [id number]  Discharge Plan: SNF    Current Diagnoses: Patient Active Problem List   Diagnosis Date Noted   Closed fracture of left superior pubic ramus (HCC) 03/13/2023   Long term systemic steroid user 06/18/2021   Pain in limb 06/18/2021   Primary osteoarthritis 06/18/2021   Radicular pain 06/18/2021   Sciatica 06/18/2021   Shoulder joint pain 06/18/2021   Presbycusis of both ears 11/21/2020   Rheumatoid arthritis involving multiple sites (HCC) 03/28/2020   History of total hip replacement, left 03/28/2020   PVD (peripheral vascular disease) (HCC) 11/20/2019   Osteopenia 11/20/2019   Stenosis of both subclavian arteries (HCC) 05/17/2019   Conductive hearing loss, bilateral 07/03/2018   Bilateral impacted cerumen 07/03/2018   Acute swimmer's ear of left side 07/03/2018   Long term (current) use of anticoagulants [Z79.01] 07/25/2017   Chronic atrial fibrillation (HCC) 07/11/2017   Primary osteoarthritis involving multiple joints 07/11/2017   Pseudogout 07/11/2017   Neuropathy 07/11/2017   Lymphedema 07/11/2017   Kyphosis of cervical region 07/11/2017   Chronic heart failure with preserved ejection fraction (HFpEF) (HCC) 07/11/2017    Orientation RESPIRATION BLADDER Height & Weight     Self, Situation, Place  Normal Incontinent  Weight:   Height:     BEHAVIORAL SYMPTOMS/MOOD NEUROLOGICAL BOWEL NUTRITION STATUS      Continent Diet (see discharge summary)  AMBULATORY STATUS COMMUNICATION OF NEEDS Skin   Total Care Verbally Normal                       Personal Care Assistance Level of Assistance  Bathing, Feeding, Dressing Bathing Assistance: Maximum assistance Feeding assistance: Independent Dressing Assistance: Maximum assistance     Functional Limitations Info  Sight, Hearing, Speech Sight Info: Adequate Hearing Info: Impaired Speech Info: Adequate    SPECIAL CARE FACTORS FREQUENCY  PT (By licensed PT), OT (By licensed OT)     PT Frequency: 5x week OT Frequency: 5x week            Contractures Contractures Info: Not present    Additional Factors Info  Code Status, Allergies Code Status Info: full Allergies Info: Sulfa Antibiotics, Tape, Codeine, Hydroxychloroquine, Shellfish-derived Products, Latex, Neosporin Original (Bacitracin-neomycin-polymyxin), Polysporin (Bacitracin-polymyxin B)           Current Medications (03/14/2023):  This is the current hospital active medication list Current Facility-Administered Medications  Medication Dose Route Frequency Provider Last Rate Last Admin   acetaminophen (TYLENOL) tablet 650 mg  650 mg Oral Q6H PRN Hillary Bow, DO       Or   acetaminophen (TYLENOL) suppository 650 mg  650 mg Rectal Q6H PRN Hillary Bow, DO       atorvastatin (LIPITOR) tablet 10 mg  10 mg Oral Daily Lyda Perone M, DO   10 mg at 03/14/23 1126   cholecalciferol (VITAMIN D3) 25  MCG (1000 UNIT) tablet 1,000 Units  1,000 Units Oral Daily Hillary Bow, DO   1,000 Units at 03/14/23 1126   gabapentin (NEURONTIN) capsule 400 mg  400 mg Oral Daily Lyda Perone M, DO   400 mg at 03/14/23 1126   loratadine (CLARITIN) tablet 10 mg  10 mg Oral Daily Lyda Perone M, DO   10 mg at 03/14/23 1127   metoprolol tartrate (LOPRESSOR) tablet 50 mg  50 mg Oral BID Hillary Bow, DO   50 mg at 03/14/23 1146   multivitamin with minerals tablet 1 tablet  1 tablet Oral Daily Lyda Perone M, DO   1 tablet at 03/14/23 1126   ondansetron (ZOFRAN) tablet 4 mg  4 mg Oral Q6H PRN Hillary Bow, DO       Or   ondansetron [facility]) injection 4 mg  4 mg Intravenous Q6H PRN Hillary Bow, DO       pantoprazole (PROTONIX) EC tablet 40 mg  40 mg Oral Daily Lyda Perone M, DO   40 mg at 03/14/23 1127   predniSONE (DELTASONE) tablet 5 mg  5 mg Oral Daily Lyda Perone M, DO   5 mg at 03/14/23 1126   sertraline (ZOLOFT) tablet 100 mg  100 mg Oral Daily Lyda Perone M, DO   100 mg at 03/14/23 1126   traMADol (ULTRAM) tablet 50 mg  50 mg Oral TID Bell, Lorin C, RPH   50 mg at 03/14/23 1127   warfarin (COUMADIN) tablet 5 mg  5 mg Oral ONCE-1600 Katsaros, Marcia Brash, [facility]       Warfarin - Pharmacist Dosing Inpatient   Does not apply q1600 Ellis Savage, [facility]         Discharge Medications: Please see discharge summary for a list of discharge medications.  Relevant Imaging Results:  Relevant Lab Results:   Additional Information SSN: [id number]  Lorri Frederick, LCSW

## 2023-03-14 NOTE — TOC Initial Note (Signed)
Transition of Care [facility]) - Initial/Assessment Note    Patient Details  Name: Stacey [name] MRN: [id number] Date of Birth: 09-19-1932  Transition of Care [facility]) CM/SW Contact:    Kingsley Plan, RN Phone Number: 03/14/2023, 2:52 PM  Clinical Narrative:                  Spoke to patient and daughter at bedside.   Prior to admission patient was living at ALF [facility]. Daughter was planning on moving patient back to her home ( daughter's home) on Monday .   Patient and daughter would both like patient to have short term rehab at a SNF at discharge.   Medicare obs letter given  Patient is [facility]     Secure chatted team including SW   (transport chair, walker, shower seat, and will be installing grab bars in bathroom  Expected Discharge Plan: Skilled Nursing Facility Barriers to Discharge: Continued Medical Work up (pain control)   Patient Goals and CMS Choice Patient states their goals for this hospitalization and ongoing recovery are:: short term rehab          Expected Discharge Plan and Services   Discharge Planning Services: CM Consult   Living arrangements for the past 2 months: Assisted Living Facility [place])                 DME Arranged: N/A                    Prior Living Arrangements/Services Living arrangements for the past 2 months: Assisted Living Facility [employer]) Lives with:: Self Patient language and need for interpreter reviewed:: Yes            Current home services: DME Criminal Activity/Legal Involvement Pertinent to Current Situation/Hospitalization: No - Comment as needed  Activities of Daily Living Home Assistive Devices/Equipment: Cane (specify quad or straight), Walker (specify type) ADL Screening (condition at time of admission) Patient's cognitive ability adequate to safely complete daily activities?: Yes Is the patient deaf or have difficulty hearing?: No Does the patient have difficulty seeing, even when  wearing glasses/contacts?: No Does the patient have difficulty concentrating, remembering, or making decisions?: No Patient able to express need for assistance with ADLs?: Yes Does the patient have difficulty dressing or bathing?: No Independently performs ADLs?: No Does the patient have difficulty walking or climbing stairs?: Yes Weakness of Legs: Both Weakness of Arms/Hands: Both  Permission Sought/Granted   Permission granted to share information with : Yes, Verbal Permission Granted  Share Information with NAME: daughter [name]           Emotional Assessment   Attitude/Demeanor/Rapport: Engaged Affect (typically observed): Accepting Orientation: : Oriented to Self, Oriented to Place, Oriented to  Time, Oriented to Situation Alcohol / Substance Use: Not Applicable Psych Involvement: No (comment)  Admission diagnosis:  Fall, initial encounter [W19.XXXA] Closed fracture of left superior pubic ramus (HCC) [S32.512A] Patient Active Problem List   Diagnosis Date Noted   Closed fracture of left superior pubic ramus (HCC) 03/13/2023   Long term systemic steroid user 06/18/2021   Pain in limb 06/18/2021   Primary osteoarthritis 06/18/2021   Radicular pain 06/18/2021   Sciatica 06/18/2021   Shoulder joint pain 06/18/2021   Presbycusis of both ears 11/21/2020   Rheumatoid arthritis involving multiple sites (HCC) 03/28/2020   History of total hip replacement, left 03/28/2020   PVD (peripheral vascular disease) (HCC) 11/20/2019   Osteopenia 11/20/2019   Stenosis of both subclavian arteries (HCC)  05/17/2019   Conductive hearing loss, bilateral 07/03/2018   Bilateral impacted cerumen 07/03/2018   Acute swimmer's ear of left side 07/03/2018   Long term (current) use of anticoagulants [Z79.01] 07/25/2017   Chronic atrial fibrillation (HCC) 07/11/2017   Primary osteoarthritis involving multiple joints 07/11/2017   Pseudogout 07/11/2017   Neuropathy 07/11/2017   Lymphedema  07/11/2017   Kyphosis of cervical region 07/11/2017   Chronic heart failure with preserved ejection fraction (HFpEF) (HCC) 07/11/2017   PCP:  [name], MD Pharmacy:   [facility] [phone] [facility] - [place], [place] - [address] [place] [address] Phone: [phone] Fax: [phone]     Social Determinants of Health (SDOH) Social History: SDOH Screenings   Food Insecurity: No Food Insecurity (03/13/2023)  Housing: Low Risk  (03/13/2023)  Transportation Needs: No Transportation Needs (03/13/2023)  Utilities: Not At Risk (03/13/2023)  Alcohol Screen: Low Risk  (08/30/2020)  Depression (PHQ2-9): Low Risk  (12/11/2022)  Financial Resource Strain: Low Risk  (08/31/2021)  Physical Activity: Inactive (08/31/2021)  Social Connections: Socially Isolated (08/31/2021)  Stress: No Stress Concern Present (08/31/2021)  Tobacco Use: Medium Risk (03/13/2023)   SDOH Interventions:     Readmission Risk Interventions     No data to display

## 2023-03-15 DIAGNOSIS — E43 Unspecified severe protein-calorie malnutrition: Secondary | ICD-10-CM | POA: Diagnosis present

## 2023-03-15 DIAGNOSIS — M069 Rheumatoid arthritis, unspecified: Secondary | ICD-10-CM | POA: Diagnosis present

## 2023-03-15 DIAGNOSIS — R531 Weakness: Secondary | ICD-10-CM | POA: Diagnosis not present

## 2023-03-15 DIAGNOSIS — R2689 Other abnormalities of gait and mobility: Secondary | ICD-10-CM | POA: Diagnosis not present

## 2023-03-15 DIAGNOSIS — Z87891 Personal history of nicotine dependence: Secondary | ICD-10-CM | POA: Diagnosis not present

## 2023-03-15 DIAGNOSIS — Z91013 Allergy to seafood: Secondary | ICD-10-CM | POA: Diagnosis not present

## 2023-03-15 DIAGNOSIS — Z91048 Other nonmedicinal substance allergy status: Secondary | ICD-10-CM | POA: Diagnosis not present

## 2023-03-15 DIAGNOSIS — S32592A Other specified fracture of left pubis, initial encounter for closed fracture: Secondary | ICD-10-CM | POA: Diagnosis present

## 2023-03-15 DIAGNOSIS — M8448XA Pathological fracture, other site, initial encounter for fracture: Secondary | ICD-10-CM | POA: Diagnosis present

## 2023-03-15 DIAGNOSIS — E785 Hyperlipidemia, unspecified: Secondary | ICD-10-CM | POA: Diagnosis not present

## 2023-03-15 DIAGNOSIS — W19XXXA Unspecified fall, initial encounter: Secondary | ICD-10-CM | POA: Diagnosis present

## 2023-03-15 DIAGNOSIS — M810 Age-related osteoporosis without current pathological fracture: Secondary | ICD-10-CM | POA: Diagnosis present

## 2023-03-15 DIAGNOSIS — Z681 Body mass index (BMI) 19 or less, adult: Secondary | ICD-10-CM | POA: Diagnosis not present

## 2023-03-15 DIAGNOSIS — D649 Anemia, unspecified: Secondary | ICD-10-CM | POA: Diagnosis present

## 2023-03-15 DIAGNOSIS — M4854XA Collapsed vertebra, not elsewhere classified, thoracic region, initial encounter for fracture: Secondary | ICD-10-CM | POA: Diagnosis present

## 2023-03-15 DIAGNOSIS — Z7952 Long term (current) use of systemic steroids: Secondary | ICD-10-CM | POA: Diagnosis not present

## 2023-03-15 DIAGNOSIS — S32512D Fracture of superior rim of left pubis, subsequent encounter for fracture with routine healing: Secondary | ICD-10-CM | POA: Diagnosis not present

## 2023-03-15 DIAGNOSIS — E871 Hypo-osmolality and hyponatremia: Secondary | ICD-10-CM | POA: Diagnosis present

## 2023-03-15 DIAGNOSIS — K449 Diaphragmatic hernia without obstruction or gangrene: Secondary | ICD-10-CM | POA: Diagnosis present

## 2023-03-15 DIAGNOSIS — R54 Age-related physical debility: Secondary | ICD-10-CM | POA: Diagnosis present

## 2023-03-15 DIAGNOSIS — I482 Chronic atrial fibrillation, unspecified: Secondary | ICD-10-CM | POA: Diagnosis present

## 2023-03-15 DIAGNOSIS — Z91040 Latex allergy status: Secondary | ICD-10-CM | POA: Diagnosis not present

## 2023-03-15 DIAGNOSIS — Y92099 Unspecified place in other non-institutional residence as the place of occurrence of the external cause: Secondary | ICD-10-CM | POA: Diagnosis not present

## 2023-03-15 DIAGNOSIS — Z8249 Family history of ischemic heart disease and other diseases of the circulatory system: Secondary | ICD-10-CM | POA: Diagnosis not present

## 2023-03-15 DIAGNOSIS — G629 Polyneuropathy, unspecified: Secondary | ICD-10-CM | POA: Diagnosis not present

## 2023-03-15 DIAGNOSIS — Z7401 Bed confinement status: Secondary | ICD-10-CM | POA: Diagnosis not present

## 2023-03-15 DIAGNOSIS — Z9181 History of falling: Secondary | ICD-10-CM | POA: Diagnosis not present

## 2023-03-15 DIAGNOSIS — Z885 Allergy status to narcotic agent status: Secondary | ICD-10-CM | POA: Diagnosis not present

## 2023-03-15 DIAGNOSIS — M6281 Muscle weakness (generalized): Secondary | ICD-10-CM | POA: Diagnosis not present

## 2023-03-15 DIAGNOSIS — Z888 Allergy status to other drugs, medicaments and biological substances status: Secondary | ICD-10-CM | POA: Diagnosis not present

## 2023-03-15 DIAGNOSIS — S32512A Fracture of superior rim of left pubis, initial encounter for closed fracture: Secondary | ICD-10-CM | POA: Diagnosis not present

## 2023-03-15 DIAGNOSIS — S2243XA Multiple fractures of ribs, bilateral, initial encounter for closed fracture: Secondary | ICD-10-CM | POA: Diagnosis present

## 2023-03-15 DIAGNOSIS — I5032 Chronic diastolic (congestive) heart failure: Secondary | ICD-10-CM | POA: Diagnosis present

## 2023-03-15 DIAGNOSIS — R41841 Cognitive communication deficit: Secondary | ICD-10-CM | POA: Diagnosis not present

## 2023-03-15 DIAGNOSIS — M4856XA Collapsed vertebra, not elsewhere classified, lumbar region, initial encounter for fracture: Secondary | ICD-10-CM | POA: Diagnosis present

## 2023-03-15 DIAGNOSIS — K219 Gastro-esophageal reflux disease without esophagitis: Secondary | ICD-10-CM | POA: Diagnosis not present

## 2023-03-15 DIAGNOSIS — Z882 Allergy status to sulfonamides status: Secondary | ICD-10-CM | POA: Diagnosis not present

## 2023-03-15 MED ORDER — WARFARIN SODIUM 2.5 MG PO TABS
2.5000 mg | ORAL_TABLET | Freq: Once | ORAL | Status: AC
  Administered 2023-03-15: 2.5 mg via ORAL
  Filled 2023-03-15: qty 1

## 2023-03-15 MED ORDER — SODIUM CHLORIDE 0.9 % IV SOLN: INTRAVENOUS | Status: AC

## 2023-03-15 MED ORDER — POTASSIUM CHLORIDE CRYS ER 20 MEQ PO TBCR
40.0000 meq | EXTENDED_RELEASE_TABLET | Freq: Once | ORAL | Status: AC
  Administered 2023-03-15: 40 meq via ORAL
  Filled 2023-03-15: qty 2

## 2023-03-15 NOTE — TOC CAGE-AID Note (Signed)
Transition of Care [facility]) - CAGE-AID Screening  Patient Details  Name: Stacey [name] MRN: [id number] Date of Birth: Nov 24, 1932  Clinical Narrative:  Patient denies any alcohol or drug use, previous cigarette smoker but has quit. No need for substance abuse resources at this time.  CAGE-AID Screening:   Have You Ever Felt You Ought to Cut Down on Your Drinking or Drug Use?: No Have People Annoyed You By Critizing Your Drinking Or Drug Use?: No Have You Felt Bad Or Guilty About Your Drinking Or Drug Use?: No Have You Ever Had a Drink or Used Drugs First Thing In The Morning to Steady Your Nerves or to Get Rid of a Hangover?: No CAGE-AID Score: 0  Substance Abuse Education Offered: No

## 2023-03-15 NOTE — Plan of Care (Signed)
  Problem: Activity: Goal: Risk for activity intolerance will decrease Outcome: Progressing   Problem: Pain Managment: Goal: General experience of comfort will improve Outcome: Progressing   Problem: Safety: Goal: Ability to remain free from injury will improve Outcome: Progressing   

## 2023-03-15 NOTE — Progress Notes (Addendum)
ANTICOAGULATION CONSULT NOTE  Pharmacy Consult for Warfarin Indication: atrial fibrillation  Allergies  Allergen Reactions   Sulfa Antibiotics Anaphylaxis   Tape Other (See Comments)    BRUISES VERY EASILY!!!!   Codeine Nausea And Vomiting   Hydroxychloroquine     Other reaction(s): GI upset   Shellfish-Derived Products Other (See Comments)    Daughter was unsure of the reaction- "happened years ago" and patient avoids foods that contain shellfish   Latex Rash   Neosporin Original [Bacitracin-Neomycin-Polymyxin] Rash   Polysporin [Bacitracin-Polymyxin B] Rash    Patient Measurements:   Vital Signs: Temp: 98.1 F (36.7 C) (08/03 0532) Temp Source: Oral (08/03 0532) BP: 129/88 (08/03 0532) Pulse Rate: 109 (08/03 0532)  Labs: Recent Labs    03/13/23 2101 03/13/23 2351 03/15/23 0045  HGB 10.4*  --  10.6*  HCT 31.9*  --  32.7*  PLT 173  --  183  LABPROT 22.3* 20.8* 22.0*  INR 1.9* 1.8* 1.9*  CREATININE 0.79  --  0.82    CrCl cannot be calculated (Unknown ideal weight.).   Medical History: Past Medical History:  Diagnosis Date   Atrial fibrillation (HCC)    Hernia of abdominal cavity    Kyphosis     Assessment: 26 YOF admitted with hip fracture planning non-operative management. On warfarin PTA for atrial fibrillation. Pharmacy consulted to dose while inpatient.   PTA regimen 5mg  Thurs and 2.5mg  all other days  8/2: Spoke with patient and daughter and confirmed that patient took home dose of 5 mg on 8/1, which was not documented by RN. INR remains subtherapeutic at 1.9 last night. CBC stable and no signs of bleeding noted.  8/3: Hgb and PLT stable today. No noted concerns for bleeding. INR 1.9 again today.  Goal of Therapy:  INR 2-3 Monitor platelets by anticoagulation protocol: Yes   Plan:  Resume PTA Warfarin regimen today, give 2.5 mg x 1 today. Daily INR Monitor CBC and s/sx of bleeding  Lora Paula, PharmD PGY-2 Infectious Diseases Pharmacy  Resident 03/15/2023 7:59 AM

## 2023-03-15 NOTE — Plan of Care (Signed)
  Problem: Health Behavior/Discharge Planning: Goal: Ability to manage health-related needs will improve Outcome: Progressing   Problem: Clinical Measurements: Goal: Respiratory complications will improve Outcome: Progressing   Problem: Activity: Goal: Risk for activity intolerance will decrease Outcome: Progressing   Problem: Pain Managment: Goal: General experience of comfort will improve Outcome: Progressing   Problem: Safety: Goal: Ability to remain free from injury will improve Outcome: Progressing   

## 2023-03-15 NOTE — Progress Notes (Addendum)
[facility] PROGRESS NOTE   Stacey [name] ZOX:[id number] DOB: 1933/03/14 DOA: 03/13/2023  PCP: [name], MD  Brief History/Interval Summary: 89 y.o. female with medical history significant of A.Fib on coumadin, RA, chronic use of prednisone etiology, history of severe osteoporosis.  Patient presented after mechanical fall at her assisted living facility.  Evaluated in the emergency department.  Found to have pubic ramus fracture.  Was hospitalized for further management due to pain issues and immobility.  Consultants: Phone discussion with orthopedics by EDP  Procedures: None    Subjective/Interval History: Patient mentions that the pain in her pelvic area is much better today compared to yesterday.  Denies any chest pain shortness of breath.  Denies any palpitations this morning.     Assessment/Plan:  Left pubic ramus fracture. This was discussed with orthopedics.  To be managed conservatively.  No indication for surgical intervention. Pain seems to be getting better.  Continue pain medications as needed.  Bowel regimen. Patient is on tramadol at home which is being continued. PT and OT elevation.  Chronic atrial fibrillation, with RVR Noted to be on metoprolol and warfarin which is being continued.  Heart rate noted to be elevated this morning.  Home medication list reviewed.  She is not on any other rate limiting drugs.  Noted to be more tachycardic in the last 24 hours.  Patient denies any new symptoms. Perhaps she is hypovolemic as she does have hyponatremia today.  Will give her gentle IV hydration for 12 hours and continue to monitor her on telemetry.   Pharmacy is managing warfarin.  INR is 1.9 today.  Hyponatremia Could be due to hypovolemia.  Gentle IV hydration and recheck labs tomorrow.  Long-term systemic steroid use Patient has been on prednisone for 25 years.  She does not know the reason for it though she does mention that she has a history of  rheumatoid arthritis.  Chronic diastolic CHF Euvolemic.  Normocytic anemia No evidence of overt bleeding.  Hemoglobin is stable  Compression deformities of T10, T11 and L5 Thought to be chronic.  Incidentally noted on CT scan.  Large hiatal hernia Continue PPI  Bilateral rib fractures Chronic.  Likely due to falls and osteoporosis.  Asymptomatic.   DVT Prophylaxis: On Coumadin Code Status: Full code Family Communication: No family at bedside Disposition Plan: SNF     Medications: Scheduled:  atorvastatin  10 mg Oral Daily   cholecalciferol  1,000 Units Oral Daily   gabapentin  400 mg Oral Daily   loratadine  10 mg Oral Daily   metoprolol tartrate  50 mg Oral BID   multivitamin with minerals  1 tablet Oral Daily   pantoprazole  40 mg Oral Daily   potassium chloride  40 mEq Oral Once   predniSONE  5 mg Oral Daily   sertraline  100 mg Oral Daily   traMADol  50 mg Oral TID   warfarin  2.5 mg Oral ONCE-1600   Warfarin - Pharmacist Dosing Inpatient   Does not apply q1600   Continuous:  sodium chloride     WJX:BJYNWGNFAOZHY **OR** acetaminophen, ondansetron **OR** ondansetron (ZOFRAN) IV  Antibiotics: Anti-infectives (From admission, onward)    None       Objective:  Vital Signs  Vitals:   03/14/23 1640 03/14/23 2339 03/15/23 0532 03/15/23 0841  BP: 114/80 123/83 129/88 101/79  Pulse: (!) 104 (!) 106 (!) 109 (!) 106  Resp: 17 16 16 18   Temp: 99.5 F (37.5 C) 99.2  F (37.3 C) 98.1 F (36.7 C) (!) 97.4 F (36.3 C)  TempSrc: Oral Oral Oral Oral  SpO2: 95% 95% 93% 91%    General appearance: Awake alert.  In no distress Resp: Clear to auscultation bilaterally.  Normal effort Cardio: S1-S2 is irregularly irregular, tachycardic GI: Abdomen is soft.  Nontender nondistended.  Bowel sounds are present normal.  No masses organomegaly Extremities: No edema.  Improved range of motion of the lower extremities Neurologic: Alert and oriented x3.  No focal  neurological deficits.    Lab Results:  Data Reviewed: I have personally reviewed following labs and reports of the imaging studies  CBC: Recent Labs  Lab 03/13/23 2101 03/15/23 0045  WBC 7.4 9.8  NEUTROABS 6.0  --   HGB 10.4* 10.6*  HCT 31.9* 32.7*  MCV 94.1 90.1  PLT 173 183    Basic Metabolic Panel: Recent Labs  Lab 03/13/23 2101 03/15/23 0045  NA 134* 131*  K 3.8 3.5  CL 95* 94*  CO2 29 26  GLUCOSE 103* 127*  BUN 10 8  CREATININE 0.79 0.82  CALCIUM 8.9 8.8*  MG 1.9  --     GFR: CrCl cannot be calculated (Unknown ideal weight.).  Liver Function Tests: Recent Labs  Lab 03/13/23 2101  AST 34  ALT 24  ALKPHOS 72  BILITOT 1.3*  PROT 6.0*  ALBUMIN 3.2*     Coagulation Profile: Recent Labs  Lab 03/12/23 0000 03/13/23 2101 03/13/23 2351 03/15/23 0045  INR 2.0 1.9* 1.8* 1.9*      Recent Results (from the past 240 hour(s))  MRSA Next Gen by PCR, Nasal     Status: None   Collection Time: 03/14/23  6:48 AM   Specimen: Nasal Mucosa; Nasal Swab  Result Value Ref Range Status   MRSA by PCR Next Gen NOT DETECTED NOT DETECTED Final    Comment: (NOTE) The GeneXpert MRSA Assay (FDA approved for NASAL specimens only), is one component of a comprehensive MRSA colonization surveillance program. It is not intended to diagnose MRSA infection nor to guide or monitor treatment for MRSA infections. Test performance is not FDA approved in patients less than 64 years old. Performed at [facility], [address], [place], [place] [address]       Radiology Studies: CT NO CHARGE  Result Date: 03/13/2023 CLINICAL DATA:  Hip pain, stress fracture suspected. EXAM: CT ADDITIONAL VIEWS AT NO CHARGE TECHNIQUE: Axial, coronal and sagittal osseous reformats of the pelvis were submitted from CT abdomen and pelvis. CONTRAST:  No additional COMPARISON:  CT abdomen and pelvis 03/13/2023 FINDINGS: Osseous: The bones are diffusely osteopenic. There is an acute  nondisplaced fracture of the left superior pubic ramus. There is no evidence for dislocation. There also findings suspicious for nondisplaced fracture of the anterior left sacral ala. There is mild asymmetric widening of the left sacroiliac joint anteriorly measuring up to 5 mm (right-sided measures 2 mm). There is a left hip total arthroplasty in anatomic alignment. No evidence for pubic diastasis. There are moderate degenerative changes of the right hip with joint space narrowing and osteophyte formation. There is mild compression deformity of the superior endplate of L5 which is favored as chronic. Soft tissues: No focal soft tissue hematoma identified. Peripheral vascular calcifications are present. IMPRESSION: 1. Acute nondisplaced fracture of the left superior pubic ramus. 2. Findings suspicious for acute nondisplaced fracture of the anterior left sacral ala. 3. Mild asymmetric widening of the left sacroiliac joint anteriorly. 4. Left hip  total arthroplasty in anatomic alignment. 5. Moderate degenerative changes of the right hip. 6. Mild compression deformity of the superior endplate of L5 is favored as chronic. Electronically Signed   By: Darliss Cheney M.D.   On: 03/13/2023 19:03   CT ABDOMEN PELVIS WO CONTRAST  Result Date: 03/13/2023 CLINICAL DATA:  Diarrhea EXAM: CT ABDOMEN AND PELVIS WITHOUT CONTRAST TECHNIQUE: Multidetector CT imaging of the abdomen and pelvis was performed following the standard protocol without IV contrast. RADIATION DOSE REDUCTION: This exam was performed according to the departmental dose-optimization program which includes automated exposure control, adjustment of the mA and/or kV according to patient size and/or use of iterative reconstruction technique. COMPARISON:  MRI lumbar spine 02/20/2021. Lumbar spine x-ray 05/11/2021. FINDINGS: Lower chest: There is atelectasis or scarring in the lung bases. The heart is enlarged. Hepatobiliary: No focal liver abnormality is seen. No  gallstones, gallbladder wall thickening, or biliary dilatation. Pancreas: Unremarkable. No pancreatic ductal dilatation or surrounding inflammatory changes. Spleen: Normal in size without focal abnormality. Adrenals/Urinary Tract: Adrenal glands are unremarkable. Kidneys are normal, without renal calculi, focal lesion, or hydronephrosis. Bladder is unremarkable. Stomach/Bowel: There is a large hiatal hernia containing the proximal stomach. The stomach is nondilated. No evidence of bowel wall thickening, distention, or inflammatory changes. The appendix is not seen. There is sigmoid colon diverticulosis. Vascular/Lymphatic: There severe atherosclerotic calcifications of the aorta and branch vessels. Peripheral vascular calcifications are present. No evidence for abdominal aortic aneurysm. No enlarged lymph nodes are seen. Reproductive: Status post hysterectomy. No adnexal masses. Other: The bones are diffusely osteopenic. Musculoskeletal: The bones are markedly osteopenic. There is levoconvex scoliosis of the lumbar spine with multilevel degenerative change. There are compression deformities of T10 and T11 which are incompletely imaged favored as chronic. There is also mild compression deformity of the superior endplate of L5 which is new from 2022, but favored as chronic. Recommend correlation with point tenderness. Left hip arthroplasty is present. There is an acute nondisplaced fracture of the left superior pubic ramus. IMPRESSION: 1. Acute nondisplaced fracture of the left superior pubic ramus. 2. Compression deformities of T10, T11, and L5 are favored as chronic. Recommend correlation with point tenderness. 3. Large hiatal hernia. 4. Cardiomegaly. 5. Sigmoid colon diverticulosis. Electronically Signed   By: Darliss Cheney M.D.   On: 03/13/2023 18:10   DG Knee Complete 4 Views Left  Result Date: 03/13/2023 CLINICAL DATA:  Pain after fall EXAM: LEFT KNEE - COMPLETE 4 VIEW COMPARISON:  None Available. FINDINGS:  Osteopenia. No fracture or dislocation. There are some joint space loss of the lateral compartment. Small osteophytes. Small osteophytes along the patellofemoral joint. Chondrocalcinosis. Extensive vascular calcifications are seen. No joint effusion on lateral view. IMPRESSION: Severe osteopenia.  Degenerative changes.  Chondrocalcinosis. Electronically Signed   By: Karen Kays M.D.   On: 03/13/2023 14:01   DG Hip Unilat W or Wo Pelvis 2-3 Views Left  Result Date: 03/13/2023 CLINICAL DATA:  Left hip pain after fall. EXAM: DG HIP (WITH OR WITHOUT PELVIS) 2-3V LEFT COMPARISON:  None Available. FINDINGS: Status post left total hip arthroplasty. Diffuse osteopenia is noted. No definite fracture or dislocation is noted. Vascular calcifications are noted. IMPRESSION: No definite acute abnormality seen. Electronically Signed   By: Lupita Raider M.D.   On: 03/13/2023 13:59   DG Elbow Complete Left  Result Date: 03/13/2023 CLINICAL DATA:  Pain after fall EXAM: LEFT ELBOW - COMPLETE 4 VIEW COMPARISON:  None Available. FINDINGS: Osteopenia. No fracture or dislocation. Preserved joint  spaces. No joint effusion seen on lateral view. IV site along the antecubital region. IMPRESSION: Osteopenia.  No acute osseous abnormality. Electronically Signed   By: Karen Kays M.D.   On: 03/13/2023 13:56   CT Head Wo Contrast  Result Date: 03/13/2023 CLINICAL DATA:  Fall, Coumadin EXAM: CT HEAD WITHOUT CONTRAST CT CERVICAL SPINE WITHOUT CONTRAST TECHNIQUE: Multidetector CT imaging of the head and cervical spine was performed following the standard protocol without intravenous contrast. Multiplanar CT image reconstructions of the cervical spine were also generated. RADIATION DOSE REDUCTION: This exam was performed according to the departmental dose-optimization program which includes automated exposure control, adjustment of the mA and/or kV according to patient size and/or use of iterative reconstruction technique. COMPARISON:   None Available. FINDINGS: CT HEAD FINDINGS Brain: No evidence of acute infarction, hemorrhage, hydrocephalus, extra-axial collection or mass lesion/mass effect. Periventricular white matter hypodensity. Vascular: No hyperdense vessel or unexpected calcification. Skull: Normal. Negative for fracture or focal lesion. Sinuses/Orbits: No acute finding. Other: None. CT CERVICAL SPINE FINDINGS Alignment: Normal. Skull base and vertebrae: Osteopenia. No acute fracture. No primary bone lesion or focal pathologic process. Soft tissues and spinal canal: No prevertebral fluid or swelling. No visible canal hematoma. Disc levels: Generally mild multilevel cervical disc space height loss and osteophytosis. Upper chest: Negative. Other: None. IMPRESSION: 1. No acute intracranial pathology. Small-vessel white matter disease. 2. Osteopenia. No fracture or static subluxation of the cervical spine. 3. Generally mild multilevel cervical disc degenerative disease. Electronically Signed   By: Jearld Lesch M.D.   On: 03/13/2023 13:23   CT Cervical Spine Wo Contrast  Result Date: 03/13/2023 CLINICAL DATA:  Fall, Coumadin EXAM: CT HEAD WITHOUT CONTRAST CT CERVICAL SPINE WITHOUT CONTRAST TECHNIQUE: Multidetector CT imaging of the head and cervical spine was performed following the standard protocol without intravenous contrast. Multiplanar CT image reconstructions of the cervical spine were also generated. RADIATION DOSE REDUCTION: This exam was performed according to the departmental dose-optimization program which includes automated exposure control, adjustment of the mA and/or kV according to patient size and/or use of iterative reconstruction technique. COMPARISON:  None Available. FINDINGS: CT HEAD FINDINGS Brain: No evidence of acute infarction, hemorrhage, hydrocephalus, extra-axial collection or mass lesion/mass effect. Periventricular white matter hypodensity. Vascular: No hyperdense vessel or unexpected calcification. Skull:  Normal. Negative for fracture or focal lesion. Sinuses/Orbits: No acute finding. Other: None. CT CERVICAL SPINE FINDINGS Alignment: Normal. Skull base and vertebrae: Osteopenia. No acute fracture. No primary bone lesion or focal pathologic process. Soft tissues and spinal canal: No prevertebral fluid or swelling. No visible canal hematoma. Disc levels: Generally mild multilevel cervical disc space height loss and osteophytosis. Upper chest: Negative. Other: None. IMPRESSION: 1. No acute intracranial pathology. Small-vessel white matter disease. 2. Osteopenia. No fracture or static subluxation of the cervical spine. 3. Generally mild multilevel cervical disc degenerative disease. Electronically Signed   By: Jearld Lesch M.D.   On: 03/13/2023 13:23   DG Pelvis Portable  Result Date: 03/13/2023 CLINICAL DATA:  Fall EXAM: PORTABLE PELVIS 1-2 VIEWS COMPARISON:  None Available. FINDINGS: Marked osteopenia. No obvious displaced fracture or dislocation of the pelvis or proximal right femur. Status post left hip total arthroplasty. No obvious perihardware fracture. Nonobstructive pattern of overlying bowel gas. Vascular calcinosis. IMPRESSION: 1. Marked osteopenia. No obvious displaced fracture or dislocation of the pelvis or proximal right femur. 2. Status post left hip total arthroplasty. No obvious perihardware fracture. 3. Plain radiographs are significantly limited in sensitivity for hip and pelvic fracture. Recommend  CT if there is clinical suspicion for fracture. Electronically Signed   By: Jearld Lesch M.D.   On: 03/13/2023 13:17   DG Chest Portable 1 View  Result Date: 03/13/2023 CLINICAL DATA:  fall EXAM: PORTABLE CHEST 1 VIEW COMPARISON:  Chest radiograph 12/11/2022 FINDINGS: Asymmetric elevation of the right hemidiaphragm, unchanged from prior exam. Pleural effusion. Pneumothorax. Redemonstrated are multiple subacute to chronic bilateral rib fractures. No definite evidence of an acute displaced rib  fracture. Cardiomegaly. Hazy bibasilar airspace opacities could represent atelectasis or infection. Visualized upper abdomen is unremarkable IMPRESSION: 1. Hazy bibasilar airspace opacities could represent atelectasis or infection. 2. Redemonstrated are multiple subacute to chronic bilateral rib fractures. No definite evidence of an acute displaced rib fracture. Electronically Signed   By: Lorenza Cambridge M.D.   On: 03/13/2023 13:11       LOS: 0 days   Stacey [name]  [facility] Pager on [email or web address]  03/15/2023, 9:49 AM

## 2023-03-16 DIAGNOSIS — S32512A Fracture of superior rim of left pubis, initial encounter for closed fracture: Secondary | ICD-10-CM | POA: Diagnosis not present

## 2023-03-16 LAB — BASIC METABOLIC PANEL WITH GFR
Anion gap: 8 (ref 5–15)
BUN: 6 mg/dL — ABNORMAL LOW (ref 8–23)
CO2: 26 mmol/L (ref 22–32)
Calcium: 8.7 mg/dL — ABNORMAL LOW (ref 8.9–10.3)
Chloride: 98 mmol/L (ref 98–111)
Creatinine, Ser: 0.66 mg/dL (ref 0.44–1.00)
GFR, Estimated: >60 mL/min (ref >60)
Glucose, Bld: 132 mg/dL — ABNORMAL HIGH (ref 70–99)
Potassium: 4.4 mmol/L (ref 3.5–5.1)
Sodium: 132 mmol/L — ABNORMAL LOW (ref 135–145)

## 2023-03-16 LAB — PROTIME-INR
INR: 2.5 — ABNORMAL HIGH (ref 0.8–1.2)
Prothrombin Time: 27.4 s — ABNORMAL HIGH (ref 11.4–15.2)

## 2023-03-16 LAB — MAGNESIUM: Magnesium: 2.1 mg/dL (ref 1.7–2.4)

## 2023-03-16 MED ORDER — TRAZODONE HCL 50 MG PO TABS
50.0000 mg | ORAL_TABLET | Freq: Every evening | ORAL | Status: DC | PRN
  Administered 2023-03-17: 50 mg via ORAL
  Filled 2023-03-16: qty 1

## 2023-03-16 MED ORDER — WARFARIN SODIUM 2.5 MG PO TABS
2.5000 mg | ORAL_TABLET | Freq: Once | ORAL | Status: AC
  Administered 2023-03-16: 2.5 mg via ORAL
  Filled 2023-03-16: qty 1

## 2023-03-16 NOTE — Plan of Care (Signed)
progressing 

## 2023-03-16 NOTE — Progress Notes (Signed)
   03/16/23 0935  Assess: MEWS Score  Temp 98 F (36.7 C)  BP 116/88  MAP (mmHg) 99  Pulse Rate (!) 112  Resp 17  Level of Consciousness Alert  SpO2 95 %  O2 Device Room Air  Assess: MEWS Score  MEWS Temp 0  MEWS Systolic 0  MEWS Pulse 2  MEWS RR 0  MEWS LOC 0  MEWS Score 2  MEWS Score Color Yellow  Assess: if the MEWS score is Yellow or Red  Were vital signs accurate and taken at a resting state? Yes  Does the patient meet 2 or more of the SIRS criteria? No  MEWS guidelines implemented  Yes, yellow  Treat  MEWS Interventions Considered administering scheduled or prn medications/treatments as ordered  Take Vital Signs  Increase Vital Sign Frequency  Yellow: Q2hr x1, continue Q4hrs until patient remains green for 12hrs  Escalate  MEWS: Escalate Yellow: Discuss with charge nurse and consider notifying provider and/or RRT  Notify: Charge Nurse/RN  Name of Charge Nurse/RN Notified Catrina RN  Provider Notification  Provider Name/Title Dr. Jarvis Newcomer  Date Provider Notified 03/16/23  Time Provider Notified 1033  Method of Notification Page  Notification Reason Other (Comment)  Provider response No new orders  Date of Provider Response 03/16/23  Time of Provider Response 1045  Assess: SIRS CRITERIA  SIRS Temperature  0  SIRS Pulse 1  SIRS Respirations  0  SIRS WBC 0  SIRS Score Sum  1   Will continue to monitor HR, pt is on tele.

## 2023-03-16 NOTE — Plan of Care (Signed)
  Problem: Clinical Measurements: Goal: Will remain free from infection Outcome: Progressing   Problem: Clinical Measurements: Goal: Diagnostic test results will improve Outcome: Progressing   Problem: Safety: Goal: Ability to remain free from injury will improve Outcome: Progressing

## 2023-03-16 NOTE — Progress Notes (Signed)
[facility] PROGRESS NOTE  Stacey [name] (DOB: Jan 02, 1933) ZOX:[id number] PCP: [name], MD  Brief Narrative: Stacey [name] is an 89 y.o. female with medical history significant of A.Fib on coumadin, RA, chronic use of prednisone etiology, history of severe osteoporosis.  Patient presented after mechanical fall at her assisted living facility.  Evaluated in the emergency department.  Found to have pubic ramus fracture.  Was hospitalized for further management due to pain issues and immobility.   Subjective: Pain is stable, no new complaints  Objective: BP 118/78 (BP Location: Right Arm)   Pulse (!) 108   Temp 98.8 F (37.1 C) (Oral)   Resp 18   SpO2 93%   Gen: Frail elderly pale female in no distress Pulm: Clear, nonlabored  CV: Irreg irreg, rate in 100's GI: Soft, NT, ND, +BS Neuro: Alert and oriented. No new focal deficits. Ext: Severe kyphosis Skin: Distal LE brawny hyperpigmentation without other/new rashes, lesions or ulcers on visualized skin   Assessment & Plan: Left pubic ramus fracture. This was discussed with orthopedics.  To be managed conservatively.  No indication for surgical intervention. Pain seems to be getting better.   - Continue pain medications as needed.  Bowel regimen. Patient is on tramadol at home which is being continued. - PT and OT evaluated > SNF disposition will be pursued.   Chronic atrial fibrillation, with RVR:  - Continue home metoprolol, limited by soft BP. Continue telemetry. Control pain. - Continue coumadin per pharmacy.     Hyponatremia Could be due to hypovolemia.  Gentle IV hydration and recheck labs tomorrow.   Long-term systemic steroid use Patient has been on prednisone for 25 years.  She does not know the reason for it though she does mention that she has a history of rheumatoid arthritis.   Chronic diastolic CHF Euvolemic.   Normocytic anemia No evidence of overt bleeding.  Hemoglobin is stable    Compression deformities of T10, T11 and L5 Thought to be chronic.  Incidentally noted on CT scan.   Large hiatal hernia Continue PPI   Bilateral rib fractures Chronic.  Likely due to falls and osteoporosis.  Asymptomatic.  Tyrone Nine, MD [facility] [email or web address] 03/16/2023, 7:48 PM

## 2023-03-16 NOTE — Progress Notes (Signed)
ANTICOAGULATION CONSULT NOTE  Pharmacy Consult for Warfarin Indication: atrial fibrillation  Allergies  Allergen Reactions   Sulfa Antibiotics Anaphylaxis   Tape Other (See Comments)    BRUISES VERY EASILY!!!!   Codeine Nausea And Vomiting   Hydroxychloroquine     Other reaction(s): GI upset   Shellfish-Derived Products Other (See Comments)    Daughter was unsure of the reaction- "happened years ago" and patient avoids foods that contain shellfish   Latex Rash   Neosporin Original [Bacitracin-Neomycin-Polymyxin] Rash   Polysporin [Bacitracin-Polymyxin B] Rash    Patient Measurements:   Vital Signs: Temp: 98.4 F (36.9 C) (08/04 0400) Temp Source: Oral (08/04 0400) BP: 108/77 (08/04 0400) Pulse Rate: 108 (08/04 0400)  Labs: Recent Labs    03/13/23 2101 03/13/23 2351 03/15/23 0045 03/16/23 0425  HGB 10.4*  --  10.6*  --   HCT 31.9*  --  32.7*  --   PLT 173  --  183  --   LABPROT 22.3* 20.8* 22.0* 27.4*  INR 1.9* 1.8* 1.9* 2.5*  CREATININE 0.79  --  0.82 0.66    CrCl cannot be calculated (Unknown ideal weight.).   Medical History: Past Medical History:  Diagnosis Date   Atrial fibrillation (HCC)    Hernia of abdominal cavity    Kyphosis     Assessment: 5 YOF admitted with hip fracture planning non-operative management. On warfarin PTA for atrial fibrillation. Pharmacy consulted to dose while inpatient.   PTA regimen 5mg  Thurs and 2.5mg  all other days  8/2: Spoke with patient and daughter and confirmed that patient took home dose of 5 mg on 8/1, which was not documented by RN. INR remains subtherapeutic at 1.9 last night. CBC stable and no signs of bleeding noted. Given warfarin 5 mg x1.  8/3: Hgb and PLT stable today. No noted concerns for bleeding. INR 1.9 again today. Given warfarin 2.5 mg x1.  8/4: No CBC today. INR 2.5 this morning. No noted concerns for bleeding. Note that patient will have ~9% increased weekly dose this week based on 5 mg dose on  8/1 and 8/2.  Goal of Therapy:  INR 2-3 Monitor platelets by anticoagulation protocol: Yes   Plan:  Continue PTA Warfarin regimen, 2.5 mg x1 today Daily INR Monitor CBC and s/sx of bleeding  Lora Paula, PharmD PGY-2 Infectious Diseases Pharmacy Resident 03/16/2023 8:54 AM

## 2023-03-17 DIAGNOSIS — E43 Unspecified severe protein-calorie malnutrition: Secondary | ICD-10-CM | POA: Insufficient documentation

## 2023-03-17 DIAGNOSIS — S32512A Fracture of superior rim of left pubis, initial encounter for closed fracture: Secondary | ICD-10-CM | POA: Diagnosis not present

## 2023-03-17 MED ORDER — METOPROLOL TARTRATE 50 MG PO TABS
62.5000 mg | ORAL_TABLET | Freq: Two times a day (BID) | ORAL | Status: DC
  Administered 2023-03-17 – 2023-03-18 (×2): 62.5 mg via ORAL
  Filled 2023-03-17 (×2): qty 1

## 2023-03-17 MED ORDER — ENSURE ENLIVE PO LIQD
237.0000 mL | Freq: Two times a day (BID) | ORAL | Status: DC
  Administered 2023-03-17 – 2023-03-18 (×3): 237 mL via ORAL

## 2023-03-17 MED ORDER — WARFARIN SODIUM 2.5 MG PO TABS: 2.5000 mg | ORAL_TABLET | Freq: Once | ORAL | Status: DC

## 2023-03-17 MED ORDER — WARFARIN SODIUM 2 MG PO TABS
2.0000 mg | ORAL_TABLET | Freq: Once | ORAL | Status: AC
  Administered 2023-03-17: 2 mg via ORAL
  Filled 2023-03-17: qty 1

## 2023-03-17 NOTE — TOC Progression Note (Addendum)
Transition of Care [facility]) - Progression Note    Patient Details  Name: Stacey [name] MRN: [id number] Date of Birth: 1932/10/12  Transition of Care [facility]) CM/SW Contact  Lorri Frederick, LCSW Phone Number: 03/17/2023, 12:13 PM  Clinical Narrative:   Bed offers provided to pt on medicare.gov choice document.  Pt asked CSW to call daughter Stacey [name].  CSW spoke with Stacey [name] and also provided offers to her.  They will discuss and make decision.    1300: Message from daughter Stacey [name].  Would like to accept offer at [facility].    Medicare payer--inpatient order on 8/3.  Only 2 midnights.  [facility] pt--CSW sent email to [facility] email and was informed pt is eligible for SNF waiver.   CSW spoke with [facility], informed her of the above, she would prefer to wait until tomorrow for DC to complete the 3 midnight stay, but could potentially admit today.  Would need all paperwork by 2pm.  MD informed.     Expected Discharge Plan: Skilled Nursing Facility Barriers to Discharge: Continued Medical Work up (pain control)  Expected Discharge Plan and Services   Discharge Planning Services: CM Consult   Living arrangements for the past 2 months: Assisted Living Facility [facility])                 DME Arranged: N/A                     Social Determinants of Health (SDOH) Interventions SDOH Screenings   Food Insecurity: No Food Insecurity (03/13/2023)  Housing: Low Risk  (03/13/2023)  Transportation Needs: No Transportation Needs (03/13/2023)  Utilities: Not At Risk (03/13/2023)  Alcohol Screen: Low Risk  (08/30/2020)  Depression (PHQ2-9): Low Risk  (12/11/2022)  Financial Resource Strain: Low Risk  (08/31/2021)  Physical Activity: Inactive (08/31/2021)  Social Connections: Socially Isolated (08/31/2021)  Stress: No Stress Concern Present (08/31/2021)  Tobacco Use: Medium Risk (03/13/2023)    Readmission Risk Interventions     No data to display

## 2023-03-17 NOTE — Discharge Instructions (Signed)

## 2023-03-17 NOTE — Progress Notes (Addendum)
ANTICOAGULATION CONSULT NOTE  Pharmacy Consult for Warfarin Indication: atrial fibrillation  Allergies  Allergen Reactions   Sulfa Antibiotics Anaphylaxis   Tape Other (See Comments)    BRUISES VERY EASILY!!!!   Codeine Nausea And Vomiting   Hydroxychloroquine     Other reaction(s): GI upset   Shellfish-Derived Products Other (See Comments)    Daughter was unsure of the reaction- "happened years ago" and patient avoids foods that contain shellfish   Latex Rash   Neosporin Original [Bacitracin-Neomycin-Polymyxin] Rash   Polysporin [Bacitracin-Polymyxin B] Rash    Patient Measurements:   Vital Signs: Temp: 98.4 F (36.9 C) (08/05 0553) Temp Source: Oral (08/05 0553) BP: 111/85 (08/05 0553) Pulse Rate: 123 (08/05 0553)  Labs: Recent Labs    03/15/23 0045 03/16/23 0425 03/17/23 0009  HGB 10.6*  --   --   HCT 32.7*  --   --   PLT 183  --   --   LABPROT 22.0* 27.4* 30.5*  INR 1.9* 2.5* 2.9*  CREATININE 0.82 0.66  --     CrCl cannot be calculated (Unknown ideal weight.).   Medical History: Past Medical History:  Diagnosis Date   Atrial fibrillation (HCC)    Hernia of abdominal cavity    Kyphosis     Assessment: 68 YOF admitted with hip fracture planning non-operative management. On warfarin PTA for atrial fibrillation. Pharmacy consulted to dose while inpatient.   PTA regimen 5mg  Thurs and 2.5mg  all other days  INR elevated this morning at 2.9, likely from boost given on 8/2 of 5mg . Last CBC stable on 8/3. No DDI noted, no noted bleeding.   Goal of Therapy:  INR 2-3 Monitor platelets by anticoagulation protocol: Yes   Plan:  Given high rate of rise, will give slightly lower dose of 2mg  today.  Daily INR Monitor CBC and s/sx of bleeding  Estill Batten, PharmD, BCCCP  03/17/2023 7:40 AM

## 2023-03-17 NOTE — Progress Notes (Signed)
Occupational Therapy Treatment Patient Details Name: Yamilex Kretschmer MRN: [id number] DOB: 1933-01-02 Today's Date: 03/17/2023   History of present illness The pt is an 89 yo female presenting 8/1 from [place] after a fall in which she hit her head on a dresser. Work up revealed acute L superior pubic rami fx as well as chronic bilateral rib fx and compression deformities of T10, T11, and L5.  PMH includes: vertigo, afib on anticoagulants, kyphosis, RA, CHF.   OT comments  Patient continues to make steady progress towards goals in skilled OT session. Patient's session focused on increasing overall activity tolerance and functional mobility with activites EOB and in standing. Patient had not been out of bed since 8/2 therefore with increasing stiffness, fatigue, and weakness. Patient with need for max A for bed mobility, and max A of 2 to attempt sit<>stands and stand pivot to recliner. Prolonged rest breaks required between bouts of activity. OT recommendation remains appropriate, will continue to follow.    Recommendations for follow up therapy are one component of a multi-disciplinary discharge planning process, led by the attending physician.  Recommendations may be updated based on patient status, additional functional criteria and insurance authorization.    Assistance Recommended at Discharge Frequent or constant Supervision/Assistance  Patient can return home with the following  A lot of help with walking and/or transfers;A lot of help with bathing/dressing/bathroom;Assistance with cooking/housework;Assist for transportation;Help with stairs or ramp for entrance   Equipment Recommendations  Other (comment) (defer to next venue)    Recommendations for Other Services      Precautions / Restrictions Precautions Precautions: Fall Restrictions Weight Bearing Restrictions: Yes LLE Weight Bearing: Weight bearing as tolerated       Mobility Bed Mobility Overal bed mobility: Needs  Assistance Bed Mobility: Supine to Sit     Supine to sit: Max assist     General bed mobility comments: maxA to move LE and elevate trunk, pt unable to scoot without assist    Transfers Overall transfer level: Needs assistance Equipment used: Rolling walker (2 wheels), 2 person hand held assist Transfers: Sit to/from Stand, Bed to chair/wheelchair/BSC Sit to Stand: Max assist, +2 physical assistance     Step pivot transfers: Max assist, +2 physical assistance     General transfer comment: maxA from EOB with cues for RLE positioning, hand placement, and increased time to rise. increased assist for sequential stands. maxA to wt shift and move RW with steps laterally to recliner     Balance Overall balance assessment: Needs assistance Sitting-balance support: Bilateral upper extremity supported, Single extremity supported Sitting balance-Leahy Scale: Poor Sitting balance - Comments: needs assist for forward leaning Postural control: Posterior lean Standing balance support: Bilateral upper extremity supported, During functional activity Standing balance-Leahy Scale: Zero Standing balance comment: dependent on BUE support and modA                           ADL either performed or assessed with clinical judgement   ADL Overall ADL's : Needs assistance/impaired Eating/Feeding: Independent;Sitting   Grooming: Sitting;Min guard                   Toilet Transfer: Stand-pivot;+2 for physical assistance;+2 for safety/equipment;Maximal assistance           Functional mobility during ADLs: Maximal assistance;Cueing for sequencing;Cueing for safety;+2 for safety/equipment;+2 for physical assistance General ADL Comments: Session focus on increasing overall activity tolerance and functional mobility with activites EOB  and in standing. Patient had not been out of bed since 8/2 therefore with increasing stiffness, fatigue, and weakness.    Extremity/Trunk Assessment               Vision       Perception     Praxis      Cognition Arousal/Alertness: Awake/alert Behavior During Therapy: WFL for tasks assessed/performed Overall Cognitive Status: Within Functional Limits for tasks assessed                                 General Comments: pt pleasant and agreeable to session, not formally assesed but likely at baseline        Exercises      Shoulder Instructions       General Comments VSS on RA    Pertinent Vitals/ Pain       Pain Assessment Pain Assessment: Faces Faces Pain Scale: Hurts whole lot Pain Location: LLE with movement, R abdomen to lesser degree Pain Descriptors / Indicators: Aching, Discomfort, Moaning, Grimacing, Guarding  Home Living                                          Prior Functioning/Environment              Frequency  Min 1X/week        Progress Toward Goals  OT Goals(current goals can now be found in the care plan section)  Progress towards OT goals: Progressing toward goals  Acute Rehab OT Goals Patient Stated Goal: to get better OT Goal Formulation: With patient Time For Goal Achievement: 03/28/23 Potential to Achieve Goals: Good  Plan Discharge plan remains appropriate    Co-evaluation      Reason for Co-Treatment: For patient/therapist safety;To address functional/ADL transfers PT goals addressed during session: Mobility/safety with mobility;Balance;Strengthening/ROM        AM-PAC OT "6 Clicks" Daily Activity     Outcome Measure   Help from another person eating meals?: None Help from another person taking care of personal grooming?: A Little Help from another person toileting, which includes using toliet, bedpan, or urinal?: A Lot Help from another person bathing (including washing, rinsing, drying)?: A Lot Help from another person to put on and taking off regular upper body clothing?: None Help from another person to put on and taking off  regular lower body clothing?: A Lot 6 Click Score: 17    End of Session Equipment Utilized During Treatment: Gait belt;Rolling walker (2 wheels)  OT Visit Diagnosis: Unsteadiness on feet (R26.81);Other abnormalities of gait and mobility (R26.89);Pain;Muscle weakness (generalized) (M62.81) Pain - Right/Left: Left Pain - part of body: Leg   Activity Tolerance Patient limited by fatigue;Patient limited by lethargy;Patient limited by pain   Patient Left in chair;with call bell/phone within reach;with chair alarm set   Nurse Communication Mobility status        Time: 7829-5621 OT Time Calculation (min): 31 min  Charges: OT General Charges $OT Visit: 1 Visit OT Treatments $Self Care/Home Management : 8-22 mins  Pollyann Glen E. Yahsir Wickens, OTR/L Acute Rehabilitation Services [phone]   Cherlyn Cushing 03/17/2023, 2:17 PM

## 2023-03-17 NOTE — Progress Notes (Signed)
Physical Therapy Treatment Patient Details Name: Stacey [name] MRN: [id number] DOB: 1933-07-05 Today's Date: 03/17/2023   History of Present Illness The pt is an 89 yo female presenting 8/1 from [place] after a fall in which she hit her head on a dresser. Work up revealed acute L superior pubic rami fx as well as chronic bilateral rib fx and compression deformities of T10, T11, and L5.  PMH includes: vertigo, afib on anticoagulants, kyphosis, RA, CHF.    PT Comments  The pt was agreeable to session, reports she has not gotten out of bed all weekend but is agreeable to mobilize this afternoon. The pt needed maxA to complete bed mobility, was able to assist with RUE, but needed assist to complete all movement and scooting. She also was able to complete multiple sit-stand transfers with use of RW, but is dependent on maxA of 2 to complete and max cues for positioning of RLE, hands, and balance/positioning. The pt required significant assist to manage lateral steps to recliner, continues to need skilled PT to progress LE strength, ROM, balance, and activity tolerance to allow for safer mobility and transfers after d/c.     If plan is discharge home, recommend the following: A lot of help with walking and/or transfers;A lot of help with bathing/dressing/bathroom;Assistance with cooking/housework;Direct supervision/assist for medications management;Direct supervision/assist for financial management;Assist for transportation;Help with stairs or ramp for entrance   Can travel by private vehicle     No  Equipment Recommendations  None recommended by PT (possibly hoyer lift)    Recommendations for Other Services       Precautions / Restrictions Precautions Precautions: Fall Restrictions Weight Bearing Restrictions: Yes LLE Weight Bearing: Weight bearing as tolerated     Mobility  Bed Mobility Overal bed mobility: Needs Assistance Bed Mobility: Supine to Sit     Supine to sit: Max  assist     General bed mobility comments: maxA to move LE and elevate trunk, pt unable to scoot without assist    Transfers Overall transfer level: Needs assistance Equipment used: Rolling walker (2 wheels), 2 person hand held assist Transfers: Sit to/from Stand, Bed to chair/wheelchair/BSC Sit to Stand: Max assist, +2 physical assistance   Step pivot transfers: Max assist, +2 physical assistance       General transfer comment: maxA from EOB with cues for RLE positioning, hand placement, and increased time to rise. increased assist for sequential stands. maxA to wt shift and move RW with steps laterally to recliner    Ambulation/Gait               General Gait Details: limited to lateral steps along EOB and to recliner, maxA to maintain upright, move RW, and facilitate wt shift    Balance Overall balance assessment: Needs assistance Sitting-balance support: Bilateral upper extremity supported, Single extremity supported Sitting balance-Leahy Scale: Poor Sitting balance - Comments: needs assist for forward leaning Postural control: Posterior lean Standing balance support: Bilateral upper extremity supported, During functional activity Standing balance-Leahy Scale: Zero Standing balance comment: dependent on BUE support and modA                            Cognition Arousal/Alertness: Awake/alert Behavior During Therapy: WFL for tasks assessed/performed Overall Cognitive Status: Within Functional Limits for tasks assessed  General Comments: pt pleasant and agreeable to session, not formally assesed but likely at baseline        Exercises      General Comments General comments (skin integrity, edema, etc.): VSS on RA      Pertinent Vitals/Pain Pain Assessment Pain Assessment: Faces Faces Pain Scale: Hurts whole lot Pain Location: LLE with movement, R abdomen to lesser degree Pain Descriptors /  Indicators: Aching, Discomfort, Moaning, Grimacing, Guarding Pain Intervention(s): Limited activity within patient's tolerance, Monitored during session, Premedicated before session, Repositioned     PT Goals (current goals can now be found in the care plan section) Acute Rehab PT Goals Patient Stated Goal: return to daughter's house PT Goal Formulation: With patient/family Time For Goal Achievement: 03/28/23 Potential to Achieve Goals: Good Progress towards PT goals: Progressing toward goals    Frequency    Min 1X/week      PT Plan Current plan remains appropriate    Co-evaluation PT/OT/SLP Co-Evaluation/Treatment: Yes Reason for Co-Treatment: For patient/therapist safety;To address functional/ADL transfers PT goals addressed during session: Mobility/safety with mobility;Balance;Strengthening/ROM        AM-PAC PT "6 Clicks" Mobility   Outcome Measure  Help needed turning from your back to your side while in a flat bed without using bedrails?: A Lot Help needed moving from lying on your back to sitting on the side of a flat bed without using bedrails?: A Lot Help needed moving to and from a bed to a chair (including a wheelchair)?: Total Help needed standing up from a chair using your arms (e.g., wheelchair or bedside chair)?: A Lot Help needed to walk in hospital room?: Total Help needed climbing 3-5 steps with a railing? : Total 6 Click Score: 9    End of Session Equipment Utilized During Treatment: Gait belt Activity Tolerance: Patient tolerated treatment well;Patient limited by pain Patient left: in chair;with call bell/phone within reach;with chair alarm set Nurse Communication: Mobility status PT Visit Diagnosis: Other abnormalities of gait and mobility (R26.89);Muscle weakness (generalized) (M62.81);History of falling (Z91.81);Pain Pain - Right/Left: Left Pain - part of body: Hip;Leg     Time: 3295-1884 PT Time Calculation (min) (ACUTE ONLY): 33  min  Charges:    $Therapeutic Exercise: 8-22 mins PT General Charges $$ ACUTE PT VISIT: 1 Visit                     Vickki Muff, PT, DPT   Acute Rehabilitation Department Office [phone] Secure Chat Communication Preferred   Ronnie Derby 03/17/2023, 1:58 PM

## 2023-03-17 NOTE — Progress Notes (Signed)
Initial Nutrition Assessment  DOCUMENTATION CODES:   Severe malnutrition in context of chronic illness  INTERVENTION:  Continue regular diet as ordered; automatic trays Chopped meats with meals Ensure Enlive po BID, each supplement provides 350 kcal and 20 grams of protein. Magic cup TID with meals, each supplement provides 290 kcal and 9 grams of protein MVI with minerals daily Obtained updated measured weight "High Calorie, High Protein Nutrition Therapy" handout added to AVS  NUTRITION DIAGNOSIS:   Severe Malnutrition related to chronic illness as evidenced by severe muscle depletion, severe fat depletion.  GOAL:   Patient will meet greater than or equal to 90% of their needs  MONITOR:   PO intake, Supplement acceptance, Labs, Weight trends  REASON FOR ASSESSMENT:   Consult Poor PO  ASSESSMENT:   Pt admitted with L superior pubic ramus fracture after a fall at ALF. PMH significant for afib, RA, chronic prednisone use, severe osteoporosis.   Ortho plans for conservative management of fracture.   Spoke with pt at bedside. She reports that she has been living in an ALF for about 2 years. She endorses ongoing decrease in appetite and PO intake. She mentions that she sometimes would prefer to drink some water or a bit of tea. Does not eat in the dining hall of her facility and primarily eats foods from her kitchen in her room.  Overall denies difficulty chewing/swallowing but mentions having difficulty with some tough baked meats. She does not consume nutrition supplements but has received Ensure here and enjoys these. Her home vitamin regimen includes One-a-Day MVI, Vitamin D3 and B vitamins.   Meal completions: 8/3: 50% breakfast 8/4: 50% breakfast  Pt endorses ongoing weight loss which is concerning for her. She reports that she has seen her PCP for this.  Pt reports that her weight was 112 lbs about 2 years ago and was last weighed the beginning of July and was 89  lbs. Unfortunately, there is limited weight history on file within the last year. Pt's noted to have a weight loss of 9.5% between 06/03/22-01/27/23 which is not clinically significant for time frame. Requested updated measured weight.   Medications:Vitamin D3, MVI, protonix, prednisone, warfarin  Labs: sodium 132, BUN 6  NUTRITION - FOCUSED PHYSICAL EXAM:  Flowsheet Row Most Recent Value  Orbital Region Severe depletion  Upper Arm Region Severe depletion  Thoracic and Lumbar Region Severe depletion  Buccal Region Severe depletion  Temple Region Severe depletion  Clavicle Bone Region Severe depletion  Clavicle and Acromion Bone Region Severe depletion  Scapular Bone Region Severe depletion  Dorsal Hand Severe depletion  Patellar Region Severe depletion  Anterior Thigh Region Severe depletion  Posterior Calf Region Moderate depletion  Edema (RD Assessment) None  Hair Reviewed  Eyes Reviewed  Mouth Reviewed  Skin Reviewed  Nails Reviewed       Diet Order:   Diet Order             Diet regular Room service appropriate? Yes; Fluid consistency: Thin  Diet effective now                   EDUCATION NEEDS:   Education needs have been addressed  Skin:  Skin Assessment: Reviewed RN Assessment  Last BM:  8/4  Height:   Ht Readings from Last 1 Encounters:  01/27/23 5\' 3"  (1.6 m)    Weight:   Wt Readings from Last 1 Encounters:  01/27/23 41.2 kg   BMI:  There is no height or  weight on file to calculate BMI.  Estimated Nutritional Needs:   Kcal:  1200-1400  Protein:  60-75g  Fluid:  1.2-1.4L  Drusilla Kanner, RDN, LDN Clinical Nutrition

## 2023-03-17 NOTE — Progress Notes (Signed)
[facility] PROGRESS NOTE  Alyaa Rigdon (DOB: [birth year]) OVF:[id number] PCP: [name], MD  Brief Narrative: Marnee Wilshusen is an 89 y.o. female with medical history significant of A.Fib on coumadin, RA, chronic use of prednisone etiology, history of severe osteoporosis.  Patient presented after mechanical fall at her assisted living facility.  Evaluated in the emergency department.  Found to have pubic ramus fracture.  Was hospitalized for further management due to pain issues and immobility. Pain is reasonably controlled and heart rate control has improved. She is medically stable and will discharge to SNF on 8/6.   Subjective: Eating ok, no new complaints. No pain at the moment. No chest pain, dyspnea, palpitations, lightheadedness.   Objective: BP 112/74 (BP Location: Right Arm)   Pulse (!) 102   Temp 98.5 F (36.9 C) (Oral)   Resp 16   SpO2 93%   Gen: Elderly frail female with severe kyphosis in no distress Pulm: Clear, nonlabored  CV: Irreg irreg with rate in 100's, no MRG or pitting edema GI: Soft, NT, ND, +BS Neuro: Alert and oriented. No new focal deficits. Ext: Warm, no deformities. Skin: No new rashes, lesions or ulcers on visualized skin   Assessment & Plan: Left pubic ramus fracture. This was discussed with orthopedics.  To be managed conservatively.  No indication for surgical intervention. Pain seems to be getting better.   - Continue pain medications as needed.  Bowel regimen. Patient is on tramadol at home which is being continued. - PT and OT evaluated > SNF disposition will be pursued.   Chronic atrial fibrillation, with RVR:  - Continue home metoprolol, limited by soft BP but probably room for slight increase in dose, ordered. Continue telemetry while inpatient. Control pain. - Continue coumadin per pharmacy.     Hyponatremia: This may be chronic. It remains stable and asymptomatic.  - Suggest recheck in the next week.   Long-term  systemic steroid use Patient has been on prednisone for 25 years.  She does not know the reason for it though she does mention that she has a history of rheumatoid arthritis. No recent dose changes, no hypoglycemia.   Chronic diastolic CHF Euvolemic.   Normocytic anemia No evidence of overt bleeding.  Hemoglobin is stable   Compression deformities of T10, T11 and L5 Thought to be chronic.  Incidentally noted on CT scan.   Large hiatal hernia Continue PPI   Bilateral rib fractures Chronic.  Likely due to falls and osteoporosis.  Asymptomatic.  Left elbow pain: Negative XR, reassuring exam.   Tyrone Nine, MD [facility] [email or web address] 03/17/2023, 3:39 PM

## 2023-03-18 DIAGNOSIS — I482 Chronic atrial fibrillation, unspecified: Secondary | ICD-10-CM | POA: Diagnosis not present

## 2023-03-18 DIAGNOSIS — Z9181 History of falling: Secondary | ICD-10-CM | POA: Diagnosis not present

## 2023-03-18 DIAGNOSIS — M069 Rheumatoid arthritis, unspecified: Secondary | ICD-10-CM | POA: Diagnosis not present

## 2023-03-18 DIAGNOSIS — I4891 Unspecified atrial fibrillation: Secondary | ICD-10-CM | POA: Diagnosis not present

## 2023-03-18 DIAGNOSIS — G629 Polyneuropathy, unspecified: Secondary | ICD-10-CM | POA: Diagnosis not present

## 2023-03-18 DIAGNOSIS — I5032 Chronic diastolic (congestive) heart failure: Secondary | ICD-10-CM | POA: Diagnosis not present

## 2023-03-18 DIAGNOSIS — S32512S Fracture of superior rim of left pubis, sequela: Secondary | ICD-10-CM | POA: Diagnosis not present

## 2023-03-18 DIAGNOSIS — S32512D Fracture of superior rim of left pubis, subsequent encounter for fracture with routine healing: Secondary | ICD-10-CM | POA: Diagnosis not present

## 2023-03-18 DIAGNOSIS — M810 Age-related osteoporosis without current pathological fracture: Secondary | ICD-10-CM | POA: Diagnosis not present

## 2023-03-18 DIAGNOSIS — L97514 Non-pressure chronic ulcer of other part of right foot with necrosis of bone: Secondary | ICD-10-CM | POA: Diagnosis not present

## 2023-03-18 DIAGNOSIS — E785 Hyperlipidemia, unspecified: Secondary | ICD-10-CM | POA: Diagnosis not present

## 2023-03-18 DIAGNOSIS — L89890 Pressure ulcer of other site, unstageable: Secondary | ICD-10-CM | POA: Diagnosis not present

## 2023-03-18 DIAGNOSIS — Z7401 Bed confinement status: Secondary | ICD-10-CM | POA: Diagnosis not present

## 2023-03-18 DIAGNOSIS — K219 Gastro-esophageal reflux disease without esophagitis: Secondary | ICD-10-CM | POA: Diagnosis not present

## 2023-03-18 DIAGNOSIS — S32512A Fracture of superior rim of left pubis, initial encounter for closed fracture: Secondary | ICD-10-CM | POA: Diagnosis not present

## 2023-03-18 DIAGNOSIS — R2689 Other abnormalities of gait and mobility: Secondary | ICD-10-CM | POA: Diagnosis not present

## 2023-03-18 DIAGNOSIS — K449 Diaphragmatic hernia without obstruction or gangrene: Secondary | ICD-10-CM | POA: Diagnosis not present

## 2023-03-18 DIAGNOSIS — I959 Hypotension, unspecified: Secondary | ICD-10-CM | POA: Diagnosis not present

## 2023-03-18 DIAGNOSIS — M6281 Muscle weakness (generalized): Secondary | ICD-10-CM | POA: Diagnosis not present

## 2023-03-18 DIAGNOSIS — E43 Unspecified severe protein-calorie malnutrition: Secondary | ICD-10-CM | POA: Diagnosis not present

## 2023-03-18 DIAGNOSIS — Z7952 Long term (current) use of systemic steroids: Secondary | ICD-10-CM | POA: Diagnosis not present

## 2023-03-18 DIAGNOSIS — R41841 Cognitive communication deficit: Secondary | ICD-10-CM | POA: Diagnosis not present

## 2023-03-18 DIAGNOSIS — R531 Weakness: Secondary | ICD-10-CM | POA: Diagnosis not present

## 2023-03-18 MED ORDER — TRAMADOL HCL 50 MG PO TABS
50.0000 mg | ORAL_TABLET | Freq: Two times a day (BID) | ORAL | 0 refills | Status: DC | PRN
Start: 2023-03-18 — End: 2023-04-07

## 2023-03-18 MED ORDER — WARFARIN SODIUM 2.5 MG PO TABS
2.5000 mg | ORAL_TABLET | Freq: Once | ORAL | Status: DC
  Filled 2023-03-18: qty 1

## 2023-03-18 NOTE — Discharge Summary (Signed)
Physician Discharge Summary   Patient: Stacey [name] MRN: [id number] DOB: 01/10/1933  Admit date:     03/13/2023  Discharge date: 03/18/23  Discharge Physician: Tyrone Nine   PCP: [name], MD   Recommendations at discharge:   Follow up BP, HR, continuing metoprolol 50mg  BID, holding spironolactone. May need to increase beta blocker if HR remains borderline elevated after pain subsides.  Continue PT at SNF before going home with daughter.  Stacey [name] is an 89 y.o. female with medical history significant of A.Fib on coumadin, RA, chronic use of prednisone etiology, history of severe osteoporosis.  Patient presented after mechanical fall at her assisted living facility.  Evaluated in the emergency department.  Found to have pubic ramus fracture.  Was hospitalized for further management due to pain issues and immobility. Pain is reasonably controlled and heart rate control has improved. She is medically stable and will discharge to SNF on 8/6.   Discharge Diagnoses: Principal Problem:   Closed fracture of left superior pubic ramus (HCC) Active Problems:   Chronic atrial fibrillation (HCC)   Chronic heart failure with preserved ejection fraction (HFpEF) (HCC)   Long term systemic steroid user   Closed fracture of multiple pubic rami, left, initial encounter (HCC)   Protein-calorie malnutrition, severe  Assessment and Plan: Left pubic ramus fracture. This was discussed with orthopedics.  To be managed conservatively.  No indication for surgical intervention. Pain seems to be getting better.   - Continue pain medications as needed.  Bowel regimen. Patient is on tramadol at home which is being continued. - PT and OT evaluated > SNF disposition will be pursued.   Chronic atrial fibrillation, with RVR:  - Continue home metoprolol, consider dose increase - Continue coumadin   Hyponatremia: This may be chronic. It remains stable and asymptomatic.  - Suggest recheck in  the next week.   Long-term systemic steroid use Patient has been on prednisone for 25 years.  She does not know the reason for it though she does mention that she has a history of rheumatoid arthritis. No recent dose changes, no hypoglycemia.   Chronic diastolic CHF Euvolemic. With soft BP, holding spironolactone 12.5mg .   Normocytic anemia No evidence of overt bleeding.  Hemoglobin is stable   Compression deformities of T10, T11 and L5 Thought to be chronic.  Incidentally noted on CT scan.   Large hiatal hernia Continue PPI   Bilateral rib fractures Chronic.  Likely due to falls and osteoporosis.  Asymptomatic.   Left elbow pain: Negative XR, reassuring exam.   Consultants: None Procedures performed: None  Disposition: SNF Diet recommendation: Regular DISCHARGE MEDICATION: Allergies as of 03/18/2023       Reactions   Sulfa Antibiotics Anaphylaxis   Tape Other (See Comments)   BRUISES VERY EASILY!!!!   Codeine Nausea And Vomiting   Hydroxychloroquine    Other reaction(s): GI upset   Shellfish-derived Products Other (See Comments)   Daughter was unsure of the reaction- "happened years ago" and patient avoids foods that contain shellfish   Latex Rash   Neosporin Original [bacitracin-neomycin-polymyxin] Rash   Polysporin [bacitracin-polymyxin B] Rash        Medication List     STOP taking these medications    spironolactone 25 MG tablet Commonly known as: ALDACTONE       TAKE these medications    acetaminophen 500 MG tablet Commonly known as: TYLENOL Take 1,500 mg by mouth daily. WITH TRAMADOL   atorvastatin 10 MG tablet  Commonly known as: LIPITOR TAKE 1 TABLET DAILY   cyanocobalamin 1000 MCG tablet Take 1,000 mcg by mouth daily.   fexofenadine 60 MG tablet Commonly known as: Allegra Allergy Take 1 tablet (60 mg total) by mouth daily as needed for allergies or rhinitis. What changed: when to take this   gabapentin 400 MG capsule Commonly known as:  NEURONTIN TAKE 1 CAPSULE DAILY   hydrocortisone 1 % ointment Apply 1 Application topically 2 (two) times daily. What changed:  when to take this reasons to take this   meclizine 12.5 MG tablet Commonly known as: ANTIVERT Take 1 tablet (12.5 mg total) by mouth 3 (three) times daily as needed for dizziness.   metoprolol tartrate 50 MG tablet Commonly known as: LOPRESSOR TAKE 1 TABLET TWICE A DAY   multivitamin capsule Take 1 capsule by mouth daily.   omeprazole 20 MG capsule Commonly known as: PRILOSEC Take 20 mg by mouth daily.   potassium chloride SA 20 MEQ tablet Commonly known as: KLOR-CON M TAKE 1 TABLET BY MOUTH DAILY What changed:  how much to take how to take this when to take this additional instructions   predniSONE 5 MG tablet Commonly known as: DELTASONE Take 5 mg by mouth daily.   sertraline 100 MG tablet Commonly known as: ZOLOFT TAKE 1 TABLET DAILY   torsemide 20 MG tablet Commonly known as: DEMADEX TAKE 2 TABLETS DAILY What changed:  how much to take when to take this   traMADol 50 MG tablet Commonly known as: ULTRAM Take 1 tablet (50 mg total) by mouth every 12 (twelve) hours as needed for moderate pain or severe pain. What changed:  how much to take how to take this when to take this reasons to take this additional instructions   Vitamin D (Cholecalciferol) 25 MCG (1000 UT) Tabs Take 1,000 Units by mouth daily.   warfarin 5 MG tablet Commonly known as: COUMADIN Take as directed. If you are unsure how to take this medication, talk to your nurse or doctor. Original instructions: TAKE 1/2 TABLET BY MOUTH DAILY EXCEPT TAKE 1 TABLET ON TUESDAYS, THURSDAYS AND SATURDAYS OR AS DIRECTED BY ANTICOAGULATION CLINIC What changed:  how much to take how to take this when to take this additional instructions        Contact information for follow-up providers     [name], MD Follow up.   Specialty: Family Medicine Contact  information: [address] [place] [address] [phone]              Contact information for after-discharge care     Destination     [facility] .   Service: Skilled Nursing Contact information: [address] [place] [address] [phone]                    Discharge Exam: Ceasar Mons Weights   03/18/23 0429  Weight: 44.6 kg  BP 115/78 (BP Location: Right Arm)   Pulse (!) 127   Temp 98.3 F (36.8 C) (Oral)   Resp 18   Wt 44.6 kg   SpO2 92%   BMI 17.42 kg/m   Elderly pleasant female in no distress No deformities Irreg irreg with trace pedal edema  Condition at discharge: stable  The results of significant diagnostics from this hospitalization (including imaging, microbiology, ancillary and laboratory) are listed below for reference.   Imaging Studies: CT NO CHARGE  Result Date: 03/13/2023 CLINICAL DATA:  Hip pain, stress  fracture suspected. EXAM: CT ADDITIONAL VIEWS AT NO CHARGE TECHNIQUE: Axial, coronal and sagittal osseous reformats of the pelvis were submitted from CT abdomen and pelvis. CONTRAST:  No additional COMPARISON:  CT abdomen and pelvis 03/13/2023 FINDINGS: Osseous: The bones are diffusely osteopenic. There is an acute nondisplaced fracture of the left superior pubic ramus. There is no evidence for dislocation. There also findings suspicious for nondisplaced fracture of the anterior left sacral ala. There is mild asymmetric widening of the left sacroiliac joint anteriorly measuring up to 5 mm (right-sided measures 2 mm). There is a left hip total arthroplasty in anatomic alignment. No evidence for pubic diastasis. There are moderate degenerative changes of the right hip with joint space narrowing and osteophyte formation. There is mild compression deformity of the superior endplate of L5 which is favored as chronic. Soft tissues: No focal soft tissue hematoma identified. Peripheral vascular  calcifications are present. IMPRESSION: 1. Acute nondisplaced fracture of the left superior pubic ramus. 2. Findings suspicious for acute nondisplaced fracture of the anterior left sacral ala. 3. Mild asymmetric widening of the left sacroiliac joint anteriorly. 4. Left hip total arthroplasty in anatomic alignment. 5. Moderate degenerative changes of the right hip. 6. Mild compression deformity of the superior endplate of L5 is favored as chronic. Electronically Signed   By: Darliss Cheney M.D.   On: 03/13/2023 19:03   CT ABDOMEN PELVIS WO CONTRAST  Result Date: 03/13/2023 CLINICAL DATA:  Diarrhea EXAM: CT ABDOMEN AND PELVIS WITHOUT CONTRAST TECHNIQUE: Multidetector CT imaging of the abdomen and pelvis was performed following the standard protocol without IV contrast. RADIATION DOSE REDUCTION: This exam was performed according to the departmental dose-optimization program which includes automated exposure control, adjustment of the mA and/or kV according to patient size and/or use of iterative reconstruction technique. COMPARISON:  MRI lumbar spine 02/20/2021. Lumbar spine x-ray 05/11/2021. FINDINGS: Lower chest: There is atelectasis or scarring in the lung bases. The heart is enlarged. Hepatobiliary: No focal liver abnormality is seen. No gallstones, gallbladder wall thickening, or biliary dilatation. Pancreas: Unremarkable. No pancreatic ductal dilatation or surrounding inflammatory changes. Spleen: Normal in size without focal abnormality. Adrenals/Urinary Tract: Adrenal glands are unremarkable. Kidneys are normal, without renal calculi, focal lesion, or hydronephrosis. Bladder is unremarkable. Stomach/Bowel: There is a large hiatal hernia containing the proximal stomach. The stomach is nondilated. No evidence of bowel wall thickening, distention, or inflammatory changes. The appendix is not seen. There is sigmoid colon diverticulosis. Vascular/Lymphatic: There severe atherosclerotic calcifications of the aorta  and branch vessels. Peripheral vascular calcifications are present. No evidence for abdominal aortic aneurysm. No enlarged lymph nodes are seen. Reproductive: Status post hysterectomy. No adnexal masses. Other: The bones are diffusely osteopenic. Musculoskeletal: The bones are markedly osteopenic. There is levoconvex scoliosis of the lumbar spine with multilevel degenerative change. There are compression deformities of T10 and T11 which are incompletely imaged favored as chronic. There is also mild compression deformity of the superior endplate of L5 which is new from 2022, but favored as chronic. Recommend correlation with point tenderness. Left hip arthroplasty is present. There is an acute nondisplaced fracture of the left superior pubic ramus. IMPRESSION: 1. Acute nondisplaced fracture of the left superior pubic ramus. 2. Compression deformities of T10, T11, and L5 are favored as chronic. Recommend correlation with point tenderness. 3. Large hiatal hernia. 4. Cardiomegaly. 5. Sigmoid colon diverticulosis. Electronically Signed   By: Darliss Cheney M.D.   On: 03/13/2023 18:10   DG Knee Complete 4 Views Left  Result  Date: 03/13/2023 CLINICAL DATA:  Pain after fall EXAM: LEFT KNEE - COMPLETE 4 VIEW COMPARISON:  None Available. FINDINGS: Osteopenia. No fracture or dislocation. There are some joint space loss of the lateral compartment. Small osteophytes. Small osteophytes along the patellofemoral joint. Chondrocalcinosis. Extensive vascular calcifications are seen. No joint effusion on lateral view. IMPRESSION: Severe osteopenia.  Degenerative changes.  Chondrocalcinosis. Electronically Signed   By: Karen Kays M.D.   On: 03/13/2023 14:01   DG Hip Unilat W or Wo Pelvis 2-3 Views Left  Result Date: 03/13/2023 CLINICAL DATA:  Left hip pain after fall. EXAM: DG HIP (WITH OR WITHOUT PELVIS) 2-3V LEFT COMPARISON:  None Available. FINDINGS: Status post left total hip arthroplasty. Diffuse osteopenia is noted. No  definite fracture or dislocation is noted. Vascular calcifications are noted. IMPRESSION: No definite acute abnormality seen. Electronically Signed   By: Lupita Raider M.D.   On: 03/13/2023 13:59   DG Elbow Complete Left  Result Date: 03/13/2023 CLINICAL DATA:  Pain after fall EXAM: LEFT ELBOW - COMPLETE 4 VIEW COMPARISON:  None Available. FINDINGS: Osteopenia. No fracture or dislocation. Preserved joint spaces. No joint effusion seen on lateral view. IV site along the antecubital region. IMPRESSION: Osteopenia.  No acute osseous abnormality. Electronically Signed   By: Karen Kays M.D.   On: 03/13/2023 13:56   CT Head Wo Contrast  Result Date: 03/13/2023 CLINICAL DATA:  Fall, Coumadin EXAM: CT HEAD WITHOUT CONTRAST CT CERVICAL SPINE WITHOUT CONTRAST TECHNIQUE: Multidetector CT imaging of the head and cervical spine was performed following the standard protocol without intravenous contrast. Multiplanar CT image reconstructions of the cervical spine were also generated. RADIATION DOSE REDUCTION: This exam was performed according to the departmental dose-optimization program which includes automated exposure control, adjustment of the mA and/or kV according to patient size and/or use of iterative reconstruction technique. COMPARISON:  None Available. FINDINGS: CT HEAD FINDINGS Brain: No evidence of acute infarction, hemorrhage, hydrocephalus, extra-axial collection or mass lesion/mass effect. Periventricular white matter hypodensity. Vascular: No hyperdense vessel or unexpected calcification. Skull: Normal. Negative for fracture or focal lesion. Sinuses/Orbits: No acute finding. Other: None. CT CERVICAL SPINE FINDINGS Alignment: Normal. Skull base and vertebrae: Osteopenia. No acute fracture. No primary bone lesion or focal pathologic process. Soft tissues and spinal canal: No prevertebral fluid or swelling. No visible canal hematoma. Disc levels: Generally mild multilevel cervical disc space height loss and  osteophytosis. Upper chest: Negative. Other: None. IMPRESSION: 1. No acute intracranial pathology. Small-vessel white matter disease. 2. Osteopenia. No fracture or static subluxation of the cervical spine. 3. Generally mild multilevel cervical disc degenerative disease. Electronically Signed   By: Jearld Lesch M.D.   On: 03/13/2023 13:23   CT Cervical Spine Wo Contrast  Result Date: 03/13/2023 CLINICAL DATA:  Fall, Coumadin EXAM: CT HEAD WITHOUT CONTRAST CT CERVICAL SPINE WITHOUT CONTRAST TECHNIQUE: Multidetector CT imaging of the head and cervical spine was performed following the standard protocol without intravenous contrast. Multiplanar CT image reconstructions of the cervical spine were also generated. RADIATION DOSE REDUCTION: This exam was performed according to the departmental dose-optimization program which includes automated exposure control, adjustment of the mA and/or kV according to patient size and/or use of iterative reconstruction technique. COMPARISON:  None Available. FINDINGS: CT HEAD FINDINGS Brain: No evidence of acute infarction, hemorrhage, hydrocephalus, extra-axial collection or mass lesion/mass effect. Periventricular white matter hypodensity. Vascular: No hyperdense vessel or unexpected calcification. Skull: Normal. Negative for fracture or focal lesion. Sinuses/Orbits: No acute finding. Other: None. CT CERVICAL  SPINE FINDINGS Alignment: Normal. Skull base and vertebrae: Osteopenia. No acute fracture. No primary bone lesion or focal pathologic process. Soft tissues and spinal canal: No prevertebral fluid or swelling. No visible canal hematoma. Disc levels: Generally mild multilevel cervical disc space height loss and osteophytosis. Upper chest: Negative. Other: None. IMPRESSION: 1. No acute intracranial pathology. Small-vessel white matter disease. 2. Osteopenia. No fracture or static subluxation of the cervical spine. 3. Generally mild multilevel cervical disc degenerative disease.  Electronically Signed   By: Jearld Lesch M.D.   On: 03/13/2023 13:23   DG Pelvis Portable  Result Date: 03/13/2023 CLINICAL DATA:  Fall EXAM: PORTABLE PELVIS 1-2 VIEWS COMPARISON:  None Available. FINDINGS: Marked osteopenia. No obvious displaced fracture or dislocation of the pelvis or proximal right femur. Status post left hip total arthroplasty. No obvious perihardware fracture. Nonobstructive pattern of overlying bowel gas. Vascular calcinosis. IMPRESSION: 1. Marked osteopenia. No obvious displaced fracture or dislocation of the pelvis or proximal right femur. 2. Status post left hip total arthroplasty. No obvious perihardware fracture. 3. Plain radiographs are significantly limited in sensitivity for hip and pelvic fracture. Recommend CT if there is clinical suspicion for fracture. Electronically Signed   By: Jearld Lesch M.D.   On: 03/13/2023 13:17   DG Chest Portable 1 View  Result Date: 03/13/2023 CLINICAL DATA:  fall EXAM: PORTABLE CHEST 1 VIEW COMPARISON:  Chest radiograph 12/11/2022 FINDINGS: Asymmetric elevation of the right hemidiaphragm, unchanged from prior exam. Pleural effusion. Pneumothorax. Redemonstrated are multiple subacute to chronic bilateral rib fractures. No definite evidence of an acute displaced rib fracture. Cardiomegaly. Hazy bibasilar airspace opacities could represent atelectasis or infection. Visualized upper abdomen is unremarkable IMPRESSION: 1. Hazy bibasilar airspace opacities could represent atelectasis or infection. 2. Redemonstrated are multiple subacute to chronic bilateral rib fractures. No definite evidence of an acute displaced rib fracture. Electronically Signed   By: Lorenza Cambridge M.D.   On: 03/13/2023 13:11    Microbiology: Results for orders placed or performed during the hospital encounter of 03/13/23  MRSA Next Gen by PCR, Nasal     Status: None   Collection Time: 03/14/23  6:48 AM   Specimen: Nasal Mucosa; Nasal Swab  Result Value Ref Range Status    MRSA by PCR Next Gen NOT DETECTED NOT DETECTED Final    Comment: (NOTE) The GeneXpert MRSA Assay (FDA approved for NASAL specimens only), is one component of a comprehensive MRSA colonization surveillance program. It is not intended to diagnose MRSA infection nor to guide or monitor treatment for MRSA infections. Test performance is not FDA approved in patients less than 3 years old. Performed at [facility], [address], [place], [place] [address]     Labs: CBC: Recent Labs  Lab 03/13/23 2101 03/15/23 0045  WBC 7.4 9.8  NEUTROABS 6.0  --   HGB 10.4* 10.6*  HCT 31.9* 32.7*  MCV 94.1 90.1  PLT 173 183   Basic Metabolic Panel: Recent Labs  Lab 03/13/23 2101 03/15/23 0045 03/16/23 0425  NA 134* 131* 132*  K 3.8 3.5 4.4  CL 95* 94* 98  CO2 29 26 26   GLUCOSE 103* 127* 132*  BUN 10 8 6*  CREATININE 0.79 0.82 0.66  CALCIUM 8.9 8.8* 8.7*  MG 1.9  --  2.1   Liver Function Tests: Recent Labs  Lab 03/13/23 2101  AST 34  ALT 24  ALKPHOS 72  BILITOT 1.3*  PROT 6.0*  ALBUMIN 3.2*   CBG: Recent Labs  Lab  03/14/23 1641  GLUCAP 125*    Discharge time spent: greater than 30 minutes.  Signed: Tyrone Nine, MD [facility] 03/18/2023

## 2023-03-18 NOTE — Progress Notes (Signed)
ANTICOAGULATION CONSULT NOTE  Pharmacy Consult for Warfarin Indication: atrial fibrillation  Allergies  Allergen Reactions   Sulfa Antibiotics Anaphylaxis   Tape Other (See Comments)    BRUISES VERY EASILY!!!!   Codeine Nausea And Vomiting   Hydroxychloroquine     Other reaction(s): GI upset   Shellfish-Derived Products Other (See Comments)    Daughter was unsure of the reaction- "happened years ago" and patient avoids foods that contain shellfish   Latex Rash   Neosporin Original [Bacitracin-Neomycin-Polymyxin] Rash   Polysporin [Bacitracin-Polymyxin B] Rash    Patient Measurements: Weight: 44.6 kg (98 lb 5.2 oz) Vital Signs: Temp: 98.2 F (36.8 C) (08/06 0429) Temp Source: Oral (08/06 0429) BP: 140/97 (08/06 0429) Pulse Rate: 127 (08/06 0008)  Labs: Recent Labs    03/16/23 0425 03/17/23 0009 03/17/23 2335  LABPROT 27.4* 30.5* 24.6*  INR 2.5* 2.9* 2.2*  CREATININE 0.66  --   --     Estimated Creatinine Clearance: 34.2 mL/min (by C-G formula based on SCr of 0.66 mg/dL).   Medical History: Past Medical History:  Diagnosis Date   Atrial fibrillation (HCC)    Hernia of abdominal cavity    Kyphosis     Assessment: 80 YOF admitted with hip fracture planning non-operative management. On warfarin PTA for atrial fibrillation. Pharmacy consulted to dose while inpatient.   PTA regimen 5mg  Thurs and 2.5mg  all other days  INR this morning therapeutic at 2.2. No noted s/sx of bleeding.   Goal of Therapy:  INR 2-3 Monitor platelets by anticoagulation protocol: Yes   Plan:  Will give PTA dose of warfarin 2.5mg .  Daily INR Monitor CBC and s/sx of bleeding  Estill Batten, PharmD, BCCCP  03/18/2023 7:14 AM

## 2023-03-18 NOTE — Progress Notes (Signed)
Report called to [facility] [phone] spoke with Nurse bernice who will be taking care of pt there once she arrives. Nurse verbalized understanding of report and had no further questions. Pt currently in room waiting for PTAR pick up

## 2023-03-18 NOTE — Progress Notes (Signed)
   03/18/23 1249  Assess: MEWS Score  Temp 98.2 F (36.8 C)  BP 102/72  MAP (mmHg) 80  Pulse Rate (!) 129  Resp 19  Level of Consciousness Alert  SpO2 94 %  O2 Device Room Air  Assess: MEWS Score  MEWS Temp 0  MEWS Systolic 0  MEWS Pulse 2  MEWS RR 0  MEWS LOC 0  MEWS Score 2  MEWS Score Color Yellow  Assess: if the MEWS score is Yellow or Red  Were vital signs accurate and taken at a resting state? Yes  Does the patient meet 2 or more of the SIRS criteria? No  MEWS guidelines implemented  Yes, yellow  Treat  MEWS Interventions Considered administering scheduled or prn medications/treatments as ordered  Take Vital Signs  Increase Vital Sign Frequency  Yellow: Q2hr x1, continue Q4hrs until patient remains green for 12hrs  Escalate  MEWS: Escalate Yellow: Discuss with charge nurse and consider notifying provider and/or RRT  Notify: Charge Nurse/RN  Name of Charge Nurse/RN Notified Zee,RN  Provider Notification  Provider Name/Title Grunz,MD  Date Provider Notified 03/18/23  Time Provider Notified 1257  Method of Notification Page  Notification Reason Other (Comment) (Yellow MEWS)  Provider response No new orders  Date of Provider Response 03/18/23  Time of Provider Response 1258  Assess: SIRS CRITERIA  SIRS Temperature  0  SIRS Pulse 1  SIRS Respirations  0  SIRS WBC 0  SIRS Score Sum  1

## 2023-03-18 NOTE — Progress Notes (Signed)
   03/18/23 0747  Assess: MEWS Score  Temp 98.3 F (36.8 C)  BP 115/78  MAP (mmHg) 90  Resp 18  Level of Consciousness Alert  SpO2 92 %  O2 Device Room Air  Assess: MEWS Score  MEWS Temp 0  MEWS Systolic 0  MEWS Pulse 2  MEWS RR 0  MEWS LOC 0  MEWS Score 2  MEWS Score Color Yellow  Assess: if the MEWS score is Yellow or Red  Were vital signs accurate and taken at a resting state? Yes  Does the patient meet 2 or more of the SIRS criteria? No  MEWS guidelines implemented  Yes, yellow  Treat  MEWS Interventions Considered administering scheduled or prn medications/treatments as ordered  Take Vital Signs  Increase Vital Sign Frequency  Yellow: Q2hr x1, continue Q4hrs until patient remains green for 12hrs  Escalate  MEWS: Escalate Yellow: Discuss with charge nurse and consider notifying provider and/or RRT  Notify: Charge Nurse/RN  Name of Charge Nurse/RN Notified Zee,RN  Provider Notification  Provider Name/Title Grunz,MD  Date Provider Notified 03/18/23  Time Provider Notified [phone]  Method of Notification Page  Notification Reason Other (Comment) (Yellow MEWS)  Provider response Other (Comment) (give metoprolol)  Date of Provider Response 03/18/23  Time of Provider Response [phone]  Assess: SIRS CRITERIA  SIRS Temperature  0  SIRS Pulse 1  SIRS Respirations  0  SIRS WBC 0  SIRS Score Sum  1

## 2023-03-18 NOTE — TOC Transition Note (Signed)
Transition of Care [facility]) - CM/SW Discharge Note   Patient Details  Name: Stacey [name] MRN: [id number] Date of Birth: 02-Apr-1933  Transition of Care [facility]) CM/SW Contact:  Lorri Frederick, LCSW Phone Number: 03/18/2023, 12:45 PM   Clinical Narrative:   Pt discharging to [facility].  RN call report to [phone].      0845: CSW confirmed with Andrea/[facility] that they can receive pt today.     Final next level of care: Skilled Nursing Facility Barriers to Discharge: Barriers Resolved   Patient Goals and CMS Choice      Discharge Placement                Patient chooses bed at:  [facility]) Patient to be transferred to facility by: [facility] Name of family member notified: daughter [name] Patient and family notified of of transfer: 03/18/23  Discharge Plan and Services Additional resources added to the After Visit Summary for     Discharge Planning Services: CM Consult            DME Arranged: N/A                    Social Determinants of Health (SDOH) Interventions SDOH Screenings   Food Insecurity: No Food Insecurity (03/13/2023)  Housing: Low Risk  (03/13/2023)  Transportation Needs: No Transportation Needs (03/13/2023)  Utilities: Not At Risk (03/13/2023)  Alcohol Screen: Low Risk  (08/30/2020)  Depression (PHQ2-9): Low Risk  (12/11/2022)  Financial Resource Strain: Low Risk  (08/31/2021)  Physical Activity: Inactive (08/31/2021)  Social Connections: Socially Isolated (08/31/2021)  Stress: No Stress Concern Present (08/31/2021)  Tobacco Use: Medium Risk (03/13/2023)     Readmission Risk Interventions     No data to display

## 2023-03-19 ENCOUNTER — Encounter: Payer: Self-pay | Admitting: Adult Health

## 2023-03-19 ENCOUNTER — Non-Acute Institutional Stay: Payer: Self-pay | Admitting: Adult Health

## 2023-03-19 DIAGNOSIS — E785 Hyperlipidemia, unspecified: Secondary | ICD-10-CM

## 2023-03-19 DIAGNOSIS — S32512S Fracture of superior rim of left pubis, sequela: Secondary | ICD-10-CM

## 2023-03-19 DIAGNOSIS — M069 Rheumatoid arthritis, unspecified: Secondary | ICD-10-CM | POA: Diagnosis not present

## 2023-03-19 DIAGNOSIS — K219 Gastro-esophageal reflux disease without esophagitis: Secondary | ICD-10-CM

## 2023-03-19 DIAGNOSIS — I5032 Chronic diastolic (congestive) heart failure: Secondary | ICD-10-CM | POA: Diagnosis not present

## 2023-03-19 DIAGNOSIS — G629 Polyneuropathy, unspecified: Secondary | ICD-10-CM

## 2023-03-19 DIAGNOSIS — I482 Chronic atrial fibrillation, unspecified: Secondary | ICD-10-CM | POA: Diagnosis not present

## 2023-03-19 MED ORDER — TRAMADOL HCL 50 MG PO TABS
50.0000 mg | ORAL_TABLET | Freq: Two times a day (BID) | ORAL | Status: DC
Start: 2023-03-19 — End: 2023-03-20

## 2023-03-19 NOTE — Progress Notes (Addendum)
Location:  Other [facility].  Nursing Home Room Number: [facility] 116A Place of Service:  SNF [phone]) Provider:  Kenard Gower, DNP, FNP-BC  Patient Care Team: [name], MD as PCP - General (Family Medicine) Maisie Fus, MD as PCP - Cardiology (Cardiology)  Extended Emergency Contact Information Primary Emergency Contact: Cornish,Janice Address: [address]          [facility] [place], [place] [address] [place] Home Phone: [phone] Mobile Phone: [phone] Relation: Daughter Preferred language: English Interpreter needed? No  Code Status:  Full Code.   Goals of care: Advanced Directive information    03/19/2023   10:07 AM  Advanced Directives  Does Patient Have a Medical Advance Directive? No  Would patient like information on creating a medical advance directive? No - Patient declined     Chief Complaint  Patient presents with   Hospitalization Follow-up    Hospital Follow up    HPI:  Pt is a 89 y.o. female seen today for hospital follow up. She was hospitalized 03/13/23 to 03/18/23 S/P fall at [facility] ALF sustaining left superior pubic ramus fracture.. Orthopedic was consulted and recommended conservative management. She has a PMH of atrial fibrillation on Coumadin, RA with chronic use of Prednisone and severe osteoporosis.  She was admitted to [facility] on 03/18/23 for short-term rehabilitation. She was seen in her room today. She stated that she plans to live with her daughter when she gets discharged.   Past Medical History:  Diagnosis Date   Atrial fibrillation (HCC)    Hernia of abdominal cavity    Kyphosis    Past Surgical History:  Procedure Laterality Date   ABDOMINAL HYSTERECTOMY     CATARACT EXTRACTION, BILATERAL     TONSILLECTOMY      Allergies  Allergen Reactions   Sulfa Antibiotics Anaphylaxis   Tape Other (See Comments)    BRUISES VERY EASILY!!!!   Codeine Nausea And Vomiting   Hydroxychloroquine      Other reaction(s): GI upset   Shellfish-Derived Products Other (See Comments)    Daughter was unsure of the reaction- "happened years ago" and patient avoids foods that contain shellfish   Latex Rash   Neosporin Original [Bacitracin-Neomycin-Polymyxin] Rash   Polysporin [Bacitracin-Polymyxin B] Rash    Outpatient Encounter Medications as of 03/19/2023  Medication Sig   acetaminophen (TYLENOL) 500 MG tablet Take 500 mg by mouth 3 (three) times daily.   atorvastatin (LIPITOR) 10 MG tablet TAKE 1 TABLET DAILY   cyanocobalamin 1000 MCG tablet Take 1,000 mcg by mouth daily.   fexofenadine (ALLEGRA ALLERGY) 60 MG tablet Take 1 tablet (60 mg total) by mouth daily as needed for allergies or rhinitis.   gabapentin (NEURONTIN) 400 MG capsule TAKE 1 CAPSULE DAILY   hydrocortisone 1 % ointment Apply 1 Application topically 2 (two) times daily.   meclizine (ANTIVERT) 12.5 MG tablet Take 1 tablet (12.5 mg total) by mouth 3 (three) times daily as needed for dizziness.   metoprolol tartrate (LOPRESSOR) 50 MG tablet TAKE 1 TABLET TWICE A DAY   Multiple Vitamin (MULTIVITAMIN) capsule Take 1 capsule by mouth daily.   omeprazole (PRILOSEC) 20 MG capsule Take 20 mg by mouth daily.   potassium chloride SA (KLOR-CON M) 20 MEQ tablet TAKE 1 TABLET BY MOUTH DAILY   predniSONE (DELTASONE) 5 MG tablet Take 5 mg by mouth daily.   sertraline (ZOLOFT) 100 MG tablet TAKE 1 TABLET DAILY   torsemide (DEMADEX) 20 MG tablet  Take 40 mg by mouth daily.   traMADol (ULTRAM) 50 MG tablet Take 1 tablet (50 mg total) by mouth every 12 (twelve) hours as needed for moderate pain or severe pain.   Vitamin D, Cholecalciferol, 25 MCG (1000 UT) TABS Take 1,000 Units by mouth daily.   warfarin (COUMADIN) 5 MG tablet Take 2.5 mg by mouth daily. Every Tuesday, Thursday and Saturday. Take ONE tablet by mouth once daily on Monday, Wednesday Friday and Sunday.   Zinc Oxide (TRIPLE PASTE) 12.8 % ointment Apply 1 Application topically. To  buttock every shift.   [DISCONTINUED] acetaminophen (TYLENOL) 500 MG tablet Take 1,500 mg by mouth daily. WITH TRAMADOL   [DISCONTINUED] torsemide (DEMADEX) 20 MG tablet TAKE 2 TABLETS DAILY (Patient taking differently: Take 20 mg by mouth daily.)   [DISCONTINUED] warfarin (COUMADIN) 5 MG tablet TAKE 1/2 TABLET BY MOUTH DAILY EXCEPT TAKE 1 TABLET ON TUESDAYS, THURSDAYS AND SATURDAYS OR AS DIRECTED BY ANTICOAGULATION CLINIC (Patient taking differently: Take 2.5-5 mg by mouth See admin instructions. Take 5 mg on Thursdays and 2.5 mg on all other days.)   No facility-administered encounter medications on file as of 03/19/2023.    Review of Systems  Constitutional:  Negative for appetite change, chills, fatigue and fever.  HENT:  Negative for congestion, hearing loss, rhinorrhea and sore throat.   Eyes: Negative.   Respiratory:  Negative for cough, shortness of breath and wheezing.   Cardiovascular:  Negative for chest pain, palpitations and leg swelling.  Gastrointestinal:  Negative for abdominal pain, constipation, diarrhea, nausea and vomiting.  Genitourinary:  Negative for dysuria.  Musculoskeletal:  Positive for arthralgias. Negative for back pain and myalgias.  Skin:  Negative for color change, rash and wound.  Neurological:  Negative for dizziness, weakness and headaches.  Psychiatric/Behavioral:  Negative for behavioral problems. The patient is not nervous/anxious.       Immunization History  Administered Date(s) Administered   Pneumococcal Polysaccharide-23 08/30/2020   Pertinent  Health Maintenance Due  Topic Date Due   INFLUENZA VACCINE  03/13/2023   DEXA SCAN  09/04/2023 (Originally 01/05/2000)      12 /12/2020   12:23 PM 08/31/2021    2:46 PM 04/25/2022    4:59 PM 09/03/2022    3:38 PM 12/11/2022    5:03 PM  Fall Risk  Falls in the past year?  1 1 1 1   Was there an injury with Fall?  1 1 1 1   Was there an injury with Fall? - Comments  Patient fell injured back. Followed by  Urgent care     Fall Risk Category Calculator  3 2 3 3   Fall Risk Category (Retired)  High Moderate    (RETIRED) Patient Fall Risk Level Low fall risk Moderate fall risk Moderate fall risk    Patient at Risk for Falls Due to   Other (Comment) Other (Comment) Other (Comment)  Fall risk Follow up   Falls evaluation completed Falls evaluation completed Falls evaluation completed     Vitals:   03/19/23 0957  BP: 124/76  Pulse: 65  Resp: 14  Temp: (!) 97.5 F (36.4 C)  SpO2: 90%  Weight: 90 lb 8 oz (41.1 kg)  Height: 5\' 3"  (1.6 m)   Body mass index is 16.03 kg/m.  Physical Exam Constitutional:      Appearance: Normal appearance.  HENT:     Head: Normocephalic and atraumatic.     Nose: Nose normal.     Mouth/Throat:     Mouth: Mucous membranes  are moist.  Eyes:     Conjunctiva/sclera: Conjunctivae normal.  Cardiovascular:     Rate and Rhythm: Normal rate and regular rhythm.  Pulmonary:     Effort: Pulmonary effort is normal.     Breath sounds: Normal breath sounds.  Abdominal:     General: Bowel sounds are normal.     Palpations: Abdomen is soft.  Musculoskeletal:     Cervical back: Normal range of motion.     Comments: Bilateral hand fingers with arthritic deformities  Skin:    General: Skin is warm and dry.  Neurological:     General: No focal deficit present.     Mental Status: She is alert and oriented to person, place, and time.  Psychiatric:        Mood and Affect: Mood normal.        Behavior: Behavior normal.        Thought Content: Thought content normal.        Judgment: Judgment normal.      Labs reviewed: Recent Labs    03/13/23 2101 03/15/23 0045 03/16/23 0425  NA 134* 131* 132*  K 3.8 3.5 4.4  CL 95* 94* 98  CO2 29 26 26   GLUCOSE 103* 127* 132*  BUN 10 8 6*  CREATININE 0.79 0.82 0.66  CALCIUM 8.9 8.8* 8.7*  MG 1.9  --  2.1   Recent Labs    03/13/23 2101  AST 34  ALT 24  ALKPHOS 72  BILITOT 1.3*  PROT 6.0*  ALBUMIN 3.2*    Recent Labs    03/13/23 2101 03/15/23 0045  WBC 7.4 9.8  NEUTROABS 6.0  --   HGB 10.4* 10.6*  HCT 31.9* 32.7*  MCV 94.1 90.1  PLT 173 183   No results found for: "TSH" No results found for: "HGBA1C" No results found for: "CHOL", "HDL", "LDLCALC", "LDLDIRECT", "TRIG", "CHOLHDL"  Significant Diagnostic Results in last 30 days:  CT NO CHARGE  Result Date: 03/13/2023 CLINICAL DATA:  Hip pain, stress fracture suspected. EXAM: CT ADDITIONAL VIEWS AT NO CHARGE TECHNIQUE: Axial, coronal and sagittal osseous reformats of the pelvis were submitted from CT abdomen and pelvis. CONTRAST:  No additional COMPARISON:  CT abdomen and pelvis 03/13/2023 FINDINGS: Osseous: The bones are diffusely osteopenic. There is an acute nondisplaced fracture of the left superior pubic ramus. There is no evidence for dislocation. There also findings suspicious for nondisplaced fracture of the anterior left sacral ala. There is mild asymmetric widening of the left sacroiliac joint anteriorly measuring up to 5 mm (right-sided measures 2 mm). There is a left hip total arthroplasty in anatomic alignment. No evidence for pubic diastasis. There are moderate degenerative changes of the right hip with joint space narrowing and osteophyte formation. There is mild compression deformity of the superior endplate of L5 which is favored as chronic. Soft tissues: No focal soft tissue hematoma identified. Peripheral vascular calcifications are present. IMPRESSION: 1. Acute nondisplaced fracture of the left superior pubic ramus. 2. Findings suspicious for acute nondisplaced fracture of the anterior left sacral ala. 3. Mild asymmetric widening of the left sacroiliac joint anteriorly. 4. Left hip total arthroplasty in anatomic alignment. 5. Moderate degenerative changes of the right hip. 6. Mild compression deformity of the superior endplate of L5 is favored as chronic. Electronically Signed   By: Darliss Cheney M.D.   On: 03/13/2023 19:03   CT  ABDOMEN PELVIS WO CONTRAST  Result Date: 03/13/2023 CLINICAL DATA:  Diarrhea EXAM: CT ABDOMEN AND PELVIS WITHOUT  CONTRAST TECHNIQUE: Multidetector CT imaging of the abdomen and pelvis was performed following the standard protocol without IV contrast. RADIATION DOSE REDUCTION: This exam was performed according to the departmental dose-optimization program which includes automated exposure control, adjustment of the mA and/or kV according to patient size and/or use of iterative reconstruction technique. COMPARISON:  MRI lumbar spine 02/20/2021. Lumbar spine x-ray 05/11/2021. FINDINGS: Lower chest: There is atelectasis or scarring in the lung bases. The heart is enlarged. Hepatobiliary: No focal liver abnormality is seen. No gallstones, gallbladder wall thickening, or biliary dilatation. Pancreas: Unremarkable. No pancreatic ductal dilatation or surrounding inflammatory changes. Spleen: Normal in size without focal abnormality. Adrenals/Urinary Tract: Adrenal glands are unremarkable. Kidneys are normal, without renal calculi, focal lesion, or hydronephrosis. Bladder is unremarkable. Stomach/Bowel: There is a large hiatal hernia containing the proximal stomach. The stomach is nondilated. No evidence of bowel wall thickening, distention, or inflammatory changes. The appendix is not seen. There is sigmoid colon diverticulosis. Vascular/Lymphatic: There severe atherosclerotic calcifications of the aorta and branch vessels. Peripheral vascular calcifications are present. No evidence for abdominal aortic aneurysm. No enlarged lymph nodes are seen. Reproductive: Status post hysterectomy. No adnexal masses. Other: The bones are diffusely osteopenic. Musculoskeletal: The bones are markedly osteopenic. There is levoconvex scoliosis of the lumbar spine with multilevel degenerative change. There are compression deformities of T10 and T11 which are incompletely imaged favored as chronic. There is also mild compression deformity of  the superior endplate of L5 which is new from 2022, but favored as chronic. Recommend correlation with point tenderness. Left hip arthroplasty is present. There is an acute nondisplaced fracture of the left superior pubic ramus. IMPRESSION: 1. Acute nondisplaced fracture of the left superior pubic ramus. 2. Compression deformities of T10, T11, and L5 are favored as chronic. Recommend correlation with point tenderness. 3. Large hiatal hernia. 4. Cardiomegaly. 5. Sigmoid colon diverticulosis. Electronically Signed   By: Darliss Cheney M.D.   On: 03/13/2023 18:10   DG Knee Complete 4 Views Left  Result Date: 03/13/2023 CLINICAL DATA:  Pain after fall EXAM: LEFT KNEE - COMPLETE 4 VIEW COMPARISON:  None Available. FINDINGS: Osteopenia. No fracture or dislocation. There are some joint space loss of the lateral compartment. Small osteophytes. Small osteophytes along the patellofemoral joint. Chondrocalcinosis. Extensive vascular calcifications are seen. No joint effusion on lateral view. IMPRESSION: Severe osteopenia.  Degenerative changes.  Chondrocalcinosis. Electronically Signed   By: Karen Kays M.D.   On: 03/13/2023 14:01   DG Hip Unilat W or Wo Pelvis 2-3 Views Left  Result Date: 03/13/2023 CLINICAL DATA:  Left hip pain after fall. EXAM: DG HIP (WITH OR WITHOUT PELVIS) 2-3V LEFT COMPARISON:  None Available. FINDINGS: Status post left total hip arthroplasty. Diffuse osteopenia is noted. No definite fracture or dislocation is noted. Vascular calcifications are noted. IMPRESSION: No definite acute abnormality seen. Electronically Signed   By: Lupita Raider M.D.   On: 03/13/2023 13:59   DG Elbow Complete Left  Result Date: 03/13/2023 CLINICAL DATA:  Pain after fall EXAM: LEFT ELBOW - COMPLETE 4 VIEW COMPARISON:  None Available. FINDINGS: Osteopenia. No fracture or dislocation. Preserved joint spaces. No joint effusion seen on lateral view. IV site along the antecubital region. IMPRESSION: Osteopenia.  No acute  osseous abnormality. Electronically Signed   By: Karen Kays M.D.   On: 03/13/2023 13:56   CT Head Wo Contrast  Result Date: 03/13/2023 CLINICAL DATA:  Fall, Coumadin EXAM: CT HEAD WITHOUT CONTRAST CT CERVICAL SPINE WITHOUT CONTRAST TECHNIQUE: Multidetector  CT imaging of the head and cervical spine was performed following the standard protocol without intravenous contrast. Multiplanar CT image reconstructions of the cervical spine were also generated. RADIATION DOSE REDUCTION: This exam was performed according to the departmental dose-optimization program which includes automated exposure control, adjustment of the mA and/or kV according to patient size and/or use of iterative reconstruction technique. COMPARISON:  None Available. FINDINGS: CT HEAD FINDINGS Brain: No evidence of acute infarction, hemorrhage, hydrocephalus, extra-axial collection or mass lesion/mass effect. Periventricular white matter hypodensity. Vascular: No hyperdense vessel or unexpected calcification. Skull: Normal. Negative for fracture or focal lesion. Sinuses/Orbits: No acute finding. Other: None. CT CERVICAL SPINE FINDINGS Alignment: Normal. Skull base and vertebrae: Osteopenia. No acute fracture. No primary bone lesion or focal pathologic process. Soft tissues and spinal canal: No prevertebral fluid or swelling. No visible canal hematoma. Disc levels: Generally mild multilevel cervical disc space height loss and osteophytosis. Upper chest: Negative. Other: None. IMPRESSION: 1. No acute intracranial pathology. Small-vessel white matter disease. 2. Osteopenia. No fracture or static subluxation of the cervical spine. 3. Generally mild multilevel cervical disc degenerative disease. Electronically Signed   By: Jearld Lesch M.D.   On: 03/13/2023 13:23   CT Cervical Spine Wo Contrast  Result Date: 03/13/2023 CLINICAL DATA:  Fall, Coumadin EXAM: CT HEAD WITHOUT CONTRAST CT CERVICAL SPINE WITHOUT CONTRAST TECHNIQUE: Multidetector CT imaging  of the head and cervical spine was performed following the standard protocol without intravenous contrast. Multiplanar CT image reconstructions of the cervical spine were also generated. RADIATION DOSE REDUCTION: This exam was performed according to the departmental dose-optimization program which includes automated exposure control, adjustment of the mA and/or kV according to patient size and/or use of iterative reconstruction technique. COMPARISON:  None Available. FINDINGS: CT HEAD FINDINGS Brain: No evidence of acute infarction, hemorrhage, hydrocephalus, extra-axial collection or mass lesion/mass effect. Periventricular white matter hypodensity. Vascular: No hyperdense vessel or unexpected calcification. Skull: Normal. Negative for fracture or focal lesion. Sinuses/Orbits: No acute finding. Other: None. CT CERVICAL SPINE FINDINGS Alignment: Normal. Skull base and vertebrae: Osteopenia. No acute fracture. No primary bone lesion or focal pathologic process. Soft tissues and spinal canal: No prevertebral fluid or swelling. No visible canal hematoma. Disc levels: Generally mild multilevel cervical disc space height loss and osteophytosis. Upper chest: Negative. Other: None. IMPRESSION: 1. No acute intracranial pathology. Small-vessel white matter disease. 2. Osteopenia. No fracture or static subluxation of the cervical spine. 3. Generally mild multilevel cervical disc degenerative disease. Electronically Signed   By: Jearld Lesch M.D.   On: 03/13/2023 13:23   DG Pelvis Portable  Result Date: 03/13/2023 CLINICAL DATA:  Fall EXAM: PORTABLE PELVIS 1-2 VIEWS COMPARISON:  None Available. FINDINGS: Marked osteopenia. No obvious displaced fracture or dislocation of the pelvis or proximal right femur. Status post left hip total arthroplasty. No obvious perihardware fracture. Nonobstructive pattern of overlying bowel gas. Vascular calcinosis. IMPRESSION: 1. Marked osteopenia. No obvious displaced fracture or dislocation  of the pelvis or proximal right femur. 2. Status post left hip total arthroplasty. No obvious perihardware fracture. 3. Plain radiographs are significantly limited in sensitivity for hip and pelvic fracture. Recommend CT if there is clinical suspicion for fracture. Electronically Signed   By: Jearld Lesch M.D.   On: 03/13/2023 13:17   DG Chest Portable 1 View  Result Date: 03/13/2023 CLINICAL DATA:  fall EXAM: PORTABLE CHEST 1 VIEW COMPARISON:  Chest radiograph 12/11/2022 FINDINGS: Asymmetric elevation of the right hemidiaphragm, unchanged from prior exam. Pleural effusion. Pneumothorax. Redemonstrated  are multiple subacute to chronic bilateral rib fractures. No definite evidence of an acute displaced rib fracture. Cardiomegaly. Hazy bibasilar airspace opacities could represent atelectasis or infection. Visualized upper abdomen is unremarkable IMPRESSION: 1. Hazy bibasilar airspace opacities could represent atelectasis or infection. 2. Redemonstrated are multiple subacute to chronic bilateral rib fractures. No definite evidence of an acute displaced rib fracture. Electronically Signed   By: Lorenza Cambridge M.D.   On: 03/13/2023 13:11    Assessment/Plan  1. Closed fracture of superior ramus of left pubis, sequela -  orthopedic consulted and recommended conservative management - start traMADol (ULTRAM) 50 MG tablet; Take 1 tablet (50 mg total) by mouth 2 (two) times daily. -  continue Tramadol 50 mg Q 12 hours PRN -  for PT and OT for therapeutic strengthening exercises  2. Chronic heart failure with preserved ejection fraction (HFpEF) (HCC) -  no SOB -  continue Torsemide and Klor Con  3. Rheumatoid arthritis involving multiple sites, unspecified whether rheumatoid factor present (HCC) -  continue Prednisone  4. Chronic atrial fibrillation (HCC) -  rate-controlled -  continue Metoprolol tartrate and Coumadin  5. Gastroesophageal reflux disease without esophagitis -  continue Omeprazole  6.  Neuropathy -  stated that bilateral legs has occasional burning sensation -  continue Gabapentin  7. Hyperlipidemia, unspecified hyperlipidemia type -  continue Atorvastatin     Family/ staff Communication: Discussed plan of care with resident and charge nurse.  Labs/tests ordered: None    Kenard Gower, DNP, MSN, FNP-BC [facility] [phone] (Monday-Friday 8:00 a.m. - 5:00 p.m.) [phone] (after hours)

## 2023-03-20 ENCOUNTER — Other Ambulatory Visit: Payer: Self-pay | Admitting: Nurse Practitioner

## 2023-03-20 DIAGNOSIS — S32512S Fracture of superior rim of left pubis, sequela: Secondary | ICD-10-CM

## 2023-03-20 DIAGNOSIS — I4891 Unspecified atrial fibrillation: Secondary | ICD-10-CM | POA: Diagnosis not present

## 2023-03-20 MED ORDER — TRAMADOL HCL 50 MG PO TABS
50.0000 mg | ORAL_TABLET | Freq: Two times a day (BID) | ORAL | 0 refills | Status: DC
Start: 2023-03-20 — End: 2023-04-07

## 2023-03-21 ENCOUNTER — Non-Acute Institutional Stay: Payer: Self-pay | Admitting: Adult Health

## 2023-03-21 ENCOUNTER — Encounter: Payer: Self-pay | Admitting: Adult Health

## 2023-03-21 DIAGNOSIS — I482 Chronic atrial fibrillation, unspecified: Secondary | ICD-10-CM | POA: Diagnosis not present

## 2023-03-21 DIAGNOSIS — I5032 Chronic diastolic (congestive) heart failure: Secondary | ICD-10-CM

## 2023-03-21 DIAGNOSIS — I959 Hypotension, unspecified: Secondary | ICD-10-CM

## 2023-03-21 NOTE — Progress Notes (Unsigned)
Location:  [facility] Room Number: 116 A Place of Service:  SNF (31) Provider:  Kenard Gower, DNP, FNP-BC  Patient Care Team: [name], MD as PCP - General (Family Medicine) Maisie Fus, MD as PCP - Cardiology (Cardiology)  Extended Emergency Contact Information Primary Emergency Contact: Cornish,Janice Address: [address]          [facility] [place], [place] [address] [place] Home Phone: [phone] Mobile Phone: [phone] Relation: Daughter Preferred language: English Interpreter needed? No  Code Status:  FULL CODE  Goals of care: Advanced Directive information    03/21/2023   11:31 AM  Advanced Directives  Does Patient Have a Medical Advance Directive? No     Chief Complaint  Patient presents with   Acute Visit    Hypotension    HPI:  Pt is a 89 y.o. female seen today for medical management of chronic diseases.  ***   Past Medical History:  Diagnosis Date   Atrial fibrillation (HCC)    Hernia of abdominal cavity    Kyphosis    Past Surgical History:  Procedure Laterality Date   ABDOMINAL HYSTERECTOMY     CATARACT EXTRACTION, BILATERAL     TONSILLECTOMY      Allergies  Allergen Reactions   Sulfa Antibiotics Anaphylaxis   Tape Other (See Comments)    BRUISES VERY EASILY!!!!   Codeine Nausea And Vomiting   Hydroxychloroquine     Other reaction(s): GI upset   Shellfish-Derived Products Other (See Comments)    Daughter was unsure of the reaction- "happened years ago" and patient avoids foods that contain shellfish   Latex Rash   Neosporin Original [Bacitracin-Neomycin-Polymyxin] Rash   Polysporin [Bacitracin-Polymyxin B] Rash    Outpatient Encounter Medications as of 03/21/2023  Medication Sig   acetaminophen (TYLENOL) 500 MG tablet Take 500 mg by mouth 3 (three) times daily.   atorvastatin (LIPITOR) 10 MG tablet TAKE 1 TABLET DAILY   cyanocobalamin 1000 MCG tablet Take 1,000 mcg by mouth daily.    fexofenadine (ALLEGRA ALLERGY) 60 MG tablet Take 1 tablet (60 mg total) by mouth daily as needed for allergies or rhinitis.   gabapentin (NEURONTIN) 400 MG capsule TAKE 1 CAPSULE DAILY   hydrocortisone 1 % ointment Apply 1 Application topically 2 (two) times daily.   meclizine (ANTIVERT) 12.5 MG tablet Take 1 tablet (12.5 mg total) by mouth 3 (three) times daily as needed for dizziness.   metoprolol tartrate (LOPRESSOR) 50 MG tablet TAKE 1 TABLET TWICE A DAY   Multiple Vitamin (MULTIVITAMIN) capsule Take 1 capsule by mouth daily.   omeprazole (PRILOSEC) 20 MG capsule Take 20 mg by mouth daily.   potassium chloride SA (KLOR-CON M) 20 MEQ tablet TAKE 1 TABLET BY MOUTH DAILY   predniSONE (DELTASONE) 5 MG tablet Take 5 mg by mouth daily.   sertraline (ZOLOFT) 100 MG tablet TAKE 1 TABLET DAILY   torsemide (DEMADEX) 20 MG tablet Take 20 mg by mouth daily.   traMADol (ULTRAM) 50 MG tablet Take 1 tablet (50 mg total) by mouth every 12 (twelve) hours as needed for moderate pain or severe pain.   traMADol (ULTRAM) 50 MG tablet Take 1 tablet (50 mg total) by mouth 2 (two) times daily.   Vitamin D, Cholecalciferol, 25 MCG (1000 UT) TABS Take 1,000 Units by mouth daily.   warfarin (COUMADIN) 5 MG tablet Take 2.5 mg by mouth daily. Every Tuesday, Thursday and Saturday. Take ONE tablet by mouth once daily on  Monday, Wednesday Friday and Sunday.   Zinc Oxide (TRIPLE PASTE) 12.8 % ointment Apply 1 Application topically. To buttock every shift.   No facility-administered encounter medications on file as of 03/21/2023.    Review of Systems ***    Immunization History  Administered Date(s) Administered   Pneumococcal Polysaccharide-23 08/30/2020   Pertinent  Health Maintenance Due  Topic Date Due   INFLUENZA VACCINE  03/13/2023   DEXA SCAN  09/04/2023 (Originally 01/05/2000)      12 /12/2020   12:23 PM 08/31/2021    2:46 PM 04/25/2022    4:59 PM 09/03/2022    3:38 PM 12/11/2022    5:03 PM  Fall Risk   Falls in the past year?  1 1 1 1   Was there an injury with Fall?  1 1 1 1   Was there an injury with Fall? - Comments  Patient fell injured back. Followed by Urgent care     Fall Risk Category Calculator  3 2 3 3   Fall Risk Category (Retired)  High Moderate    (RETIRED) Patient Fall Risk Level Low fall risk Moderate fall risk Moderate fall risk    Patient at Risk for Falls Due to   Other (Comment) Other (Comment) Other (Comment)  Fall risk Follow up   Falls evaluation completed Falls evaluation completed Falls evaluation completed     Vitals:   03/21/23 1132  BP: (!) 92/54  Pulse: 82  Resp: (!) 24  Temp: (!) 97.5 F (36.4 C)  SpO2: 92%  Weight: 91 lb 3.2 oz (41.4 kg)  Height: 5\' 3"  (1.6 m)   Body mass index is 16.16 kg/m.  Physical Exam     Labs reviewed: Recent Labs    03/13/23 2101 03/15/23 0045 03/16/23 0425  NA 134* 131* 132*  K 3.8 3.5 4.4  CL 95* 94* 98  CO2 29 26 26   GLUCOSE 103* 127* 132*  BUN 10 8 6*  CREATININE 0.79 0.82 0.66  CALCIUM 8.9 8.8* 8.7*  MG 1.9  --  2.1   Recent Labs    03/13/23 2101  AST 34  ALT 24  ALKPHOS 72  BILITOT 1.3*  PROT 6.0*  ALBUMIN 3.2*   Recent Labs    03/13/23 2101 03/15/23 0045  WBC 7.4 9.8  NEUTROABS 6.0  --   HGB 10.4* 10.6*  HCT 31.9* 32.7*  MCV 94.1 90.1  PLT 173 183   No results found for: "TSH" No results found for: "HGBA1C" No results found for: "CHOL", "HDL", "LDLCALC", "LDLDIRECT", "TRIG", "CHOLHDL"  Significant Diagnostic Results in last 30 days:  CT NO CHARGE  Result Date: 03/13/2023 CLINICAL DATA:  Hip pain, stress fracture suspected. EXAM: CT ADDITIONAL VIEWS AT NO CHARGE TECHNIQUE: Axial, coronal and sagittal osseous reformats of the pelvis were submitted from CT abdomen and pelvis. CONTRAST:  No additional COMPARISON:  CT abdomen and pelvis 03/13/2023 FINDINGS: Osseous: The bones are diffusely osteopenic. There is an acute nondisplaced fracture of the left superior pubic ramus. There is no  evidence for dislocation. There also findings suspicious for nondisplaced fracture of the anterior left sacral ala. There is mild asymmetric widening of the left sacroiliac joint anteriorly measuring up to 5 mm (right-sided measures 2 mm). There is a left hip total arthroplasty in anatomic alignment. No evidence for pubic diastasis. There are moderate degenerative changes of the right hip with joint space narrowing and osteophyte formation. There is mild compression deformity of the superior endplate of L5 which is favored as chronic.  Soft tissues: No focal soft tissue hematoma identified. Peripheral vascular calcifications are present. IMPRESSION: 1. Acute nondisplaced fracture of the left superior pubic ramus. 2. Findings suspicious for acute nondisplaced fracture of the anterior left sacral ala. 3. Mild asymmetric widening of the left sacroiliac joint anteriorly. 4. Left hip total arthroplasty in anatomic alignment. 5. Moderate degenerative changes of the right hip. 6. Mild compression deformity of the superior endplate of L5 is favored as chronic. Electronically Signed   By: Darliss Cheney M.D.   On: 03/13/2023 19:03   CT ABDOMEN PELVIS WO CONTRAST  Result Date: 03/13/2023 CLINICAL DATA:  Diarrhea EXAM: CT ABDOMEN AND PELVIS WITHOUT CONTRAST TECHNIQUE: Multidetector CT imaging of the abdomen and pelvis was performed following the standard protocol without IV contrast. RADIATION DOSE REDUCTION: This exam was performed according to the departmental dose-optimization program which includes automated exposure control, adjustment of the mA and/or kV according to patient size and/or use of iterative reconstruction technique. COMPARISON:  MRI lumbar spine 02/20/2021. Lumbar spine x-ray 05/11/2021. FINDINGS: Lower chest: There is atelectasis or scarring in the lung bases. The heart is enlarged. Hepatobiliary: No focal liver abnormality is seen. No gallstones, gallbladder wall thickening, or biliary dilatation.  Pancreas: Unremarkable. No pancreatic ductal dilatation or surrounding inflammatory changes. Spleen: Normal in size without focal abnormality. Adrenals/Urinary Tract: Adrenal glands are unremarkable. Kidneys are normal, without renal calculi, focal lesion, or hydronephrosis. Bladder is unremarkable. Stomach/Bowel: There is a large hiatal hernia containing the proximal stomach. The stomach is nondilated. No evidence of bowel wall thickening, distention, or inflammatory changes. The appendix is not seen. There is sigmoid colon diverticulosis. Vascular/Lymphatic: There severe atherosclerotic calcifications of the aorta and branch vessels. Peripheral vascular calcifications are present. No evidence for abdominal aortic aneurysm. No enlarged lymph nodes are seen. Reproductive: Status post hysterectomy. No adnexal masses. Other: The bones are diffusely osteopenic. Musculoskeletal: The bones are markedly osteopenic. There is levoconvex scoliosis of the lumbar spine with multilevel degenerative change. There are compression deformities of T10 and T11 which are incompletely imaged favored as chronic. There is also mild compression deformity of the superior endplate of L5 which is new from 2022, but favored as chronic. Recommend correlation with point tenderness. Left hip arthroplasty is present. There is an acute nondisplaced fracture of the left superior pubic ramus. IMPRESSION: 1. Acute nondisplaced fracture of the left superior pubic ramus. 2. Compression deformities of T10, T11, and L5 are favored as chronic. Recommend correlation with point tenderness. 3. Large hiatal hernia. 4. Cardiomegaly. 5. Sigmoid colon diverticulosis. Electronically Signed   By: Darliss Cheney M.D.   On: 03/13/2023 18:10   DG Knee Complete 4 Views Left  Result Date: 03/13/2023 CLINICAL DATA:  Pain after fall EXAM: LEFT KNEE - COMPLETE 4 VIEW COMPARISON:  None Available. FINDINGS: Osteopenia. No fracture or dislocation. There are some joint  space loss of the lateral compartment. Small osteophytes. Small osteophytes along the patellofemoral joint. Chondrocalcinosis. Extensive vascular calcifications are seen. No joint effusion on lateral view. IMPRESSION: Severe osteopenia.  Degenerative changes.  Chondrocalcinosis. Electronically Signed   By: Karen Kays M.D.   On: 03/13/2023 14:01   DG Hip Unilat W or Wo Pelvis 2-3 Views Left  Result Date: 03/13/2023 CLINICAL DATA:  Left hip pain after fall. EXAM: DG HIP (WITH OR WITHOUT PELVIS) 2-3V LEFT COMPARISON:  None Available. FINDINGS: Status post left total hip arthroplasty. Diffuse osteopenia is noted. No definite fracture or dislocation is noted. Vascular calcifications are noted. IMPRESSION: No definite acute abnormality seen.  Electronically Signed   By: Lupita Raider M.D.   On: 03/13/2023 13:59   DG Elbow Complete Left  Result Date: 03/13/2023 CLINICAL DATA:  Pain after fall EXAM: LEFT ELBOW - COMPLETE 4 VIEW COMPARISON:  None Available. FINDINGS: Osteopenia. No fracture or dislocation. Preserved joint spaces. No joint effusion seen on lateral view. IV site along the antecubital region. IMPRESSION: Osteopenia.  No acute osseous abnormality. Electronically Signed   By: Karen Kays M.D.   On: 03/13/2023 13:56   CT Head Wo Contrast  Result Date: 03/13/2023 CLINICAL DATA:  Fall, Coumadin EXAM: CT HEAD WITHOUT CONTRAST CT CERVICAL SPINE WITHOUT CONTRAST TECHNIQUE: Multidetector CT imaging of the head and cervical spine was performed following the standard protocol without intravenous contrast. Multiplanar CT image reconstructions of the cervical spine were also generated. RADIATION DOSE REDUCTION: This exam was performed according to the departmental dose-optimization program which includes automated exposure control, adjustment of the mA and/or kV according to patient size and/or use of iterative reconstruction technique. COMPARISON:  None Available. FINDINGS: CT HEAD FINDINGS Brain: No evidence  of acute infarction, hemorrhage, hydrocephalus, extra-axial collection or mass lesion/mass effect. Periventricular white matter hypodensity. Vascular: No hyperdense vessel or unexpected calcification. Skull: Normal. Negative for fracture or focal lesion. Sinuses/Orbits: No acute finding. Other: None. CT CERVICAL SPINE FINDINGS Alignment: Normal. Skull base and vertebrae: Osteopenia. No acute fracture. No primary bone lesion or focal pathologic process. Soft tissues and spinal canal: No prevertebral fluid or swelling. No visible canal hematoma. Disc levels: Generally mild multilevel cervical disc space height loss and osteophytosis. Upper chest: Negative. Other: None. IMPRESSION: 1. No acute intracranial pathology. Small-vessel white matter disease. 2. Osteopenia. No fracture or static subluxation of the cervical spine. 3. Generally mild multilevel cervical disc degenerative disease. Electronically Signed   By: Jearld Lesch M.D.   On: 03/13/2023 13:23   CT Cervical Spine Wo Contrast  Result Date: 03/13/2023 CLINICAL DATA:  Fall, Coumadin EXAM: CT HEAD WITHOUT CONTRAST CT CERVICAL SPINE WITHOUT CONTRAST TECHNIQUE: Multidetector CT imaging of the head and cervical spine was performed following the standard protocol without intravenous contrast. Multiplanar CT image reconstructions of the cervical spine were also generated. RADIATION DOSE REDUCTION: This exam was performed according to the departmental dose-optimization program which includes automated exposure control, adjustment of the mA and/or kV according to patient size and/or use of iterative reconstruction technique. COMPARISON:  None Available. FINDINGS: CT HEAD FINDINGS Brain: No evidence of acute infarction, hemorrhage, hydrocephalus, extra-axial collection or mass lesion/mass effect. Periventricular white matter hypodensity. Vascular: No hyperdense vessel or unexpected calcification. Skull: Normal. Negative for fracture or focal lesion. Sinuses/Orbits:  No acute finding. Other: None. CT CERVICAL SPINE FINDINGS Alignment: Normal. Skull base and vertebrae: Osteopenia. No acute fracture. No primary bone lesion or focal pathologic process. Soft tissues and spinal canal: No prevertebral fluid or swelling. No visible canal hematoma. Disc levels: Generally mild multilevel cervical disc space height loss and osteophytosis. Upper chest: Negative. Other: None. IMPRESSION: 1. No acute intracranial pathology. Small-vessel white matter disease. 2. Osteopenia. No fracture or static subluxation of the cervical spine. 3. Generally mild multilevel cervical disc degenerative disease. Electronically Signed   By: Jearld Lesch M.D.   On: 03/13/2023 13:23   DG Pelvis Portable  Result Date: 03/13/2023 CLINICAL DATA:  Fall EXAM: PORTABLE PELVIS 1-2 VIEWS COMPARISON:  None Available. FINDINGS: Marked osteopenia. No obvious displaced fracture or dislocation of the pelvis or proximal right femur. Status post left hip total arthroplasty. No obvious perihardware fracture.  Nonobstructive pattern of overlying bowel gas. Vascular calcinosis. IMPRESSION: 1. Marked osteopenia. No obvious displaced fracture or dislocation of the pelvis or proximal right femur. 2. Status post left hip total arthroplasty. No obvious perihardware fracture. 3. Plain radiographs are significantly limited in sensitivity for hip and pelvic fracture. Recommend CT if there is clinical suspicion for fracture. Electronically Signed   By: Jearld Lesch M.D.   On: 03/13/2023 13:17   DG Chest Portable 1 View  Result Date: 03/13/2023 CLINICAL DATA:  fall EXAM: PORTABLE CHEST 1 VIEW COMPARISON:  Chest radiograph 12/11/2022 FINDINGS: Asymmetric elevation of the right hemidiaphragm, unchanged from prior exam. Pleural effusion. Pneumothorax. Redemonstrated are multiple subacute to chronic bilateral rib fractures. No definite evidence of an acute displaced rib fracture. Cardiomegaly. Hazy bibasilar airspace opacities could  represent atelectasis or infection. Visualized upper abdomen is unremarkable IMPRESSION: 1. Hazy bibasilar airspace opacities could represent atelectasis or infection. 2. Redemonstrated are multiple subacute to chronic bilateral rib fractures. No definite evidence of an acute displaced rib fracture. Electronically Signed   By: Lorenza Cambridge M.D.   On: 03/13/2023 13:11    Assessment/Plan ***   Family/ staff Communication: Discussed plan of care with resident and charge nurse  Labs/tests ordered:     Kenard Gower, DNP, MSN, FNP-BC [facility] [phone] (Monday-Friday 8:00 a.m. - 5:00 p.m.) [phone] (after hours)

## 2023-03-27 LAB — PROTIME-INR: Protime: 23.8 — AB (ref 10.0–13.8)

## 2023-03-27 LAB — POCT INR: INR: 2.4 — AB (ref 0.80–1.20)

## 2023-03-28 ENCOUNTER — Non-Acute Institutional Stay (SKILLED_NURSING_FACILITY): Payer: Medicare Other | Admitting: Student

## 2023-03-28 ENCOUNTER — Encounter: Payer: Self-pay | Admitting: Student

## 2023-03-28 DIAGNOSIS — E43 Unspecified severe protein-calorie malnutrition: Secondary | ICD-10-CM | POA: Diagnosis not present

## 2023-03-28 DIAGNOSIS — S32512S Fracture of superior rim of left pubis, sequela: Secondary | ICD-10-CM | POA: Diagnosis not present

## 2023-03-28 DIAGNOSIS — I482 Chronic atrial fibrillation, unspecified: Secondary | ICD-10-CM | POA: Diagnosis not present

## 2023-03-28 DIAGNOSIS — L89890 Pressure ulcer of other site, unstageable: Secondary | ICD-10-CM | POA: Diagnosis not present

## 2023-03-28 DIAGNOSIS — I5032 Chronic diastolic (congestive) heart failure: Secondary | ICD-10-CM

## 2023-03-28 DIAGNOSIS — Z7952 Long term (current) use of systemic steroids: Secondary | ICD-10-CM

## 2023-03-28 DIAGNOSIS — L97514 Non-pressure chronic ulcer of other part of right foot with necrosis of bone: Secondary | ICD-10-CM | POA: Diagnosis not present

## 2023-03-28 DIAGNOSIS — I959 Hypotension, unspecified: Secondary | ICD-10-CM

## 2023-03-28 NOTE — Progress Notes (Deleted)
Provider:  Coralyn Helling, MD  Location:  [facility] Room Number: 115 A Place of Service:  SNF (31)  PCP: [name], MD Patient Care Team: [name], MD as PCP - General (Family Medicine) Maisie Fus, MD as PCP - Cardiology (Cardiology)  Extended Emergency Contact Information Primary Emergency Contact: Cornish,Janice Address: [address]          [facility] [place], [place] [address] [place] Home Phone: [phone] Mobile Phone: [phone] Relation: Daughter Preferred language: English Interpreter needed? No  Code Status: Full Code  Goals of Care: Advanced Directive information    03/28/2023   11:12 AM  Advanced Directives  Does Patient Have a Medical Advance Directive? No  Would patient like information on creating a medical advance directive? No - Patient declined      Chief Complaint  Patient presents with   New Admit To SNF    New admission to [facility]     HPI: Patient is a 89 y.o. female seen today for admission to  Past Medical History:  Diagnosis Date   Age-related osteoporosis without current pathological fracture    Per [facility], [facility]   Atrial fibrillation (HCC)    Chronic diastolic heart failure (HCC)    Per [facility]   Diaphragmatic hernia without obstruction or gangrene    Per [facility] system, [facility]   Fracture of left superior rim of pubis with routine healing    Per [facility], [facility]   Gastroesophageal reflux disease    Per [facility] system, [facility] [facility]   Hernia of abdominal cavity    History of fall    Per [facility]MR system, [employer] [facility]   Hyperlipemia    Per [facility]MR system, [facility]   Kyphosis    Polyneuropathy    Per [facility]MR system, [facility]   RA (rheumatoid arthritis) (HCC)    Per [facility]MR system, [facility]    Past Surgical History:  Procedure Laterality Date   ABDOMINAL HYSTERECTOMY     CATARACT EXTRACTION, BILATERAL     TONSILLECTOMY      reports that she has quit smoking. She has never used smokeless tobacco. She reports that she does not drink alcohol and does not use drugs. Social History   Socioeconomic History   Marital status: Widowed    Spouse name: Not on file   Number of children: Not on file   Years of education: Not on file   Highest education level: Not on file  Occupational History   Not on file  Tobacco Use   Smoking status: Former   Smokeless tobacco: Never  Vaping Use   Vaping status: Never Used  Substance and Sexual Activity   Alcohol use: No   Drug use: No   Sexual activity: Not on file  Other Topics Concern   Not on file  Social History Narrative   Not on file   Social Determinants of Health   Financial Resource Strain: Low Risk  (08/31/2021)   Overall Financial Resource Strain (CARDIA)    Difficulty of Paying Living Expenses: Not hard at all  Food Insecurity: No Food Insecurity (03/13/2023)   Hunger Vital Sign    Worried About Running Out of Food in the Last Year: Never true    Ran  Out of Food in the Last Year: Never true  Transportation Needs: No Transportation Needs (03/13/2023)   PRAPARE - Administrator, Civil Service (Medical): No    Lack of Transportation (Non-Medical): No  Physical Activity: Inactive (08/31/2021)   Exercise Vital Sign    Days of Exercise per Week: 0 days    Minutes of Exercise per Session: 0 min  Stress: No Stress Concern Present (08/31/2021)   [employer] of Occupational Health - Occupational Stress Questionnaire    Feeling of Stress : Not at all  Social Connections: Socially Isolated (08/31/2021)   Social Connection and Isolation Panel [NHANES]    Frequency of Communication with Friends and Family: More than three times a week    Frequency of Social Gatherings with Friends and Family: More than three times a  week    Attends Religious Services: Never    Database administrator or Organizations: No    Attends Banker Meetings: Never    Marital Status: Widowed  Intimate Partner Violence: Not At Risk (03/13/2023)   Humiliation, Afraid, Rape, and Kick questionnaire    Fear of Current or Ex-Partner: No    Emotionally Abused: No    Physically Abused: No    Sexually Abused: No    Functional Status Survey:    Family History  Problem Relation Age of Onset   Healthy Mother    Heart failure Father     Health Maintenance  Topic Date Due   DTaP/Tdap/Td (1 - Tdap) Never done   Zoster Vaccines- Shingrix (1 of 2) Never done   Pneumonia Vaccine 55+ Years old (2 of 2 - PCV) 08/30/2021   INFLUENZA VACCINE  03/13/2023   DEXA SCAN  09/04/2023 (Originally 01/05/2000)   COVID-19 Vaccine (1 - 2023-24 season) 09/20/2023 (Originally 04/12/2022)   Medicare Annual Wellness (AWV)  09/04/2023   HPV VACCINES  Aged Out    Allergies  Allergen Reactions   Sulfa Antibiotics Anaphylaxis   Tape Other (See Comments)    BRUISES VERY EASILY!!!!   Codeine Nausea And Vomiting   Hydroxychloroquine     Other reaction(s): GI upset   Shellfish-Derived Products Other (See Comments)    Daughter was unsure of the reaction- "happened years ago" and patient avoids foods that contain shellfish   Latex Rash   Neosporin Original [Bacitracin-Neomycin-Polymyxin] Rash   Polysporin [Bacitracin-Polymyxin B] Rash    Outpatient Encounter Medications as of 03/28/2023  Medication Sig   acetaminophen (TYLENOL) 500 MG tablet Take 500 mg by mouth 3 (three) times daily.   atorvastatin (LIPITOR) 10 MG tablet TAKE 1 TABLET DAILY   cyanocobalamin 1000 MCG tablet Take 1,000 mcg by mouth daily.   fexofenadine (ALLEGRA ALLERGY) 60 MG tablet Take 1 tablet (60 mg total) by mouth daily as needed for allergies or rhinitis.   gabapentin (NEURONTIN) 400 MG capsule TAKE 1 CAPSULE DAILY   meclizine (ANTIVERT) 12.5 MG tablet Take 1 tablet  (12.5 mg total) by mouth 3 (three) times daily as needed for dizziness.   metoprolol tartrate (LOPRESSOR) 50 MG tablet TAKE 1 TABLET TWICE A DAY   Multiple Vitamin (MULTIVITAMIN) capsule Take 1 capsule by mouth daily.   omeprazole (PRILOSEC) 20 MG capsule Take 20 mg by mouth daily.   potassium chloride SA (KLOR-CON M) 20 MEQ tablet TAKE 1 TABLET BY MOUTH DAILY   predniSONE (DELTASONE) 5 MG tablet Take 5 mg by mouth daily.   sertraline (ZOLOFT) 100 MG tablet TAKE 1 TABLET DAILY   torsemide (  DEMADEX) 20 MG tablet Take 20 mg by mouth daily.   traMADol (ULTRAM) 50 MG tablet Take 1 tablet (50 mg total) by mouth every 12 (twelve) hours as needed for moderate pain or severe pain.   traMADol (ULTRAM) 50 MG tablet Take 1 tablet (50 mg total) by mouth 2 (two) times daily.   Vitamin D, Cholecalciferol, 25 MCG (1000 UT) TABS Take 1,000 Units by mouth daily.   warfarin (COUMADIN) 5 MG tablet Take 2.5 mg by mouth daily. Every Tuesday, Thursday and Saturday. Take ONE tablet by mouth once daily on Monday, Wednesday Friday and Sunday.   Zinc Oxide (TRIPLE PASTE) 12.8 % ointment Apply 1 Application topically. To buttock every shift.   [DISCONTINUED] hydrocortisone 1 % ointment Apply 1 Application topically 2 (two) times daily.   No facility-administered encounter medications on file as of 03/28/2023.    Review of Systems  Vitals:   03/28/23 1106  BP: (!) 112/56  Pulse: (!) 110  Resp: 16  Temp: (!) 97.5 F (36.4 C)  SpO2: 92%  Weight: 89 lb 3.2 oz (40.5 kg)  Height: 5\' 3"  (1.6 m)   Body mass index is 15.8 kg/m. Physical Exam  Labs reviewed: Basic Metabolic Panel: Recent Labs    03/13/23 2101 03/15/23 0045 03/16/23 0425  NA 134* 131* 132*  K 3.8 3.5 4.4  CL 95* 94* 98  CO2 29 26 26   GLUCOSE 103* 127* 132*  BUN 10 8 6*  CREATININE 0.79 0.82 0.66  CALCIUM 8.9 8.8* 8.7*  MG 1.9  --  2.1   Liver Function Tests: Recent Labs    03/13/23 2101  AST 34  ALT 24  ALKPHOS 72  BILITOT 1.3*   PROT 6.0*  ALBUMIN 3.2*   No results for input(s): "LIPASE", "AMYLASE" in the last 8760 hours. No results for input(s): "AMMONIA" in the last 8760 hours. CBC: Recent Labs    03/13/23 2101 03/15/23 0045  WBC 7.4 9.8  NEUTROABS 6.0  --   HGB 10.4* 10.6*  HCT 31.9* 32.7*  MCV 94.1 90.1  PLT 173 183   Cardiac Enzymes: No results for input(s): "CKTOTAL", "CKMB", "CKMBINDEX", "TROPONINI" in the last 8760 hours. BNP: Invalid input(s): "POCBNP" No results found for: "HGBA1C" No results found for: "TSH" No results found for: "VITAMINB12" No results found for: "FOLATE" No results found for: "IRON", "TIBC", "FERRITIN"  Imaging and Procedures obtained prior to SNF admission: CT NO CHARGE  Result Date: 03/13/2023 CLINICAL DATA:  Hip pain, stress fracture suspected. EXAM: CT ADDITIONAL VIEWS AT NO CHARGE TECHNIQUE: Axial, coronal and sagittal osseous reformats of the pelvis were submitted from CT abdomen and pelvis. CONTRAST:  No additional COMPARISON:  CT abdomen and pelvis 03/13/2023 FINDINGS: Osseous: The bones are diffusely osteopenic. There is an acute nondisplaced fracture of the left superior pubic ramus. There is no evidence for dislocation. There also findings suspicious for nondisplaced fracture of the anterior left sacral ala. There is mild asymmetric widening of the left sacroiliac joint anteriorly measuring up to 5 mm (right-sided measures 2 mm). There is a left hip total arthroplasty in anatomic alignment. No evidence for pubic diastasis. There are moderate degenerative changes of the right hip with joint space narrowing and osteophyte formation. There is mild compression deformity of the superior endplate of L5 which is favored as chronic. Soft tissues: No focal soft tissue hematoma identified. Peripheral vascular calcifications are present. IMPRESSION: 1. Acute nondisplaced fracture of the left superior pubic ramus. 2. Findings suspicious for acute nondisplaced fracture  of the  anterior left sacral ala. 3. Mild asymmetric widening of the left sacroiliac joint anteriorly. 4. Left hip total arthroplasty in anatomic alignment. 5. Moderate degenerative changes of the right hip. 6. Mild compression deformity of the superior endplate of L5 is favored as chronic. Electronically Signed   By: Darliss Cheney M.D.   On: 03/13/2023 19:03   CT ABDOMEN PELVIS WO CONTRAST  Result Date: 03/13/2023 CLINICAL DATA:  Diarrhea EXAM: CT ABDOMEN AND PELVIS WITHOUT CONTRAST TECHNIQUE: Multidetector CT imaging of the abdomen and pelvis was performed following the standard protocol without IV contrast. RADIATION DOSE REDUCTION: This exam was performed according to the departmental dose-optimization program which includes automated exposure control, adjustment of the mA and/or kV according to patient size and/or use of iterative reconstruction technique. COMPARISON:  MRI lumbar spine 02/20/2021. Lumbar spine x-ray 05/11/2021. FINDINGS: Lower chest: There is atelectasis or scarring in the lung bases. The heart is enlarged. Hepatobiliary: No focal liver abnormality is seen. No gallstones, gallbladder wall thickening, or biliary dilatation. Pancreas: Unremarkable. No pancreatic ductal dilatation or surrounding inflammatory changes. Spleen: Normal in size without focal abnormality. Adrenals/Urinary Tract: Adrenal glands are unremarkable. Kidneys are normal, without renal calculi, focal lesion, or hydronephrosis. Bladder is unremarkable. Stomach/Bowel: There is a large hiatal hernia containing the proximal stomach. The stomach is nondilated. No evidence of bowel wall thickening, distention, or inflammatory changes. The appendix is not seen. There is sigmoid colon diverticulosis. Vascular/Lymphatic: There severe atherosclerotic calcifications of the aorta and branch vessels. Peripheral vascular calcifications are present. No evidence for abdominal aortic aneurysm. No enlarged lymph nodes are seen. Reproductive: Status  post hysterectomy. No adnexal masses. Other: The bones are diffusely osteopenic. Musculoskeletal: The bones are markedly osteopenic. There is levoconvex scoliosis of the lumbar spine with multilevel degenerative change. There are compression deformities of T10 and T11 which are incompletely imaged favored as chronic. There is also mild compression deformity of the superior endplate of L5 which is new from 2022, but favored as chronic. Recommend correlation with point tenderness. Left hip arthroplasty is present. There is an acute nondisplaced fracture of the left superior pubic ramus. IMPRESSION: 1. Acute nondisplaced fracture of the left superior pubic ramus. 2. Compression deformities of T10, T11, and L5 are favored as chronic. Recommend correlation with point tenderness. 3. Large hiatal hernia. 4. Cardiomegaly. 5. Sigmoid colon diverticulosis. Electronically Signed   By: Darliss Cheney M.D.   On: 03/13/2023 18:10   DG Knee Complete 4 Views Left  Result Date: 03/13/2023 CLINICAL DATA:  Pain after fall EXAM: LEFT KNEE - COMPLETE 4 VIEW COMPARISON:  None Available. FINDINGS: Osteopenia. No fracture or dislocation. There are some joint space loss of the lateral compartment. Small osteophytes. Small osteophytes along the patellofemoral joint. Chondrocalcinosis. Extensive vascular calcifications are seen. No joint effusion on lateral view. IMPRESSION: Severe osteopenia.  Degenerative changes.  Chondrocalcinosis. Electronically Signed   By: Karen Kays M.D.   On: 03/13/2023 14:01   DG Hip Unilat W or Wo Pelvis 2-3 Views Left  Result Date: 03/13/2023 CLINICAL DATA:  Left hip pain after fall. EXAM: DG HIP (WITH OR WITHOUT PELVIS) 2-3V LEFT COMPARISON:  None Available. FINDINGS: Status post left total hip arthroplasty. Diffuse osteopenia is noted. No definite fracture or dislocation is noted. Vascular calcifications are noted. IMPRESSION: No definite acute abnormality seen. Electronically Signed   By: Lupita Raider  M.D.   On: 03/13/2023 13:59   DG Elbow Complete Left  Result Date: 03/13/2023 CLINICAL DATA:  Pain after  fall EXAM: LEFT ELBOW - COMPLETE 4 VIEW COMPARISON:  None Available. FINDINGS: Osteopenia. No fracture or dislocation. Preserved joint spaces. No joint effusion seen on lateral view. IV site along the antecubital region. IMPRESSION: Osteopenia.  No acute osseous abnormality. Electronically Signed   By: Karen Kays M.D.   On: 03/13/2023 13:56   CT Head Wo Contrast  Result Date: 03/13/2023 CLINICAL DATA:  Fall, Coumadin EXAM: CT HEAD WITHOUT CONTRAST CT CERVICAL SPINE WITHOUT CONTRAST TECHNIQUE: Multidetector CT imaging of the head and cervical spine was performed following the standard protocol without intravenous contrast. Multiplanar CT image reconstructions of the cervical spine were also generated. RADIATION DOSE REDUCTION: This exam was performed according to the departmental dose-optimization program which includes automated exposure control, adjustment of the mA and/or kV according to patient size and/or use of iterative reconstruction technique. COMPARISON:  None Available. FINDINGS: CT HEAD FINDINGS Brain: No evidence of acute infarction, hemorrhage, hydrocephalus, extra-axial collection or mass lesion/mass effect. Periventricular white matter hypodensity. Vascular: No hyperdense vessel or unexpected calcification. Skull: Normal. Negative for fracture or focal lesion. Sinuses/Orbits: No acute finding. Other: None. CT CERVICAL SPINE FINDINGS Alignment: Normal. Skull base and vertebrae: Osteopenia. No acute fracture. No primary bone lesion or focal pathologic process. Soft tissues and spinal canal: No prevertebral fluid or swelling. No visible canal hematoma. Disc levels: Generally mild multilevel cervical disc space height loss and osteophytosis. Upper chest: Negative. Other: None. IMPRESSION: 1. No acute intracranial pathology. Small-vessel white matter disease. 2. Osteopenia. No fracture or static  subluxation of the cervical spine. 3. Generally mild multilevel cervical disc degenerative disease. Electronically Signed   By: Jearld Lesch M.D.   On: 03/13/2023 13:23   CT Cervical Spine Wo Contrast  Result Date: 03/13/2023 CLINICAL DATA:  Fall, Coumadin EXAM: CT HEAD WITHOUT CONTRAST CT CERVICAL SPINE WITHOUT CONTRAST TECHNIQUE: Multidetector CT imaging of the head and cervical spine was performed following the standard protocol without intravenous contrast. Multiplanar CT image reconstructions of the cervical spine were also generated. RADIATION DOSE REDUCTION: This exam was performed according to the departmental dose-optimization program which includes automated exposure control, adjustment of the mA and/or kV according to patient size and/or use of iterative reconstruction technique. COMPARISON:  None Available. FINDINGS: CT HEAD FINDINGS Brain: No evidence of acute infarction, hemorrhage, hydrocephalus, extra-axial collection or mass lesion/mass effect. Periventricular white matter hypodensity. Vascular: No hyperdense vessel or unexpected calcification. Skull: Normal. Negative for fracture or focal lesion. Sinuses/Orbits: No acute finding. Other: None. CT CERVICAL SPINE FINDINGS Alignment: Normal. Skull base and vertebrae: Osteopenia. No acute fracture. No primary bone lesion or focal pathologic process. Soft tissues and spinal canal: No prevertebral fluid or swelling. No visible canal hematoma. Disc levels: Generally mild multilevel cervical disc space height loss and osteophytosis. Upper chest: Negative. Other: None. IMPRESSION: 1. No acute intracranial pathology. Small-vessel white matter disease. 2. Osteopenia. No fracture or static subluxation of the cervical spine. 3. Generally mild multilevel cervical disc degenerative disease. Electronically Signed   By: Jearld Lesch M.D.   On: 03/13/2023 13:23   DG Pelvis Portable  Result Date: 03/13/2023 CLINICAL DATA:  Fall EXAM: PORTABLE PELVIS 1-2 VIEWS  COMPARISON:  None Available. FINDINGS: Marked osteopenia. No obvious displaced fracture or dislocation of the pelvis or proximal right femur. Status post left hip total arthroplasty. No obvious perihardware fracture. Nonobstructive pattern of overlying bowel gas. Vascular calcinosis. IMPRESSION: 1. Marked osteopenia. No obvious displaced fracture or dislocation of the pelvis or proximal right femur. 2. Status post left hip  total arthroplasty. No obvious perihardware fracture. 3. Plain radiographs are significantly limited in sensitivity for hip and pelvic fracture. Recommend CT if there is clinical suspicion for fracture. Electronically Signed   By: Jearld Lesch M.D.   On: 03/13/2023 13:17   DG Chest Portable 1 View  Result Date: 03/13/2023 CLINICAL DATA:  fall EXAM: PORTABLE CHEST 1 VIEW COMPARISON:  Chest radiograph 12/11/2022 FINDINGS: Asymmetric elevation of the right hemidiaphragm, unchanged from prior exam. Pleural effusion. Pneumothorax. Redemonstrated are multiple subacute to chronic bilateral rib fractures. No definite evidence of an acute displaced rib fracture. Cardiomegaly. Hazy bibasilar airspace opacities could represent atelectasis or infection. Visualized upper abdomen is unremarkable IMPRESSION: 1. Hazy bibasilar airspace opacities could represent atelectasis or infection. 2. Redemonstrated are multiple subacute to chronic bilateral rib fractures. No definite evidence of an acute displaced rib fracture. Electronically Signed   By: Lorenza Cambridge M.D.   On: 03/13/2023 13:11    Assessment/Plan There are no diagnoses linked to this encounter.   Family/ staff Communication:   Labs/tests ordered:

## 2023-03-28 NOTE — Progress Notes (Signed)
Location:  Other [facility] Room Number: 115 A Place of Service:  SNF [phone]) Provider:  Angelique Holm, Bettey Mare, MD  Patient Care Team: [name], MD as PCP - General (Family Medicine) Maisie Fus, MD as PCP - Cardiology (Cardiology)  Extended Emergency Contact Information Primary Emergency Contact: Cornish,Janice Address: [address]          [place], [place] [address] [place] of [place] Home Phone: [phone] Mobile Phone: [phone] Relation: Daughter Preferred language: English Interpreter needed? No  Code Status:  DNR  Goals of care: Advanced Directive information    03/28/2023   11:12 AM  Advanced Directives  Does Patient Have a Medical Advance Directive? No  Would patient like information on creating a medical advance directive? No - Patient declined     Chief Complaint  Patient presents with   New Admit To SNF    New admission to [facility]     HPI:  Pt is a 89 y.o. female seen today for an acute visit for Admission.  Patient was admitted to [facility]   She is a retired [employer] operational officer and they moved a lot because her husband was in the [employer]. She is originally from [place].  She was having trouble living by herself and her daughter with their three children she lived near them. She has a [place] house that she lived in for 13 years until she had trouble with balance and didn't break anything. She started to have bending in the spine and previously saw a rheumatologist. After a while she was seen by a spinal doctor because she has a large bend in her spine. As a result she has poor balance and uses a walker/cane but walker is best. Her daughter had her move in with her for safety. They lived in [place] until COVID-19 pandemic and she was with her for 2 years and she went into assisted Living at [facility] in [place] for almost 2 years 9/5 and she fell over there numerous times but no fractures. This fall led to a  fracture of the pelvis. She was on th floor between the 2 call bells and had to cry out for help. She had an Alexa which she had call [name]. So now she is going to go back to her daughter's home who is there 24/7.  She has UI. Weight loss in the last few years. Has a hiatal hernia.   She was managed non-operatively  Daughter, [name], present over the phone. Confirms patient has a DNR, but they aren't sure where it's located. Okay with a new copy.    Past Medical History:  Diagnosis Date   Age-related osteoporosis without current pathological fracture    Per [facility] EMR system, [facility]   Atrial fibrillation (HCC)    Chronic diastolic heart failure (HCC)    Per [facility] EMR system, [facility]   Diaphragmatic hernia without obstruction or gangrene    Per [facility] EMR system, [employer] [facility]   Fracture of left superior rim of pubis with routine healing    Per [facility] EMR system, [employer] [facility]   Gastroesophageal reflux disease    Per [facility] EMR system, [employer] [facility]   Hernia of abdominal cavity    History of fall    Per [facility] EMR system, [employer] [facility]   Hyperlipemia    Per [facility] EMR system, [employer]  Kyphosis    Polyneuropathy    Per [facility] EMR system, [facility]   RA (rheumatoid arthritis) [facility])    Per [facility] EMR system, [facility]   Past Surgical History:  Procedure Laterality Date   ABDOMINAL HYSTERECTOMY     CATARACT EXTRACTION, BILATERAL     TONSILLECTOMY      Allergies  Allergen Reactions   Sulfa Antibiotics Anaphylaxis   Tape Other (See Comments)    BRUISES VERY EASILY!!!!   Codeine Nausea And Vomiting   Hydroxychloroquine     Other reaction(s): GI upset   Shellfish-Derived Products Other (See Comments)    Daughter was unsure of the reaction- "happened years ago" and patient avoids foods that contain shellfish   Latex Rash   Neosporin Original [Bacitracin-Neomycin-Polymyxin] Rash    Polysporin [Bacitracin-Polymyxin B] Rash    Outpatient Encounter Medications as of 03/28/2023  Medication Sig   acetaminophen (TYLENOL) 500 MG tablet Take 500 mg by mouth 3 (three) times daily.   atorvastatin (LIPITOR) 10 MG tablet TAKE 1 TABLET DAILY   cyanocobalamin 1000 MCG tablet Take 1,000 mcg by mouth daily.   fexofenadine (ALLEGRA ALLERGY) 60 MG tablet Take 1 tablet (60 mg total) by mouth daily as needed for allergies or rhinitis.   gabapentin (NEURONTIN) 400 MG capsule TAKE 1 CAPSULE DAILY   meclizine (ANTIVERT) 12.5 MG tablet Take 1 tablet (12.5 mg total) by mouth 3 (three) times daily as needed for dizziness.   metoprolol tartrate (LOPRESSOR) 50 MG tablet TAKE 1 TABLET TWICE A DAY   Multiple Vitamin (MULTIVITAMIN) capsule Take 1 capsule by mouth daily.   omeprazole (PRILOSEC) 20 MG capsule Take 20 mg by mouth daily.   potassium chloride SA (KLOR-CON M) 20 MEQ tablet TAKE 1 TABLET BY MOUTH DAILY   predniSONE (DELTASONE) 5 MG tablet Take 5 mg by mouth daily.   sertraline (ZOLOFT) 100 MG tablet TAKE 1 TABLET DAILY   torsemide (DEMADEX) 20 MG tablet Take 20 mg by mouth daily.   traMADol (ULTRAM) 50 MG tablet Take 1 tablet (50 mg total) by mouth every 12 (twelve) hours as needed for moderate pain or severe pain.   traMADol (ULTRAM) 50 MG tablet Take 1 tablet (50 mg total) by mouth 2 (two) times daily.   Vitamin D, Cholecalciferol, 25 MCG (1000 UT) TABS Take 1,000 Units by mouth daily.   warfarin (COUMADIN) 5 MG tablet Take 2.5 mg by mouth daily. Every Tuesday, Thursday and Saturday. Take ONE tablet by mouth once daily on Monday, Wednesday Friday and Sunday.   Zinc Oxide (TRIPLE PASTE) 12.8 % ointment Apply 1 Application topically. To buttock every shift.   [DISCONTINUED] hydrocortisone 1 % ointment Apply 1 Application topically 2 (two) times daily.   No facility-administered encounter medications on file as of 03/28/2023.    Review of Systems  Immunization History  Administered  Date(s) Administered   Pneumococcal Polysaccharide-23 08/30/2020   Pertinent  Health Maintenance Due  Topic Date Due   INFLUENZA VACCINE  03/13/2023   DEXA SCAN  09/04/2023 (Originally 01/05/2000)      12 /12/2020   12:23 PM 08/31/2021    2:46 PM 04/25/2022    4:59 PM 09/03/2022    3:38 PM 12/11/2022    5:03 PM  Fall Risk  Falls in the past year?  1 1 1 1   Was there an injury with Fall?  1 1 1 1   Was there an injury with Fall? - Comments  Patient fell injured back. Followed by  Urgent care     Fall Risk Category Calculator  3 2 3 3   Fall Risk Category (Retired)  High Moderate    (RETIRED) Patient Fall Risk Level Low fall risk Moderate fall risk Moderate fall risk    Patient at Risk for Falls Due to   Other (Comment) Other (Comment) Other (Comment)  Fall risk Follow up   Falls evaluation completed Falls evaluation completed Falls evaluation completed   Functional Status Survey:    Vitals:   03/28/23 1106 03/28/23 1629  BP: (!) 112/56   Pulse: (!) 110 100  Resp: 16   Temp: (!) 97.5 F (36.4 C)   SpO2: 92%   Weight: 89 lb 3.2 oz (40.5 kg)   Height: 5\' 3"  (1.6 m)    Body mass index is 15.8 kg/m. Physical Exam Constitutional:      Comments: Thin with muscle wasting of temporal muscles, clavicles, bicep/tricep.  Cardiovascular:     Rate and Rhythm: Normal rate and regular rhythm.     Pulses: Normal pulses.  Pulmonary:     Effort: Pulmonary effort is normal.  Abdominal:     General: Abdomen is flat.     Palpations: Abdomen is soft.  Skin:    General: Skin is warm.  Neurological:     Mental Status: She is alert and oriented to person, place, and time.  Psychiatric:        Behavior: Behavior normal.     Labs reviewed: Recent Labs    03/13/23 2101 03/15/23 0045 03/16/23 0425  NA 134* 131* 132*  K 3.8 3.5 4.4  CL 95* 94* 98  CO2 29 26 26   GLUCOSE 103* 127* 132*  BUN 10 8 6*  CREATININE 0.79 0.82 0.66  CALCIUM 8.9 8.8* 8.7*  MG 1.9  --  2.1   Recent Labs     03/13/23 2101  AST 34  ALT 24  ALKPHOS 72  BILITOT 1.3*  PROT 6.0*  ALBUMIN 3.2*   Recent Labs    03/13/23 2101 03/15/23 0045  WBC 7.4 9.8  NEUTROABS 6.0  --   HGB 10.4* 10.6*  HCT 31.9* 32.7*  MCV 94.1 90.1  PLT 173 183   No results found for: "TSH" No results found for: "HGBA1C" No results found for: "CHOL", "HDL", "LDLCALC", "LDLDIRECT", "TRIG", "CHOLHDL"  Significant Diagnostic Results in last 30 days:  CT NO CHARGE  Result Date: 03/13/2023 CLINICAL DATA:  Hip pain, stress fracture suspected. EXAM: CT ADDITIONAL VIEWS AT NO CHARGE TECHNIQUE: Axial, coronal and sagittal osseous reformats of the pelvis were submitted from CT abdomen and pelvis. CONTRAST:  No additional COMPARISON:  CT abdomen and pelvis 03/13/2023 FINDINGS: Osseous: The bones are diffusely osteopenic. There is an acute nondisplaced fracture of the left superior pubic ramus. There is no evidence for dislocation. There also findings suspicious for nondisplaced fracture of the anterior left sacral ala. There is mild asymmetric widening of the left sacroiliac joint anteriorly measuring up to 5 mm (right-sided measures 2 mm). There is a left hip total arthroplasty in anatomic alignment. No evidence for pubic diastasis. There are moderate degenerative changes of the right hip with joint space narrowing and osteophyte formation. There is mild compression deformity of the superior endplate of L5 which is favored as chronic. Soft tissues: No focal soft tissue hematoma identified. Peripheral vascular calcifications are present. IMPRESSION: 1. Acute nondisplaced fracture of the left superior pubic ramus. 2. Findings suspicious for acute nondisplaced fracture of the anterior left sacral ala. 3. Mild  asymmetric widening of the left sacroiliac joint anteriorly. 4. Left hip total arthroplasty in anatomic alignment. 5. Moderate degenerative changes of the right hip. 6. Mild compression deformity of the superior endplate of L5 is favored  as chronic. Electronically Signed   By: Darliss Cheney M.D.   On: 03/13/2023 19:03   CT ABDOMEN PELVIS WO CONTRAST  Result Date: 03/13/2023 CLINICAL DATA:  Diarrhea EXAM: CT ABDOMEN AND PELVIS WITHOUT CONTRAST TECHNIQUE: Multidetector CT imaging of the abdomen and pelvis was performed following the standard protocol without IV contrast. RADIATION DOSE REDUCTION: This exam was performed according to the departmental dose-optimization program which includes automated exposure control, adjustment of the mA and/or kV according to patient size and/or use of iterative reconstruction technique. COMPARISON:  MRI lumbar spine 02/20/2021. Lumbar spine x-ray 05/11/2021. FINDINGS: Lower chest: There is atelectasis or scarring in the lung bases. The heart is enlarged. Hepatobiliary: No focal liver abnormality is seen. No gallstones, gallbladder wall thickening, or biliary dilatation. Pancreas: Unremarkable. No pancreatic ductal dilatation or surrounding inflammatory changes. Spleen: Normal in size without focal abnormality. Adrenals/Urinary Tract: Adrenal glands are unremarkable. Kidneys are normal, without renal calculi, focal lesion, or hydronephrosis. Bladder is unremarkable. Stomach/Bowel: There is a large hiatal hernia containing the proximal stomach. The stomach is nondilated. No evidence of bowel wall thickening, distention, or inflammatory changes. The appendix is not seen. There is sigmoid colon diverticulosis. Vascular/Lymphatic: There severe atherosclerotic calcifications of the aorta and branch vessels. Peripheral vascular calcifications are present. No evidence for abdominal aortic aneurysm. No enlarged lymph nodes are seen. Reproductive: Status post hysterectomy. No adnexal masses. Other: The bones are diffusely osteopenic. Musculoskeletal: The bones are markedly osteopenic. There is levoconvex scoliosis of the lumbar spine with multilevel degenerative change. There are compression deformities of T10 and T11  which are incompletely imaged favored as chronic. There is also mild compression deformity of the superior endplate of L5 which is new from 2022, but favored as chronic. Recommend correlation with point tenderness. Left hip arthroplasty is present. There is an acute nondisplaced fracture of the left superior pubic ramus. IMPRESSION: 1. Acute nondisplaced fracture of the left superior pubic ramus. 2. Compression deformities of T10, T11, and L5 are favored as chronic. Recommend correlation with point tenderness. 3. Large hiatal hernia. 4. Cardiomegaly. 5. Sigmoid colon diverticulosis. Electronically Signed   By: Darliss Cheney M.D.   On: 03/13/2023 18:10   DG Knee Complete 4 Views Left  Result Date: 03/13/2023 CLINICAL DATA:  Pain after fall EXAM: LEFT KNEE - COMPLETE 4 VIEW COMPARISON:  None Available. FINDINGS: Osteopenia. No fracture or dislocation. There are some joint space loss of the lateral compartment. Small osteophytes. Small osteophytes along the patellofemoral joint. Chondrocalcinosis. Extensive vascular calcifications are seen. No joint effusion on lateral view. IMPRESSION: Severe osteopenia.  Degenerative changes.  Chondrocalcinosis. Electronically Signed   By: Karen Kays M.D.   On: 03/13/2023 14:01   DG Hip Unilat W or Wo Pelvis 2-3 Views Left  Result Date: 03/13/2023 CLINICAL DATA:  Left hip pain after fall. EXAM: DG HIP (WITH OR WITHOUT PELVIS) 2-3V LEFT COMPARISON:  None Available. FINDINGS: Status post left total hip arthroplasty. Diffuse osteopenia is noted. No definite fracture or dislocation is noted. Vascular calcifications are noted. IMPRESSION: No definite acute abnormality seen. Electronically Signed   By: Lupita Raider M.D.   On: 03/13/2023 13:59   DG Elbow Complete Left  Result Date: 03/13/2023 CLINICAL DATA:  Pain after fall EXAM: LEFT ELBOW - COMPLETE 4 VIEW COMPARISON:  None Available. FINDINGS: Osteopenia. No fracture or dislocation. Preserved joint spaces. No joint effusion  seen on lateral view. IV site along the antecubital region. IMPRESSION: Osteopenia.  No acute osseous abnormality. Electronically Signed   By: Karen Kays M.D.   On: 03/13/2023 13:56   CT Head Wo Contrast  Result Date: 03/13/2023 CLINICAL DATA:  Fall, Coumadin EXAM: CT HEAD WITHOUT CONTRAST CT CERVICAL SPINE WITHOUT CONTRAST TECHNIQUE: Multidetector CT imaging of the head and cervical spine was performed following the standard protocol without intravenous contrast. Multiplanar CT image reconstructions of the cervical spine were also generated. RADIATION DOSE REDUCTION: This exam was performed according to the departmental dose-optimization program which includes automated exposure control, adjustment of the mA and/or kV according to patient size and/or use of iterative reconstruction technique. COMPARISON:  None Available. FINDINGS: CT HEAD FINDINGS Brain: No evidence of acute infarction, hemorrhage, hydrocephalus, extra-axial collection or mass lesion/mass effect. Periventricular white matter hypodensity. Vascular: No hyperdense vessel or unexpected calcification. Skull: Normal. Negative for fracture or focal lesion. Sinuses/Orbits: No acute finding. Other: None. CT CERVICAL SPINE FINDINGS Alignment: Normal. Skull base and vertebrae: Osteopenia. No acute fracture. No primary bone lesion or focal pathologic process. Soft tissues and spinal canal: No prevertebral fluid or swelling. No visible canal hematoma. Disc levels: Generally mild multilevel cervical disc space height loss and osteophytosis. Upper chest: Negative. Other: None. IMPRESSION: 1. No acute intracranial pathology. Small-vessel white matter disease. 2. Osteopenia. No fracture or static subluxation of the cervical spine. 3. Generally mild multilevel cervical disc degenerative disease. Electronically Signed   By: Jearld Lesch M.D.   On: 03/13/2023 13:23   CT Cervical Spine Wo Contrast  Result Date: 03/13/2023 CLINICAL DATA:  Fall, Coumadin EXAM:  CT HEAD WITHOUT CONTRAST CT CERVICAL SPINE WITHOUT CONTRAST TECHNIQUE: Multidetector CT imaging of the head and cervical spine was performed following the standard protocol without intravenous contrast. Multiplanar CT image reconstructions of the cervical spine were also generated. RADIATION DOSE REDUCTION: This exam was performed according to the departmental dose-optimization program which includes automated exposure control, adjustment of the mA and/or kV according to patient size and/or use of iterative reconstruction technique. COMPARISON:  None Available. FINDINGS: CT HEAD FINDINGS Brain: No evidence of acute infarction, hemorrhage, hydrocephalus, extra-axial collection or mass lesion/mass effect. Periventricular white matter hypodensity. Vascular: No hyperdense vessel or unexpected calcification. Skull: Normal. Negative for fracture or focal lesion. Sinuses/Orbits: No acute finding. Other: None. CT CERVICAL SPINE FINDINGS Alignment: Normal. Skull base and vertebrae: Osteopenia. No acute fracture. No primary bone lesion or focal pathologic process. Soft tissues and spinal canal: No prevertebral fluid or swelling. No visible canal hematoma. Disc levels: Generally mild multilevel cervical disc space height loss and osteophytosis. Upper chest: Negative. Other: None. IMPRESSION: 1. No acute intracranial pathology. Small-vessel white matter disease. 2. Osteopenia. No fracture or static subluxation of the cervical spine. 3. Generally mild multilevel cervical disc degenerative disease. Electronically Signed   By: Jearld Lesch M.D.   On: 03/13/2023 13:23   DG Pelvis Portable  Result Date: 03/13/2023 CLINICAL DATA:  Fall EXAM: PORTABLE PELVIS 1-2 VIEWS COMPARISON:  None Available. FINDINGS: Marked osteopenia. No obvious displaced fracture or dislocation of the pelvis or proximal right femur. Status post left hip total arthroplasty. No obvious perihardware fracture. Nonobstructive pattern of overlying bowel gas.  Vascular calcinosis. IMPRESSION: 1. Marked osteopenia. No obvious displaced fracture or dislocation of the pelvis or proximal right femur. 2. Status post left hip total arthroplasty. No obvious perihardware fracture. 3. Plain radiographs  are significantly limited in sensitivity for hip and pelvic fracture. Recommend CT if there is clinical suspicion for fracture. Electronically Signed   By: Jearld Lesch M.D.   On: 03/13/2023 13:17   DG Chest Portable 1 View  Result Date: 03/13/2023 CLINICAL DATA:  fall EXAM: PORTABLE CHEST 1 VIEW COMPARISON:  Chest radiograph 12/11/2022 FINDINGS: Asymmetric elevation of the right hemidiaphragm, unchanged from prior exam. Pleural effusion. Pneumothorax. Redemonstrated are multiple subacute to chronic bilateral rib fractures. No definite evidence of an acute displaced rib fracture. Cardiomegaly. Hazy bibasilar airspace opacities could represent atelectasis or infection. Visualized upper abdomen is unremarkable IMPRESSION: 1. Hazy bibasilar airspace opacities could represent atelectasis or infection. 2. Redemonstrated are multiple subacute to chronic bilateral rib fractures. No definite evidence of an acute displaced rib fracture. Electronically Signed   By: Lorenza Cambridge M.D.   On: 03/13/2023 13:11    Assessment/Plan 1. Skin ulcer of second toe of right foot with necrosis of bone (HCC) No wounds at this time, Continue to monitor based on risk.   2. Pressure injury of toe of right foot, unstageable (HCC) No signs of skin injury at this time monitor.   3. Chronic atrial fibrillation (HCC) Rate moderately controlled on metoprolol. Continue Warfarin with goal of 2-3.   4. Chronic heart failure with preserved ejection fraction (HFpEF) (HCC) Euvolemic on exam. No symptoms at this time. DC lasix given low BP and weight loss. Monitor with PCP.   5. Closed fracture of superior ramus of left pubis, sequela DOI 03/13/23. Pain well-controlled with tramadol and tylenol. Follow up  with surgery. Non surgical management. Patient will discharge home to her daughter's house instead of AL given her need for 24/7 monitoring. She is at high risk for falls and recurrent injury in the future. Chronic steroid use likely contributes to osteoporosis.   6. Hypotension, unspecified hypotension type Discontinue bp meds.   7. Protein-calorie malnutrition, severe Discussed the importance of adequate food intake and protein supplementation options.   Family/ staff Communication: [name], Nursing  PCP follow up: Recommend follow up labs with BMP, CBC, Hgb A1c, and follow up weight given discontinuation of lasix, spironolactone, and potassium supplementation. Encourage 30g of protein per meal given protein deficiency.   I spent greater than 50 minutes for the care of this patient in face to face time, chart review, clinical documentation, patient education.

## 2023-03-31 ENCOUNTER — Telehealth: Payer: Self-pay

## 2023-03-31 NOTE — Telephone Encounter (Signed)
Contacted pt's daughter, [name], to advise it is time to test INR. [name] reports pt had a fall on 8/1 and broke her pelvis. She was released to SNF and has just returned home yesterday.  She reports SNF said her INR would need to be checked on 8/30. Advised I would look for result on that day and if anything changes to contact the coumadin clinic. [name] verbalized understanding.

## 2023-04-02 ENCOUNTER — Other Ambulatory Visit: Payer: Self-pay | Admitting: Nurse Practitioner

## 2023-04-02 ENCOUNTER — Other Ambulatory Visit: Payer: Self-pay | Admitting: Family Medicine

## 2023-04-02 DIAGNOSIS — J302 Other seasonal allergic rhinitis: Secondary | ICD-10-CM

## 2023-04-02 DIAGNOSIS — S32512S Fracture of superior rim of left pubis, sequela: Secondary | ICD-10-CM

## 2023-04-03 NOTE — Telephone Encounter (Signed)
No our patient.

## 2023-04-03 NOTE — Telephone Encounter (Signed)
Patient is requesting a refill of the following medications: Requested Prescriptions   Pending Prescriptions Disp Refills   traMADol (ULTRAM) 50 MG tablet 60 tablet 0    Sig: Take 1 tablet (50 mg total) by mouth 2 (two) times daily.    Date of last refill:03/18/2023  Refill amount: 0  Treatment agreement date: unknown

## 2023-04-04 ENCOUNTER — Telehealth: Payer: Self-pay | Admitting: Family Medicine

## 2023-04-04 NOTE — Telephone Encounter (Signed)
Ok

## 2023-04-04 NOTE — Telephone Encounter (Signed)
Called and spoke with Mare Loan, PT with [facility], PT order given.

## 2023-04-04 NOTE — Telephone Encounter (Signed)
Verbal Orders - Physical Therapy  Mare Loan, PT with [facility] [phone] *Ok to leave a detailed message on this line  Start of Care:    Wed 04/09/23  *As per Daughter

## 2023-04-07 ENCOUNTER — Ambulatory Visit (INDEPENDENT_AMBULATORY_CARE_PROVIDER_SITE_OTHER): Payer: Medicare Other | Admitting: Family Medicine

## 2023-04-07 ENCOUNTER — Encounter: Payer: Self-pay | Admitting: Family Medicine

## 2023-04-07 VITALS — BP 120/68 | HR 105 | Temp 98.2°F | Ht 63.0 in | Wt 89.0 lb

## 2023-04-07 DIAGNOSIS — Z7901 Long term (current) use of anticoagulants: Secondary | ICD-10-CM | POA: Diagnosis not present

## 2023-04-07 DIAGNOSIS — I5032 Chronic diastolic (congestive) heart failure: Secondary | ICD-10-CM | POA: Diagnosis not present

## 2023-04-07 DIAGNOSIS — I482 Chronic atrial fibrillation, unspecified: Secondary | ICD-10-CM

## 2023-04-07 DIAGNOSIS — M159 Polyosteoarthritis, unspecified: Secondary | ICD-10-CM

## 2023-04-07 DIAGNOSIS — M150 Primary generalized (osteo)arthritis: Secondary | ICD-10-CM

## 2023-04-07 DIAGNOSIS — S32512S Fracture of superior rim of left pubis, sequela: Secondary | ICD-10-CM | POA: Diagnosis not present

## 2023-04-07 DIAGNOSIS — H6122 Impacted cerumen, left ear: Secondary | ICD-10-CM

## 2023-04-07 DIAGNOSIS — M0579 Rheumatoid arthritis with rheumatoid factor of multiple sites without organ or systems involvement: Secondary | ICD-10-CM | POA: Diagnosis not present

## 2023-04-07 MED ORDER — TRAMADOL HCL 50 MG PO TABS
ORAL_TABLET | ORAL | 4 refills | Status: DC
Start: 2023-04-07 — End: 2023-08-29

## 2023-04-07 NOTE — Addendum Note (Signed)
Addended by: Abbe Amsterdam R on: 04/07/2023 04:06 PM   Modules accepted: Level of Service

## 2023-04-07 NOTE — Patient Instructions (Addendum)
If you continue to notice the shortness of breath more frequently let us know.  A new prescription for tramadol was sent to [facility].  Upon review of chart I do not see where a prior refill request was made.  Unclear if it went to the assisted living facility.

## 2023-04-07 NOTE — Progress Notes (Signed)
Established Patient Office Visit   Subjective  Patient ID: Stacey [name], female    DOB: 06-20-1933  Age: 89 y.o. MRN: [id number]  Chief Complaint  Patient presents with   Hospitalization Follow-up    Pt hospitalized for hip fracture s/p fall, then discharged to rehab.  Pt accompanied by her daughter, [name]  HPI Patient is an 89 year old female with pmh sig for A-fib on Coumadin, RA with chronic prednisone use, severe osteoporosis, CHF who is seen for HFU.  Patient hospitalized 8/1-8/6 for closed fracture of left superior pubic ramus status post fall at [facility].  Conservative management per Ortho.  No surgical intervention indicated.  Home metoprolol discontinued.  Spironolactone disc continue at time of discharge.  Long-term prednisone use, over 25 years, thought to contribute to osteoporosis.  Since d/c from hospital, pt went to rehab at [facility] in [place], then returned home with her daughter where she has been for the last week.  Patient able to use walker to ambulate to restroom from her bed.  Patient trying to eat more during the day but has a hernia which makes it slightly difficult.  At time of discharge spironolactone, torsemide, potassium were discontinued.  Patient's tramadol was increased to help with pain from fracture.  Currently taking tramadol 100 mg in a.m., 50 mg around noon, and 50 mg in the evening.  Patient's daughter request ears to be checked especially left ear.  In the past cerumen impaction is caused vertigo for pt.  Past Medical History:  Diagnosis Date   Age-related osteoporosis without current pathological fracture    Per [facility] EMR system, [facility] [employer] [facility]   Atrial fibrillation (HCC)    Chronic diastolic heart failure (HCC)    Per [facility] EMR system, [facility]   Diaphragmatic hernia without obstruction or gangrene    Per [facility] EMR system, [employer] [facility]   Fracture of left superior rim of pubis with routine healing    Per  [facility] EMR system, [employer] [facility]   Gastroesophageal reflux disease    Per [facility] EMR system, [employer] [facility]   Hernia of abdominal cavity    History of fall    Per [facility] EMR system, [employer] [facility]   Hyperlipemia    Per [facility] EMR system, [employer] [facility]   Kyphosis    Polyneuropathy    Per [facility] EMR system, [employer] [facility]   RA (rheumatoid arthritis) (HCC)    Per [facility] EMR system, [employer] [facility]   Past Surgical History:  Procedure Laterality Date   ABDOMINAL HYSTERECTOMY     CATARACT EXTRACTION, BILATERAL     TONSILLECTOMY     Social History   Tobacco Use   Smoking status: Former   Smokeless tobacco: Never  Vaping Use   Vaping status: Never Used  Substance Use Topics   Alcohol use: No   Drug use: No   Family History  Problem Relation Age of Onset   Healthy Mother    Heart failure Father    Allergies  Allergen Reactions   Sulfa Antibiotics Anaphylaxis   Tape Other (See Comments)    BRUISES VERY EASILY!!!!   Codeine Nausea And Vomiting   Hydroxychloroquine     Other reaction(s): GI upset   Shellfish-Derived Products Other (See Comments)    Daughter was unsure of the reaction- "happened years ago" and patient avoids foods that contain shellfish   Latex Rash   Neosporin Original [  Bacitracin-Neomycin-Polymyxin] Rash   Polysporin [Bacitracin-Polymyxin B] Rash      ROS Negative unless stated above    Objective:     There were no vitals taken for this visit. BP Readings from Last 3 Encounters:  03/28/23 (!) 112/56  03/21/23 (!) 92/54  03/19/23 124/76   Wt Readings from Last 3 Encounters:  03/28/23 89 lb 3.2 oz (40.5 kg)  03/21/23 91 lb 3.2 oz (41.4 kg)  03/19/23 90 lb 8 oz (41.1 kg)      Physical Exam Constitutional:      General: She is not in acute distress.    Appearance: Normal appearance.  HENT:     Head: Normocephalic and atraumatic.     Right Ear: Tympanic membrane normal.     Left Ear: There is impacted  cerumen.     Ears:     Comments: Left canal impacted with cerumen.  Cerumen remained after irrigation.    Nose: Nose normal.     Mouth/Throat:     Mouth: Mucous membranes are moist.  Cardiovascular:     Rate and Rhythm: Normal rate. Rhythm irregular.     Heart sounds: Normal heart sounds. No murmur heard.    No gallop.  Pulmonary:     Effort: Pulmonary effort is normal. No respiratory distress.     Breath sounds: Normal breath sounds. No wheezing, rhonchi or rales.  Skin:    General: Skin is warm and dry.  Neurological:     Mental Status: She is alert and oriented to person, place, and time. Mental status is at baseline.     Comments: Gait not assessed as sitting in transport wheelchair.      No results found for any visits on 04/07/23.    Assessment & Plan:  Closed fracture of superior ramus of left pubis, sequela -stable -Taking increased dose of tramadol as needed for pain.  Impacted cerumen of left ear -Consent obtained.  Left ear irrigated.  Patient tolerated procedure well.  Cerumen remained after irrigation. -OTC Debrox eardrops.  Primary osteoarthritis involving multiple joints -     traMADol HCl; Take 2 tablets (100 mg) in a.m., 1 tablet (50 mg) at lunch, and 1 tablet (50 mg) in evening as needed for moderate to severe pain from arthritis and hip fracture.  Dispense: 120 tablet; Refill: 4  Chronic atrial fibrillation (HCC) -Stable -Continue Coumadin -Continue metoprolol 50 mg twice daily -Continue follow-up with cardiology  Chronic anticoagulation -continue coumadin as instructed  Rheumatoid arthritis involving multiple sites with positive rheumatoid factor (HCC) -Stable -Continue prednisone 5 mg daily.  Chronic medication on x 20 yrs -Continue tramadol as needed  Chronic heart failure with preserved ejection fraction (HFpEF) (HCC) -Euvolemic -Continue metoprolol to tartrate 50 mg twice daily. -Spironolactone and torsemide discontinued during  hospitalization due to hypotension. -Monitor closely for signs of hypotension. -Continue follow-up with cardiology   Return if symptoms worsen or fail to improve.   [name], MD

## 2023-04-14 LAB — POCT INR: INR: 2 (ref 2.0–3.0)

## 2023-04-15 ENCOUNTER — Telehealth: Payer: Self-pay | Admitting: Family Medicine

## 2023-04-15 NOTE — Telephone Encounter (Signed)
Rosa from [facility] call and stated will go back out for PT ON 04/21/23 reason pt have reschedule so they will try again.

## 2023-04-16 ENCOUNTER — Ambulatory Visit (INDEPENDENT_AMBULATORY_CARE_PROVIDER_SITE_OTHER): Payer: Medicare Other

## 2023-04-16 DIAGNOSIS — Z7901 Long term (current) use of anticoagulants: Secondary | ICD-10-CM | POA: Diagnosis not present

## 2023-04-16 NOTE — Progress Notes (Signed)
I have reviewed and agree with note, evaluation, plan.   Stephen Hunter, MD  

## 2023-04-16 NOTE — Progress Notes (Signed)
Patient's daughter, [name], helps test INR with home [employer] analyzer and submitted results via portal. [name] was advised at last phone call to test pt on 8/30 but pt was not tested until 9/2. Received result today when reviewing [employer] website while out of the office this week.  Continue 1/2 tablet daily except take 1 tablet on Thursdays. Recheck in 2 weeks, on 04/28/23. LVM for pt's daughter with dosing instructions and recheck in 2 weeks.

## 2023-04-16 NOTE — Patient Instructions (Addendum)
Pre visit review using our clinic review tool, if applicable. No additional management support is needed unless otherwise documented below in the visit note.  Continue 1/2 tablet daily except take 1 tablet on Thursdays. Recheck in 2 weeks, on 04/28/23.

## 2023-04-22 ENCOUNTER — Emergency Department (HOSPITAL_COMMUNITY): Payer: Medicare Other

## 2023-04-22 ENCOUNTER — Other Ambulatory Visit: Payer: Self-pay

## 2023-04-22 ENCOUNTER — Inpatient Hospital Stay (HOSPITAL_COMMUNITY)
Admission: EM | Admit: 2023-04-22 | Discharge: 2023-04-30 | DRG: 291 | Disposition: A | Discharge status: Home Health | Payer: Medicare Other | Attending: Internal Medicine | Admitting: Internal Medicine

## 2023-04-22 ENCOUNTER — Encounter (HOSPITAL_COMMUNITY): Payer: Self-pay | Admitting: Pharmacy Technician

## 2023-04-22 DIAGNOSIS — I5031 Acute diastolic (congestive) heart failure: Secondary | ICD-10-CM | POA: Diagnosis present

## 2023-04-22 DIAGNOSIS — Y92009 Unspecified place in unspecified non-institutional (private) residence as the place of occurrence of the external cause: Secondary | ICD-10-CM

## 2023-04-22 DIAGNOSIS — D649 Anemia, unspecified: Secondary | ICD-10-CM | POA: Diagnosis present

## 2023-04-22 DIAGNOSIS — I110 Hypertensive heart disease with heart failure: Secondary | ICD-10-CM | POA: Diagnosis present

## 2023-04-22 DIAGNOSIS — R000 Tachycardia, unspecified: Secondary | ICD-10-CM | POA: Diagnosis present

## 2023-04-22 DIAGNOSIS — G4700 Insomnia, unspecified: Secondary | ICD-10-CM | POA: Diagnosis present

## 2023-04-22 DIAGNOSIS — J90 Pleural effusion, not elsewhere classified: Secondary | ICD-10-CM | POA: Diagnosis not present

## 2023-04-22 DIAGNOSIS — Z7952 Long term (current) use of systemic steroids: Secondary | ICD-10-CM | POA: Diagnosis not present

## 2023-04-22 DIAGNOSIS — R0902 Hypoxemia: Secondary | ICD-10-CM | POA: Diagnosis not present

## 2023-04-22 DIAGNOSIS — R0602 Shortness of breath: Secondary | ICD-10-CM | POA: Diagnosis not present

## 2023-04-22 DIAGNOSIS — I959 Hypotension, unspecified: Secondary | ICD-10-CM | POA: Diagnosis present

## 2023-04-22 DIAGNOSIS — Z881 Allergy status to other antibiotic agents status: Secondary | ICD-10-CM | POA: Diagnosis not present

## 2023-04-22 DIAGNOSIS — Z8249 Family history of ischemic heart disease and other diseases of the circulatory system: Secondary | ICD-10-CM

## 2023-04-22 DIAGNOSIS — R918 Other nonspecific abnormal finding of lung field: Secondary | ICD-10-CM | POA: Diagnosis not present

## 2023-04-22 DIAGNOSIS — I4811 Longstanding persistent atrial fibrillation: Secondary | ICD-10-CM | POA: Diagnosis not present

## 2023-04-22 DIAGNOSIS — L89626 Pressure-induced deep tissue damage of left heel: Secondary | ICD-10-CM | POA: Diagnosis present

## 2023-04-22 DIAGNOSIS — Z681 Body mass index (BMI) 19 or less, adult: Secondary | ICD-10-CM | POA: Diagnosis not present

## 2023-04-22 DIAGNOSIS — Z90710 Acquired absence of both cervix and uterus: Secondary | ICD-10-CM

## 2023-04-22 DIAGNOSIS — Z9841 Cataract extraction status, right eye: Secondary | ICD-10-CM

## 2023-04-22 DIAGNOSIS — Z885 Allergy status to narcotic agent status: Secondary | ICD-10-CM

## 2023-04-22 DIAGNOSIS — Z91013 Allergy to seafood: Secondary | ICD-10-CM

## 2023-04-22 DIAGNOSIS — E785 Hyperlipidemia, unspecified: Secondary | ICD-10-CM | POA: Diagnosis not present

## 2023-04-22 DIAGNOSIS — T380X5A Adverse effect of glucocorticoids and synthetic analogues, initial encounter: Secondary | ICD-10-CM | POA: Diagnosis present

## 2023-04-22 DIAGNOSIS — Z87891 Personal history of nicotine dependence: Secondary | ICD-10-CM | POA: Diagnosis not present

## 2023-04-22 DIAGNOSIS — Z91040 Latex allergy status: Secondary | ICD-10-CM

## 2023-04-22 DIAGNOSIS — H538 Other visual disturbances: Secondary | ICD-10-CM | POA: Diagnosis present

## 2023-04-22 DIAGNOSIS — F32A Depression, unspecified: Secondary | ICD-10-CM | POA: Diagnosis present

## 2023-04-22 DIAGNOSIS — M419 Scoliosis, unspecified: Secondary | ICD-10-CM | POA: Diagnosis present

## 2023-04-22 DIAGNOSIS — R636 Underweight: Secondary | ICD-10-CM | POA: Diagnosis present

## 2023-04-22 DIAGNOSIS — K219 Gastro-esophageal reflux disease without esophagitis: Secondary | ICD-10-CM | POA: Diagnosis present

## 2023-04-22 DIAGNOSIS — Z7901 Long term (current) use of anticoagulants: Secondary | ICD-10-CM | POA: Diagnosis not present

## 2023-04-22 DIAGNOSIS — G459 Transient cerebral ischemic attack, unspecified: Secondary | ICD-10-CM | POA: Diagnosis not present

## 2023-04-22 DIAGNOSIS — I6782 Cerebral ischemia: Secondary | ICD-10-CM | POA: Diagnosis not present

## 2023-04-22 DIAGNOSIS — R441 Visual hallucinations: Secondary | ICD-10-CM | POA: Diagnosis not present

## 2023-04-22 DIAGNOSIS — M069 Rheumatoid arthritis, unspecified: Secondary | ICD-10-CM | POA: Diagnosis not present

## 2023-04-22 DIAGNOSIS — M810 Age-related osteoporosis without current pathological fracture: Secondary | ICD-10-CM | POA: Diagnosis present

## 2023-04-22 DIAGNOSIS — I482 Chronic atrial fibrillation, unspecified: Secondary | ICD-10-CM | POA: Diagnosis not present

## 2023-04-22 DIAGNOSIS — I10 Essential (primary) hypertension: Secondary | ICD-10-CM | POA: Diagnosis not present

## 2023-04-22 DIAGNOSIS — I5033 Acute on chronic diastolic (congestive) heart failure: Secondary | ICD-10-CM | POA: Diagnosis present

## 2023-04-22 DIAGNOSIS — E876 Hypokalemia: Secondary | ICD-10-CM | POA: Diagnosis present

## 2023-04-22 DIAGNOSIS — I708 Atherosclerosis of other arteries: Secondary | ICD-10-CM | POA: Diagnosis present

## 2023-04-22 DIAGNOSIS — I081 Rheumatic disorders of both mitral and tricuspid valves: Secondary | ICD-10-CM | POA: Diagnosis not present

## 2023-04-22 DIAGNOSIS — R42 Dizziness and giddiness: Secondary | ICD-10-CM | POA: Diagnosis present

## 2023-04-22 DIAGNOSIS — R531 Weakness: Secondary | ICD-10-CM | POA: Diagnosis not present

## 2023-04-22 DIAGNOSIS — I739 Peripheral vascular disease, unspecified: Secondary | ICD-10-CM | POA: Diagnosis not present

## 2023-04-22 DIAGNOSIS — E871 Hypo-osmolality and hyponatremia: Secondary | ICD-10-CM | POA: Diagnosis not present

## 2023-04-22 DIAGNOSIS — I509 Heart failure, unspecified: Secondary | ICD-10-CM | POA: Diagnosis not present

## 2023-04-22 DIAGNOSIS — R9089 Other abnormal findings on diagnostic imaging of central nervous system: Secondary | ICD-10-CM | POA: Diagnosis not present

## 2023-04-22 DIAGNOSIS — I4891 Unspecified atrial fibrillation: Secondary | ICD-10-CM | POA: Diagnosis not present

## 2023-04-22 DIAGNOSIS — Z66 Do not resuscitate: Secondary | ICD-10-CM | POA: Diagnosis present

## 2023-04-22 DIAGNOSIS — Z9842 Cataract extraction status, left eye: Secondary | ICD-10-CM

## 2023-04-22 DIAGNOSIS — Z79899 Other long term (current) drug therapy: Secondary | ICD-10-CM | POA: Diagnosis not present

## 2023-04-22 DIAGNOSIS — J810 Acute pulmonary edema: Principal | ICD-10-CM

## 2023-04-22 DIAGNOSIS — E2740 Unspecified adrenocortical insufficiency: Secondary | ICD-10-CM | POA: Diagnosis not present

## 2023-04-22 DIAGNOSIS — Z882 Allergy status to sulfonamides status: Secondary | ICD-10-CM

## 2023-04-22 LAB — CBC WITH DIFFERENTIAL/PLATELET
Abs Immature Granulocytes: 0.03 10*3/uL (ref 0.00–0.07)
Basophils Absolute: 0 10*3/uL (ref 0.0–0.1)
Basophils Relative: 1 %
Eosinophils Absolute: 0 10*3/uL (ref 0.0–0.5)
Eosinophils Relative: 0 %
HCT: 37.6 % (ref 36.0–46.0)
Hemoglobin: 11.4 g/dL — ABNORMAL LOW (ref 12.0–15.0)
Immature Granulocytes: 0 %
Lymphocytes Relative: 4 %
Lymphs Abs: 0.3 10*3/uL — ABNORMAL LOW (ref 0.7–4.0)
MCH: 29.5 pg (ref 26.0–34.0)
MCHC: 30.3 g/dL (ref 30.0–36.0)
MCV: 97.4 fL (ref 80.0–100.0)
Monocytes Absolute: 0.4 10*3/uL (ref 0.1–1.0)
Monocytes Relative: 6 %
Neutro Abs: 6.6 10*3/uL (ref 1.7–7.7)
Neutrophils Relative %: 89 %
Platelets: 268 10*3/uL (ref 150–400)
RBC: 3.86 MIL/uL — ABNORMAL LOW (ref 3.87–5.11)
RDW: 15.2 % (ref 11.5–15.5)
WBC: 7.5 10*3/uL (ref 4.0–10.5)
nRBC: 0 % (ref 0.0–0.2)

## 2023-04-22 LAB — BASIC METABOLIC PANEL
Anion gap: 9 (ref 5–15)
BUN: 12 mg/dL (ref 8–23)
CO2: 24 mmol/L (ref 22–32)
Calcium: 8.8 mg/dL — ABNORMAL LOW (ref 8.9–10.3)
Chloride: 101 mmol/L (ref 98–111)
Creatinine, Ser: 0.52 mg/dL (ref 0.44–1.00)
GFR, Estimated: >60 mL/min (ref >60)
Glucose, Bld: 111 mg/dL — ABNORMAL HIGH (ref 70–99)
Potassium: 3.9 mmol/L (ref 3.5–5.1)
Sodium: 134 mmol/L — ABNORMAL LOW (ref 135–145)

## 2023-04-22 LAB — TROPONIN I (HIGH SENSITIVITY)
Troponin I (High Sensitivity): 6 ng/L (ref <18)
Troponin I (High Sensitivity): 7 ng/L (ref <18)

## 2023-04-22 LAB — PROTIME-INR
INR: 2 — ABNORMAL HIGH (ref 0.8–1.2)
Prothrombin Time: 22.7 s — ABNORMAL HIGH (ref 11.4–15.2)

## 2023-04-22 LAB — BRAIN NATRIURETIC PEPTIDE: B Natriuretic Peptide: 636.3 pg/mL — ABNORMAL HIGH (ref 0.0–100.0)

## 2023-04-22 MED ORDER — FUROSEMIDE 10 MG/ML IJ SOLN
40.0000 mg | Freq: Every day | INTRAMUSCULAR | Status: DC
  Administered 2023-04-23 – 2023-04-24 (×2): 40 mg via INTRAVENOUS
  Filled 2023-04-22 (×2): qty 4

## 2023-04-22 MED ORDER — PREDNISONE 5 MG PO TABS
5.0000 mg | ORAL_TABLET | Freq: Every day | ORAL | Status: DC
  Administered 2023-04-23 – 2023-04-30 (×8): 5 mg via ORAL
  Filled 2023-04-22 (×8): qty 1

## 2023-04-22 MED ORDER — WARFARIN SODIUM 2.5 MG PO TABS
2.5000 mg | ORAL_TABLET | Freq: Once | ORAL | Status: AC
  Administered 2023-04-22: 2.5 mg via ORAL
  Filled 2023-04-22: qty 1

## 2023-04-22 MED ORDER — ACETAMINOPHEN 325 MG PO TABS
650.0000 mg | ORAL_TABLET | ORAL | Status: DC | PRN
  Administered 2023-04-25 – 2023-04-30 (×8): 650 mg via ORAL
  Filled 2023-04-22 (×8): qty 2

## 2023-04-22 MED ORDER — WARFARIN - PHARMACIST DOSING INPATIENT: Freq: Every day | Status: DC

## 2023-04-22 MED ORDER — METOPROLOL TARTRATE 50 MG PO TABS
50.0000 mg | ORAL_TABLET | Freq: Two times a day (BID) | ORAL | Status: DC
  Administered 2023-04-22 – 2023-04-25 (×6): 50 mg via ORAL
  Filled 2023-04-22: qty 1
  Filled 2023-04-22: qty 2
  Filled 2023-04-22 (×4): qty 1

## 2023-04-22 MED ORDER — HYDRALAZINE HCL 20 MG/ML IJ SOLN
5.0000 mg | Freq: Four times a day (QID) | INTRAMUSCULAR | Status: DC | PRN
  Administered 2023-04-23: 5 mg via INTRAVENOUS
  Filled 2023-04-22: qty 1

## 2023-04-22 MED ORDER — ATORVASTATIN CALCIUM 10 MG PO TABS
10.0000 mg | ORAL_TABLET | Freq: Every day | ORAL | Status: DC
  Administered 2023-04-22 – 2023-04-30 (×9): 10 mg via ORAL
  Filled 2023-04-22 (×9): qty 1

## 2023-04-22 MED ORDER — GABAPENTIN 400 MG PO CAPS
400.0000 mg | ORAL_CAPSULE | Freq: Every day | ORAL | Status: DC
  Administered 2023-04-22 – 2023-04-30 (×9): 400 mg via ORAL
  Filled 2023-04-22 (×9): qty 1

## 2023-04-22 MED ORDER — SODIUM CHLORIDE 0.9% FLUSH: 3.0000 mL | INTRAVENOUS | Status: DC | PRN

## 2023-04-22 MED ORDER — SODIUM CHLORIDE 0.9 % IV SOLN: 250.0000 mL | INTRAVENOUS | Status: DC | PRN

## 2023-04-22 MED ORDER — ONDANSETRON HCL 4 MG/2ML IJ SOLN: 4.0000 mg | Freq: Four times a day (QID) | INTRAMUSCULAR | Status: DC | PRN

## 2023-04-22 MED ORDER — SODIUM CHLORIDE 0.9% FLUSH
3.0000 mL | Freq: Two times a day (BID) | INTRAVENOUS | Status: DC
  Administered 2023-04-22 – 2023-04-30 (×14): 3 mL via INTRAVENOUS

## 2023-04-22 MED ORDER — PANTOPRAZOLE SODIUM 40 MG PO TBEC
40.0000 mg | DELAYED_RELEASE_TABLET | Freq: Every day | ORAL | Status: DC
  Administered 2023-04-23 – 2023-04-30 (×8): 40 mg via ORAL
  Filled 2023-04-22 (×8): qty 1

## 2023-04-22 MED ORDER — LISINOPRIL 5 MG PO TABS
5.0000 mg | ORAL_TABLET | Freq: Every day | ORAL | Status: DC
  Administered 2023-04-23 – 2023-04-25 (×3): 5 mg via ORAL
  Filled 2023-04-22 (×3): qty 1

## 2023-04-22 MED ORDER — FUROSEMIDE 10 MG/ML IJ SOLN
40.0000 mg | Freq: Once | INTRAMUSCULAR | Status: AC
  Administered 2023-04-22: 40 mg via INTRAVENOUS
  Filled 2023-04-22: qty 4

## 2023-04-22 MED ORDER — SERTRALINE HCL 100 MG PO TABS
100.0000 mg | ORAL_TABLET | Freq: Every day | ORAL | Status: DC
  Administered 2023-04-23 – 2023-04-30 (×8): 100 mg via ORAL
  Filled 2023-04-22 (×8): qty 1

## 2023-04-22 MED ORDER — TRAMADOL HCL 50 MG PO TABS
50.0000 mg | ORAL_TABLET | Freq: Three times a day (TID) | ORAL | Status: DC | PRN
  Administered 2023-04-22 – 2023-04-24 (×5): 50 mg via ORAL
  Filled 2023-04-22 (×5): qty 1

## 2023-04-22 NOTE — ED Provider Notes (Signed)
[facility] EMERGENCY DEPARTMENT AT [facility] Provider Note   CSN: [id number] Arrival date & time: 04/22/23  1513     History  Chief Complaint  Patient presents with   Weakness   Shortness of Breath    Stacey [name] is a 89 y.o. female.  This is an 89 year old female who is here today primarily for shortness of breath that began at 3:00 this morning.  Patient has history of atrial fibrillation, on warfarin.  She says that she has been having issues with insomnia for several weeks, and has had issues with periods of rapid breathing.  This morning, she began to feel short of breath.  She also endorses having difficulty with bilateral blurry vision which has been intermittent over the past several weeks as well.  At this time, patient not having any visual problems.  She does endorse still feeling short of breath.  Reviewed the patient's hospitalization note from August of this past year when she was hospitalized for a pubic ramus fracture.   Weakness Associated symptoms: shortness of breath   Shortness of Breath      Home Medications Prior to Admission medications   Medication Sig Start Date End Date Taking? Authorizing Provider  acetaminophen (TYLENOL) 500 MG tablet Take 500 mg by mouth 3 (three) times daily.    [provider]  atorvastatin (LIPITOR) 10 MG tablet TAKE 1 TABLET DAILY 03/05/23   [name], MD  cyanocobalamin 1000 MCG tablet Take 1,000 mcg by mouth daily.    [provider]  fexofenadine (ALLEGRA) 60 MG tablet TAKE 1 TABLET DAILY AS NEEDED FOR ALLERGIES OR RHINITIS 04/02/23   [name], MD  gabapentin (NEURONTIN) 400 MG capsule TAKE 1 CAPSULE DAILY 08/22/22   [name], MD  meclizine (ANTIVERT) 12.5 MG tablet Take 1 tablet (12.5 mg total) by mouth 3 (three) times daily as needed for dizziness. 06/08/21   [name], MD  metoprolol tartrate (LOPRESSOR) 50 MG tablet TAKE 1 TABLET TWICE A DAY 05/06/22    [name], MD  Multiple Vitamin (MULTIVITAMIN) capsule Take 1 capsule by mouth daily.    [provider]  omeprazole (PRILOSEC) 20 MG capsule Take 20 mg by mouth daily.    [provider]  predniSONE (DELTASONE) 5 MG tablet Take 5 mg by mouth daily.    [provider]  sertraline (ZOLOFT) 100 MG tablet TAKE 1 TABLET DAILY 04/08/22   [name], MD  traMADol (ULTRAM) 50 MG tablet Take 2 tablets (100 mg) in a.m., 1 tablet (50 mg) at lunch, and 1 tablet (50 mg) in evening as needed for moderate to severe pain from arthritis and hip fracture. 04/07/23   [name], MD  Vitamin D, Cholecalciferol, 25 MCG (1000 UT) TABS Take 1,000 Units by mouth daily.    [provider]  warfarin (COUMADIN) 5 MG tablet Take 2.5 mg by mouth daily. Every Tuesday, Thursday and Saturday. Take ONE tablet by mouth once daily on Monday, Wednesday Friday and Sunday.    [provider]  Zinc Oxide (TRIPLE PASTE) 12.8 % ointment Apply 1 Application topically. To buttock every shift.    [provider]      Allergies    Sulfa antibiotics, Tape, Codeine, Hydroxychloroquine, Shellfish-derived products, Latex, Neosporin original [bacitracin-neomycin-polymyxin], and Polysporin [bacitracin-polymyxin b]    Review of Systems   Review of Systems  Respiratory:  Positive for shortness of breath.   Neurological:  Positive for weakness.  Physical Exam Updated Vital Signs There were no vitals taken for this visit. Physical Exam Vitals reviewed.  Constitutional:      General: She is not in acute distress. HENT:     Head: Normocephalic.  Cardiovascular:     Rate and Rhythm: Tachycardia present. Rhythm irregular.     Pulses: Normal pulses.  Pulmonary:     Effort: Pulmonary effort is normal. Tachypnea present. No accessory muscle usage.     Breath sounds: Normal breath sounds. No wheezing, rhonchi or rales.  Abdominal:     Palpations: Abdomen is soft.   Skin:    General: Skin is warm and dry.  Neurological:     Mental Status: She is alert.     ED Results / Procedures / Treatments   Labs (all labs ordered are listed, but only abnormal results are displayed) Labs Reviewed  BASIC METABOLIC PANEL  BRAIN NATRIURETIC PEPTIDE  CBC WITH DIFFERENTIAL/PLATELET  PROTIME-INR  TROPONIN I (HIGH SENSITIVITY)    EKG None  Radiology No results found.  Procedures Procedures    Medications Ordered in ED Medications - No data to display  ED Course/ Medical Decision Making/ A&P                                 Medical Decision Making This is an 89 year old female is here today for shortness of breath.  Differential diagnoses include CHF exacerbation, rapid atrial fibrillation, ACS, pneumonia, less likely PE.  Plan -regarding the patient's shortness of breath, may be due to increased rate with her atrial fibrillation.  Rate fluctuated between 105 and 120 in the room for me.  Her last echo is from 2019, and certainly 5 years could have reduced her EF over that time.  Chest x-ray and BNP ordered.  Regarding the patient's intermittent bilateral blurry vision, will obtain CT imaging, although I believe acute intracranial process is less likely.  Patient's vision grossly normal with corrective lenses on at this time.  Cranial nerves II through XII are intact.  No visual field deficits, pupils equal and reactive to light.  Reassessment-my dependent review of the patient's head CT shows no intracranial hemorrhage.  My dependent review of the patient's EKG shows atrial fibrillation with controlled ventricular response.  My dependent review of the patient's chest x-ray shows pulmonary edema.  I reviewed the patient's labs, BNP elevated at 630.  Likely newly developed heart failure.  Will provide IV Lasix, admit to hospitalist.  Amount and/or Complexity of Data Reviewed Labs: ordered. Radiology: ordered.  Risk Prescription drug  management. Decision regarding hospitalization.           Final Clinical Impression(s) / ED Diagnoses Final diagnoses:  None    Rx / DC Orders ED Discharge Orders     None         Arletha Pili, DO 04/22/23 1833

## 2023-04-22 NOTE — Progress Notes (Signed)
ANTICOAGULATION CONSULT NOTE   Pharmacy Consult for warfarin Indication:  hx atrial fibrillation  Allergies  Allergen Reactions   Sulfa Antibiotics Anaphylaxis   Tape Other (See Comments)    BRUISES VERY EASILY!!!!   Codeine Nausea And Vomiting   Hydroxychloroquine     Other reaction(s): GI upset   Shellfish-Derived Products Other (See Comments)    Daughter was unsure of the reaction- "happened years ago" and patient avoids foods that contain shellfish   Latex Rash   Neosporin Original [Bacitracin-Neomycin-Polymyxin] Rash   Polysporin [Bacitracin-Polymyxin B] Rash    Patient Measurements:   Heparin Dosing Weight:   Vital Signs: Temp: 98.4 F (36.9 C) (09/10 1543) Temp Source: Oral (09/10 1543) BP: 157/120 (09/10 1545) Pulse Rate: 114 (09/10 1630)  Labs: Recent Labs    04/22/23 1600 04/22/23 1745  HGB 11.4*  --   HCT 37.6  --   PLT 268  --   LABPROT 22.7*  --   INR 2.0*  --   CREATININE 0.52  --   TROPONINIHS 6 7    CrCl cannot be calculated (Unknown ideal weight.).   Medications:  - PTA warfarin regimen: 2.5mg   daily except 5mg  on Thurs per [facility] clinic note (last dose taken on 04/21/23)  Assessment: Patient is an 89 y.o F with hx afib on warfarin PTA who presented to the ED on 04/22/23 with c/o weakness and SOB. Pharmacy has been consulted to resume warfarin on adm.  Today, 04/22/2023: - INR is therapeutic at 2 - cbc ok   Goal of Therapy:  INR 2-3 Monitor platelets by anticoagulation protocol: Yes   Plan:  - warfarin 2.5 mg PO x1 now - daily INR  Stacey [name] P 04/22/2023,6:53 PM

## 2023-04-22 NOTE — ED Triage Notes (Signed)
Pt bib ems from home with reports of increased weakness and exertional shob onset yesterday. Initial BP 90 palpated. 12 lead shows afib, hx of same. 110/58 on arrival 99% RA CBG 156

## 2023-04-22 NOTE — H&P (Signed)
History and Physical  Stacey [name] ZOX:[id number] DOB: Sep 16, 1932 DOA: 04/22/2023  PCP: [name], MD   Chief Complaint: weakness and exertional dyspnea  HPI: Stacey [name] is a 89 y.o. female with medical history significant for Afib on warfarin, diastolic CHF, RA, GERD, HLD being admitted to the hospital with acute exacerbation of chronic diastolic heart failure. She was on diuretics in the past but this was discontinued by a rehab physican when she was in rehab according to daughter due to concern for potential kidney problems. She was previously on Lasix and Torsemide. She has been short of breath and having exertional dyspnea since yesterday. Denies cough, fevers, chest pain. No recent changes in medications.  ED Course: On evaluation in the ER, vitals are stable on room air. She is in rate controlled Afib. Labs demonstrate Na 134, normal renal function, normal Trop x2 and BNP 636. CXR as below with vascular congestion and moderate pleural effusion.  Review of Systems: Please see HPI for pertinent positives and negatives. A complete 10 system review of systems are otherwise negative.  Past Medical History:  Diagnosis Date   Age-related osteoporosis without current pathological fracture    Per [facility] EMR system, [facility] [employer] [facility]   Atrial fibrillation (HCC)    Chronic diastolic heart failure (HCC)    Per [facility] EMR system, [employer] [facility]   Diaphragmatic hernia without obstruction or gangrene    Per [facility] EMR system, [employer] [facility]   Fracture of left superior rim of pubis with routine healing    Per [facility] EMR system, [employer] [facility]   Gastroesophageal reflux disease    Per [facility] EMR system, [employer] [facility]   Hernia of abdominal cavity    History of fall    Per [facility] EMR system, [employer] [facility]   Hyperlipemia    Per [facility] EMR system, [employer] [facility]   Kyphosis    Polyneuropathy    Per [facility] EMR system, [employer]  Care   RA (rheumatoid arthritis) (HCC)    Per [facility] EMR system, [employer] [facility]   Past Surgical History:  Procedure Laterality Date   ABDOMINAL HYSTERECTOMY     CATARACT EXTRACTION, BILATERAL     TONSILLECTOMY      Social History:  reports that she has quit smoking. She has never used smokeless tobacco. She reports that she does not drink alcohol and does not use drugs.   Allergies  Allergen Reactions   Sulfa Antibiotics Anaphylaxis   Tape Other (See Comments)    BRUISES VERY EASILY!!!!   Codeine Nausea And Vomiting   Hydroxychloroquine     Other reaction(s): GI upset   Shellfish-Derived Products Other (See Comments)    Daughter was unsure of the reaction- "happened years ago" and patient avoids foods that contain shellfish   Latex Rash   Neosporin Original [Bacitracin-Neomycin-Polymyxin] Rash   Polysporin [Bacitracin-Polymyxin B] Rash    Family History  Problem Relation Age of Onset   Healthy Mother    Heart failure Father      Prior to Admission medications   Medication Sig Start Date End Date Taking? Authorizing Provider  acetaminophen (TYLENOL) 500 MG tablet Take 500 mg by mouth 3 (three) times daily.    [provider]  atorvastatin (LIPITOR) 10 MG tablet TAKE 1 TABLET DAILY 03/05/23   [name], MD  cyanocobalamin 1000 MCG tablet Take 1,000 mcg by mouth daily.  [provider]  fexofenadine (ALLEGRA) 60 MG tablet TAKE 1 TABLET DAILY AS NEEDED FOR ALLERGIES OR RHINITIS 04/02/23   [name], MD  gabapentin (NEURONTIN) 400 MG capsule TAKE 1 CAPSULE DAILY 08/22/22   [name], MD  meclizine (ANTIVERT) 12.5 MG tablet Take 1 tablet (12.5 mg total) by mouth 3 (three) times daily as needed for dizziness. 06/08/21   [name], MD  metoprolol tartrate (LOPRESSOR) 50 MG tablet TAKE 1 TABLET TWICE A DAY 05/06/22   [name], MD  Multiple Vitamin (MULTIVITAMIN) capsule Take 1 capsule by mouth daily.    [provider]  omeprazole (PRILOSEC) 20 MG capsule Take 20 mg by mouth daily.    [provider]  predniSONE (DELTASONE) 5 MG tablet Take 5 mg by mouth daily.    [provider]  sertraline (ZOLOFT) 100 MG tablet TAKE 1 TABLET DAILY 04/08/22   [name], MD  traMADol (ULTRAM) 50 MG tablet Take 2 tablets (100 mg) in a.m., 1 tablet (50 mg) at lunch, and 1 tablet (50 mg) in evening as needed for moderate to severe pain from arthritis and hip fracture. 04/07/23   [name], MD  Vitamin D, Cholecalciferol, 25 MCG (1000 UT) TABS Take 1,000 Units by mouth daily.    [provider]  warfarin (COUMADIN) 5 MG tablet Take 2.5 mg by mouth daily. Every Tuesday, Thursday and Saturday. Take ONE tablet by mouth once daily on Monday, Wednesday Friday and Sunday.    [provider]  Zinc Oxide (TRIPLE PASTE) 12.8 % ointment Apply 1 Application topically. To buttock every shift.    [provider]    Physical Exam: BP (!) 157/120   Pulse (!) 114   Temp 98.4 F (36.9 C) (Oral)   Resp (!) 21   SpO2 96%   General:  Alert, oriented, calm, in no acute distress, daughter on speakerphone participating in conversation Thin elderly female who appears her stated age.  Eyes: EOMI, clear conjuctivae, white sclerea Neck: supple, no masses, trachea mildline  Cardiovascular: irregularly irregular pulse 60s, no murmurs or rubs, she has 1+ pitting BLE edema  Respiratory: breath sounds diminished at the right base, no wheezes, no crackles  Abdomen: soft, nontender, nondistended, normal bowel tones heard  Skin: dry, no rashes  Musculoskeletal: no joint effusions, normal range of motion  Psychiatric: appropriate affect, normal speech  Neurologic: extraocular muscles intact, clear speech, moving all extremities with intact sensorium          Labs on Admission:  Basic Metabolic Panel: Recent Labs  Lab 04/22/23 1600  NA 134*  K 3.9  CL 101  CO2 24  GLUCOSE 111*  BUN 12   CREATININE 0.52  CALCIUM 8.8*   Liver Function Tests: No results for input(s): "AST", "ALT", "ALKPHOS", "BILITOT", "PROT", "ALBUMIN" in the last 168 hours. No results for input(s): "LIPASE", "AMYLASE" in the last 168 hours. No results for input(s): "AMMONIA" in the last 168 hours. CBC: Recent Labs  Lab 04/22/23 1600  WBC 7.5  NEUTROABS 6.6  HGB 11.4*  HCT 37.6  MCV 97.4  PLT 268   Cardiac Enzymes: No results for input(s): "CKTOTAL", "CKMB", "CKMBINDEX", "TROPONINI" in the last 168 hours.  BNP (last 3 results) Recent Labs    04/22/23 1600  BNP 636.3*    ProBNP (last 3 results) No results for input(s): "PROBNP" in the last 8760 hours.  CBG: No results for input(s): "GLUCAP" in the last 168 hours.  Radiological Exams on Admission: DG Chest Portable 1 View  Result Date: 04/22/2023 CLINICAL DATA:  Dyspnea. Exertional shortness of breath since yesterday. EXAM: PORTABLE CHEST 1 VIEW COMPARISON:  03/13/2023 FINDINGS: Marked patient rotation to the left. No gross change in enlargement of the cardiac silhouette. Interval moderate-sized left pleural effusion and small right pleural effusion. Mild increase in prominence of the interstitial markings, including Kerley lines. Left basilar atelectasis or pneumonia. Diffuse osteopenia. Diffuse osteopenia. Superior migration of the left humeral head. IMPRESSION: 1. Interval development of mild congestive heart failure. 2. Interval development of a moderate-sized left pleural effusion and small right pleural effusion. 3. Left basilar atelectasis or pneumonia. 4. Chronic left rotator cuff tear. Electronically Signed   By: Beckie Salts M.D.   On: 04/22/2023 17:34   CT Head Wo Contrast  Result Date: 04/22/2023 CLINICAL DATA:  Weakness and vision impairment. EXAM: CT HEAD WITHOUT CONTRAST TECHNIQUE: Contiguous axial images were obtained from the base of the skull through the vertex without intravenous contrast. RADIATION DOSE REDUCTION: This exam  was performed according to the departmental dose-optimization program which includes automated exposure control, adjustment of the mA and/or kV according to patient size and/or use of iterative reconstruction technique. COMPARISON:  Head CT dated 03/13/2023. FINDINGS: Brain: Mild age-related atrophy and chronic microvascular ischemic changes. There is no acute intracranial hemorrhage. No mass effect or midline shift. No extra-axial fluid collection. Vascular: No hyperdense vessel or unexpected calcification. Skull: Normal. Negative for fracture or focal lesion. Sinuses/Orbits: No acute finding. Other: None IMPRESSION: 1. No acute intracranial pathology. 2. Mild age-related atrophy and chronic microvascular ischemic changes. Electronically Signed   By: Elgie Collard M.D.   On: 04/22/2023 17:20    Assessment/Plan Tyease Shillingford is a 89 y.o. female with medical history significant for Afib on warfarin, diastolic CHF, RA, GERD, HLD being admitted to the hospital with acute exacerbation of chronic diastolic heart failure.  Acute diastolic CHF - with elevated BNP, dyspnea and LE edema as well as pleural effusion - inpatient admission - telemetry - lasix 40mg  IV daily - initiate low dose ACE - TTE - consider cardiology consult if not improving, otherwise likely can f/u outpatient  HLD - Lipitor  Persistent AFib - rate controlled. Therapeutic INR. - rate control with metoprolol - warfarin per pharmacy consult  Moderate pleural effusion - stable on room air, presumably due to CHF being treated as above  RA - continue chronic prednisone 5mg  po daily  Depression - Zoloft  DVT prophylaxis: Lovenox     Code Status: Do not attempt resuscitation (DNR) PRE-ARREST INTERVENTIONS DESIRED  Consults called: None  Admission status: Inpatient  Time spent: 54 minutes  Auren Valdes Sharlette Dense MD [facility] Pager [phone]  If 7PM-7AM, please contact night-coverage [email or web address] Password  [facility]  04/22/2023, 6:31 PM

## 2023-04-23 ENCOUNTER — Inpatient Hospital Stay (HOSPITAL_COMMUNITY): Payer: Medicare Other

## 2023-04-23 ENCOUNTER — Encounter (HOSPITAL_COMMUNITY): Payer: Self-pay | Admitting: Internal Medicine

## 2023-04-23 ENCOUNTER — Other Ambulatory Visit: Payer: Self-pay

## 2023-04-23 DIAGNOSIS — I482 Chronic atrial fibrillation, unspecified: Secondary | ICD-10-CM | POA: Diagnosis not present

## 2023-04-23 DIAGNOSIS — I739 Peripheral vascular disease, unspecified: Secondary | ICD-10-CM | POA: Diagnosis not present

## 2023-04-23 DIAGNOSIS — R441 Visual hallucinations: Secondary | ICD-10-CM | POA: Diagnosis present

## 2023-04-23 DIAGNOSIS — I5031 Acute diastolic (congestive) heart failure: Secondary | ICD-10-CM

## 2023-04-23 DIAGNOSIS — J810 Acute pulmonary edema: Secondary | ICD-10-CM

## 2023-04-23 LAB — TSH: TSH: 1.214 u[IU]/mL (ref 0.350–4.500)

## 2023-04-23 LAB — CBC
HCT: 36.8 % (ref 36.0–46.0)
Hemoglobin: 11.3 g/dL — ABNORMAL LOW (ref 12.0–15.0)
MCH: 28.8 pg (ref 26.0–34.0)
MCHC: 30.7 g/dL (ref 30.0–36.0)
MCV: 93.9 fL (ref 80.0–100.0)
Platelets: 250 10*3/uL (ref 150–400)
RBC: 3.92 MIL/uL (ref 3.87–5.11)
RDW: 15.2 % (ref 11.5–15.5)
WBC: 8.2 10*3/uL (ref 4.0–10.5)
nRBC: 0 % (ref 0.0–0.2)

## 2023-04-23 LAB — ECHOCARDIOGRAM COMPLETE
Area-P 1/2: 6.54 cm2
Height: 63 in
MV M vel: 4.9 m/s
MV Peak grad: 95.8 mmHg
Radius: 0.3 cm
S' Lateral: 2.1 cm
Weight: 1502.66 [oz_av]

## 2023-04-23 LAB — URINALYSIS, W/ REFLEX TO CULTURE (INFECTION SUSPECTED)
Bilirubin Urine: NEGATIVE
Glucose, UA: NEGATIVE mg/dL
Hgb urine dipstick: NEGATIVE
Ketones, ur: NEGATIVE mg/dL
Leukocytes,Ua: NEGATIVE
Nitrite: NEGATIVE
Protein, ur: NEGATIVE mg/dL
Specific Gravity, Urine: 1.005 (ref 1.005–1.030)
pH: 8 (ref 5.0–8.0)

## 2023-04-23 LAB — MAGNESIUM: Magnesium: 1.8 mg/dL (ref 1.7–2.4)

## 2023-04-23 LAB — BASIC METABOLIC PANEL
Anion gap: 11 (ref 5–15)
BUN: 10 mg/dL (ref 8–23)
CO2: 27 mmol/L (ref 22–32)
Calcium: 8.8 mg/dL — ABNORMAL LOW (ref 8.9–10.3)
Chloride: 96 mmol/L — ABNORMAL LOW (ref 98–111)
Creatinine, Ser: 0.47 mg/dL (ref 0.44–1.00)
GFR, Estimated: >60 mL/min (ref >60)
Glucose, Bld: 124 mg/dL — ABNORMAL HIGH (ref 70–99)
Potassium: 2.9 mmol/L — ABNORMAL LOW (ref 3.5–5.1)
Sodium: 134 mmol/L — ABNORMAL LOW (ref 135–145)

## 2023-04-23 LAB — PROTIME-INR
INR: 2.1 — ABNORMAL HIGH (ref 0.8–1.2)
Prothrombin Time: 23.5 s — ABNORMAL HIGH (ref 11.4–15.2)

## 2023-04-23 MED ORDER — WARFARIN SODIUM 5 MG PO TABS
5.0000 mg | ORAL_TABLET | ORAL | Status: DC
  Administered 2023-04-24: 5 mg via ORAL
  Filled 2023-04-23: qty 1

## 2023-04-23 MED ORDER — POTASSIUM CHLORIDE CRYS ER 20 MEQ PO TBCR
40.0000 meq | EXTENDED_RELEASE_TABLET | ORAL | Status: AC
  Administered 2023-04-23 (×2): 40 meq via ORAL
  Filled 2023-04-23 (×2): qty 2

## 2023-04-23 MED ORDER — SPIRONOLACTONE 12.5 MG HALF TABLET
12.5000 mg | ORAL_TABLET | Freq: Every day | ORAL | Status: DC
  Administered 2023-04-23 – 2023-04-24 (×2): 12.5 mg via ORAL
  Filled 2023-04-23 (×2): qty 1

## 2023-04-23 MED ORDER — WARFARIN SODIUM 2.5 MG PO TABS
2.5000 mg | ORAL_TABLET | ORAL | Status: DC
  Administered 2023-04-23 – 2023-04-30 (×7): 2.5 mg via ORAL
  Filled 2023-04-23 (×8): qty 1

## 2023-04-23 NOTE — Plan of Care (Signed)
  Problem: Education: Goal: Knowledge of General Education information will improve Description: Including pain rating scale, medication(s)/side effects and non-pharmacologic comfort measures Outcome: Progressing   Problem: Health Behavior/Discharge Planning: Goal: Ability to manage health-related needs will improve Outcome: Progressing   Problem: Clinical Measurements: Goal: Respiratory complications will improve Outcome: Progressing   Problem: Nutrition: Goal: Adequate nutrition will be maintained Outcome: Progressing   Problem: Coping: Goal: Level of anxiety will decrease Outcome: Progressing   

## 2023-04-23 NOTE — Progress Notes (Signed)
[facility]                                                                              Stacey [name], is a 89 y.o. female, DOB - [birth year], PNT:[id number] Admit date - 04/22/2023    Outpatient Primary MD for the patient is [name], MD  LOS - 1  days  Chief Complaint  Patient presents with   Weakness   Shortness of Breath       Brief summary   Patient is a 89 year old female with A-fib on warfarin, diastolic CHF, RA, GERD, HLD presented with exertional dyspnea, weakness.  She was on diuretics in the past however per her daughter, was discontinued when she was in the rehab due to potential kidney problems.  She has been on Lasix and torsemide.  Patient was noted to be short of breath and having exertional dyspnea, no cough, fevers, chest pain. Sodium 134, normal renal function, BNP 636, chest x-ray with moderate pleural effusions and vascular congestion.  Patient was admitted for further workup.   Assessment & Plan    Principal Problem:   Acute diastolic (congestive) heart failure (HCC) Moderate pleural effusions -Presented with dyspnea, lower extremity edema, pleural effusions, elevated BNP -Continue Lasix 40 mg IV daily, good output with negative balance of 3.5 L -Cardiology consulted, follow 2D echo -Follows Dr. Wyline Mood outpatient  Active Problems:    Chronic atrial fibrillation (HCC), persistent - rate controlled, continue metoprolol -Patient has been on warfarin, will continue  Mild chronic hyponatremia -Has chronic hyponatremia, sodium 132 on 03/16/2023 -Currently stable, 134 at baseline  Hypokalemia Potassium 2.9 Replaced.  Magnesium 1.8    PVD (peripheral vascular disease) (HCC) -Continue statin, warfarin, has been seen by [facility]     Hallucinations, visual -Has been ongoing for over a month, with visual hallucinations -Has outpatient ophthalmology appointment -Obtain MRI brain, rule out any intracranial abnormality, UA to rule out  UTI -Obtain B12, folate, B1, TSH normal 1.2   Underweight Estimated body mass index is 16.64 kg/m as calculated from the following:   Height as of this encounter: 5\' 3"  (1.6 m).   Weight as of this encounter: 42.6 kg.  Code Status: DNR DVT Prophylaxis:   warfarin (COUMADIN) tablet 2.5 mg  warfarin (COUMADIN) tablet 5 mg   Level of Care: Level of care: Telemetry Family Communication: Updated patient Disposition Plan:      Remains inpatient appropriate: On IV Lasix   Procedures:  None  Consultants:   Cardiology  Antimicrobials:   Anti-infectives (From admission, onward)    None          Medications  atorvastatin  10 mg Oral Daily   furosemide  40 mg Intravenous Daily   gabapentin  400 mg Oral Daily   lisinopril  5 mg Oral Daily   metoprolol tartrate  50 mg Oral BID   pantoprazole  40 mg Oral Daily   potassium chloride  40 mEq Oral Q4H   predniSONE  5 mg Oral Q breakfast   sertraline  100 mg Oral Daily   sodium chloride flush  3 mL Intravenous Q12H   spironolactone  12.5  mg Oral Daily   warfarin  2.5 mg Oral Once per day on Sunday Monday Tuesday Wednesday Friday Saturday   [START ON 04/24/2023] warfarin  5 mg Oral Every Thursday   Warfarin - Pharmacist Dosing Inpatient   Does not apply q1600      Subjective:   Stacey [name] was seen and examined today.  States still feeling short of breath with swelling in her lower legs, intermittent visual hallucinations.  No nausea vomiting, abdominal pain, diarrhea No acute events overnight.    Objective:   Vitals:   04/23/23 0514 04/23/23 0637 04/23/23 0638 04/23/23 1009  BP:   (!) 154/103 126/64  Pulse:   94 77  Resp:   20   Temp: 97.6 F (36.4 C)  97.6 F (36.4 C) 98.3 F (36.8 C)  TempSrc: Axillary  Oral Oral  SpO2:   99% 95%  Weight:  42.6 kg    Height:  5\' 3"  (1.6 m)      Intake/Output Summary (Last 24 hours) at 04/23/2023 1301 Last data filed at 04/23/2023 1212 Gross per 24 hour  Intake --   Output 3500 ml  Net -3500 ml     Wt Readings from Last 3 Encounters:  04/23/23 42.6 kg  04/07/23 40.4 kg  03/28/23 40.5 kg     Exam General: Alert and oriented x 3, NAD Cardiovascular: S1 S2 auscultated,  RRR Respiratory: Decreased breath sound at the bases Gastrointestinal: Soft, nontender, nondistended, + bowel sounds Ext: 1+ pedal edema bilaterally Neuro: Strength 5/5 upper and lower extremities bilaterally Psych: Normal affect     Data Reviewed:  I have personally reviewed following labs    CBC Lab Results  Component Value Date   WBC 8.2 04/23/2023   RBC 3.92 04/23/2023   HGB 11.3 (L) 04/23/2023   HCT 36.8 04/23/2023   MCV 93.9 04/23/2023   MCH 28.8 04/23/2023   PLT 250 04/23/2023   MCHC 30.7 04/23/2023   RDW 15.2 04/23/2023   LYMPHSABS 0.3 (L) 04/22/2023   MONOABS 0.4 04/22/2023   EOSABS 0.0 04/22/2023   BASOSABS 0.0 04/22/2023     Last metabolic panel Lab Results  Component Value Date   NA 134 (L) 04/23/2023   K 2.9 (L) 04/23/2023   CL 96 (L) 04/23/2023   CO2 27 04/23/2023   BUN 10 04/23/2023   CREATININE 0.47 04/23/2023   GLUCOSE 124 (H) 04/23/2023   GFRNONAA >60 04/23/2023   GFRAA 64 11/24/2019   CALCIUM 8.8 (L) 04/23/2023   PROT 6.0 (L) 03/13/2023   ALBUMIN 3.2 (L) 03/13/2023   BILITOT 1.3 (H) 03/13/2023   ALKPHOS 72 03/13/2023   AST 34 03/13/2023   ALT 24 03/13/2023   ANIONGAP 11 04/23/2023    CBG (last 3)  No results for input(s): "GLUCAP" in the last 72 hours.    Coagulation Profile: Recent Labs  Lab 04/22/23 1600 04/23/23 0729  INR 2.0* 2.1*     Radiology Studies: I have personally reviewed the imaging studies  DG Chest Portable 1 View  Result Date: 04/22/2023 CLINICAL DATA:  Dyspnea. Exertional shortness of breath since yesterday. EXAM: PORTABLE CHEST 1 VIEW COMPARISON:  03/13/2023 FINDINGS: Marked patient rotation to the left. No gross change in enlargement of the cardiac silhouette. Interval moderate-sized left  pleural effusion and small right pleural effusion. Mild increase in prominence of the interstitial markings, including Kerley lines. Left basilar atelectasis or pneumonia. Diffuse osteopenia. Diffuse osteopenia. Superior migration of the left humeral head. IMPRESSION: 1. Interval development of mild  congestive heart failure. 2. Interval development of a moderate-sized left pleural effusion and small right pleural effusion. 3. Left basilar atelectasis or pneumonia. 4. Chronic left rotator cuff tear. Electronically Signed   By: Beckie Salts M.D.   On: 04/22/2023 17:34   CT Head Wo Contrast  Result Date: 04/22/2023 CLINICAL DATA:  Weakness and vision impairment. EXAM: CT HEAD WITHOUT CONTRAST TECHNIQUE: Contiguous axial images were obtained from the base of the skull through the vertex without intravenous contrast. RADIATION DOSE REDUCTION: This exam was performed according to the departmental dose-optimization program which includes automated exposure control, adjustment of the mA and/or kV according to patient size and/or use of iterative reconstruction technique. COMPARISON:  Head CT dated 03/13/2023. FINDINGS: Brain: Mild age-related atrophy and chronic microvascular ischemic changes. There is no acute intracranial hemorrhage. No mass effect or midline shift. No extra-axial fluid collection. Vascular: No hyperdense vessel or unexpected calcification. Skull: Normal. Negative for fracture or focal lesion. Sinuses/Orbits: No acute finding. Other: None IMPRESSION: 1. No acute intracranial pathology. 2. Mild age-related atrophy and chronic microvascular ischemic changes. Electronically Signed   By: Elgie Collard M.D.   On: 04/22/2023 17:20       Mardella Nuckles M.D. [facility] 04/23/2023, 1:01 PM  Available via Epic secure chat 7am-7pm After 7 pm, please refer to night coverage provider listed on amion.

## 2023-04-23 NOTE — Progress Notes (Signed)
ANTICOAGULATION CONSULT NOTE - Follow Up Consult  Pharmacy Consult for Coumadin Indication: atrial fibrillation  Allergies  Allergen Reactions   Sulfa Antibiotics Anaphylaxis   Tape Other (See Comments)    BRUISES VERY EASILY!!!!   Codeine Nausea And Vomiting   Hydroxychloroquine Other (See Comments)    GI upset   Shellfish-Derived Products Other (See Comments)    Daughter was unsure of the reaction- "happened years ago" and patient avoids foods that contain shellfish   Latex Rash   Neosporin Original [Bacitracin-Neomycin-Polymyxin] Rash   Polysporin [Bacitracin-Polymyxin B] Rash    Patient Measurements: Height: 5\' 3"  (160 cm) Weight: 42.6 kg (93 lb 14.7 oz) IBW/kg (Calculated) : 52.4  Vital Signs: Temp: 97.6 F (36.4 C) (09/11 0638) Temp Source: Oral (09/11 0638) BP: 154/103 (09/11 0981) Pulse Rate: 94 (09/11 0638)  Labs: Recent Labs    04/22/23 1600 04/22/23 1745 04/23/23 0729  HGB 11.4*  --  11.3*  HCT 37.6  --  36.8  PLT 268  --  250  LABPROT 22.7*  --  23.5*  INR 2.0*  --  2.1*  CREATININE 0.52  --  0.47  TROPONINIHS 6 7  --     Estimated Creatinine Clearance: 32.7 mL/min (by C-G formula based on SCr of 0.47 mg/dL).   Assessment:  AC/Heme: hx afib on warfarin PTA. - Hgb 11.3 stable. Plts WNL. INR 2.1 in gaol   - PTA warfarin dose: 2.5mg   daily except 5mg  on Thurs per [facility] clinic note  Goal of Therapy:  INR 2-3 Monitor platelets by anticoagulation protocol: Yes   Plan:  Con't home regimen DAily INR   Quayshaun Hubbert S. Merilynn Finland, PharmD, BCPS Clinical Staff Pharmacist [email or web address] Merilynn Finland, Purcell Jungbluth Stillinger 04/23/2023,8:45 AM

## 2023-04-23 NOTE — ED Notes (Signed)
.ED TO INPATIENT HANDOFF REPORT  Name/Age/Gender Stacey [name] 89 y.o. female  Code Status    Code Status Orders  (From admission, onward)           Start     Ordered   04/22/23 1830  Do not attempt resuscitation (DNR) Pre-Arrest Interventions Desired  Continuous       Question Answer Comment  If pulseless and not breathing No CPR or chest compressions.   In Pre-Arrest Conditions (Patient Has Pulse and Is Breathing) May intubate, use advanced airway interventions and cardioversion/ACLS medications if appropriate or indicated. May transfer to ICU.   Consent: Discussion documented in EHR or advanced directives reviewed      04/22/23 1831           Code Status History     Date Active Date Inactive Code Status Order ID Comments User Context   03/28/2023 1620 04/22/2023 1513 DNR [id number]  Earnestine Mealing, MD Outpatient   03/13/2023 2047 03/18/2023 1854 Full Code [id number]  Hillary Bow, DO ED       Home/SNF/Other Home  Chief Complaint Acute diastolic (congestive) heart failure (HCC) [I50.31]  Level of Care/Admitting Diagnosis ED Disposition     ED Disposition  Admit   Condition  --   Comment  Hospital [facility]: [facility] [[id number]] Level of Care: Telemetry [5] Admit to tele based on following criteria: Acute CHF May admit patient to [facility] or [facility] if equivalent level of care is available:: Yes Covid Evaluatio n: Asymptomatic - no recent exposure (last 10 days) testing not required Diagnosis: Acute diastolic (congestive) heart failure [facility]) [[id number]] Admitting Physician: Maryln Gottron [[id number]] Attending Physician: Kirby Crigler, MIR M [[id number]] Certi fication:: I certify this patient will need inpatient services for at least 2 midnights Expected Medical Readiness: 04/24/2023          Medical History Past Medical History:  Diagnosis Date   Age-related osteoporosis without current pathological fracture    Per [facility] EMR system, [employer] [facility]   Atrial fibrillation (HCC)    Chronic diastolic heart failure (HCC)    Per [facility] EMR system, [employer] [facility]   Diaphragmatic hernia without obstruction or gangrene    Per [facility] EMR system, [employer] [facility]   Fracture of left superior rim of pubis with routine healing    Per [facility] EMR system, [employer] [facility]   Gastroesophageal reflux disease    Per [facility] EMR system, [employer] [facility]   Hernia of abdominal cavity    History of fall    Per [facility] EMR system, [employer] [facility]   Hyperlipemia    Per [facility] EMR system, [employer] [facility]   Kyphosis    Polyneuropathy    Per [facility] EMR system, [employer] [facility]   RA (rheumatoid arthritis) (HCC)    Per [facility] EMR system, [employer] [facility]    Allergies Allergies  Allergen Reactions   Sulfa Antibiotics Anaphylaxis   Tape Other (See Comments)    BRUISES VERY EASILY!!!!   Codeine Nausea And Vomiting   Hydroxychloroquine Other (See Comments)    GI upset   Shellfish-Derived Products Other (See Comments)    Daughter was unsure of the reaction- "happened years ago" and patient avoids foods that contain shellfish   Latex Rash   Neosporin Original [Bacitracin-Neomycin-Polymyxin] Rash   Polysporin [Bacitracin-Polymyxin B] Rash    IV Location/Drains/Wounds Patient Lines/Drains/Airways Status     Active  Line/Drains/Airways     Name Placement date Placement time Site Days   Peripheral IV 04/22/23 20 G Anterior;Left Forearm 04/22/23  1546  Forearm  1            Labs/Imaging Results for orders placed or performed during the hospital encounter of 04/22/23 (from the past 48 hour(s))  Basic metabolic panel     Status: Abnormal   Collection Time: 04/22/23  4:00 PM  Result Value Ref Range   Sodium 134 (L) 135 - 145 mmol/L   Potassium 3.9 3.5 - 5.1 mmol/L   Chloride 101 98 - 111 mmol/L   CO2 24 22 - 32 mmol/L   Glucose, Bld 111 (H) 70 - 99 mg/dL    Comment: Glucose  reference range applies only to samples taken after fasting for at least 8 hours.   BUN 12 8 - 23 mg/dL   Creatinine, Ser 1.61 0.44 - 1.00 mg/dL   Calcium 8.8 (L) 8.9 - 10.3 mg/dL   GFR, Estimated >09 >60 mL/min    Comment: (NOTE) Calculated using the CKD-EPI Creatinine Equation (2021)    Anion gap 9 5 - 15    Comment: Performed at [facility], [address] [place], [place] [address]  Brain natriuretic peptide     Status: Abnormal   Collection Time: 04/22/23  4:00 PM  Result Value Ref Range   B Natriuretic Peptide 636.3 (H) 0.0 - 100.0 pg/mL    Comment: Performed at [facility], [address] [place], [place] [address]  Troponin I (High Sensitivity)     Status: None   Collection Time: 04/22/23  4:00 PM  Result Value Ref Range   Troponin I (High Sensitivity) 6 <18 ng/L    Comment: (NOTE) Elevated high sensitivity troponin I (hsTnI) values and significant  changes across serial measurements may suggest ACS but many other  chronic and acute conditions are known to elevate hsTnI results.  Refer to the "Links" section for chest pain algorithms and additional  guidance. Performed at [facility], [address], [place], [place] [address]   CBC with Differential     Status: Abnormal   Collection Time: 04/22/23  4:00 PM  Result Value Ref Range   WBC 7.5 4.0 - 10.5 K/uL   RBC 3.86 (L) 3.87 - 5.11 MIL/uL   Hemoglobin 11.4 (L) 12.0 - 15.0 g/dL   HCT 56.2 13.0 - 86.5 %   MCV 97.4 80.0 - 100.0 fL   MCH 29.5 26.0 - 34.0 pg   MCHC 30.3 30.0 - 36.0 g/dL   RDW 78.4 69.6 - 29.5 %   Platelets 268 150 - 400 K/uL   nRBC 0.0 0.0 - 0.2 %   Neutrophils Relative % 89 %   Neutro Abs 6.6 1.7 - 7.7 K/uL   Lymphocytes Relative 4 %   Lymphs Abs 0.3 (L) 0.7 - 4.0 K/uL   Monocytes Relative 6 %   Monocytes Absolute 0.4 0.1 - 1.0 K/uL   Eosinophils Relative 0 %   Eosinophils Absolute 0.0 0.0 - 0.5 K/uL   Basophils Relative 1 %    Basophils Absolute 0.0 0.0 - 0.1 K/uL   Immature Granulocytes 0 %   Abs Immature Granulocytes 0.03 0.00 - 0.07 K/uL    Comment: Performed at [facility], [address], [place], [place] [address]  Protime-INR     Status: Abnormal   Collection Time: 04/22/23  4:00 PM  Result Value Ref Range  Prothrombin Time 22.7 (H) 11.4 - 15.2 seconds   INR 2.0 (H) 0.8 - 1.2    Comment: (NOTE) INR goal varies based on device and disease states. Performed at [facility], [address], [place], [place] [address]   Troponin I (High Sensitivity)     Status: None   Collection Time: 04/22/23  5:45 PM  Result Value Ref Range   Troponin I (High Sensitivity) 7 <18 ng/L    Comment: (NOTE) Elevated high sensitivity troponin I (hsTnI) values and significant  changes across serial measurements may suggest ACS but many other  chronic and acute conditions are known to elevate hsTnI results.  Refer to the "Links" section for chest pain algorithms and additional  guidance. Performed at [facility], [address], [place], [place] [address]    DG Chest Portable 1 View  Result Date: 04/22/2023 CLINICAL DATA:  Dyspnea. Exertional shortness of breath since yesterday. EXAM: PORTABLE CHEST 1 VIEW COMPARISON:  03/13/2023 FINDINGS: Marked patient rotation to the left. No gross change in enlargement of the cardiac silhouette. Interval moderate-sized left pleural effusion and small right pleural effusion. Mild increase in prominence of the interstitial markings, including Kerley lines. Left basilar atelectasis or pneumonia. Diffuse osteopenia. Diffuse osteopenia. Superior migration of the left humeral head. IMPRESSION: 1. Interval development of mild congestive heart failure. 2. Interval development of a moderate-sized left pleural effusion and small right pleural effusion. 3. Left basilar atelectasis or pneumonia. 4. Chronic left rotator cuff tear. Electronically  Signed   By: Beckie Salts M.D.   On: 04/22/2023 17:34   CT Head Wo Contrast  Result Date: 04/22/2023 CLINICAL DATA:  Weakness and vision impairment. EXAM: CT HEAD WITHOUT CONTRAST TECHNIQUE: Contiguous axial images were obtained from the base of the skull through the vertex without intravenous contrast. RADIATION DOSE REDUCTION: This exam was performed according to the departmental dose-optimization program which includes automated exposure control, adjustment of the mA and/or kV according to patient size and/or use of iterative reconstruction technique. COMPARISON:  Head CT dated 03/13/2023. FINDINGS: Brain: Mild age-related atrophy and chronic microvascular ischemic changes. There is no acute intracranial hemorrhage. No mass effect or midline shift. No extra-axial fluid collection. Vascular: No hyperdense vessel or unexpected calcification. Skull: Normal. Negative for fracture or focal lesion. Sinuses/Orbits: No acute finding. Other: None IMPRESSION: 1. No acute intracranial pathology. 2. Mild age-related atrophy and chronic microvascular ischemic changes. Electronically Signed   By: Elgie Collard M.D.   On: 04/22/2023 17:20    Pending Labs Unresulted Labs (From admission, onward)     Start     Ordered   04/23/23 0500  Basic metabolic panel  Daily,   R     Comments: As Scheduled for 5 days    04/22/23 1831   04/23/23 0500  CBC  Daily,   R      04/22/23 1831   04/23/23 0500  Protime-INR  Daily,   R      04/22/23 1904   04/22/23 1837  TSH  Add-on,   AD        04/22/23 1836            Vitals/Pain Today's Vitals   04/23/23 0200 04/23/23 0415 04/23/23 0430 04/23/23 0514  BP: (!) 161/84 (!) 159/76 (!) 153/92   Pulse: 80 75 (!) 147   Resp: (!) 27 (!) 26 (!) 30   Temp:    97.6 F (36.4 C)  TempSrc:    Axillary  SpO2: 90%  95% 93%   PainSc:        Isolation Precautions No active isolations  Medications Medications  atorvastatin (LIPITOR) tablet 10 mg (10 mg Oral Given 04/22/23  2045)  metoprolol tartrate (LOPRESSOR) tablet 50 mg (50 mg Oral Given 04/22/23 2129)  sertraline (ZOLOFT) tablet 100 mg (has no administration in time range)  gabapentin (NEURONTIN) capsule 400 mg (400 mg Oral Given 04/22/23 2045)  sodium chloride flush (NS) 0.9 % injection 3 mL (3 mLs Intravenous Given 04/22/23 2135)  sodium chloride flush (NS) 0.9 % injection 3 mL (has no administration in time range)  0.9 %  sodium chloride infusion (has no administration in time range)  acetaminophen (TYLENOL) tablet 650 mg (has no administration in time range)  ondansetron (ZOFRAN) injection 4 mg (has no administration in time range)  furosemide (LASIX) injection 40 mg (has no administration in time range)  lisinopril (ZESTRIL) tablet 5 mg (has no administration in time range)  Warfarin - Pharmacist Dosing Inpatient (has no administration in time range)  predniSONE (DELTASONE) tablet 5 mg (has no administration in time range)  pantoprazole (PROTONIX) EC tablet 40 mg (has no administration in time range)  traMADol (ULTRAM) tablet 50 mg (50 mg Oral Given 04/22/23 2129)  hydrALAZINE (APRESOLINE) injection 5 mg (5 mg Intravenous Given 04/23/23 0326)  furosemide (LASIX) injection 40 mg (40 mg Intravenous Given 04/22/23 1757)  warfarin (COUMADIN) tablet 2.5 mg (2.5 mg Oral Given 04/22/23 2045)    Mobility walks with device

## 2023-04-23 NOTE — Consult Note (Signed)
Cardiology Consult:   Patient ID: Stacey [name]; MRN: [id number]; DOB: [birth year]   Admission date: 04/22/2023  Primary Care Provider: [name], MD Primary Cardiologist: Dr. Carolan Clines  Consult question (Dr. Isidoro Donning):  HF mgmt  Patient Profile:   Stacey [name] is a 89 y.o. female with history of LSP AF on coumadin (GI bleed with Xarelto), with chronic HFpEF and bilateral subclavian stenosis. Established with Dr. Katrinka Blazing 01/2023.  She has prominent kyphoscoliosis and is limited at baseline.   History of Present Illness:   Stacey [name] is feeling poorly. Patient notes that she broke her hip recently. Her daugther had broken a hip and needed rest, but she was given rehab. As part of her evaluation she had her torsemide 40 mg, K, and MRA 12.5 dosing held as she was asymptomatic from HFpEF. After this she began to have No shortness of breath, DOE.  While she never lays completely flat, she had orthopnea.  She tried 3 days of diuretic therapy from her previous medications from Dr. Katrinka Blazing and her sx resolved.  Also lead to increased urinary frequency.  She needs her daugther, Stacey [name], to assist in events.  Ultimately this was stopped.   PTA, EMS was called.  She was brought in for evaluation  Patient reports prior cardiac testing including : Found to have prominent hypokalemia this AM with K 2.9.  Elevated BNP 639 on admission yesterday  Stable normocytic anemia.  She has ABI testing from 2022 with abnormal TBIs.  An echo was performed in 2019 but is unable for review.  Normal biventricular function is reported with left atrial dilation and aortic and mitral valve calcification.  She has received 40 IV lasix for intervention with brisk response; she is planned to received K supplementation today (80 meq) Her home lisinopril has been restarted and she is on metoprolol 50 mg PO BID. Received AM hydralazine for hypotension.  Allergies:    Allergies  Allergen Reactions   Sulfa  Antibiotics Anaphylaxis   Tape Other (See Comments)    BRUISES VERY EASILY!!!!   Codeine Nausea And Vomiting   Hydroxychloroquine Other (See Comments)    GI upset   Shellfish-Derived Products Other (See Comments)    Daughter was unsure of the reaction- "happened years ago" and patient avoids foods that contain shellfish   Latex Rash   Neosporin Original [Bacitracin-Neomycin-Polymyxin] Rash   Polysporin [Bacitracin-Polymyxin B] Rash    Social History:   Social History   Socioeconomic History   Marital status: Widowed    Spouse name: Not on file   Number of children: Not on file   Years of education: Not on file   Highest education level: Not on file  Occupational History   Not on file  Tobacco Use   Smoking status: Former   Smokeless tobacco: Never  Vaping Use   Vaping status: Never Used  Substance and Sexual Activity   Alcohol use: No   Drug use: No   Sexual activity: Not on file  Other Topics Concern   Not on file  Social History Narrative   Not on file   Social Determinants of Health   Financial Resource Strain: Low Risk  (08/31/2021)   Overall Financial Resource Strain (CARDIA)    Difficulty of Paying Living Expenses: Not hard at all  Food Insecurity: No Food Insecurity (03/13/2023)   Hunger Vital Sign    Worried About Running Out of Food in the Last Year: Never true    Ran  Out of Food in the Last Year: Never true  Transportation Needs: No Transportation Needs (03/13/2023)   PRAPARE - Administrator, Civil Service (Medical): No    Lack of Transportation (Non-Medical): No  Physical Activity: Inactive (08/31/2021)   Exercise Vital Sign    Days of Exercise per Week: 0 days    Minutes of Exercise per Session: 0 min  Stress: No Stress Concern Present (08/31/2021)   [employer] of Occupational Health - Occupational Stress Questionnaire    Feeling of Stress : Not at all  Social Connections: Socially Isolated (08/31/2021)   Social Connection and  Isolation Panel [NHANES]    Frequency of Communication with Friends and Family: More than three times a week    Frequency of Social Gatherings with Friends and Family: More than three times a week    Attends Religious Services: Never    Database administrator or Organizations: No    Attends Banker Meetings: Never    Marital Status: Widowed  Intimate Partner Violence: Not At Risk (03/13/2023)   Humiliation, Afraid, Rape, and Kick questionnaire    Fear of Current or Ex-Partner: No    Emotionally Abused: No    Physically Abused: No    Sexually Abused: No    Family History:   The patient's family history includes Healthy in her mother; Heart failure in her father.    ROS:  Please see the history of present illness.   Physical Exam/Data:   Vitals:   04/23/23 0430 04/23/23 0514 04/23/23 0637 04/23/23 0638  BP: (!) 153/92   (!) 154/103  Pulse: (!) 147   94  Resp: (!) 30   20  Temp:  97.6 F (36.4 C)  97.6 F (36.4 C)  TempSrc:  Axillary  Oral  SpO2: 93%   99%  Weight:   42.6 kg   Height:   5\' 3"  (1.6 m)     Intake/Output Summary (Last 24 hours) at 04/23/2023 0825 Last data filed at 04/23/2023 0500 Gross per 24 hour  Intake --  Output 2400 ml  Net -2400 ml   Filed Weights   04/23/23 0637  Weight: 42.6 kg   Body mass index is 16.64 kg/m.   Gen: no distress, elderly female  Neck: No JVD, soff vascular bruits Ears: no Homero Fellers Sign Cardiac: No Rubs or Gallops, systolic murmur, IRIR rhythm +2 radial pulses Respiratory: Clear to auscultation bilaterally, normal effort, normal  respiratory rate GI: Soft, nontender, non-distended  MS: non pitting edema;  moves all extremities Integument: Skin feels warm Neuro:  At time of evaluation, alert and oriented to person/place/time/situation  Psych: Normal affect, patient feels better  EKG:  The ECG that was done  was personally reviewed and demonstrates AF  Relevant CV Studies:  Cardiac Studies & Procedures      STRESS TESTS  MYOCARDIAL PERFUSION IMAGING 11/05/2012   ECHOCARDIOGRAM  ECHOCARDIOGRAM COMPLETE 06/10/2018  Narrative *[facility]* [address] [place], [place] [address] [phone]  ------------------------------------------------------------------- Transthoracic Echocardiography  Patient:    Cilia, Piedra MR #:       [id number] Study Date: 06/10/2018 Gender:     F Age:        44 Height:     160 cm Weight:     51.6 kg BSA:        1.51 m^2 Pt. Status: Room:  Hilma Favors, MD REFERRING    Lyn Records, MD ATTENDING  Charlton Haws, M.D. SONOGRAPHER  Clearence Ped, RCS PERFORMING   [facility], Outpatient  cc:  ------------------------------------------------------------------- LV EF: 60% -   65%  ------------------------------------------------------------------- Indications:      CHF (I50.9).  ------------------------------------------------------------------- Study Conclusions  - Left ventricle: The cavity size was normal. Systolic function was normal. The estimated ejection fraction was in the range of 60% to 65%. Wall motion was normal; there were no regional wall motion abnormalities. The study is not technically sufficient to allow evaluation of LV diastolic function. - Aortic valve: Sclerosis without stenosis. - Mitral valve: Calcified annulus. Moderately thickened, mildly calcified leaflets . There was mild regurgitation. - Left atrium: The atrium was moderately dilated. - Right ventricle: The cavity size was mildly dilated. Wall thickness was normal. - Right atrium: The atrium was moderately dilated. - Atrial septum: No defect or patent foramen ovale was identified. - Tricuspid valve: There was moderate regurgitation.  ------------------------------------------------------------------- Study data:  No prior study was available for comparison.  Study status:  Routine.  Procedure:  The patient reported no pain pre  or post test. Transthoracic echocardiography. Image quality was adequate.          Transthoracic echocardiography.  M-mode, complete 2D, spectral Doppler, and color Doppler.  Birthdate: Patient birthdate: 06/13/1933.  Age:  Patient is 89 yr old.  Sex: Gender: female.    BMI: 20.2 kg/m^2.  Blood pressure:     112/78 Patient status:  Outpatient.  Study date:  Study date: 06/10/2018. Study time: 11:52 AM.  Location:  [facility]  -------------------------------------------------------------------  ------------------------------------------------------------------- Left ventricle:  The cavity size was normal. Systolic function was normal. The estimated ejection fraction was in the range of 60% to 65%. Wall motion was normal; there were no regional wall motion abnormalities. The study is not technically sufficient to allow evaluation of LV diastolic function.  ------------------------------------------------------------------- Aortic valve:   Trileaflet; moderately thickened, moderately calcified leaflets. Mobility was not restricted. Sclerosis without stenosis.  Doppler:  Transvalvular velocity was within the normal range. There was no stenosis. There was no regurgitation.  ------------------------------------------------------------------- Aorta:  The aorta was normal, not dilated, and non-diseased. Aortic root: The aortic root was normal in size.  ------------------------------------------------------------------- Mitral valve:   Calcified annulus. Moderately thickened, mildly calcified leaflets . Mobility was not restricted.  Doppler: Transvalvular velocity was within the normal range. There was no evidence for stenosis. There was mild regurgitation.    Peak gradient (D): 5 mm Hg.  ------------------------------------------------------------------- Left atrium:  The atrium was moderately dilated.  ------------------------------------------------------------------- Atrial  septum:  No defect or patent foramen ovale was identified.  ------------------------------------------------------------------- Right ventricle:  The cavity size was mildly dilated. Wall thickness was normal. Systolic function was normal.  ------------------------------------------------------------------- Pulmonic valve:    Doppler:  Transvalvular velocity was within the normal range. There was no evidence for stenosis. There was mild regurgitation.  ------------------------------------------------------------------- Tricuspid valve:   Structurally normal valve.    Doppler: Transvalvular velocity was within the normal range. There was moderate regurgitation.  ------------------------------------------------------------------- Pulmonary artery:   The main pulmonary artery was normal-sized. Systolic pressure was within the normal range.  ------------------------------------------------------------------- Right atrium:  The atrium was moderately dilated.  ------------------------------------------------------------------- Pericardium:  The pericardium was normal in appearance. There was no pericardial effusion.  ------------------------------------------------------------------- Systemic veins: Inferior vena cava: The vessel was dilated. The respirophasic diameter changes were blunted (< 50%), consistent with elevated central venous pressure. Diameter: 21 mm.  ------------------------------------------------------------------- Post procedure conclusions Ascending Aorta:  - The aorta was normal, not dilated,  and non-diseased.  ------------------------------------------------------------------- Measurements  IVC                                    Value        Reference ID                                     21    mm     ----------  Left ventricle                         Value        Reference LV ID, ED, PLAX chordal        (L)     36.6  mm     43 - 52 LV ID, ES, PLAX  chordal        (L)     19.2  mm     23 - 38 LV fx shortening, PLAX chordal         48    %      >=29 LV PW thickness, ED                    10.2  mm     ---------- IVS/LV PW ratio, ED                    0.72         <=1.3 Stroke volume, 2D                      51    ml     ---------- Stroke volume/bsa, 2D                  34    ml/m^2 ---------- LV e&', lateral                         8.92  cm/s   ---------- LV E/e&', lateral                       12.56        ---------- LV e&', medial                          4.9   cm/s   ---------- LV E/e&', medial                        22.86        ---------- LV e&', average                         6.91  cm/s   ---------- LV E/e&', average                       16.21        ----------  Ventricular septum                     Value        Reference IVS thickness, ED                      7.37  mm     ----------  LVOT                                   Value        Reference LVOT ID, S                             19    mm     ---------- LVOT area                              2.84  cm^2   ---------- LVOT peak velocity, S                  79.1  cm/s   ---------- LVOT mean velocity, S                  53.8  cm/s   ---------- LVOT VTI, S                            18.1  cm     ----------  Aorta                                  Value        Reference Aortic root ID, ED                     25    mm     ----------  Left atrium                            Value        Reference LA ID, A-P, ES                         43    mm     ---------- LA ID/bsa, A-P                 (H)     2.84  cm/m^2 <=2.2 LA volume, S                           51.4  ml     ---------- LA volume/bsa, S                       34    ml/m^2 ---------- LA volume, ES, 1-p A4C                 49.4  ml     ---------- LA volume/bsa, ES, 1-p A4C             32.7  ml/m^2 ---------- LA volume, ES, 1-p A2C                 53.9  ml     ---------- LA volume/bsa, ES, 1-p A2C             35.6  ml/m^2  ----------  Mitral valve                           Value  Reference Mitral E-wave peak velocity            112   cm/s   ---------- Mitral deceleration time       (L)     141   ms     150 - 230 Mitral peak gradient, D                5     mm Hg  ----------  Tricuspid valve                        Value        Reference Tricuspid regurg peak velocity         328   cm/s   ---------- Tricuspid peak RV-RA gradient          43    mm Hg  ----------  Right atrium                           Value        Reference RA ID, S-I, ES, A4C            (H)     56    mm     34 - 49 RA area, ES, A4C               (H)     22    cm^2   8.3 - 19.5 RA volume, ES, A/L                     72.9  ml     ---------- RA volume/bsa, ES, A/L                 48.2  ml/m^2 ----------  Right ventricle                        Value        Reference TAPSE                                  10.8  mm     ---------- RV s&', lateral, S                      8.27  cm/s   ----------  Legend: (L)  and  (H)  mark values outside specified reference range.  ------------------------------------------------------------------- Prepared and Electronically Authenticated by  Charlton Haws, M.D. 2019-10-30T13:02:57             Laboratory Data:  Chemistry Recent Labs  Lab 04/22/23 1600 04/23/23 0729  NA 134* 134*  K 3.9 2.9*  CL 101 96*  CO2 24 27  GLUCOSE 111* 124*  BUN 12 10  CREATININE 0.52 0.47  CALCIUM 8.8* 8.8*  GFRNONAA >60 >60  ANIONGAP 9 11    No results for input(s): "PROT", "ALBUMIN", "AST", "ALT", "ALKPHOS", "BILITOT" in the last 168 hours. Hematology Recent Labs  Lab 04/22/23 1600 04/23/23 0729  WBC 7.5 8.2  RBC 3.86* 3.92  HGB 11.4* 11.3*  HCT 37.6 36.8  MCV 97.4 93.9  MCH 29.5 28.8  MCHC 30.3 30.7  RDW 15.2 15.2  PLT 268 250   Cardiac EnzymesNo results for input(s): "TROPONINI" in the last 168 hours. No results for input(s): "TROPIPOC" in the last 168 hours.  BNP Recent Labs  Lab  04/22/23 1600  BNP 636.3*    DDimer No results for input(s): "DDIMER" in the last 168 hours.   Assessment and Plan:    Heart Failure Presereved Ejection Fraction (diastolic) - acute on chronic - NYHA class IV, Stage C, hypervolemic, etiology from decrease in diuretic dosing suspected - complicated by hypokalemia - Diuretic regimen: Continue 40 IV lasix for now - Replace electrolytes PRN and keep K>4 and Mg>2. - BMP in PM - Continue current metoprolol  - continue current ACEi for now - SGLT2i may be considered tomorrow - aldactone today (12.5 mg PO Daily) - TTE ordered and is pending  Long Standing Persistent Atrial Fibrillation,  - Risk factors include age, gender, HF, and HTN, with vascular disease - CHADSVASC=6. - I'm unclear why on coumadin, for GI bleed, but this was decided by his primary cardiologist; will not change at this time - INR per pharmacy protocol - TSH WNL, echo pending  PAD with bilateral subclavian stenosis - seen by [facility] - continue home statin   For questions or updates, please contact [facility] Please consult [email or web address] for contact info under Cardiology/STEMI.   Riley Lam, MD FASE [facility] Cardiologist [facility]  [address] [place], [address] [place], [place] [address] [phone]  8:25 AM

## 2023-04-24 DIAGNOSIS — I5031 Acute diastolic (congestive) heart failure: Secondary | ICD-10-CM | POA: Diagnosis not present

## 2023-04-24 DIAGNOSIS — I482 Chronic atrial fibrillation, unspecified: Secondary | ICD-10-CM | POA: Diagnosis not present

## 2023-04-24 DIAGNOSIS — J810 Acute pulmonary edema: Secondary | ICD-10-CM | POA: Diagnosis not present

## 2023-04-24 DIAGNOSIS — R441 Visual hallucinations: Secondary | ICD-10-CM | POA: Diagnosis not present

## 2023-04-24 LAB — PROTIME-INR
INR: 2.1 — ABNORMAL HIGH (ref 0.8–1.2)
Prothrombin Time: 24 s — ABNORMAL HIGH (ref 11.4–15.2)

## 2023-04-24 LAB — CBC
HCT: 36.8 % (ref 36.0–46.0)
Hemoglobin: 11.7 g/dL — ABNORMAL LOW (ref 12.0–15.0)
MCH: 29.7 pg (ref 26.0–34.0)
MCHC: 31.8 g/dL (ref 30.0–36.0)
MCV: 93.4 fL (ref 80.0–100.0)
Platelets: 303 10*3/uL (ref 150–400)
RBC: 3.94 MIL/uL (ref 3.87–5.11)
RDW: 15.4 % (ref 11.5–15.5)
WBC: 8.9 10*3/uL (ref 4.0–10.5)
nRBC: 0 % (ref 0.0–0.2)

## 2023-04-24 LAB — BASIC METABOLIC PANEL
Anion gap: 9 (ref 5–15)
BUN: 13 mg/dL (ref 8–23)
CO2: 25 mmol/L (ref 22–32)
Calcium: 9 mg/dL (ref 8.9–10.3)
Chloride: 101 mmol/L (ref 98–111)
Creatinine, Ser: 0.51 mg/dL (ref 0.44–1.00)
GFR, Estimated: >60 mL/min (ref >60)
Glucose, Bld: 126 mg/dL — ABNORMAL HIGH (ref 70–99)
Potassium: 3.8 mmol/L (ref 3.5–5.1)
Sodium: 135 mmol/L (ref 135–145)

## 2023-04-24 LAB — MAGNESIUM: Magnesium: 1.9 mg/dL (ref 1.7–2.4)

## 2023-04-24 LAB — VITAMIN B12: Vitamin B-12: 1868 pg/mL — ABNORMAL HIGH (ref 180–914)

## 2023-04-24 LAB — FOLATE: Folate: 30.4 ng/mL (ref >5.9)

## 2023-04-24 MED ORDER — SPIRONOLACTONE 25 MG PO TABS
25.0000 mg | ORAL_TABLET | Freq: Every day | ORAL | Status: DC
  Administered 2023-04-25: 25 mg via ORAL
  Filled 2023-04-24: qty 1

## 2023-04-24 MED ORDER — POTASSIUM CHLORIDE CRYS ER 20 MEQ PO TBCR
40.0000 meq | EXTENDED_RELEASE_TABLET | ORAL | Status: AC
  Administered 2023-04-24 (×2): 40 meq via ORAL
  Filled 2023-04-24 (×2): qty 2

## 2023-04-24 MED ORDER — ENSURE ENLIVE PO LIQD
237.0000 mL | Freq: Two times a day (BID) | ORAL | Status: DC
  Administered 2023-04-24 – 2023-04-30 (×8): 237 mL via ORAL

## 2023-04-24 MED ORDER — FUROSEMIDE 10 MG/ML IJ SOLN
40.0000 mg | Freq: Two times a day (BID) | INTRAMUSCULAR | Status: DC
  Administered 2023-04-24: 40 mg via INTRAVENOUS
  Filled 2023-04-24 (×2): qty 4

## 2023-04-24 NOTE — Plan of Care (Signed)
Reinforce education. Monitor labs and vital signs. Provide pericare and use skin protectants with episodes of incontinence. Reorient during periods of confusion. Monitor intake and output. Continue current plan of care.

## 2023-04-24 NOTE — Progress Notes (Signed)
[facility]                                                                              Stacey [name], is a 89 y.o. female, DOB - September 27, 1932, ZOX:[id number] Admit date - 04/22/2023    Outpatient Primary MD for the patient is [name], MD  LOS - 2  days  Chief Complaint  Patient presents with   Weakness   Shortness of Breath       Brief summary   Patient is a 89 year old female with A-fib on warfarin, diastolic CHF, RA, GERD, HLD presented with exertional dyspnea, weakness.  She was on diuretics in the past however per her daughter, was discontinued when she was in the rehab due to potential kidney problems.  She has been on Lasix and torsemide.  Patient was noted to be short of breath and having exertional dyspnea, no cough, fevers, chest pain. Sodium 134, normal renal function, BNP 636, chest x-ray with moderate pleural effusions and vascular congestion.  Patient was admitted for further workup.   Assessment & Plan    Principal Problem:   Acute diastolic (congestive) heart failure (HCC) Moderate pleural effusions -Presented with dyspnea, lower extremity edema, pleural effusions, elevated BNP -Follows Dr. Carolan Clines outpatient -Placed on IV Lasix for diuresis, negative balance of 5.14 L, still feeling somewhat short of breath, increased to 40 mg twice daily by cardiology -Continue Lopressor 50 mg twice daily, lisinopril 5 mg daily  Active Problems:   Chronic atrial fibrillation (HCC), persistent - rate controlled, continue metoprolol -Patient has been on warfarin, will continue, defer to cardiology regarding anticoagulation  Mild chronic hyponatremia -Has chronic hyponatremia, sodium 132 on 03/16/2023 -Currently stable, resolved  Hypokalemia -Replaced    PVD (peripheral vascular disease) (HCC) -Continue statin, warfarin, has been seen by [facility]     Hallucinations, visual, ?  Vertigo -Has been ongoing for over a month, with visual  hallucinations -Has outpatient ophthalmology appointment -UA negative for UTI, MRI brain showed no acute intracranial abnormality, -B1 pending, B12, folate WNL  TSH normal 1.2 PT OT evaluation  Underweight Estimated body mass index is 16.64 kg/m as calculated from the following:   Height as of this encounter: 5\' 3"  (1.6 m).   Weight as of this encounter: 42.6 kg.  Code Status: DNR DVT Prophylaxis:   warfarin (COUMADIN) tablet 2.5 mg  warfarin (COUMADIN) tablet 5 mg   Level of Care: Level of care: Telemetry Family Communication: Updated patient Disposition Plan:      Remains inpatient appropriate: On IV Lasix   Procedures:  None  Consultants:   Cardiology  Antimicrobials:   Anti-infectives (From admission, onward)    None          Medications  atorvastatin  10 mg Oral Daily   feeding supplement  237 mL Oral BID BM   furosemide  40 mg Intravenous BID   gabapentin  400 mg Oral Daily   lisinopril  5 mg Oral Daily   metoprolol tartrate  50 mg Oral BID   pantoprazole  40 mg Oral Daily   potassium chloride  40 mEq Oral Q4H   predniSONE  5 mg Oral Q breakfast   sertraline  100 mg Oral Daily   sodium chloride flush  3 mL Intravenous Q12H   [START ON 04/25/2023] spironolactone  25 mg Oral Daily   warfarin  2.5 mg Oral Once per day on Sunday Monday Tuesday Wednesday Friday Saturday   warfarin  5 mg Oral Every Thursday   Warfarin - Pharmacist Dosing Inpatient   Does not apply q1600      Subjective:   Stacey [name] was seen and examined today.  Feels exhausted, dizzy and wiped out, states did not sleep well last night as she kept hearing noises (bells) from the nursing station.  Still has some shortness of breath.  No fevers or chills, chest pain or nausea vomiting, abdominal pain.  Objective:   Vitals:   04/23/23 1420 04/23/23 2020 04/24/23 0636 04/24/23 1024  BP: 137/71 (!) 141/96 (!) 158/112 (!) 142/87  Pulse: 96 (!) 43 (!) 110 81  Resp:  14 15   Temp:  98.6 F (37 C) 98.3 F (36.8 C) 98.7 F (37.1 C)   TempSrc: Oral Oral Oral   SpO2: 99% 92% 94%   Weight:      Height:        Intake/Output Summary (Last 24 hours) at 04/24/2023 1403 Last data filed at 04/24/2023 1333 Gross per 24 hour  Intake 3 ml  Output 1700 ml  Net -1697 ml     Wt Readings from Last 3 Encounters:  04/23/23 42.6 kg  04/07/23 40.4 kg  03/28/23 40.5 kg    Physical Exam General: Alert and oriented x 3, NAD Cardiovascular: S1 S2 clear, RRR.  Respiratory: Decreased breath sound at the bases Gastrointestinal: Soft, nontender, nondistended, NBS Ext: 1+ pedal edema bilaterally Neuro: no new deficits Psych: Normal affect     Data Reviewed:  I have personally reviewed following labs    CBC Lab Results  Component Value Date   WBC 8.9 04/24/2023   RBC 3.94 04/24/2023   HGB 11.7 (L) 04/24/2023   HCT 36.8 04/24/2023   MCV 93.4 04/24/2023   MCH 29.7 04/24/2023   PLT 303 04/24/2023   MCHC 31.8 04/24/2023   RDW 15.4 04/24/2023   LYMPHSABS 0.3 (L) 04/22/2023   MONOABS 0.4 04/22/2023   EOSABS 0.0 04/22/2023   BASOSABS 0.0 04/22/2023     Last metabolic panel Lab Results  Component Value Date   NA 135 04/24/2023   K 3.8 04/24/2023   CL 101 04/24/2023   CO2 25 04/24/2023   BUN 13 04/24/2023   CREATININE 0.51 04/24/2023   GLUCOSE 126 (H) 04/24/2023   GFRNONAA >60 04/24/2023   GFRAA 64 11/24/2019   CALCIUM 9.0 04/24/2023   PROT 6.0 (L) 03/13/2023   ALBUMIN 3.2 (L) 03/13/2023   BILITOT 1.3 (H) 03/13/2023   ALKPHOS 72 03/13/2023   AST 34 03/13/2023   ALT 24 03/13/2023   ANIONGAP 9 04/24/2023    CBG (last 3)  No results for input(s): "GLUCAP" in the last 72 hours.    Coagulation Profile: Recent Labs  Lab 04/22/23 1600 04/23/23 0729 04/24/23 0359  INR 2.0* 2.1* 2.1*     Radiology Studies: I have personally reviewed the imaging studies  MR BRAIN WO CONTRAST  Result Date: 04/23/2023 CLINICAL DATA:  Transient ischemic attack EXAM: MRI  HEAD WITHOUT CONTRAST TECHNIQUE: Multiplanar, multiecho pulse sequences of the brain and surrounding structures were obtained without intravenous contrast. COMPARISON:  None Available. FINDINGS: Brain: No acute infarct or hemorrhage. There is generalized volume  loss. Mild scattered white matter hyperintensity. The midline structures are normal. Vascular: Normal flow voids. Skull and upper cervical spine: Hypertrophic soft tissue around the odontoid process with mild mass effect on the spinal cord. Sinuses/Orbits: Paranasal sinuses are clear. Bilateral ocular implants. Other: None IMPRESSION: 1. No acute intracranial abnormality. 2. Generalized volume loss and mild chronic microvascular ischemia. 3. Hypertrophic soft tissue around the odontoid process with mild mass effect on the spinal cord. Electronically Signed   By: Deatra Robinson M.D.   On: 04/23/2023 23:49   ECHOCARDIOGRAM COMPLETE  Result Date: 04/23/2023    ECHOCARDIOGRAM REPORT   Patient Name:   [facility] MAE Blanck Date of Exam: 04/23/2023 Medical Rec #:  [id number]           Height:       63.0 in Accession #:    [id number]          Weight:       93.9 lb Date of Birth:  [birth year]           BSA:          1.402 m Patient Age:    89 years            BP:           126/64 mmHg Patient Gender: F                   HR:           109 bpm. Exam Location:  Inpatient Procedure: 2D Echo, Cardiac Doppler and Color Doppler Indications:    CHF-Acute Diastolic I50.31  History:        Patient has prior history of Echocardiogram examinations, most                 recent 06/10/2018. CHF, Arrythmias:Atrial Fibrillation; Risk                 Factors:Former Smoker and Dyslipidemia.  Sonographer:    Aron Baba Referring Phys: [id number] MIR M [facility]  Sonographer Comments: Suboptimal subcostal window. IMPRESSIONS  1. Left ventricular ejection fraction, by estimation, is 60 to 65%. The left ventricle has normal function. The left ventricle has no regional wall motion  abnormalities. Left ventricular diastolic parameters are indeterminate.  2. Right ventricular systolic function is mildly reduced. The right ventricular size is moderately enlarged.  3. Left atrial size was severely dilated.  4. Right atrial size was severely dilated.  5. The mitral valve is myxomatous. Moderate mitral valve regurgitation.  6. Tricuspid valve regurgitation is severe.  7. There is moderate calcification of the aortic valve. Aortic valve regurgitation is not visualized.  8. The inferior vena cava is normal in size with greater than 50% respiratory variability, suggesting right atrial pressure of 3 mmHg. FINDINGS  Left Ventricle: Left ventricular ejection fraction, by estimation, is 60 to 65%. The left ventricle has normal function. The left ventricle has no regional wall motion abnormalities. The left ventricular internal cavity size was normal in size. There is  no left ventricular hypertrophy. Left ventricular diastolic parameters are indeterminate. Right Ventricle: The right ventricular size is moderately enlarged. Right ventricular systolic function is mildly reduced. Left Atrium: Left atrial size was severely dilated. Right Atrium: Right atrial size was severely dilated. Pericardium: There is no evidence of pericardial effusion. Mitral Valve: The mitral valve is myxomatous. Moderate mitral valve regurgitation. Tricuspid Valve: Tricuspid valve regurgitation is severe. Aortic Valve: There is moderate calcification of the aortic valve. Aortic valve regurgitation  is not visualized. Pulmonic Valve: Pulmonic valve regurgitation is not visualized. Aorta: The aortic root and ascending aorta are structurally normal, with no evidence of dilitation. Venous: The inferior vena cava is normal in size with greater than 50% respiratory variability, suggesting right atrial pressure of 3 mmHg. IAS/Shunts: The interatrial septum was not well visualized.  LEFT VENTRICLE PLAX 2D LVIDd:         3.50 cm   Diastology  LVIDs:         2.10 cm   LV e' medial:    6.64 cm/s LV PW:         0.90 cm   LV E/e' medial:  15.8 LV IVS:        0.50 cm   LV e' lateral:   11.40 cm/s LVOT diam:     1.80 cm   LV E/e' lateral: 9.2 LV SV:         25 LV SV Index:   18 LVOT Area:     2.54 cm  RIGHT VENTRICLE RV S prime:     12.90 cm/s TAPSE (M-mode): 0.7 cm LEFT ATRIUM           Index        RIGHT ATRIUM           Index LA diam:      4.90 cm 3.49 cm/m   RA Area:     35.10 cm LA Vol (A2C): 24.2 ml 17.26 ml/m  RA Volume:   142.00 ml 101.26 ml/m LA Vol (A4C): 80.9 ml 57.69 ml/m  AORTIC VALVE LVOT Vmax:   50.30 cm/s LVOT Vmean:  35.400 cm/s LVOT VTI:    0.097 m  AORTA Ao Root diam: 3.20 cm Ao Asc diam:  3.10 cm MITRAL VALVE                 TRICUSPID VALVE MV Area (PHT): 6.54 cm      TR Peak grad:   49.8 mmHg MV Decel Time: 116 msec      TR Vmax:        353.00 cm/s MR Peak grad:   95.8 mmHg MR Mean grad:   61.0 mmHg    SHUNTS MR Vmax:        489.50 cm/s  Systemic VTI:  0.10 m MR Vmean:       373.0 cm/s   Systemic Diam: 1.80 cm MR PISA:        0.57 cm MR PISA Radius: 0.30 cm MV E velocity: 105.00 cm/s Carolan Clines Electronically signed by Carolan Clines Signature Date/Time: 04/23/2023/2:33:28 PM    Final    DG Chest Portable 1 View  Result Date: 04/22/2023 CLINICAL DATA:  Dyspnea. Exertional shortness of breath since yesterday. EXAM: PORTABLE CHEST 1 VIEW COMPARISON:  03/13/2023 FINDINGS: Marked patient rotation to the left. No gross change in enlargement of the cardiac silhouette. Interval moderate-sized left pleural effusion and small right pleural effusion. Mild increase in prominence of the interstitial markings, including Kerley lines. Left basilar atelectasis or pneumonia. Diffuse osteopenia. Diffuse osteopenia. Superior migration of the left humeral head. IMPRESSION: 1. Interval development of mild congestive heart failure. 2. Interval development of a moderate-sized left pleural effusion and small right pleural effusion. 3. Left basilar  atelectasis or pneumonia. 4. Chronic left rotator cuff tear. Electronically Signed   By: Beckie Salts M.D.   On: 04/22/2023 17:34   CT Head Wo Contrast  Result Date: 04/22/2023 CLINICAL DATA:  Weakness and vision impairment. EXAM: CT HEAD  WITHOUT CONTRAST TECHNIQUE: Contiguous axial images were obtained from the base of the skull through the vertex without intravenous contrast. RADIATION DOSE REDUCTION: This exam was performed according to the departmental dose-optimization program which includes automated exposure control, adjustment of the mA and/or kV according to patient size and/or use of iterative reconstruction technique. COMPARISON:  Head CT dated 03/13/2023. FINDINGS: Brain: Mild age-related atrophy and chronic microvascular ischemic changes. There is no acute intracranial hemorrhage. No mass effect or midline shift. No extra-axial fluid collection. Vascular: No hyperdense vessel or unexpected calcification. Skull: Normal. Negative for fracture or focal lesion. Sinuses/Orbits: No acute finding. Other: None IMPRESSION: 1. No acute intracranial pathology. 2. Mild age-related atrophy and chronic microvascular ischemic changes. Electronically Signed   By: Elgie Collard M.D.   On: 04/22/2023 17:20       Athol Bolds M.D. [facility] 04/24/2023, 2:03 PM  Available via Epic secure chat 7am-7pm After 7 pm, please refer to night coverage provider listed on amion.

## 2023-04-24 NOTE — Plan of Care (Signed)
  Problem: Nutrition: Goal: Adequate nutrition will be maintained Outcome: Progressing   Problem: Coping: Goal: Level of anxiety will decrease Outcome: Progressing   Problem: Elimination: Goal: Will not experience complications related to bowel motility Outcome: Progressing   Problem: Pain Managment: Goal: General experience of comfort will improve Outcome: Progressing   

## 2023-04-24 NOTE — Progress Notes (Addendum)
Progress Note  Patient Name: Stacey [name] Date of Encounter: 04/24/2023 Primary Cardiologist: Maisie Fus, MD   Subjective   Overnight Echo showed preserved LV function and bi-atrial enlargement. Patient notes new vertigo.  Vital Signs    Vitals:   04/23/23 1009 04/23/23 1420 04/23/23 2020 04/24/23 0636  BP: 126/64 137/71 (!) 141/96 (!) 158/112  Pulse: 77 96 (!) 43 (!) 110  Resp:   14 15  Temp: 98.3 F (36.8 C) 98.6 F (37 C) 98.3 F (36.8 C) 98.7 F (37.1 C)  TempSrc: Oral Oral Oral Oral  SpO2: 95% 99% 92% 94%  Weight:      Height:        Intake/Output Summary (Last 24 hours) at 04/24/2023 1023 Last data filed at 04/23/2023 2229 Gross per 24 hour  Intake 253 ml  Output 1800 ml  Net -1547 ml   Filed Weights   04/23/23 0637  Weight: 42.6 kg    Physical Exam   GEN: No acute distress.   Neck: No JVD Cardiac: RRR, no murmurs, rubs, or gallops.  Respiratory: Decreased breath sounds GI: Soft, nontender, non-distended  MS: Improved edema  Labs   Telemetry: AF rate ~ 105 with variance   Chemistry Recent Labs  Lab 04/22/23 1600 04/23/23 0729 04/24/23 0359  NA 134* 134* 135  K 3.9 2.9* 3.8  CL 101 96* 101  CO2 24 27 25   GLUCOSE 111* 124* 126*  BUN 12 10 13   CREATININE 0.52 0.47 0.51  CALCIUM 8.8* 8.8* 9.0  GFRNONAA >60 >60 >60  ANIONGAP 9 11 9      Hematology Recent Labs  Lab 04/22/23 1600 04/23/23 0729 04/24/23 0359  WBC 7.5 8.2 8.9  RBC 3.86* 3.92 3.94  HGB 11.4* 11.3* 11.7*  HCT 37.6 36.8 36.8  MCV 97.4 93.9 93.4  MCH 29.5 28.8 29.7  MCHC 30.3 30.7 31.8  RDW 15.2 15.2 15.4  PLT 268 250 303    Cardiac EnzymesNo results for input(s): "TROPONINI" in the last 168 hours. No results for input(s): "TROPIPOC" in the last 168 hours.   BNP Recent Labs  Lab 04/22/23 1600  BNP 636.3*     DDimer No results for input(s): "DDIMER" in the last 168 hours.   Cardiac Studies   Cardiac Studies & Procedures     STRESS  TESTS  MYOCARDIAL PERFUSION IMAGING 11/05/2012   ECHOCARDIOGRAM  ECHOCARDIOGRAM COMPLETE 04/23/2023  Narrative ECHOCARDIOGRAM REPORT    Patient Name:   [facility] MAE Eyerly Date of Exam: 04/23/2023 Medical Rec #:  [id number]           Height:       63.0 in Accession #:    [id number]          Weight:       93.9 lb Date of Birth:  22-Jun-1933           BSA:          1.402 m Patient Age:    89 years            BP:           126/64 mmHg Patient Gender: F                   HR:           109 bpm. Exam Location:  Inpatient  Procedure: 2D Echo, Cardiac Doppler and Color Doppler  Indications:    CHF-Acute Diastolic I50.31  History:  Patient has prior history of Echocardiogram examinations, most recent 06/10/2018. CHF, Arrythmias:Atrial Fibrillation; Risk Factors:Former Smoker and Dyslipidemia.  Sonographer:    Aron Baba Referring Phys: [id number] MIR M [facility]   Sonographer Comments: Suboptimal subcostal window. IMPRESSIONS   1. Left ventricular ejection fraction, by estimation, is 60 to 65%. The left ventricle has normal function. The left ventricle has no regional wall motion abnormalities. Left ventricular diastolic parameters are indeterminate. 2. Right ventricular systolic function is mildly reduced. The right ventricular size is moderately enlarged. 3. Left atrial size was severely dilated. 4. Right atrial size was severely dilated. 5. The mitral valve is myxomatous. Moderate mitral valve regurgitation. 6. Tricuspid valve regurgitation is severe. 7. There is moderate calcification of the aortic valve. Aortic valve regurgitation is not visualized. 8. The inferior vena cava is normal in size with greater than 50% respiratory variability, suggesting right atrial pressure of 3 mmHg.  FINDINGS Left Ventricle: Left ventricular ejection fraction, by estimation, is 60 to 65%. The left ventricle has normal function. The left ventricle has no regional wall motion abnormalities.  The left ventricular internal cavity size was normal in size. There is no left ventricular hypertrophy. Left ventricular diastolic parameters are indeterminate.  Right Ventricle: The right ventricular size is moderately enlarged. Right ventricular systolic function is mildly reduced.  Left Atrium: Left atrial size was severely dilated.  Right Atrium: Right atrial size was severely dilated.  Pericardium: There is no evidence of pericardial effusion.  Mitral Valve: The mitral valve is myxomatous. Moderate mitral valve regurgitation.  Tricuspid Valve: Tricuspid valve regurgitation is severe.  Aortic Valve: There is moderate calcification of the aortic valve. Aortic valve regurgitation is not visualized.  Pulmonic Valve: Pulmonic valve regurgitation is not visualized.  Aorta: The aortic root and ascending aorta are structurally normal, with no evidence of dilitation.  Venous: The inferior vena cava is normal in size with greater than 50% respiratory variability, suggesting right atrial pressure of 3 mmHg.  IAS/Shunts: The interatrial septum was not well visualized.   LEFT VENTRICLE PLAX 2D LVIDd:         3.50 cm   Diastology LVIDs:         2.10 cm   LV e' medial:    6.64 cm/s LV PW:         0.90 cm   LV E/e' medial:  15.8 LV IVS:        0.50 cm   LV e' lateral:   11.40 cm/s LVOT diam:     1.80 cm   LV E/e' lateral: 9.2 LV SV:         25 LV SV Index:   18 LVOT Area:     2.54 cm   RIGHT VENTRICLE RV S prime:     12.90 cm/s TAPSE (M-mode): 0.7 cm  LEFT ATRIUM           Index        RIGHT ATRIUM           Index LA diam:      4.90 cm 3.49 cm/m   RA Area:     35.10 cm LA Vol (A2C): 24.2 ml 17.26 ml/m  RA Volume:   142.00 ml 101.26 ml/m LA Vol (A4C): 80.9 ml 57.69 ml/m AORTIC VALVE LVOT Vmax:   50.30 cm/s LVOT Vmean:  35.400 cm/s LVOT VTI:    0.097 m  AORTA Ao Root diam: 3.20 cm Ao Asc diam:  3.10 cm  MITRAL VALVE  TRICUSPID VALVE MV Area (PHT): 6.54  cm      TR Peak grad:   49.8 mmHg MV Decel Time: 116 msec      TR Vmax:        353.00 cm/s MR Peak grad:   95.8 mmHg MR Mean grad:   61.0 mmHg    SHUNTS MR Vmax:        489.50 cm/s  Systemic VTI:  0.10 m MR Vmean:       373.0 cm/s   Systemic Diam: 1.80 cm MR PISA:        0.57 cm MR PISA Radius: 0.30 cm MV E velocity: 105.00 cm/s  Halford Decamp signed by Carolan Clines Signature Date/Time: 04/23/2023/2:33:28 PM    Final                Assessment & Plan   Heart Failure Presereved Ejection Fraction (diastolic) - acute on chronic - NYHA class IV, Stage C, hypervolemic, etiology from decrease in diuretic dosing suspected - Diuretic regimen: Increase to 40 IV lasix BID with K  - Continue current metoprolol  - continue current ACEi  - high risk of UTI, deferred SGLT2i; increase spironolactone dose - her vertigo is non cardiac in origin    Long Standing Persistent Atrial Fibrillation - Risk factors include age, gender, HF, and HTN, with vascular disease - CHADSVASC=6. - I'm unclear why on coumadin, for GI bleed, but this was decided by his primary cardiologist; will not change at this time - INR per pharmacy protocol - TSH WNL   PAD with bilateral subclavian stenosis - seen by VVS - continue home statin  Called daughter to discuss care at length [name])    For questions or updates, please contact [facility] Please consult [email or web address] for contact info under Cardiology/STEMI.      Riley Lam, MD FASE [facility] Cardiologist [facility]  [address] [place], [address] [place], [place] [address] [phone]  10:23 AM

## 2023-04-24 NOTE — Progress Notes (Signed)
ANTICOAGULATION CONSULT NOTE - Follow Up Consult  Pharmacy Consult for Coumadin Indication: atrial fibrillation  Allergies  Allergen Reactions   Sulfa Antibiotics Anaphylaxis   Tape Other (See Comments)    BRUISES VERY EASILY!!!!   Codeine Nausea And Vomiting   Hydroxychloroquine Other (See Comments)    GI upset   Shellfish-Derived Products Other (See Comments)    Daughter was unsure of the reaction- "happened years ago" and patient avoids foods that contain shellfish   Latex Rash   Neosporin Original [Bacitracin-Neomycin-Polymyxin] Rash   Polysporin [Bacitracin-Polymyxin B] Rash    Patient Measurements: Height: 5\' 3"  (160 cm) Weight: 42.6 kg (93 lb 14.7 oz) IBW/kg (Calculated) : 52.4  Vital Signs: Temp: 98.7 F (37.1 C) (09/12 0636) Temp Source: Oral (09/12 0636) BP: 158/112 (09/12 0636) Pulse Rate: 110 (09/12 0636)  Labs: Recent Labs    04/22/23 1600 04/22/23 1745 04/23/23 0729 04/24/23 0359  HGB 11.4*  --  11.3* 11.7*  HCT 37.6  --  36.8 36.8  PLT 268  --  250 303  LABPROT 22.7*  --  23.5* 24.0*  INR 2.0*  --  2.1* 2.1*  CREATININE 0.52  --  0.47 0.51  TROPONINIHS 6 7  --   --     Estimated Creatinine Clearance: 32.7 mL/min (by C-G formula based on SCr of 0.51 mg/dL).   Assessment:  AC/Heme: hx afib on warfarin PTA. - Hgb 11.7 stable. Plts WNL. INR 2.1 in gaol   - PTA warfarin dose: 2.5mg   daily except 5mg  on Thurs per [facility] clinic note  Goal of Therapy:  INR 2-3 Monitor platelets by anticoagulation protocol: Yes   Plan:  Con't home regimen DAily INR   Jimel Myler S. Merilynn Finland, PharmD, BCPS Clinical Staff Pharmacist [email or web address] Merilynn Finland, Brennon Otterness Stillinger 04/24/2023,9:57 AM

## 2023-04-24 NOTE — TOC Initial Note (Signed)
Transition of Care [facility]) - Initial/Assessment Note    Patient Details  Name: Stacey [name] MRN: [id number] Date of Birth: [birth year]  Transition of Care [facility]) CM/SW Contact:    Larrie Kass, LCSW Phone Number: 04/24/2023, 2:17 PM  Clinical Narrative:                 Pt is active with [facility] for HHPT/OT. TOC to follow.   Expected Discharge Plan: Home w Home Health Services Barriers to Discharge: Continued Medical Work up   Patient Goals and CMS Choice            Expected Discharge Plan and Services                                              Prior Living Arrangements/Services                       Activities of Daily Living Home Assistive Devices/Equipment: Environmental consultant (specify type), Cane (specify quad or straight), Other (Comment) (transport chair) ADL Screening (condition at time of admission) Patient's cognitive ability adequate to safely complete daily activities?: Yes Is the patient deaf or have difficulty hearing?: Yes Does the patient have difficulty seeing, even when wearing glasses/contacts?: Yes Does the patient have difficulty concentrating, remembering, or making decisions?: Yes Patient able to express need for assistance with ADLs?: Yes Does the patient have difficulty dressing or bathing?: Yes Independently performs ADLs?: No Does the patient have difficulty walking or climbing stairs?: Yes Weakness of Legs: Both Weakness of Arms/Hands: Both  Permission Sought/Granted                  Emotional Assessment              Admission diagnosis:  Acute pulmonary edema (HCC) [J81.0] Acute diastolic (congestive) heart failure (HCC) [I50.31] Patient Active Problem List   Diagnosis Date Noted   Hallucinations, visual 04/23/2023   Acute diastolic (congestive) heart failure (HCC) 04/22/2023   Skin ulcer of second toe of right foot with necrosis of bone (HCC) 03/28/2023   Pressure injury of toe of right foot,  unstageable (HCC) 03/28/2023   Protein-calorie malnutrition, severe 03/17/2023   Closed fracture of multiple pubic rami, left, initial encounter (HCC) 03/15/2023   Closed fracture of left superior pubic ramus (HCC) 03/13/2023   Long term systemic steroid user 06/18/2021   Pain in limb 06/18/2021   Primary osteoarthritis 06/18/2021   Radicular pain 06/18/2021   Sciatica 06/18/2021   Shoulder joint pain 06/18/2021   Presbycusis of both ears 11/21/2020   Rheumatoid arthritis involving multiple sites (HCC) 03/28/2020   History of total hip replacement, left 03/28/2020   PVD (peripheral vascular disease) (HCC) 11/20/2019   Osteopenia 11/20/2019   Stenosis of both subclavian arteries (HCC) 05/17/2019   Conductive hearing loss, bilateral 07/03/2018   Bilateral impacted cerumen 07/03/2018   Acute swimmer's ear of left side 07/03/2018   Long term (current) use of anticoagulants [Z79.01] 07/25/2017   Chronic atrial fibrillation (HCC) 07/11/2017   Primary osteoarthritis involving multiple joints 07/11/2017   Pseudogout 07/11/2017   Neuropathy 07/11/2017   Lymphedema 07/11/2017   Kyphosis of cervical region 07/11/2017   Chronic heart failure with preserved ejection fraction (HFpEF) (HCC) 07/11/2017   PCP:  [name], MD Pharmacy:   [facility] [phone] [facility] - [place], [place] - [address] [place] [address] Phone: [phone] Fax: [phone]     Social Determinants of Health (SDOH) Social History: SDOH Screenings   Food Insecurity: No Food Insecurity (04/23/2023)  Housing: Low Risk  (04/23/2023)  Transportation Needs: No Transportation Needs (04/23/2023)  Utilities: Not At Risk (04/23/2023)  Alcohol Screen: Low Risk  (08/30/2020)  Depression (PHQ2-9): Low Risk  (04/07/2023)  Financial Resource Strain: Low Risk  (08/31/2021)  Physical Activity: Inactive (08/31/2021)  Social Connections: Socially Isolated (08/31/2021)  Stress: No Stress Concern Present  (08/31/2021)  Tobacco Use: Medium Risk (04/22/2023)   SDOH Interventions:     Readmission Risk Interventions     No data to display

## 2023-04-25 DIAGNOSIS — I5031 Acute diastolic (congestive) heart failure: Secondary | ICD-10-CM | POA: Diagnosis not present

## 2023-04-25 DIAGNOSIS — R441 Visual hallucinations: Secondary | ICD-10-CM | POA: Diagnosis not present

## 2023-04-25 DIAGNOSIS — I482 Chronic atrial fibrillation, unspecified: Secondary | ICD-10-CM | POA: Diagnosis not present

## 2023-04-25 DIAGNOSIS — I739 Peripheral vascular disease, unspecified: Secondary | ICD-10-CM | POA: Diagnosis not present

## 2023-04-25 LAB — CBC
HCT: 37.8 % (ref 36.0–46.0)
Hemoglobin: 11.8 g/dL — ABNORMAL LOW (ref 12.0–15.0)
MCH: 29.5 pg (ref 26.0–34.0)
MCHC: 31.2 g/dL (ref 30.0–36.0)
MCV: 94.5 fL (ref 80.0–100.0)
Platelets: 256 10*3/uL (ref 150–400)
RBC: 4 MIL/uL (ref 3.87–5.11)
RDW: 15.4 % (ref 11.5–15.5)
WBC: 7.8 10*3/uL (ref 4.0–10.5)
nRBC: 0 % (ref 0.0–0.2)

## 2023-04-25 LAB — BASIC METABOLIC PANEL
Anion gap: 8 (ref 5–15)
BUN: 14 mg/dL (ref 8–23)
CO2: 26 mmol/L (ref 22–32)
Calcium: 9.1 mg/dL (ref 8.9–10.3)
Chloride: 100 mmol/L (ref 98–111)
Creatinine, Ser: 0.44 mg/dL (ref 0.44–1.00)
GFR, Estimated: >60 mL/min (ref >60)
Glucose, Bld: 117 mg/dL — ABNORMAL HIGH (ref 70–99)
Potassium: 4.3 mmol/L (ref 3.5–5.1)
Sodium: 134 mmol/L — ABNORMAL LOW (ref 135–145)

## 2023-04-25 LAB — PROTIME-INR
INR: 2.3 — ABNORMAL HIGH (ref 0.8–1.2)
Prothrombin Time: 25.2 s — ABNORMAL HIGH (ref 11.4–15.2)

## 2023-04-25 LAB — CORTISOL: Cortisol, Plasma: 12.2 ug/dL

## 2023-04-25 MED ORDER — SODIUM CHLORIDE 0.9 % IV BOLUS
250.0000 mL | Freq: Once | INTRAVENOUS | Status: AC
  Administered 2023-04-25: 250 mL via INTRAVENOUS

## 2023-04-25 MED ORDER — TORSEMIDE 20 MG PO TABS
40.0000 mg | ORAL_TABLET | Freq: Every day | ORAL | Status: DC
  Administered 2023-04-25: 40 mg via ORAL
  Filled 2023-04-25 (×2): qty 2

## 2023-04-25 MED ORDER — MIDODRINE HCL 5 MG PO TABS
10.0000 mg | ORAL_TABLET | Freq: Three times a day (TID) | ORAL | Status: DC
  Administered 2023-04-25 – 2023-04-27 (×7): 10 mg via ORAL
  Filled 2023-04-25 (×7): qty 2

## 2023-04-25 NOTE — TOC Progression Note (Signed)
Transition of Care [facility]) - Progression Note    Patient Details  Name: Stacey [name] MRN: [id number] Date of Birth: [birth year]  Transition of Care [facility]) CM/SW Contact  Howell Rucks, RN Phone Number: 04/25/2023, 11:21 AM  Clinical Narrative: [facility] consult for SNF placement. PT eval completed, recommendation for HH PT. Met with pt and dtr at bedside to introduce role of TOC/NCM and review for dc planning, PT recommendation for [facility] PT. Pt declined HH PT, reports she has been in short term rehab, had HH therapy, right now she would like to return home, reports she resides with her dtr and son in law who assist in her care and her granddaughter also assist as needed. Pt reports she has walker at home she uses as needed and a BSC. Pt reports she feels safe returning home with support from her family. TOC will continue to follow.      Expected Discharge Plan: Home w Home Health Services Barriers to Discharge: Continued Medical Work up  Expected Discharge Plan and Services                                               Social Determinants of Health (SDOH) Interventions SDOH Screenings   Food Insecurity: No Food Insecurity (04/23/2023)  Housing: Low Risk  (04/23/2023)  Transportation Needs: No Transportation Needs (04/23/2023)  Utilities: Not At Risk (04/23/2023)  Alcohol Screen: Low Risk  (08/30/2020)  Depression (PHQ2-9): Low Risk  (04/07/2023)  Financial Resource Strain: Low Risk  (08/31/2021)  Physical Activity: Inactive (08/31/2021)  Social Connections: Socially Isolated (08/31/2021)  Stress: No Stress Concern Present (08/31/2021)  Tobacco Use: Medium Risk (04/22/2023)    Readmission Risk Interventions    04/25/2023   11:20 AM  Readmission Risk Prevention Plan  Transportation Screening Complete  PCP or Specialist Appt within 5-7 Days Complete  Home Care Screening Complete  Medication Review (RN CM) Complete

## 2023-04-25 NOTE — Care Management Important Message (Signed)
Important Message  Patient Details IM Letter given. Name: Vineta Basinger MRN: [id number] Date of Birth: 1932/12/21   Medicare Important Message Given:  Yes     Caren Macadam 04/25/2023, 1:56 PM

## 2023-04-25 NOTE — Progress Notes (Signed)
ANTICOAGULATION CONSULT NOTE - Follow Up Consult  Pharmacy Consult for Coumadin Indication: atrial fibrillation  Allergies  Allergen Reactions   Sulfa Antibiotics Anaphylaxis   Tape Other (See Comments)    BRUISES VERY EASILY!!!!   Codeine Nausea And Vomiting   Hydroxychloroquine Other (See Comments)    GI upset   Shellfish-Derived Products Other (See Comments)    Daughter was unsure of the reaction- "happened years ago" and patient avoids foods that contain shellfish   Latex Rash   Neosporin Original [Bacitracin-Neomycin-Polymyxin] Rash   Polysporin [Bacitracin-Polymyxin B] Rash    Patient Measurements: Height: 5\' 3"  (160 cm) Weight: 41.1 kg (90 lb 9.7 oz) IBW/kg (Calculated) : 52.4  Vital Signs: Temp: 98.2 F (36.8 C) (09/13 0442) Temp Source: Oral (09/13 0442) BP: 130/97 (09/13 1003) Pulse Rate: 88 (09/13 1003)  Labs: Recent Labs    04/22/23 1600 04/22/23 1745 04/23/23 0729 04/24/23 0359 04/25/23 0420  HGB 11.4*  --  11.3* 11.7* 11.8*  HCT 37.6  --  36.8 36.8 37.8  PLT 268  --  250 303 256  LABPROT 22.7*  --  23.5* 24.0* 25.2*  INR 2.0*  --  2.1* 2.1* 2.3*  CREATININE 0.52  --  0.47 0.51 0.44  TROPONINIHS 6 7  --   --   --     Estimated Creatinine Clearance: 31.5 mL/min (by C-G formula based on SCr of 0.44 mg/dL).   Assessment:  AC/Heme: hx afib on warfarin PTA. Also has PVD. CHADSVASC=6.  - Hgb 11.8 stable. Plts WNL. INR 2.3 in goal   - PTA warfarin dose: 2.5mg   daily except 5mg  on Thurs per [facility] clinic note  Goal of Therapy:  INR 2-3 Monitor platelets by anticoagulation protocol: Yes   Plan:  Con't home regimen DAily INR   Orian Amberg S. Merilynn Finland, PharmD, BCPS Clinical Staff Pharmacist [email or web address] Merilynn Finland, Koltin Wehmeyer Stillinger 04/25/2023,10:10 AM

## 2023-04-25 NOTE — Evaluation (Addendum)
Physical Therapy Evaluation Patient Details Name: Stacey [name] MRN: [id number] DOB: May 30, 1933 Today's Date: 04/25/2023  History of Present Illness  89 year old female with A-fib on warfarin, diastolic CHF, RA, GERD, HLD presented with exertional dyspnea, weakness.  She was on diuretics in the past however per her daughter, was discontinued when she was in the rehab due to potential kidney problems.  She has been on Lasix and torsemide.  Patient was noted to be short of breath and having exertional dyspnea, chest x-ray with moderate pleural effusions and vascular congestion. Pt had recent admission 8/1-03/18/23 with a fall, L superior pubic rami fx, B rib fxs, and compression fxs at T10, T11, L5.  Clinical Impression  Pt admitted with above diagnosis. Pt ambulated 14' x 2 with seated rest on toilet. SpO2 85% on room air with walking, HR 130-141 walking. SpO2 95% on room air at rest. Noted order for vestibular evaluation, pt denied vertigo/dizziness throughout session, she stated she has a h/o vertigo and that her symptoms are well managed with prn meclazine. Pt would benefit from HHPT. She has 24/7 assist at home from her daughter.  Pt currently with functional limitations due to the deficits listed below (see PT Problem List). Pt will benefit from acute skilled PT to increase their independence and safety with mobility to allow discharge.           If plan is discharge home, recommend the following: A little help with walking and/or transfers;A little help with bathing/dressing/bathroom;Assistance with cooking/housework;Assist for transportation;Help with stairs or ramp for entrance   Can travel by private vehicle        Equipment Recommendations None recommended by PT  Recommendations for Other Services       Functional Status Assessment Patient has had a recent decline in their functional status and demonstrates the ability to make significant improvements in function in a reasonable  and predictable amount of time.     Precautions / Restrictions Precautions Precautions: Fall;Other (comment) Precaution Comments: monitor O2 Restrictions Weight Bearing Restrictions: No      Mobility  Bed Mobility Overal bed mobility: Modified Independent             General bed mobility comments: HOB up, used rail    Transfers Overall transfer level: Needs assistance Equipment used: Rolling walker (2 wheels) Transfers: Sit to/from Stand Sit to Stand: Min assist           General transfer comment: min A to power up from bed, mod A to power up from commode    Ambulation/Gait Ambulation/Gait assistance: Contact guard assist Gait Distance (Feet): 14 Feet Assistive device: Rolling walker (2 wheels) Gait Pattern/deviations: Step-through pattern, Trunk flexed, Decreased stride length Gait velocity: decr     General Gait Details: 14' x 2 with seated rest on toilet, steady, pt with profound kyphosis (pt reports this is baseline), VCs for proximity to RW, SpO2 85% on room air walking, HR 130-141 walking, HR 111 at rest, SpO2 95% on room air after 2 minutes seated rest with pursed lip breathing  Stairs            Wheelchair Mobility     Tilt Bed    Modified Rankin (Stroke Patients Only)       Balance Overall balance assessment: Needs assistance, History of Falls Sitting-balance support: Feet supported, No upper extremity supported Sitting balance-Leahy Scale: Fair     Standing balance support: Bilateral upper extremity supported, During functional activity, Reliant on assistive device for balance Standing  balance-Leahy Scale: Poor                               Pertinent Vitals/Pain Pain Assessment Pain Assessment: No/denies pain    Home Living Family/patient expects to be discharged to:: Private residence Living Arrangements: Children Available Help at Discharge: Family;Available 24 hours/day Type of Home: House Home Access: Stairs  to enter Entrance Stairs-Rails: None Entrance Stairs-Number of Steps: 1   Home Layout: One level Home Equipment: Agricultural consultant (2 wheels);Cane - single point;Toilet riser;Grab bars - tub/shower;Grab bars - toilet;Shower seat Additional Comments: [facility] following recent admission 1 month ago following pelvic fx; lives with daughter    Prior Function               Mobility Comments: walks with RW, has someone with her anytime she's walking ADLs Comments: assist for bathing/dressing; sponge bathes     Extremity/Trunk Assessment   Upper Extremity Assessment Upper Extremity Assessment: Defer to OT evaluation    Lower Extremity Assessment Lower Extremity Assessment: Overall WFL for tasks assessed    Cervical / Trunk Assessment Cervical / Trunk Assessment: Kyphotic  Communication   Communication Communication: No apparent difficulties  Cognition Arousal: Alert Behavior During Therapy: WFL for tasks assessed/performed Overall Cognitive Status: Within Functional Limits for tasks assessed                                          General Comments      Exercises     Assessment/Plan    PT Assessment Patient needs continued PT services  PT Problem List Decreased activity tolerance;Cardiopulmonary status limiting activity       PT Treatment Interventions DME instruction;Gait training;Therapeutic exercise;Therapeutic activities;Patient/family education    PT Goals (Current goals can be found in the Care Plan section)  Acute Rehab PT Goals Patient Stated Goal: return home; continue recovering from pelvic fracture PT Goal Formulation: With patient/family Time For Goal Achievement: 05/09/23 Potential to Achieve Goals: Good    Frequency Min 1X/week     Co-evaluation               AM-PAC PT "6 Clicks" Mobility  Outcome Measure Help needed turning from your back to your side while in a flat bed without using bedrails?: None Help needed  moving from lying on your back to sitting on the side of a flat bed without using bedrails?: A Little Help needed moving to and from a bed to a chair (including a wheelchair)?: A Little Help needed standing up from a chair using your arms (e.g., wheelchair or bedside chair)?: A Little Help needed to walk in hospital room?: A Little Help needed climbing 3-5 steps with a railing? : A Lot 6 Click Score: 18    End of Session Equipment Utilized During Treatment: Gait belt Activity Tolerance: Patient limited by fatigue Patient left: in chair;with chair alarm set;with call bell/phone within reach Nurse Communication: Mobility status PT Visit Diagnosis: Other abnormalities of gait and mobility (R26.89);Difficulty in walking, not elsewhere classified (R26.2)    Time: 4098-1191 PT Time Calculation (min) (ACUTE ONLY): 31 min   Charges:   PT Evaluation $PT Eval Moderate Complexity: 1 Mod PT Treatments $Gait Training: 8-22 mins PT General Charges $$ ACUTE PT VISIT: 1 Visit         Tamala Ser PT  04/25/2023  Acute Rehabilitation Services  Office [phone]

## 2023-04-25 NOTE — Progress Notes (Signed)
[facility]                                                                              Stacey [name], is a 89 y.o. female, DOB - 07/15/1933, ZOX:[id number] Admit date - 04/22/2023    Outpatient Primary MD for the patient is [name], MD  LOS - 3  days  Chief Complaint  Patient presents with   Weakness   Shortness of Breath       Brief summary   Patient is a 89 year old female with A-fib on warfarin, diastolic CHF, RA, GERD, HLD presented with exertional dyspnea, weakness.  She was on diuretics in the past however per her daughter, was discontinued when she was in the rehab due to potential kidney problems.  She has been on Lasix and torsemide.  Patient was noted to be short of breath and having exertional dyspnea, no cough, fevers, chest pain. Sodium 134, normal renal function, BNP 636, chest x-ray with moderate pleural effusions and vascular congestion.  Patient was admitted for further workup.   Assessment & Plan    Principal Problem:   Acute diastolic (congestive) heart failure (HCC) Moderate pleural effusions -Presented with dyspnea, lower extremity edema, pleural effusions, elevated BNP -Follows Dr. Carolan Clines outpatient -2D echo showed EF of 60 to 65%, no regional WMA, right ventricular systolic function mildly reduced severe TR, moderate MR -Placed on IV Lasix for diuresis, with the negative balance of 5.3 L, weight 90.6lbs today -Transitioned to oral torsemide today, also placed on Aldactone.   -Patient also on Lopressor 50 mg twice daily, lisinopril 5 mg daily  -Noted to be hypotensive with BP in 70s, symptomatic, hold diuretics and antihypertensives.  Gave to 50 cc IV fluid bolus, midodrine.  Patient has received oral antihypertensives today.   Active Problems:   Chronic atrial fibrillation (HCC), persistent - rate controlled, on metoprolol -Patient has been on warfarin, will continue, defer to cardiology regarding  anticoagulation  Mild chronic hyponatremia -Has chronic hyponatremia, sodium 132 on 03/16/2023 -Stable  Hypokalemia -Replaced    PVD (peripheral vascular disease) (HCC) -Continue statin, warfarin, has been seen by [facility]     Hallucinations, visual, ?  Vertigo -Has been ongoing for over a month, with visual hallucinations -Has outpatient ophthalmology appointment -UA negative for UTI, MRI brain showed no acute intracranial abnormality, -B1 pending, B12, folate WNL  TSH normal 1.2 -PT evaluation completed, noted to have hypoxia 85% on room air with walking, tachycardia, no vertigo -Per patient, no further hallucinations  Underweight Estimated body mass index is 16.05 kg/m as calculated from the following:   Height as of this encounter: 5\' 3"  (1.6 m).   Weight as of this encounter: 41.1 kg.  Code Status: DNR DVT Prophylaxis:   warfarin (COUMADIN) tablet 2.5 mg  warfarin (COUMADIN) tablet 5 mg   Level of Care: Level of care: Telemetry Family Communication: Updated patient and daughter at the bedside Disposition Plan:      Remains inpatient appropriate:    Procedures:  None  Consultants:   Cardiology  Antimicrobials:   Anti-infectives (From admission, onward)    None  Medications  atorvastatin  10 mg Oral Daily   feeding supplement  237 mL Oral BID BM   gabapentin  400 mg Oral Daily   midodrine  10 mg Oral TID WC   pantoprazole  40 mg Oral Daily   predniSONE  5 mg Oral Q breakfast   sertraline  100 mg Oral Daily   sodium chloride flush  3 mL Intravenous Q12H   spironolactone  25 mg Oral Daily   torsemide  40 mg Oral Daily   warfarin  2.5 mg Oral Once per day on Sunday Monday Tuesday Wednesday Friday Saturday   warfarin  5 mg Oral Every Thursday   Warfarin - Pharmacist Dosing Inpatient   Does not apply q1600      Subjective:   Marcianne Swanick was seen and examined today.  This morning, felt better, daughter at the bedside.  Subsequently was  noted to be hypotensive with SBP in 70s.  No chest pain, abdominal pain, nausea or vomiting.  Objective:   Vitals:   04/25/23 1331 04/25/23 1343 04/25/23 1355 04/25/23 1400  BP: (!) 77/56 (!) 88/67 (!) 104/55 106/66  Pulse: 80 72 86 78  Resp:      Temp:      TempSrc:      SpO2: 92%  97% 96%  Weight:      Height:        Intake/Output Summary (Last 24 hours) at 04/25/2023 1509 Last data filed at 04/25/2023 1358 Gross per 24 hour  Intake 130 ml  Output 700 ml  Net -570 ml     Wt Readings from Last 3 Encounters:  04/25/23 41.1 kg  04/07/23 40.4 kg  03/28/23 40.5 kg   Physical Exam General: Alert and oriented x 3, NAD Cardiovascular: S1 S2 clear, RRR.  Respiratory: CTAB Gastrointestinal: Soft, nontender, nondistended, NBS Ext: no pedal edema bilaterally Neuro: no new deficits Skin: No rashes Psych: normal affect   Data Reviewed:  I have personally reviewed following labs    CBC Lab Results  Component Value Date   WBC 7.8 04/25/2023   RBC 4.00 04/25/2023   HGB 11.8 (L) 04/25/2023   HCT 37.8 04/25/2023   MCV 94.5 04/25/2023   MCH 29.5 04/25/2023   PLT 256 04/25/2023   MCHC 31.2 04/25/2023   RDW 15.4 04/25/2023   LYMPHSABS 0.3 (L) 04/22/2023   MONOABS 0.4 04/22/2023   EOSABS 0.0 04/22/2023   BASOSABS 0.0 04/22/2023     Last metabolic panel Lab Results  Component Value Date   NA 134 (L) 04/25/2023   K 4.3 04/25/2023   CL 100 04/25/2023   CO2 26 04/25/2023   BUN 14 04/25/2023   CREATININE 0.44 04/25/2023   GLUCOSE 117 (H) 04/25/2023   GFRNONAA >60 04/25/2023   GFRAA 64 11/24/2019   CALCIUM 9.1 04/25/2023   PROT 6.0 (L) 03/13/2023   ALBUMIN 3.2 (L) 03/13/2023   BILITOT 1.3 (H) 03/13/2023   ALKPHOS 72 03/13/2023   AST 34 03/13/2023   ALT 24 03/13/2023   ANIONGAP 8 04/25/2023    CBG (last 3)  No results for input(s): "GLUCAP" in the last 72 hours.    Coagulation Profile: Recent Labs  Lab 04/22/23 1600 04/23/23 0729 04/24/23 0359  04/25/23 0420  INR 2.0* 2.1* 2.1* 2.3*     Radiology Studies: I have personally reviewed the imaging studies  MR BRAIN WO CONTRAST  Result Date: 04/23/2023 CLINICAL DATA:  Transient ischemic attack EXAM: MRI HEAD WITHOUT CONTRAST TECHNIQUE: Multiplanar, multiecho pulse sequences  of the brain and surrounding structures were obtained without intravenous contrast. COMPARISON:  None Available. FINDINGS: Brain: No acute infarct or hemorrhage. There is generalized volume loss. Mild scattered white matter hyperintensity. The midline structures are normal. Vascular: Normal flow voids. Skull and upper cervical spine: Hypertrophic soft tissue around the odontoid process with mild mass effect on the spinal cord. Sinuses/Orbits: Paranasal sinuses are clear. Bilateral ocular implants. Other: None IMPRESSION: 1. No acute intracranial abnormality. 2. Generalized volume loss and mild chronic microvascular ischemia. 3. Hypertrophic soft tissue around the odontoid process with mild mass effect on the spinal cord. Electronically Signed   By: Deatra Robinson M.D.   On: 04/23/2023 23:49       Tamekia Rotter M.D. [facility] 04/25/2023, 3:09 PM  Available via Epic secure chat 7am-7pm After 7 pm, please refer to night coverage provider listed on amion.

## 2023-04-25 NOTE — Progress Notes (Signed)
Pt refused to reposition now and earlier in the shift. Educated pt on the risk of skin breakdown. Pt continued to refuse. Will continue to offer repositioning.

## 2023-04-25 NOTE — Progress Notes (Signed)
SATURATION QUALIFICATIONS: (This note is used to comply with regulatory documentation for home oxygen)  Patient Saturations on Room Air at Rest = 95%  Patient Saturations on Room Air while Ambulating = 85%  Patient Saturations on TBD Liters of oxygen while Ambulating = --%  Please briefly explain why patient needs home oxygen: to maintain appropriate SpO2 levels.   Ralene Bathe Kistler PT 04/25/2023  Acute Rehabilitation Services  Office [phone]

## 2023-04-25 NOTE — Progress Notes (Signed)
Approximately 1330 Dr Isidoro Donning notified of pt being hypotensive and minimally responding. BP lowest reading was 77/56. Orders received and interventions completed. Pt responded to interventions increasing bp to 106/66. Pt is now alert. Will continue to monitor.

## 2023-04-25 NOTE — Progress Notes (Signed)
OT Cancellation Note  Patient Details Name: Stacey [name] MRN: [id number] DOB: Sep 22, 1932   Cancelled Treatment:    Reason Eval/Treat Not Completed: Fatigue/lethargy limiting ability to participate.      Reuben Likes, OTR/L 04/25/2023, 5:07 PM

## 2023-04-25 NOTE — Plan of Care (Signed)
Problem: Education: Goal: Knowledge of General Education information will improve Description: Including pain rating scale, medication(s)/side effects and non-pharmacologic comfort measures Outcome: Progressing   Problem: Clinical Measurements: Goal: Diagnostic test results will improve Outcome: Progressing   Problem: Activity: Goal: Risk for activity intolerance will decrease Outcome: Progressing   Problem: Nutrition: Goal: Adequate nutrition will be maintained Outcome: Progressing   Problem: Coping: Goal: Level of anxiety will decrease Outcome: Progressing

## 2023-04-25 NOTE — Progress Notes (Signed)
Progress Note  Patient Name: Stacey [name] Date of Encounter: 04/25/2023 Primary Cardiologist: Maisie Fus, MD   Subjective   Out ~ 2 L today. Notes no cough. Vertigo has improved  Vital Signs    Vitals:   04/24/23 1024 04/24/23 2113 04/25/23 0442 04/25/23 0500  BP: (!) 142/87 128/76 119/74   Pulse: 81 82 95   Resp:  20 20   Temp:  97.6 F (36.4 C) 98.2 F (36.8 C)   TempSrc:  Oral Oral   SpO2:  98% 92%   Weight:    41.1 kg  Height:        Intake/Output Summary (Last 24 hours) at 04/25/2023 0845 Last data filed at 04/24/2023 2300 Gross per 24 hour  Intake 480 ml  Output 1900 ml  Net -1420 ml   Filed Weights   04/23/23 0637 04/24/23 0718 04/25/23 0500  Weight: 42.6 kg 41.4 kg 41.1 kg    Physical Exam   GEN: No acute distress.   Neck: No JVD Cardiac: IRIR, no murmurs, rubs, or gallops.  Respiratory: CTAB GI: Soft, nontender, non-distended  MS: no edema  Labs   Telemetry: AF rate ~ 95   Chemistry Recent Labs  Lab 04/23/23 0729 04/24/23 0359 04/25/23 0420  NA 134* 135 134*  K 2.9* 3.8 4.3  CL 96* 101 100  CO2 27 25 26   GLUCOSE 124* 126* 117*  BUN 10 13 14   CREATININE 0.47 0.51 0.44  CALCIUM 8.8* 9.0 9.1  GFRNONAA >60 >60 >60  ANIONGAP 11 9 8      Hematology Recent Labs  Lab 04/23/23 0729 04/24/23 0359 04/25/23 0420  WBC 8.2 8.9 7.8  RBC 3.92 3.94 4.00  HGB 11.3* 11.7* 11.8*  HCT 36.8 36.8 37.8  MCV 93.9 93.4 94.5  MCH 28.8 29.7 29.5  MCHC 30.7 31.8 31.2  RDW 15.2 15.4 15.4  PLT 250 303 256    Cardiac EnzymesNo results for input(s): "TROPONINI" in the last 168 hours. No results for input(s): "TROPIPOC" in the last 168 hours.   BNP Recent Labs  Lab 04/22/23 1600  BNP 636.3*     DDimer No results for input(s): "DDIMER" in the last 168 hours.   Cardiac Studies   Cardiac Studies & Procedures     STRESS TESTS  MYOCARDIAL PERFUSION IMAGING 11/05/2012   ECHOCARDIOGRAM  ECHOCARDIOGRAM COMPLETE  04/23/2023  Narrative ECHOCARDIOGRAM REPORT    Patient Name:   Stacey [name] Date of Exam: 04/23/2023 Medical Rec #:  [id number]           Height:       63.0 in Accession #:    [id number]          Weight:       93.9 lb Date of Birth:  September 12, 1932           BSA:          1.402 m Patient Age:    89 years            BP:           126/64 mmHg Patient Gender: F                   HR:           109 bpm. Exam Location:  Inpatient  Procedure: 2D Echo, Cardiac Doppler and Color Doppler  Indications:    CHF-Acute Diastolic I50.31  History:        Patient has prior history of  Echocardiogram examinations, most recent 06/10/2018. CHF, Arrythmias:Atrial Fibrillation; Risk Factors:Former Smoker and Dyslipidemia.  Sonographer:    Aron Baba Referring Phys: [id number] MIR M [facility]   Sonographer Comments: Suboptimal subcostal window. IMPRESSIONS   1. Left ventricular ejection fraction, by estimation, is 60 to 65%. The left ventricle has normal function. The left ventricle has no regional wall motion abnormalities. Left ventricular diastolic parameters are indeterminate. 2. Right ventricular systolic function is mildly reduced. The right ventricular size is moderately enlarged. 3. Left atrial size was severely dilated. 4. Right atrial size was severely dilated. 5. The mitral valve is myxomatous. Moderate mitral valve regurgitation. 6. Tricuspid valve regurgitation is severe. 7. There is moderate calcification of the aortic valve. Aortic valve regurgitation is not visualized. 8. The inferior vena cava is normal in size with greater than 50% respiratory variability, suggesting right atrial pressure of 3 mmHg.  FINDINGS Left Ventricle: Left ventricular ejection fraction, by estimation, is 60 to 65%. The left ventricle has normal function. The left ventricle has no regional wall motion abnormalities. The left ventricular internal cavity size was normal in size. There is no left ventricular  hypertrophy. Left ventricular diastolic parameters are indeterminate.  Right Ventricle: The right ventricular size is moderately enlarged. Right ventricular systolic function is mildly reduced.  Left Atrium: Left atrial size was severely dilated.  Right Atrium: Right atrial size was severely dilated.  Pericardium: There is no evidence of pericardial effusion.  Mitral Valve: The mitral valve is myxomatous. Moderate mitral valve regurgitation.  Tricuspid Valve: Tricuspid valve regurgitation is severe.  Aortic Valve: There is moderate calcification of the aortic valve. Aortic valve regurgitation is not visualized.  Pulmonic Valve: Pulmonic valve regurgitation is not visualized.  Aorta: The aortic root and ascending aorta are structurally normal, with no evidence of dilitation.  Venous: The inferior vena cava is normal in size with greater than 50% respiratory variability, suggesting right atrial pressure of 3 mmHg.  IAS/Shunts: The interatrial septum was not well visualized.   LEFT VENTRICLE PLAX 2D LVIDd:         3.50 cm   Diastology LVIDs:         2.10 cm   LV e' medial:    6.64 cm/s LV PW:         0.90 cm   LV E/e' medial:  15.8 LV IVS:        0.50 cm   LV e' lateral:   11.40 cm/s LVOT diam:     1.80 cm   LV E/e' lateral: 9.2 LV SV:         25 LV SV Index:   18 LVOT Area:     2.54 cm   RIGHT VENTRICLE RV S prime:     12.90 cm/s TAPSE (M-mode): 0.7 cm  LEFT ATRIUM           Index        RIGHT ATRIUM           Index LA diam:      4.90 cm 3.49 cm/m   RA Area:     35.10 cm LA Vol (A2C): 24.2 ml 17.26 ml/m  RA Volume:   142.00 ml 101.26 ml/m LA Vol (A4C): 80.9 ml 57.69 ml/m AORTIC VALVE LVOT Vmax:   50.30 cm/s LVOT Vmean:  35.400 cm/s LVOT VTI:    0.097 m  AORTA Ao Root diam: 3.20 cm Ao Asc diam:  3.10 cm  MITRAL VALVE  TRICUSPID VALVE MV Area (PHT): 6.54 cm      TR Peak grad:   49.8 mmHg MV Decel Time: 116 msec      TR Vmax:        353.00  cm/s MR Peak grad:   95.8 mmHg MR Mean grad:   61.0 mmHg    SHUNTS MR Vmax:        489.50 cm/s  Systemic VTI:  0.10 m MR Vmean:       373.0 cm/s   Systemic Diam: 1.80 cm MR PISA:        0.57 cm MR PISA Radius: 0.30 cm MV E velocity: 105.00 cm/s  Halford Decamp signed by Carolan Clines Signature Date/Time: 04/23/2023/2:33:28 PM    Final                Assessment & Plan   Heart Failure Presereved Ejection Fraction (diastolic) - acute on chronic - NYHA class IV, Stage C, euvolemic, etiology from decrease in diuretic dosing suspected - Diuretic regimen: Torsemide 40 mg PO daily - Continue current metoprolol  - continue current ACEi  - high risk of UTI, deferred SGLT2i; increase spironolactone dose (25 mg PO daily - her vertigo is non cardiac in origin - tomorrow may need K supplementation for outpatient   Long Standing Persistent Atrial Fibrillation - Risk factors include age, gender, HF, and HTN, with vascular disease - CHADSVASC=6. - I'm unclear why on coumadin, for GI bleed, but this was decided by his primary cardiologist; will not change at this time - INR per pharmacy protocol - TSH WNL   PAD with bilateral subclavian stenosis - seen by VVS - continue home statin    For questions or updates, please contact [facility] Please consult [email or web address] for contact info under Cardiology/STEMI.      Riley Lam, MD FASE [facility] Cardiologist [facility]  [address] [place], [address] [place], [place] [address] [phone]  8:45 AM

## 2023-04-25 NOTE — Progress Notes (Signed)
Heart Failure Navigator Progress Note  Assessed for Heart & Vascular TOC clinic readiness.  Patient does not meet criteria due to EF 60-65% and has follow up with [facility] on 10/01.   Navigator will sign off at this time.    Gohan Collister,RN, BSN,MSN Heart Failure Nurse Navigator. Contact by secure chat only.

## 2023-04-26 DIAGNOSIS — I5031 Acute diastolic (congestive) heart failure: Secondary | ICD-10-CM | POA: Diagnosis not present

## 2023-04-26 DIAGNOSIS — R441 Visual hallucinations: Secondary | ICD-10-CM | POA: Diagnosis not present

## 2023-04-26 DIAGNOSIS — J810 Acute pulmonary edema: Secondary | ICD-10-CM | POA: Diagnosis not present

## 2023-04-26 DIAGNOSIS — I482 Chronic atrial fibrillation, unspecified: Secondary | ICD-10-CM | POA: Diagnosis not present

## 2023-04-26 LAB — BASIC METABOLIC PANEL
Anion gap: 11 (ref 5–15)
BUN: 22 mg/dL (ref 8–23)
CO2: 29 mmol/L (ref 22–32)
Calcium: 8.5 mg/dL — ABNORMAL LOW (ref 8.9–10.3)
Chloride: 98 mmol/L (ref 98–111)
Creatinine, Ser: 0.7 mg/dL (ref 0.44–1.00)
GFR, Estimated: >60 mL/min (ref >60)
Glucose, Bld: 101 mg/dL — ABNORMAL HIGH (ref 70–99)
Potassium: 3.3 mmol/L — ABNORMAL LOW (ref 3.5–5.1)
Sodium: 138 mmol/L (ref 135–145)

## 2023-04-26 LAB — CBC
HCT: 37.4 % (ref 36.0–46.0)
Hemoglobin: 11.6 g/dL — ABNORMAL LOW (ref 12.0–15.0)
MCH: 29.5 pg (ref 26.0–34.0)
MCHC: 31 g/dL (ref 30.0–36.0)
MCV: 95.2 fL (ref 80.0–100.0)
Platelets: 272 10*3/uL (ref 150–400)
RBC: 3.93 MIL/uL (ref 3.87–5.11)
RDW: 15.4 % (ref 11.5–15.5)
WBC: 7.4 10*3/uL (ref 4.0–10.5)
nRBC: 0 % (ref 0.0–0.2)

## 2023-04-26 LAB — PROTIME-INR
INR: 2.3 — ABNORMAL HIGH (ref 0.8–1.2)
Prothrombin Time: 25.8 s — ABNORMAL HIGH (ref 11.4–15.2)

## 2023-04-26 LAB — BRAIN NATRIURETIC PEPTIDE: B Natriuretic Peptide: 154.7 pg/mL — ABNORMAL HIGH (ref 0.0–100.0)

## 2023-04-26 MED ORDER — METOPROLOL TARTRATE 25 MG PO TABS
12.5000 mg | ORAL_TABLET | Freq: Two times a day (BID) | ORAL | Status: DC
  Administered 2023-04-26 (×2): 12.5 mg via ORAL
  Filled 2023-04-26 (×2): qty 1

## 2023-04-26 MED ORDER — SODIUM CHLORIDE 0.9 % IV BOLUS
250.0000 mL | Freq: Once | INTRAVENOUS | Status: AC
  Administered 2023-04-26: 250 mL via INTRAVENOUS

## 2023-04-26 MED ORDER — POTASSIUM CHLORIDE CRYS ER 20 MEQ PO TBCR
40.0000 meq | EXTENDED_RELEASE_TABLET | Freq: Once | ORAL | Status: AC
  Administered 2023-04-26: 40 meq via ORAL
  Filled 2023-04-26: qty 2

## 2023-04-26 MED ORDER — HYDROCORTISONE SOD SUC (PF) 100 MG IJ SOLR
50.0000 mg | Freq: Two times a day (BID) | INTRAMUSCULAR | Status: AC
  Administered 2023-04-26 (×2): 50 mg via INTRAVENOUS
  Filled 2023-04-26 (×2): qty 1

## 2023-04-26 NOTE — Progress Notes (Signed)
1845 Dr Jan Fireman via secure chat of daughter, [name], request for an update. Cell phone [phone].

## 2023-04-26 NOTE — Progress Notes (Addendum)
OT Cancellation Note  Patient Details Name: Stacey [name] MRN: [id number] DOB: [birth year]   Cancelled Treatment:    Reason Eval/Treat Not Completed: Patient not medically ready HR up to 130s in bed at rest with patient concerned about location of dentured. Nursing assisting patient with this search. OT to continue to follow.  Rosalio Loud, MS Acute Rehabilitation Department Office# [phone]  04/26/2023, 1:14 PM

## 2023-04-26 NOTE — Plan of Care (Signed)

## 2023-04-26 NOTE — Progress Notes (Signed)
[facility]                                                                              Stacey [name], is a 89 y.o. female, DOB - [birth year], XBJ:[id number] Admit date - 04/22/2023    Outpatient Primary MD for the patient is [name], MD  LOS - 4  days  Chief Complaint  Patient presents with   Weakness   Shortness of Breath       Brief summary   Patient is a 89 year old female with A-fib on warfarin, diastolic CHF, RA, GERD, HLD presented with exertional dyspnea, weakness.  She was on diuretics in the past however per her daughter, was discontinued when she was in the rehab due to potential kidney problems.  She has been on Lasix and torsemide.  Patient was noted to be short of breath and having exertional dyspnea, no cough, fevers, chest pain. Sodium 134, normal renal function, BNP 636, chest x-ray with moderate pleural effusions and vascular congestion.  Patient was admitted for further workup.   Assessment & Plan    Principal Problem:   Acute diastolic (congestive) heart failure (HCC) Moderate pleural effusions -Presented with dyspnea, lower extremity edema, pleural effusions, elevated BNP -Follows Dr. Carolan Clines outpatient -2D echo showed EF of 60 to 65%, no regional WMA, right ventricular systolic function mildly reduced severe TR, moderate MR -Patient was placed on IV Lasix for diuresis, negative balance of 5.2 L however on 9/13, was noted to be hypotensive.   -Antihypertensive, diuretics held, received IVF 250cc x2, placed on midodrine 10 mg 3 times daily -Cardiology following, added back beta-blocker due to tachycardia  -follow closely if able to maintain BP, will benefit from oral diuretic -May have relative adrenal insufficiency from chronic prednisone use, due to persistent hypotension, will give 50 mg IV hydrocortisone x 2   Active Problems:   Chronic atrial fibrillation (HCC), persistent -BP somewhat stable, resumed  beta-blocker -Patient has been on warfarin, will continue, defer to cardiology regarding anticoagulation  Mild chronic hyponatremia -Has chronic hyponatremia, sodium 132 on 03/16/2023 -Stable  Hypokalemia -Replaced    PVD (peripheral vascular disease) (HCC) -Continue statin, warfarin, has been seen by VVS  History of rheumatoid arthritis -On chronic prednisone 5 mg p.o. daily -Random cortisol level 12.2 wnl.  However can have relative adrenal insufficiency, persistent lower BP, will give 50mg  of IV hydrocortisone x2    Hallucinations, visual, ?  Vertigo -Has been ongoing for over a month, with visual hallucinations -Has outpatient ophthalmology appointment -UA negative for UTI, MRI brain showed no acute intracranial abnormality, -B1 pending, B12, folate WNL  TSH normal 1.2 -Per patient, no further hallucinations  Underweight Estimated body mass index is 16.4 kg/m as calculated from the following:   Height as of this encounter: 5\' 3"  (1.6 m).   Weight as of this encounter: 42 kg.  Code Status: DNR DVT Prophylaxis:   warfarin (COUMADIN) tablet 2.5 mg  warfarin (COUMADIN) tablet 5 mg   Level of Care: Level of care: Telemetry Family Communication: Updated patient Disposition Plan:      Remains inpatient appropriate:    Procedures:  None  Consultants:   Cardiology  Antimicrobials:   Anti-infectives (From admission, onward)    None          Medications  atorvastatin  10 mg Oral Daily   feeding supplement  237 mL Oral BID BM   gabapentin  400 mg Oral Daily   metoprolol tartrate  12.5 mg Oral BID   midodrine  10 mg Oral TID WC   pantoprazole  40 mg Oral Daily   potassium chloride  40 mEq Oral Once   predniSONE  5 mg Oral Q breakfast   sertraline  100 mg Oral Daily   sodium chloride flush  3 mL Intravenous Q12H   warfarin  2.5 mg Oral Once per day on Sunday Monday Tuesday Wednesday Friday Saturday   warfarin  5 mg Oral Every Thursday   Warfarin - Pharmacist  Dosing Inpatient   Does not apply q1600      Subjective:   [name] was seen and examined today.  No acute issues this morning however still had hypotension overnight received IV fluid bolus 250 cc for borderline BP 83/57 Feeling comfortable, no acute chest pain or shortness of breath.  Objective:   Vitals:   04/26/23 0450 04/26/23 0911 04/26/23 1238 04/26/23 1321  BP:  104/63 100/69 92/61  Pulse:  (!) 104 87   Resp:  20  20  Temp:  97.9 F (36.6 C)  98 F (36.7 C)  TempSrc:  Oral    SpO2:  94%  94%  Weight: 42 kg     Height:        Intake/Output Summary (Last 24 hours) at 04/26/2023 1338 Last data filed at 04/26/2023 0914 Gross per 24 hour  Intake 80 ml  Output --  Net 80 ml     Wt Readings from Last 3 Encounters:  04/26/23 42 kg  04/07/23 40.4 kg  03/28/23 40.5 kg   Physical Exam General: Alert and oriented x 3, NAD Cardiovascular: Irregular Respiratory: CTAB, no wheezing Gastrointestinal: Soft, nontender, nondistended, NBS Ext: no pedal edema bilaterally Neuro: no new deficits Psych: Normal affect    Data Reviewed:  I have personally reviewed following labs    CBC Lab Results  Component Value Date   WBC 7.4 04/26/2023   RBC 3.93 04/26/2023   HGB 11.6 (L) 04/26/2023   HCT 37.4 04/26/2023   MCV 95.2 04/26/2023   MCH 29.5 04/26/2023   PLT 272 04/26/2023   MCHC 31.0 04/26/2023   RDW 15.4 04/26/2023   LYMPHSABS 0.3 (L) 04/22/2023   MONOABS 0.4 04/22/2023   EOSABS 0.0 04/22/2023   BASOSABS 0.0 04/22/2023     Last metabolic panel Lab Results  Component Value Date   NA 138 04/26/2023   K 3.3 (L) 04/26/2023   CL 98 04/26/2023   CO2 29 04/26/2023   BUN 22 04/26/2023   CREATININE 0.70 04/26/2023   GLUCOSE 101 (H) 04/26/2023   GFRNONAA >60 04/26/2023   GFRAA 64 11/24/2019   CALCIUM 8.5 (L) 04/26/2023   PROT 6.0 (L) 03/13/2023   ALBUMIN 3.2 (L) 03/13/2023   BILITOT 1.3 (H) 03/13/2023   ALKPHOS 72 03/13/2023   AST 34 03/13/2023   ALT  24 03/13/2023   ANIONGAP 11 04/26/2023    CBG (last 3)  No results for input(s): "GLUCAP" in the last 72 hours.    Coagulation Profile: Recent Labs  Lab 04/22/23 1600 04/23/23 0729 04/24/23 0359 04/25/23 0420 04/26/23 0501  INR 2.0* 2.1* 2.1* 2.3* 2.3*  Radiology Studies: I have personally reviewed the imaging studies  No results found.     Stacey [name] Ranger M.D. [facility] 04/26/2023, 1:38 PM  Available via Epic secure chat 7am-7pm After 7 pm, please refer to night coverage provider listed on amion.

## 2023-04-26 NOTE — Progress Notes (Signed)
Progress Note  Patient Name: Stacey [name] Date of Encounter: 04/26/2023 Primary Cardiologist: Maisie Fus, MD   Subjective   Asymptomatic low blood pressure.  Received 500 CC back. BNP has impoved 4 fold. Patient notes no further shortness of breath.  No cough.  No Leg swelling.  No vertigo.  Eldest daugther in via tele-conference to bring up questions and concerns.  Vital Signs    Vitals:   04/25/23 2312 04/25/23 2315 04/26/23 0448 04/26/23 0450  BP: 107/65 (!) 153/105 122/81   Pulse:  73 80   Resp:   18   Temp:   98.2 F (36.8 C)   TempSrc:   Oral   SpO2:   94%   Weight:    42 kg  Height:        Intake/Output Summary (Last 24 hours) at 04/26/2023 0743 Last data filed at 04/25/2023 2318 Gross per 24 hour  Intake 70 ml  Output --  Net 70 ml   Filed Weights   04/24/23 0718 04/25/23 0500 04/26/23 0450  Weight: 41.4 kg 41.1 kg 42 kg    Physical Exam   GEN: No acute distress.  Priminent kyphoscoliosis Neck: No JVD Cardiac: IRIR, no murmurs, rubs, or gallops.  Respiratory: CTAB GI: Soft, nontender, non-distended  MS: Edema has resolved  Labs   Telemetry:AF RVR   Chemistry Recent Labs  Lab 04/24/23 0359 04/25/23 0420 04/26/23 0501  NA 135 134* 138  K 3.8 4.3 3.3*  CL 101 100 98  CO2 25 26 29   GLUCOSE 126* 117* 101*  BUN 13 14 22   CREATININE 0.51 0.44 0.70  CALCIUM 9.0 9.1 8.5*  GFRNONAA >60 >60 >60  ANIONGAP 9 8 11      Hematology Recent Labs  Lab 04/24/23 0359 04/25/23 0420 04/26/23 0501  WBC 8.9 7.8 7.4  RBC 3.94 4.00 3.93  HGB 11.7* 11.8* 11.6*  HCT 36.8 37.8 37.4  MCV 93.4 94.5 95.2  MCH 29.7 29.5 29.5  MCHC 31.8 31.2 31.0  RDW 15.4 15.4 15.4  PLT 303 256 272    Cardiac EnzymesNo results for input(s): "TROPONINI" in the last 168 hours. No results for input(s): "TROPIPOC" in the last 168 hours.   BNP Recent Labs  Lab 04/22/23 1600 04/26/23 0501  BNP 636.3* 154.7*     DDimer No results for input(s): "DDIMER" in the  last 168 hours.   Cardiac Studies   Cardiac Studies & Procedures     STRESS TESTS  MYOCARDIAL PERFUSION IMAGING 11/05/2012   ECHOCARDIOGRAM  ECHOCARDIOGRAM COMPLETE 04/23/2023  Narrative ECHOCARDIOGRAM REPORT    Patient Name:   Stacey [name] Date of Exam: 04/23/2023 Medical Rec #:  [id number]           Height:       63.0 in Accession #:    [id number]          Weight:       93.9 lb Date of Birth:  [birth year]           BSA:          1.402 m Patient Age:    89 years            BP:           126/64 mmHg Patient Gender: F                   HR:           109 bpm. Exam Location:  Inpatient  Procedure: 2D Echo, Cardiac Doppler and Color Doppler  Indications:    CHF-Acute Diastolic I50.31  History:        Patient has prior history of Echocardiogram examinations, most recent 06/10/2018. CHF, Arrythmias:Atrial Fibrillation; Risk Factors:Former Smoker and Dyslipidemia.  Sonographer:    Aron Baba Referring Phys: [id number] MIR M [facility]   Sonographer Comments: Suboptimal subcostal window. IMPRESSIONS   1. Left ventricular ejection fraction, by estimation, is 60 to 65%. The left ventricle has normal function. The left ventricle has no regional wall motion abnormalities. Left ventricular diastolic parameters are indeterminate. 2. Right ventricular systolic function is mildly reduced. The right ventricular size is moderately enlarged. 3. Left atrial size was severely dilated. 4. Right atrial size was severely dilated. 5. The mitral valve is myxomatous. Moderate mitral valve regurgitation. 6. Tricuspid valve regurgitation is severe. 7. There is moderate calcification of the aortic valve. Aortic valve regurgitation is not visualized. 8. The inferior vena cava is normal in size with greater than 50% respiratory variability, suggesting right atrial pressure of 3 mmHg.  FINDINGS Left Ventricle: Left ventricular ejection fraction, by estimation, is 60 to 65%. The left ventricle  has normal function. The left ventricle has no regional wall motion abnormalities. The left ventricular internal cavity size was normal in size. There is no left ventricular hypertrophy. Left ventricular diastolic parameters are indeterminate.  Right Ventricle: The right ventricular size is moderately enlarged. Right ventricular systolic function is mildly reduced.  Left Atrium: Left atrial size was severely dilated.  Right Atrium: Right atrial size was severely dilated.  Pericardium: There is no evidence of pericardial effusion.  Mitral Valve: The mitral valve is myxomatous. Moderate mitral valve regurgitation.  Tricuspid Valve: Tricuspid valve regurgitation is severe.  Aortic Valve: There is moderate calcification of the aortic valve. Aortic valve regurgitation is not visualized.  Pulmonic Valve: Pulmonic valve regurgitation is not visualized.  Aorta: The aortic root and ascending aorta are structurally normal, with no evidence of dilitation.  Venous: The inferior vena cava is normal in size with greater than 50% respiratory variability, suggesting right atrial pressure of 3 mmHg.  IAS/Shunts: The interatrial septum was not well visualized.   LEFT VENTRICLE PLAX 2D LVIDd:         3.50 cm   Diastology LVIDs:         2.10 cm   LV e' medial:    6.64 cm/s LV PW:         0.90 cm   LV E/e' medial:  15.8 LV IVS:        0.50 cm   LV e' lateral:   11.40 cm/s LVOT diam:     1.80 cm   LV E/e' lateral: 9.2 LV SV:         25 LV SV Index:   18 LVOT Area:     2.54 cm   RIGHT VENTRICLE RV S prime:     12.90 cm/s TAPSE (M-mode): 0.7 cm  LEFT ATRIUM           Index        RIGHT ATRIUM           Index LA diam:      4.90 cm 3.49 cm/m   RA Area:     35.10 cm LA Vol (A2C): 24.2 ml 17.26 ml/m  RA Volume:   142.00 ml 101.26 ml/m LA Vol (A4C): 80.9 ml 57.69 ml/m AORTIC VALVE LVOT Vmax:   50.30 cm/s LVOT Vmean:  35.400 cm/s LVOT VTI:  0.097 m  AORTA Ao Root diam: 3.20 cm Ao Asc  diam:  3.10 cm  MITRAL VALVE                 TRICUSPID VALVE MV Area (PHT): 6.54 cm      TR Peak grad:   49.8 mmHg MV Decel Time: 116 msec      TR Vmax:        353.00 cm/s MR Peak grad:   95.8 mmHg MR Mean grad:   61.0 mmHg    SHUNTS MR Vmax:        489.50 cm/s  Systemic VTI:  0.10 m MR Vmean:       373.0 cm/s   Systemic Diam: 1.80 cm MR PISA:        0.57 cm MR PISA Radius: 0.30 cm MV E velocity: 105.00 cm/s  Halford Decamp signed by Carolan Clines Signature Date/Time: 04/23/2023/2:33:28 PM    Final                Assessment & Plan   Heart Failure Presereved Ejection Fraction (diastolic) - acute on chronic on admission - NYHA class IV, Stage C, euvolemic - Diuretic regimen: Holding diuretic today; suspect that at rehab hypotension lead to diuretic stop - TRH evaluating for Addison's component - Stop ACEi going forward - BP has normalized, but with midodrine - will add back metoprolol today - at DC goal will be either torsemide 20 mg daily or PRN with close hospital follow up with primary cardiologist and with K - high risk of UTI, deferred SGLT2i;  with increase spironolactone, asymptomatic hypotension   Long Standing Persistent Atrial Fibrillation - Risk factors include age, gender, HF, and HTN, with vascular disease - CHADSVASC=6. - see prior notes for coumadin discussion;  INR per pharmacy protocol - TSH WNL   PAD with bilateral subclavian stenosis - seen by VVS - continue home statin    For questions or updates, please contact [facility] Please consult [email or web address] for contact info under Cardiology/STEMI.      Riley Lam, MD FASE [facility] Cardiologist [facility]  [address], [address] [place], [place] [address] [phone]  7:43 AM

## 2023-04-26 NOTE — Plan of Care (Signed)
Problem: Clinical Measurements: Goal: Will remain free from infection Outcome: Progressing Goal: Respiratory complications will improve Outcome: Progressing   Problem: Coping: Goal: Level of anxiety will decrease Outcome: Progressing   Problem: Pain Managment: Goal: General experience of comfort will improve Outcome: Progressing

## 2023-04-26 NOTE — Progress Notes (Signed)
Patient Name: Stacey [name]           DOB: [birth year]  MRN: [id number]      Admission Date: 04/22/2023  Attending Provider: Cathren Harsh, MD  Primary Diagnosis: Acute diastolic (congestive) heart failure [facility])   Level of care: Telemetry    CROSS COVER NOTE   Date of Service   04/26/2023   Stacey [name], 89 y.o. female, was admitted on 04/22/2023 for Acute diastolic (congestive) heart failure (HCC).    HPI/Events of Note   Borderline BP 83/57 Asymptomatic.  All other vitals hemodynamically stable.   Received lisinopril and metoprolol earlier today, as well as torsemide.   Patient had similar episode during daytime and improved after receiving 250 cc bolus and midodrine.  Last midodrine dose was given at 6 PM.  Orders placed for 250 cc bolus.   RN to Recheck BP after bolus is completed.   Interventions/ Plan   Fluid bolus, 250 cc        Anthoney Harada, DNP, ACNPC- AG [facility] [place]

## 2023-04-26 NOTE — Progress Notes (Signed)
Bladder scan performed at approximately 1600 due to small amount of UOP this shift. Bladder scan shown >840 ml. I & O cath performed under sterile technique, drained 850 ml urine. Pt tolerated with some discomfort which subsided quickly. Dr Isidoro Donning notified and requested to bladder scan 4 hours later. Will pass on to the night shift RN.    Pt allowed full skin assessment when brief needed to be changed from an incontinent episode of urine. Multiple areas of concern found for skin breakdown. Pt has refused repositioning throughout the shift as well yesterday which was noted yesterday. Pt has been extensively educated on the need for repositioning to prevent skin breakdown. Pt has continued to refuse repositioning. Pt has stated the wounds to her left posterior tibial, left 2nd toe, left heel, right heel and right toe are all pre-existing to this admission of which acquired after her pelvis fracture. Pt also stated the sacrum/coccyx was red from time also. Pt stated she has prevalon boots at home which were prescribed by her podiatrist in the past. She has refused to allow new ones to be ordered during this visit and applied. Heel foam dressings, sacrum foam dressing and a foam dressing to the spine were all applied. Pt did finally agree to having a pillow placed under her left hip to aid in offloading of that side. Wound care consulted and all areas of concern found by this RN added to the flowsheets. Will continue to educate and attempt to reposition pt frequently. Darl Pikes NT was instructed by this RN on the need for repositioning this morning as well as Ardianna NT was instructed this afternoon. Both were instructed to document as pt was repositioned or refusal.

## 2023-04-26 NOTE — Progress Notes (Signed)
ANTICOAGULATION CONSULT NOTE - Follow Up Consult  Pharmacy Consult for Coumadin Indication: atrial fibrillation  Allergies  Allergen Reactions   Sulfa Antibiotics Anaphylaxis   Tape Other (See Comments)    BRUISES VERY EASILY!!!!   Codeine Nausea And Vomiting   Hydroxychloroquine Other (See Comments)    GI upset   Shellfish-Derived Products Other (See Comments)    Daughter was unsure of the reaction- "happened years ago" and patient avoids foods that contain shellfish   Latex Rash   Neosporin Original [Bacitracin-Neomycin-Polymyxin] Rash   Polysporin [Bacitracin-Polymyxin B] Rash    Patient Measurements: Height: 5\' 3"  (160 cm) Weight: 42 kg (92 lb 9.5 oz) IBW/kg (Calculated) : 52.4  Vital Signs: Temp: 97.9 F (36.6 C) (09/14 0911) Temp Source: Oral (09/14 0911) BP: 104/63 (09/14 0911) Pulse Rate: 104 (09/14 0911)  Labs: Recent Labs    04/24/23 0359 04/25/23 0420 04/26/23 0501  HGB 11.7* 11.8* 11.6*  HCT 36.8 37.8 37.4  PLT 303 256 272  LABPROT 24.0* 25.2* 25.8*  INR 2.1* 2.3* 2.3*  CREATININE 0.51 0.44 0.70    Estimated Creatinine Clearance: 32.2 mL/min (by C-G formula based on SCr of 0.7 mg/dL).   Assessment:   Patient is an 89 y.o F with hx afib on warfarin PTA who presented to the ED on 04/22/23 with c/o weakness and SOB. Pharmacy has been consulted to resume warfarin on adm.  - PTA warfarin dose: 2.5mg   daily except 5mg  on Thurs per [facility] clinic note  INR 2.3, remains therapeutic CBC: Hgb low/stable at 11.6, Plt 272 No bleeding or complications reported.  No major drug-drug interactions noted.    Goal of Therapy:  INR 2-3 Monitor platelets by anticoagulation protocol: Yes   Plan:  Continue home regimen: 2.5 mg daily except 5mg  on Thursdays. Daily INR   Lynann Beaver PharmD, BCPS [facility] main pharmacy [phone] 04/26/2023 12:43 PM

## 2023-04-27 DIAGNOSIS — I739 Peripheral vascular disease, unspecified: Secondary | ICD-10-CM | POA: Diagnosis not present

## 2023-04-27 DIAGNOSIS — R441 Visual hallucinations: Secondary | ICD-10-CM | POA: Diagnosis not present

## 2023-04-27 DIAGNOSIS — I5031 Acute diastolic (congestive) heart failure: Secondary | ICD-10-CM | POA: Diagnosis not present

## 2023-04-27 DIAGNOSIS — I482 Chronic atrial fibrillation, unspecified: Secondary | ICD-10-CM | POA: Diagnosis not present

## 2023-04-27 LAB — CBC
HCT: 42.9 % (ref 36.0–46.0)
Hemoglobin: 13.1 g/dL (ref 12.0–15.0)
MCH: 29.2 pg (ref 26.0–34.0)
MCHC: 30.5 g/dL (ref 30.0–36.0)
MCV: 95.5 fL (ref 80.0–100.0)
Platelets: 320 10*3/uL (ref 150–400)
RBC: 4.49 MIL/uL (ref 3.87–5.11)
RDW: 15.1 % (ref 11.5–15.5)
WBC: 7 10*3/uL (ref 4.0–10.5)
nRBC: 0 % (ref 0.0–0.2)

## 2023-04-27 LAB — BASIC METABOLIC PANEL
Anion gap: 10 (ref 5–15)
BUN: 31 mg/dL — ABNORMAL HIGH (ref 8–23)
CO2: 29 mmol/L (ref 22–32)
Calcium: 9.1 mg/dL (ref 8.9–10.3)
Chloride: 100 mmol/L (ref 98–111)
Creatinine, Ser: 0.85 mg/dL (ref 0.44–1.00)
GFR, Estimated: >60 mL/min (ref >60)
Glucose, Bld: 136 mg/dL — ABNORMAL HIGH (ref 70–99)
Potassium: 5.1 mmol/L (ref 3.5–5.1)
Sodium: 139 mmol/L (ref 135–145)

## 2023-04-27 LAB — PROTIME-INR
INR: 2.5 — ABNORMAL HIGH (ref 0.8–1.2)
Prothrombin Time: 27.2 s — ABNORMAL HIGH (ref 11.4–15.2)

## 2023-04-27 MED ORDER — MIDODRINE HCL 5 MG PO TABS
5.0000 mg | ORAL_TABLET | Freq: Three times a day (TID) | ORAL | Status: DC
  Administered 2023-04-27 – 2023-04-28 (×3): 5 mg via ORAL
  Filled 2023-04-27 (×3): qty 1

## 2023-04-27 MED ORDER — TRAMADOL HCL 50 MG PO TABS
50.0000 mg | ORAL_TABLET | Freq: Three times a day (TID) | ORAL | Status: DC | PRN
  Administered 2023-04-27 – 2023-04-30 (×5): 50 mg via ORAL
  Filled 2023-04-27 (×5): qty 1

## 2023-04-27 MED ORDER — METOPROLOL TARTRATE 5 MG/5ML IV SOLN
5.0000 mg | Freq: Once | INTRAVENOUS | Status: DC
Start: 2023-04-27 — End: 2023-04-27

## 2023-04-27 MED ORDER — METOPROLOL TARTRATE 25 MG PO TABS
25.0000 mg | ORAL_TABLET | Freq: Two times a day (BID) | ORAL | Status: DC
  Administered 2023-04-27 – 2023-04-28 (×4): 25 mg via ORAL
  Filled 2023-04-27 (×4): qty 1

## 2023-04-27 MED ORDER — METOPROLOL TARTRATE 5 MG/5ML IV SOLN
5.0000 mg | Freq: Once | INTRAVENOUS | Status: AC
  Administered 2023-04-27: 5 mg via INTRAVENOUS

## 2023-04-27 MED ORDER — METOPROLOL TARTRATE 5 MG/5ML IV SOLN
INTRAVENOUS | Status: AC
  Filled 2023-04-27: qty 5

## 2023-04-27 MED ORDER — MAGNESIUM SULFATE 2 GM/50ML IV SOLN
2.0000 g | Freq: Once | INTRAVENOUS | Status: AC
  Administered 2023-04-27: 2 g via INTRAVENOUS
  Filled 2023-04-27: qty 50

## 2023-04-27 NOTE — Progress Notes (Signed)
OT Cancellation Note  Patient Details Name: Stacey [name] MRN: [id number] DOB: 1933-02-27   Cancelled Treatment:    Reason Eval/Treat Not Completed: Patient declined, no reason specified Patient declining to participate in therapy reporting that she already moved today. Patient was not receptive to education on movement more than once during the day. Patients HR noted to flash up to 150 bpm with discussion in bed. Nurse made aware. OT to continue to follow and check back another time.  Rosalio Loud, MS Acute Rehabilitation Department Office# [phone]  04/27/2023, 11:15 AM

## 2023-04-27 NOTE — Progress Notes (Signed)
ANTICOAGULATION CONSULT NOTE - Follow Up Consult  Pharmacy Consult for Warfarin Indication: atrial fibrillation  Allergies  Allergen Reactions   Sulfa Antibiotics Anaphylaxis   Tape Other (See Comments)    BRUISES VERY EASILY!!!!   Codeine Nausea And Vomiting   Hydroxychloroquine Other (See Comments)    GI upset   Shellfish-Derived Products Other (See Comments)    Daughter was unsure of the reaction- "happened years ago" and patient avoids foods that contain shellfish   Latex Rash   Neosporin Original [Bacitracin-Neomycin-Polymyxin] Rash   Polysporin [Bacitracin-Polymyxin B] Rash    Patient Measurements: Height: 5\' 3"  (160 cm) Weight: 39.9 kg (87 lb 15.4 oz) IBW/kg (Calculated) : 52.4  Vital Signs: Temp: 97.3 F (36.3 C) (09/15 0808) Temp Source: Oral (09/15 0808) BP: 126/91 (09/15 0400) Pulse Rate: 120 (09/15 0429)  Labs: Recent Labs    04/25/23 0420 04/26/23 0501 04/27/23 0356  HGB 11.8* 11.6* 13.1  HCT 37.8 37.4 42.9  PLT 256 272 320  LABPROT 25.2* 25.8* 27.2*  INR 2.3* 2.3* 2.5*  CREATININE 0.44 0.70 0.85    Estimated Creatinine Clearance: 28.8 mL/min (by C-G formula based on SCr of 0.85 mg/dL).   Assessment:   Patient is an 89 y.o F with hx afib on warfarin PTA who presented to the ED on 04/22/23 with c/o weakness and SOB. Pharmacy has been consulted to resume warfarin on adm.  - PTA warfarin dose: 2.5mg   daily except 5mg  on Thurs per [facility] clinic note  INR 2.5, remains therapeutic CBC: Hgb increased to 13.1, Plt WNL No bleeding or complications reported.  No major drug-drug interactions noted.    Goal of Therapy:  INR 2-3 Monitor platelets by anticoagulation protocol: Yes   Plan:  Continue home regimen: 2.5 mg daily except 5mg  on Thursdays. Daily INR   Lynann Beaver PharmD, BCPS [facility] main pharmacy [phone] 04/27/2023 12:47 PM

## 2023-04-27 NOTE — Progress Notes (Signed)
Progress Note  Patient Name: Labreeska Finberg Date of Encounter: 04/27/2023 Primary Cardiologist: Maisie Fus, MD   Subjective   Found to have relative adrenal insufficiency given steroids with TRH With this, she now has new, asymptomatic RVR. Notes minimal urine output since diuretics stopped for hypotension  Vital Signs    Vitals:   04/27/23 0421 04/27/23 0423 04/27/23 0428 04/27/23 0429  BP:      Pulse:    (!) 120  Resp:   19   Temp: 97.8 F (36.6 C)     TempSrc:      SpO2:   96%   Weight:  39.9 kg    Height:        Intake/Output Summary (Last 24 hours) at 04/27/2023 0750 Last data filed at 04/27/2023 0434 Gross per 24 hour  Intake 683.26 ml  Output 1275 ml  Net -591.74 ml   Filed Weights   04/25/23 0500 04/26/23 0450 04/27/23 0423  Weight: 41.1 kg 42 kg 39.9 kg    Physical Exam   GEN: No acute distress.  Notable kyphoscoliosis Neck: No JVD Cardiac: IRIR tachycardia, no murmurs, rubs, or gallops.  Respiratory: CTAB, no tachypnea GI: Soft, nontender, non-distended  MS: Edema has resolved  Labs   Telemetry AF RVR   Chemistry Recent Labs  Lab 04/25/23 0420 04/26/23 0501 04/27/23 0356  NA 134* 138 139  K 4.3 3.3* 5.1  CL 100 98 100  CO2 26 29 29   GLUCOSE 117* 101* 136*  BUN 14 22 31*  CREATININE 0.44 0.70 0.85  CALCIUM 9.1 8.5* 9.1  GFRNONAA >60 >60 >60  ANIONGAP 8 11 10      Hematology Recent Labs  Lab 04/25/23 0420 04/26/23 0501 04/27/23 0356  WBC 7.8 7.4 7.0  RBC 4.00 3.93 4.49  HGB 11.8* 11.6* 13.1  HCT 37.8 37.4 42.9  MCV 94.5 95.2 95.5  MCH 29.5 29.5 29.2  MCHC 31.2 31.0 30.5  RDW 15.4 15.4 15.1  PLT 256 272 320    Cardiac EnzymesNo results for input(s): "TROPONINI" in the last 168 hours. No results for input(s): "TROPIPOC" in the last 168 hours.   BNP Recent Labs  Lab 04/22/23 1600 04/26/23 0501  BNP 636.3* 154.7*     DDimer No results for input(s): "DDIMER" in the last 168 hours.   Cardiac Studies    Cardiac Studies & Procedures     STRESS TESTS  MYOCARDIAL PERFUSION IMAGING 11/05/2012   ECHOCARDIOGRAM  ECHOCARDIOGRAM COMPLETE 04/23/2023  Narrative ECHOCARDIOGRAM REPORT    Patient Name:   [facility] MAE Scalera Date of Exam: 04/23/2023 Medical Rec #:  [id number]           Height:       63.0 in Accession #:    [id number]          Weight:       93.9 lb Date of Birth:  05/02/1933           BSA:          1.402 m Patient Age:    89 years            BP:           126/64 mmHg Patient Gender: F                   HR:           109 bpm. Exam Location:  Inpatient  Procedure: 2D Echo, Cardiac Doppler and Color Doppler  Indications:  CHF-Acute Diastolic I50.31  History:        Patient has prior history of Echocardiogram examinations, most recent 06/10/2018. CHF, Arrythmias:Atrial Fibrillation; Risk Factors:Former Smoker and Dyslipidemia.  Sonographer:    Aron Baba Referring Phys: [id number] MIR M [facility]   Sonographer Comments: Suboptimal subcostal window. IMPRESSIONS   1. Left ventricular ejection fraction, by estimation, is 60 to 65%. The left ventricle has normal function. The left ventricle has no regional wall motion abnormalities. Left ventricular diastolic parameters are indeterminate. 2. Right ventricular systolic function is mildly reduced. The right ventricular size is moderately enlarged. 3. Left atrial size was severely dilated. 4. Right atrial size was severely dilated. 5. The mitral valve is myxomatous. Moderate mitral valve regurgitation. 6. Tricuspid valve regurgitation is severe. 7. There is moderate calcification of the aortic valve. Aortic valve regurgitation is not visualized. 8. The inferior vena cava is normal in size with greater than 50% respiratory variability, suggesting right atrial pressure of 3 mmHg.  FINDINGS Left Ventricle: Left ventricular ejection fraction, by estimation, is 60 to 65%. The left ventricle has normal function. The left  ventricle has no regional wall motion abnormalities. The left ventricular internal cavity size was normal in size. There is no left ventricular hypertrophy. Left ventricular diastolic parameters are indeterminate.  Right Ventricle: The right ventricular size is moderately enlarged. Right ventricular systolic function is mildly reduced.  Left Atrium: Left atrial size was severely dilated.  Right Atrium: Right atrial size was severely dilated.  Pericardium: There is no evidence of pericardial effusion.  Mitral Valve: The mitral valve is myxomatous. Moderate mitral valve regurgitation.  Tricuspid Valve: Tricuspid valve regurgitation is severe.  Aortic Valve: There is moderate calcification of the aortic valve. Aortic valve regurgitation is not visualized.  Pulmonic Valve: Pulmonic valve regurgitation is not visualized.  Aorta: The aortic root and ascending aorta are structurally normal, with no evidence of dilitation.  Venous: The inferior vena cava is normal in size with greater than 50% respiratory variability, suggesting right atrial pressure of 3 mmHg.  IAS/Shunts: The interatrial septum was not well visualized.   LEFT VENTRICLE PLAX 2D LVIDd:         3.50 cm   Diastology LVIDs:         2.10 cm   LV e' medial:    6.64 cm/s LV PW:         0.90 cm   LV E/e' medial:  15.8 LV IVS:        0.50 cm   LV e' lateral:   11.40 cm/s LVOT diam:     1.80 cm   LV E/e' lateral: 9.2 LV SV:         25 LV SV Index:   18 LVOT Area:     2.54 cm   RIGHT VENTRICLE RV S prime:     12.90 cm/s TAPSE (M-mode): 0.7 cm  LEFT ATRIUM           Index        RIGHT ATRIUM           Index LA diam:      4.90 cm 3.49 cm/m   RA Area:     35.10 cm LA Vol (A2C): 24.2 ml 17.26 ml/m  RA Volume:   142.00 ml 101.26 ml/m LA Vol (A4C): 80.9 ml 57.69 ml/m AORTIC VALVE LVOT Vmax:   50.30 cm/s LVOT Vmean:  35.400 cm/s LVOT VTI:    0.097 m  AORTA Ao Root diam: 3.20 cm Ao Asc  diam:  3.10 cm  MITRAL VALVE                  TRICUSPID VALVE MV Area (PHT): 6.54 cm      TR Peak grad:   49.8 mmHg MV Decel Time: 116 msec      TR Vmax:        353.00 cm/s MR Peak grad:   95.8 mmHg MR Mean grad:   61.0 mmHg    SHUNTS MR Vmax:        489.50 cm/s  Systemic VTI:  0.10 m MR Vmean:       373.0 cm/s   Systemic Diam: 1.80 cm MR PISA:        0.57 cm MR PISA Radius: 0.30 cm MV E velocity: 105.00 cm/s  Halford Decamp signed by Carolan Clines Signature Date/Time: 04/23/2023/2:33:28 PM    Final                Assessment & Plan   Long Standing Persistent Atrial Fibrillation - Risk factors include age, gender, HF, and HTN, with vascular disease - CHADSVASC=6. - TSH WNL - complicated by adrenal insufficiency, with steroids now has RVR - her metoprolol was increased; I agree - I will give 2 g IV Magnesium - she is asymptomatic; will be conservative with management (return to 50 mg PO BID if RVR this PM) due to lack of symptoms and risk of hypotension if we are aggressive - on history coumadin, will not change at this time  Heart Failure Presereved Ejection Fraction (diastolic) - acute on chronic on admission - NYHA class IV, Stage C, euvolemic - Diuretic regimen: diuretics held, will need at least torsemide 20 mg at DC [facility] evaluating for Addison's component and giving low dose steroids - Stop ACEi going forward - discussed with TRH, plan to wean midodrine tomorrow - high risk of UTI, deferred SGLT2i;  with increase spironolactone from 12.5 to 25 she has asymptomatic hypotension   PAD with bilateral subclavian stenosis - seen by [facility] - continue home statin  Updated eldest daugther, [name]  For questions or updates, please contact [facility] Please consult [email or web address] for contact info under Cardiology/STEMI.      Riley Lam, MD FASE [facility] Cardiologist [facility]  [address] [place], [address] [place], [place] [address] [phone]  7:50 AM

## 2023-04-27 NOTE — Progress Notes (Signed)
[facility]                                                                              Stacey [name], is a 89 y.o. female, DOB - 1933-07-28, ZOX:[id number] Admit date - 04/22/2023    Outpatient Primary MD for the patient is Stacey Saint, MD  LOS - 5  days  Chief Complaint  Patient presents with   Weakness   Shortness of Breath       Brief summary   Patient is a 89 year old female with A-fib on warfarin, diastolic CHF, RA, GERD, HLD presented with exertional dyspnea, weakness.  She was on diuretics in the past however per her daughter, was discontinued when she was in the rehab due to potential kidney problems.  She has been on Lasix and torsemide.  Patient was noted to be short of breath and having exertional dyspnea, no cough, fevers, chest pain. Sodium 134, normal renal function, BNP 636, chest x-ray with moderate pleural effusions and vascular congestion.  Patient was admitted for further workup.   Assessment & Plan    Principal Problem: Acute diastolic (congestive) heart failure (HCC) Moderate pleural effusions -Presented with dyspnea, lower extremity edema, pleural effusions, elevated BNP -Follows Dr. Carolan Clines outpatient -2D echo showed EF of 60 to 65%, no regional WMA, right ventricular systolic function mildly reduced severe TR, moderate MR -Patient was placed on IV Lasix for diuresis however due to hypotension, diuresis has been challenging -This morning noted to be in atrial fibrillation with RVR, received Lopressor 5 mg IV x 1, increased metoprolol to 25 mg twice daily -BP has been stable, taper midodrine down to 5 mg 3 times daily Currently negative balance of 5.6 L, no acute shortness of breath, will place on torsemide 20 mg at discharge  Active Problems:   Chronic atrial fibrillation (HCC), persistent, with RVR -Patient has been on warfarin, will continue, defer to cardiology regarding anticoagulation -Titrate up metoprolol, resumed to  25 mg twice daily, Lopressor 5 mg IV x 1 this morning (outpatient on 50 mg twice daily).  Mild chronic hyponatremia -Has chronic hyponatremia, sodium 132 on 03/16/2023 -Stable  Hypokalemia -Replaced    PVD (peripheral vascular disease) (HCC) -Continue statin, warfarin, has been seen by VVS  History of rheumatoid arthritis -On chronic prednisone 5 mg p.o. daily -Random cortisol level 12.2 wnl, s/p 50mg  of IV hydrocortisone x2 for possibility of relative adrenal insufficiency due to chronic steroid use -Outpatient follow-up with her rheumatologist    Hallucinations, visual, ?  Vertigo -Has been ongoing for over a month, with visual hallucinations -Has outpatient ophthalmology appointment -UA negative for UTI, MRI brain showed no acute intracranial abnormality, -B1 pending, B12, folate WNL  TSH normal 1.2 -Per patient, no further hallucinations  Underweight Estimated body mass index is 15.58 kg/m as calculated from the following:   Height as of this encounter: 5\' 3"  (1.6 m).   Weight as of this encounter: 39.9 kg.  Code Status: DNR DVT Prophylaxis:   warfarin (COUMADIN) tablet 2.5 mg  warfarin (COUMADIN) tablet 5 mg   Level of Care: Level of care: Telemetry Family Communication: Updated patient's daughter,  [name] on the phone   Disposition Plan:      Remains inpatient appropriate:    Procedures:  None  Consultants:   Cardiology  Antimicrobials:   Anti-infectives (From admission, onward)    None          Medications  atorvastatin  10 mg Oral Daily   feeding supplement  237 mL Oral BID BM   gabapentin  400 mg Oral Daily   metoprolol tartrate  25 mg Oral BID   midodrine  5 mg Oral TID WC   pantoprazole  40 mg Oral Daily   predniSONE  5 mg Oral Q breakfast   sertraline  100 mg Oral Daily   sodium chloride flush  3 mL Intravenous Q12H   warfarin  2.5 mg Oral Once per day on Sunday Monday Tuesday Wednesday Friday Saturday   warfarin  5 mg Oral Every Thursday    Warfarin - Pharmacist Dosing Inpatient   Does not apply q1600      Subjective:   Cindie Daigler was seen and examined today.  This morning noted to be in RVR, heart rate 140's-150.  Asymptomatic, no dizziness.  No acute chest pain, shortness of breath, fever chills or any pain.   Objective:   Vitals:   04/27/23 0423 04/27/23 0428 04/27/23 0429 04/27/23 0808  BP:      Pulse:   (!) 120   Resp:  19    Temp:    (!) 97.3 F (36.3 C)  TempSrc:    Oral  SpO2:  96%    Weight: 39.9 kg     Height:        Intake/Output Summary (Last 24 hours) at 04/27/2023 1405 Last data filed at 04/27/2023 0900 Gross per 24 hour  Intake 913.26 ml  Output 1275 ml  Net -361.74 ml     Wt Readings from Last 3 Encounters:  04/27/23 39.9 kg  04/07/23 40.4 kg  03/28/23 40.5 kg    Physical Exam General: Alert and oriented x 3, NAD Cardiovascular: Irregularly irregular, tachycardia Respiratory: CTAB, no wheezing Gastrointestinal: Soft, nontender, nondistended, NBS Ext: no pedal edema bilaterally Neuro: no new deficits Psych: Normal affect    Data Reviewed:  I have personally reviewed following labs    CBC Lab Results  Component Value Date   WBC 7.0 04/27/2023   RBC 4.49 04/27/2023   HGB 13.1 04/27/2023   HCT 42.9 04/27/2023   MCV 95.5 04/27/2023   MCH 29.2 04/27/2023   PLT 320 04/27/2023   MCHC 30.5 04/27/2023   RDW 15.1 04/27/2023   LYMPHSABS 0.3 (L) 04/22/2023   MONOABS 0.4 04/22/2023   EOSABS 0.0 04/22/2023   BASOSABS 0.0 04/22/2023     Last metabolic panel Lab Results  Component Value Date   NA 139 04/27/2023   K 5.1 04/27/2023   CL 100 04/27/2023   CO2 29 04/27/2023   BUN 31 (H) 04/27/2023   CREATININE 0.85 04/27/2023   GLUCOSE 136 (H) 04/27/2023   GFRNONAA >60 04/27/2023   GFRAA 64 11/24/2019   CALCIUM 9.1 04/27/2023   PROT 6.0 (L) 03/13/2023   ALBUMIN 3.2 (L) 03/13/2023   BILITOT 1.3 (H) 03/13/2023   ALKPHOS 72 03/13/2023   AST 34 03/13/2023   ALT 24  03/13/2023   ANIONGAP 10 04/27/2023    CBG (last 3)  No results for input(s): "GLUCAP" in the last 72 hours.    Coagulation Profile: Recent Labs  Lab 04/23/23 0729 04/24/23 0359 04/25/23 0420 04/26/23 0501 04/27/23 0356  INR 2.1* 2.1* 2.3* 2.3* 2.5*     Radiology Studies: I have personally reviewed the imaging studies  No results found.     Thad Ranger M.D. [facility] 04/27/2023, 2:05 PM  Available via Epic secure chat 7am-7pm After 7 pm, please refer to night coverage provider listed on amion.

## 2023-04-28 DIAGNOSIS — J810 Acute pulmonary edema: Secondary | ICD-10-CM | POA: Diagnosis not present

## 2023-04-28 DIAGNOSIS — I5031 Acute diastolic (congestive) heart failure: Secondary | ICD-10-CM | POA: Diagnosis not present

## 2023-04-28 DIAGNOSIS — R441 Visual hallucinations: Secondary | ICD-10-CM | POA: Diagnosis not present

## 2023-04-28 DIAGNOSIS — I482 Chronic atrial fibrillation, unspecified: Secondary | ICD-10-CM | POA: Diagnosis not present

## 2023-04-28 LAB — CBC
HCT: 35.9 % — ABNORMAL LOW (ref 36.0–46.0)
Hemoglobin: 11.1 g/dL — ABNORMAL LOW (ref 12.0–15.0)
MCH: 30.2 pg (ref 26.0–34.0)
MCHC: 30.9 g/dL (ref 30.0–36.0)
MCV: 97.6 fL (ref 80.0–100.0)
Platelets: 229 10*3/uL (ref 150–400)
RBC: 3.68 MIL/uL — ABNORMAL LOW (ref 3.87–5.11)
RDW: 15.2 % (ref 11.5–15.5)
WBC: 4.6 10*3/uL (ref 4.0–10.5)
nRBC: 0 % (ref 0.0–0.2)

## 2023-04-28 LAB — BASIC METABOLIC PANEL
Anion gap: 7 (ref 5–15)
BUN: 26 mg/dL — ABNORMAL HIGH (ref 8–23)
CO2: 28 mmol/L (ref 22–32)
Calcium: 8.8 mg/dL — ABNORMAL LOW (ref 8.9–10.3)
Chloride: 101 mmol/L (ref 98–111)
Creatinine, Ser: 0.63 mg/dL (ref 0.44–1.00)
GFR, Estimated: >60 mL/min (ref >60)
Glucose, Bld: 99 mg/dL (ref 70–99)
Potassium: 3.9 mmol/L (ref 3.5–5.1)
Sodium: 136 mmol/L (ref 135–145)

## 2023-04-28 LAB — PROTIME-INR
INR: 2.8 — ABNORMAL HIGH (ref 0.8–1.2)
Prothrombin Time: 29.5 s — ABNORMAL HIGH (ref 11.4–15.2)

## 2023-04-28 LAB — VITAMIN B1: Vitamin B1 (Thiamine): 164.6 nmol/L (ref 66.5–200.0)

## 2023-04-28 MED ORDER — MIDODRINE HCL 5 MG PO TABS
5.0000 mg | ORAL_TABLET | Freq: Two times a day (BID) | ORAL | Status: DC
  Administered 2023-04-28: 5 mg via ORAL
  Filled 2023-04-28: qty 1

## 2023-04-28 NOTE — Progress Notes (Signed)
Physical Therapy Treatment Patient Details Name: Stacey [name] MRN: [id number] DOB: [birth year] Today's Date: 04/28/2023   History of Present Illness 89 year old female with A-fib on warfarin, diastolic CHF, RA, GERD, HLD presented with exertional dyspnea, weakness.  She was on diuretics in the past however per her daughter, was discontinued when she was in the rehab due to potential kidney problems.  She has been on Lasix and torsemide.  Patient was noted to be short of breath and having exertional dyspnea, chest x-ray with moderate pleural effusions and vascular congestion. Pt had recent admission 8/1-03/18/23 with a fall, L superior pubic rami fx, B rib fxs, and compression fxs at T10, T11, L5.    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 Assisted OOB while monitoring vitals.  Pt self able to transfer to EOB.  General transfer comment: self able with increased time.  Pt is cautious.  Good use of hands to steady self.  Poor kyphotic posture.  Pt slow but steady. General Gait Details: t was able to amb 15 feet with her personal walker at Contact [employer] Asisstwith slow but steady steps.  Poor Kyphotic posture.  HR increased from 119 to 143.  RA avg 96%.  Pt c/o feeling "a little dizzy".  "That new medicine that have me on", stated pt. Pt plans to return home with family support.  LPT has rec HH PT and NO equipment as pt has multiple walker types.    If plan is discharge home, recommend the following: A little help with walking and/or transfers;A little help with bathing/dressing/bathroom;Assistance with cooking/housework;Assist for transportation;Help with stairs or ramp for entrance   Can travel by private vehicle        Equipment Recommendations  None recommended by PT    Recommendations for Other Services       Precautions / Restrictions Precautions Precautions:  Fall Restrictions Weight Bearing Restrictions: No Other Position/Activity Restrictions: kyphotic     Mobility  Bed Mobility Overal bed mobility: Modified Independent             General bed mobility comments: self able with increased time    Transfers Overall transfer level: Needs assistance Equipment used: Rolling walker (2 wheels) Transfers: Sit to/from Stand Sit to Stand: Supervision, Contact guard assist           General transfer comment: self able with increased time.  Pt is cautious.  Good use of hands to steady self.  Poor kyphotic posture.  Pt slow but steady.    Ambulation/Gait Ambulation/Gait assistance: Supervision, Contact guard assist Gait Distance (Feet): 15 Feet Assistive device: Rolling walker (2 wheels) Gait Pattern/deviations: Step-through pattern, Trunk flexed, Decreased stride length Gait velocity: decr     General Gait Details: t was able to amb 15 feet with her personal walker at Contact Guard Asisstwith slow but steady steps.  Poor Kyphotic posture.  HR increased from 119 to 143.  RA avg 96%.  Pt c/o feeling "a little dizzy".  "That new medicine that have me on", stated pt.   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        Exercises      General Comments        Pertinent Vitals/Pain Pain Assessment Pain Assessment: No/denies pain    Home Living                          Prior Function            PT Goals (current goals can now be found in the  care plan section) Progress towards PT goals: Progressing toward goals    Frequency    Min 1X/week      PT Plan      Co-evaluation              AM-PAC PT "6 Clicks" Mobility   Outcome Measure  Help needed turning from your back to your side while in a flat bed without using bedrails?: None Help needed moving from lying on your back to sitting on the side of a flat bed without using bedrails?: None Help needed moving to and from a bed to a chair (including a wheelchair)?: A Little Help needed standing up from a chair using your arms (e.g., wheelchair or bedside chair)?: A Little Help needed to walk in hospital room?: A Little Help needed climbing 3-5 steps with a railing? : A Little 6 Click Score: 20    End of Session Equipment Utilized During Treatment: Gait belt Activity Tolerance: Patient tolerated treatment well Patient left: in bed;with call bell/phone within reach;with nursing/sitter in room Nurse Communication: Mobility status PT Visit Diagnosis: Other abnormalities of gait and mobility (R26.89);Difficulty in walking, not elsewhere classified (R26.2)     Time: 1644-1700 PT Time Calculation (min) (ACUTE ONLY): 16 min  Charges:    $Gait Training: 8-22 mins PT General Charges $$ ACUTE PT VISIT: 1 Visit                    Felecia Shelling  PTA Acute  Rehabilitation Services Office M-F          [phone]

## 2023-04-28 NOTE — Progress Notes (Signed)
OT Cancellation Note  Patient Details Name: Stacey [name] MRN: [id number] DOB: 01-25-1933   Cancelled Treatment:    Reason Eval/Treat Not Completed: Fatigue/lethargy limiting ability to participate Patient is in bed resting at this time did not wake with knock at door HR still indicated A fib on monitor. OT to continue to follow and check back as schedule will allow.  Rosalio Loud, MS Acute Rehabilitation Department Office# [phone]  04/28/2023, 2:03 PM

## 2023-04-28 NOTE — Progress Notes (Signed)
   Patient Name: Stacey [name] Date of Encounter: 04/28/2023 [facility] Cardiologist: Maisie Fus, MD   Interval Summary  .    Patient reports fatigue from not sleeping well but is otherwise doing very well today. No chest pain, shortness of breath, palpitations.   Vital Signs .    Vitals:   04/27/23 1600 04/27/23 2052 04/27/23 2152 04/28/23 0500  BP: 122/69 101/68 106/68   Pulse: 95 84 97 75  Resp: 19 20    Temp:  98.6 F (37 C)  97.9 F (36.6 C)  TempSrc:  Oral  Oral  SpO2: 94% 95%  98%  Weight:    43.3 kg  Height:        Intake/Output Summary (Last 24 hours) at 04/28/2023 1106 Last data filed at 04/28/2023 0851 Gross per 24 hour  Intake 170 ml  Output 900 ml  Net -730 ml      04/28/2023    5:00 AM 04/27/2023    4:23 AM 04/26/2023    4:50 AM  Last 3 Weights  Weight (lbs) 95 lb 7.4 oz 87 lb 15.4 oz 92 lb 9.5 oz  Weight (kg) 43.3 kg 39.9 kg 42 kg      Telemetry/ECG    Persistent afib. Ventricular rates 80s-90s this morning - Personally Reviewed  Physical Exam .   GEN: No acute distress.   Neck: No JVD Cardiac: Irregularly irregular, no murmurs, rubs, or gallops.  Respiratory: Clear to auscultation bilaterally. GI: Soft, nontender, non-distended  MS: No edema  Assessment & Plan .     Persistent Atrial fibrillation CHADSVASC=6   Patient with recurrent RVR this admission, complicated by adrenal insufficiency and steroid administration. Fortunately asymptomatic.  Patient currently on Metoprolol Tartrate 25mg  BID with rates relatively well controlled. BP is much improved today so there is room to increase Metoprolol to 50mg  BID if needed.  Continue Warfarin (long-term use).  Acute on chronic HFpEF  Patient admitted with symptoms of shortness of breath/dyspnea on exertion after Torsemide and MRA held by rehab physician. BNP elevated on admission with LE edema. TTE with LVEF 60-65%, severe TR, moderate MR.   Patient now euvolemic following  IP diuresis, though use of diuretics complicated by hypotension and patient placed on Midodrine. Now down to 5mg  TID. Given that she became hypervolemic while completely off Torsemide in outpatient setting, recommend daily dose at DC, Torsemide 20mg .  No SGLT2 given UTI risk MRA use limited by BP earlier this admission. Would favor resuming given significantly improved BP.   PVD  Continue daily statin  Per primary team:  Adrenal insufficiency RA  For questions or updates, please contact [facility] Please consult [email or web address] for contact info under        Signed, Perlie Gold, PA-C

## 2023-04-28 NOTE — Consult Note (Signed)
WOC Nurse Consult Note: Reason for Consult: multiple wounds Wound type: Deep Tissue Pressure Injury; left heel; 0.5cm x 2cm x 0cm; 100% dark purple, non blanchable, not open Healed area on the right heel, redness noted on the sacrum that does blanch currently, redness over the thoracic vertebrae that also blanches  Patient self reports heel ulcers for "about an year"  Pressure Injury POA: Yes Measurement: see above  Wound bed:see above  Drainage (amount, consistency, odor) none Periwound: intact  Dressing procedure/placement/frequency:  Continue silicone foam to the affected at risk areas per the skin care order set.  Add Prevalon boots bilaterally offloading at all times. Order per unit secretary   Discussed POC with patient and bedside nurse.  Re consult if needed, will not follow at this time. Thanks  Omarion Minnehan [employer], RN,CWOCN, CNS, CWON-AP [phone])

## 2023-04-28 NOTE — Plan of Care (Signed)

## 2023-04-28 NOTE — Progress Notes (Signed)
[facility]                                                                              Stacey [name], is a 89 y.o. female, DOB - Feb 10, 1933, OZH:[id number] Admit date - 04/22/2023    Outpatient Primary MD for the patient is [name], MD  LOS - 6  days  Chief Complaint  Patient presents with   Weakness   Shortness of Breath       Brief summary   Patient is a 89 year old female with A-fib on warfarin, diastolic CHF, RA, GERD, HLD presented with exertional dyspnea, weakness.  She was on diuretics in the past however per her daughter, was discontinued when she was in the rehab due to potential kidney problems.  She has been on Lasix and torsemide.  Patient was noted to be short of breath and having exertional dyspnea, no cough, fevers, chest pain. Sodium 134, normal renal function, BNP 636, chest x-ray with moderate pleural effusions and vascular congestion.  Patient was admitted for further workup.   Assessment & Plan    Principal Problem: Acute diastolic (congestive) heart failure (HCC) Moderate pleural effusions -Presented with dyspnea, lower extremity edema, pleural effusions, elevated BNP -Follows Dr. Carolan Clines outpatient -2D echo showed EF of 60 to 65%, no regional WMA, right ventricular systolic function mildly reduced severe TR, moderate MR -Patient was placed on IV Lasix for diuresis however due to hypotension, diuresis has been challenging -Metoprolol resumed on 9/15, heart rate controlled -BP improving, decrease midodrine down to 5 mg twice daily, will wean off tomorrow if BP remains stable -Negative balance of 6.3 L, fatigued but no chest pain or shortness of breath.  Overall feeling better -Per cardiology DC on torsemide 20 mg daily, outpatient follow-up on 05/13/2023   Active Problems:   Chronic atrial fibrillation (HCC), persistent, with RVR -Patient has been on warfarin, defer to cardiology regarding anticoagulation -Resumed  metoprolol, continue 25 mg twice daily, if BP improves, will resume the outpatient dose of 50 mg twice daily  Mild chronic hyponatremia -Has chronic hyponatremia, sodium 132 on 03/16/2023 -Stable  Hypokalemia -Replaced    PVD (peripheral vascular disease) (HCC) -Continue statin, warfarin, has been seen by [facility]  History of rheumatoid arthritis -On chronic prednisone 5 mg p.o. daily -Random cortisol level 12.2 wnl, s/p 50mg  of IV hydrocortisone x2 for possibility of relative adrenal insufficiency due to chronic steroid use -Outpatient follow-up with her rheumatologist    Hallucinations, visual, ?  Vertigo -Has been ongoing for over a month, with visual hallucinations -Has outpatient ophthalmology appointment -UA negative for UTI, MRI brain showed no acute intracranial abnormality, -B1 pending, B12, folate WNL  TSH normal 1.2 -Per patient, no further hallucinations  Underweight Estimated body mass index is 16.91 kg/m as calculated from the following:   Height as of this encounter: 5\' 3"  (1.6 m).   Weight as of this encounter: 43.3 kg.  Code Status: DNR DVT Prophylaxis:   warfarin (COUMADIN) tablet 2.5 mg  warfarin (COUMADIN) tablet 5 mg   Level of Care: Level of care: Telemetry Family Communication: Updated patient's daughter, [name] on the phone on  9/15 Disposition Plan:      Remains inpatient appropriate: Plan on DC home tomorrow   Procedures:  None  Consultants:   Cardiology  Antimicrobials:   Anti-infectives (From admission, onward)    None          Medications  atorvastatin  10 mg Oral Daily   feeding supplement  237 mL Oral BID BM   gabapentin  400 mg Oral Daily   metoprolol tartrate  25 mg Oral BID   midodrine  5 mg Oral BID WC   pantoprazole  40 mg Oral Daily   predniSONE  5 mg Oral Q breakfast   sertraline  100 mg Oral Daily   sodium chloride flush  3 mL Intravenous Q12H   warfarin  2.5 mg Oral Once per day on Sunday Monday Tuesday Wednesday  Friday Saturday   warfarin  5 mg Oral Every Thursday   Warfarin - Pharmacist Dosing Inpatient   Does not apply q1600      Subjective:   Stacey [name] was seen and examined today.  Tired but overall feeling better, no chest pain or shortness of breath, heart rate much better controlled in 70-80s.  BP improving.  No dizziness lightheadedness, has not slept well.  Objective:   Vitals:   04/27/23 1600 04/27/23 2052 04/27/23 2152 04/28/23 0500  BP: 122/69 101/68 106/68   Pulse: 95 84 97 75  Resp: 19 20    Temp:  98.6 F (37 C)  97.9 F (36.6 C)  TempSrc:  Oral  Oral  SpO2: 94% 95%  98%  Weight:    43.3 kg  Height:        Intake/Output Summary (Last 24 hours) at 04/28/2023 1351 Last data filed at 04/28/2023 0851 Gross per 24 hour  Intake 170 ml  Output 900 ml  Net -730 ml     Wt Readings from Last 3 Encounters:  04/28/23 43.3 kg  04/07/23 40.4 kg  03/28/23 40.5 kg   Physical Exam General: Alert and oriented x 3, NAD Cardiovascular: ireg ireg Respiratory: CTAB, no wheezing Gastrointestinal: Soft, nontender, nondistended, NBS Ext: no pedal edema bilaterally Neuro: no new deficits Psych: Normal affect    Data Reviewed:  I have personally reviewed following labs    CBC Lab Results  Component Value Date   WBC 4.6 04/28/2023   RBC 3.68 (L) 04/28/2023   HGB 11.1 (L) 04/28/2023   HCT 35.9 (L) 04/28/2023   MCV 97.6 04/28/2023   MCH 30.2 04/28/2023   PLT 229 04/28/2023   MCHC 30.9 04/28/2023   RDW 15.2 04/28/2023   LYMPHSABS 0.3 (L) 04/22/2023   MONOABS 0.4 04/22/2023   EOSABS 0.0 04/22/2023   BASOSABS 0.0 04/22/2023     Last metabolic panel Lab Results  Component Value Date   NA 136 04/28/2023   K 3.9 04/28/2023   CL 101 04/28/2023   CO2 28 04/28/2023   BUN 26 (H) 04/28/2023   CREATININE 0.63 04/28/2023   GLUCOSE 99 04/28/2023   GFRNONAA >60 04/28/2023   GFRAA 64 11/24/2019   CALCIUM 8.8 (L) 04/28/2023   PROT 6.0 (L) 03/13/2023   ALBUMIN 3.2 (L)  03/13/2023   BILITOT 1.3 (H) 03/13/2023   ALKPHOS 72 03/13/2023   AST 34 03/13/2023   ALT 24 03/13/2023   ANIONGAP 7 04/28/2023    CBG (last 3)  No results for input(s): "GLUCAP" in the last 72 hours.    Coagulation Profile: Recent Labs  Lab 04/24/23 0359 04/25/23 0420 04/26/23 0501 04/27/23  4098 04/28/23 0417  INR 2.1* 2.3* 2.3* 2.5* 2.8*     Radiology Studies: I have personally reviewed the imaging studies  No results found.     Thad Ranger M.D. [facility] 04/28/2023, 1:51 PM  Available via Epic secure chat 7am-7pm After 7 pm, please refer to night coverage provider listed on amion.

## 2023-04-28 NOTE — Progress Notes (Signed)
ANTICOAGULATION CONSULT NOTE   Pharmacy Consult for Warfarin Indication: atrial fibrillation  Allergies  Allergen Reactions   Sulfa Antibiotics Anaphylaxis   Tape Other (See Comments)    BRUISES VERY EASILY!!!!   Codeine Nausea And Vomiting   Hydroxychloroquine Other (See Comments)    GI upset   Shellfish-Derived Products Other (See Comments)    Daughter was unsure of the reaction- "happened years ago" and patient avoids foods that contain shellfish   Latex Rash   Neosporin Original [Bacitracin-Neomycin-Polymyxin] Rash   Polysporin [Bacitracin-Polymyxin B] Rash    Patient Measurements: Height: 5\' 3"  (160 cm) Weight: 43.3 kg (95 lb 7.4 oz) IBW/kg (Calculated) : 52.4  Vital Signs: Temp: 97.9 F (36.6 C) (09/16 0500) Temp Source: Oral (09/16 0500) BP: 106/68 (09/15 2152) Pulse Rate: 75 (09/16 0500)  Labs: Recent Labs    04/26/23 0501 04/27/23 0356 04/28/23 0417  HGB 11.6* 13.1 11.1*  HCT 37.4 42.9 35.9*  PLT 272 320 229  LABPROT 25.8* 27.2* 29.5*  INR 2.3* 2.5* 2.8*  CREATININE 0.70 0.85 0.63    Estimated Creatinine Clearance: 33.2 mL/min (by C-G formula based on SCr of 0.63 mg/dL).   Medications:  PTA: warfarin 2.5 mg daily except 5 mg on Thurs per [facility] clinic note.  Assessment: 89 yo F with a PMH of a-fib on warfarin PTA who presented to the ED on 04/22/2023 with acute exacerbation of chronic diastolic heart failure. Pharmacy has been consulted to resume warfarin on admission.  Today (04/28/2023) INR: 2.8 CBC: Hgb decreased from 13.1 to 11.1 Plt wnl No signs of bleeding per RN  Goal of Therapy:  INR 2-3 Monitor platelets by anticoagulation protocol: Yes   Plan:  Continue home regimen of 2.5 mg daily except 5 mg on Thursdays. Check INR daily Monitor for signs and symptoms of bleeding  Pam Vanalstine 04/28/2023,8:44 AM

## 2023-04-28 NOTE — Care Management Important Message (Signed)
Important Message  Patient Details IM Letter given. Name: Stacey [name] MRN: [id number] Date of Birth: 1933/08/02   Medicare Important Message Given:  Yes     Caren Macadam 04/28/2023, 1:12 PM

## 2023-04-28 NOTE — Plan of Care (Signed)
Problem: Clinical Measurements: Goal: Diagnostic test results will improve Outcome: Progressing Goal: Cardiovascular complication will be avoided Outcome: Progressing   Problem: Nutrition: Goal: Adequate nutrition will be maintained Outcome: Progressing   Problem: Coping: Goal: Level of anxiety will decrease Outcome: Progressing   Problem: Pain Managment: Goal: General experience of comfort will improve Outcome: Progressing

## 2023-04-29 ENCOUNTER — Telehealth: Payer: Self-pay

## 2023-04-29 DIAGNOSIS — I5031 Acute diastolic (congestive) heart failure: Secondary | ICD-10-CM | POA: Diagnosis not present

## 2023-04-29 DIAGNOSIS — J810 Acute pulmonary edema: Secondary | ICD-10-CM | POA: Diagnosis not present

## 2023-04-29 DIAGNOSIS — R441 Visual hallucinations: Secondary | ICD-10-CM | POA: Diagnosis not present

## 2023-04-29 DIAGNOSIS — I482 Chronic atrial fibrillation, unspecified: Secondary | ICD-10-CM | POA: Diagnosis not present

## 2023-04-29 LAB — CBC
HCT: 37.8 % (ref 36.0–46.0)
Hemoglobin: 11.4 g/dL — ABNORMAL LOW (ref 12.0–15.0)
MCH: 29.6 pg (ref 26.0–34.0)
MCHC: 30.2 g/dL (ref 30.0–36.0)
MCV: 98.2 fL (ref 80.0–100.0)
Platelets: 233 10*3/uL (ref 150–400)
RBC: 3.85 MIL/uL — ABNORMAL LOW (ref 3.87–5.11)
RDW: 14.9 % (ref 11.5–15.5)
WBC: 4.7 10*3/uL (ref 4.0–10.5)
nRBC: 0 % (ref 0.0–0.2)

## 2023-04-29 LAB — BASIC METABOLIC PANEL
Anion gap: 8 (ref 5–15)
BUN: 18 mg/dL (ref 8–23)
CO2: 28 mmol/L (ref 22–32)
Calcium: 8.6 mg/dL — ABNORMAL LOW (ref 8.9–10.3)
Chloride: 101 mmol/L (ref 98–111)
Creatinine, Ser: 0.47 mg/dL (ref 0.44–1.00)
GFR, Estimated: >60 mL/min (ref >60)
Glucose, Bld: 106 mg/dL — ABNORMAL HIGH (ref 70–99)
Potassium: 3.6 mmol/L (ref 3.5–5.1)
Sodium: 137 mmol/L (ref 135–145)

## 2023-04-29 LAB — PROTIME-INR
INR: 2.5 — ABNORMAL HIGH (ref 0.8–1.2)
Prothrombin Time: 27.2 s — ABNORMAL HIGH (ref 11.4–15.2)

## 2023-04-29 MED ORDER — METOPROLOL TARTRATE 50 MG PO TABS
50.0000 mg | ORAL_TABLET | Freq: Two times a day (BID) | ORAL | Status: DC
  Administered 2023-04-29 – 2023-04-30 (×3): 50 mg via ORAL
  Filled 2023-04-29 (×3): qty 1

## 2023-04-29 MED ORDER — METOPROLOL TARTRATE 5 MG/5ML IV SOLN
2.5000 mg | Freq: Once | INTRAVENOUS | Status: AC
  Administered 2023-04-29: 2.5 mg via INTRAVENOUS
  Filled 2023-04-29: qty 5

## 2023-04-29 MED ORDER — METOPROLOL TARTRATE 5 MG/5ML IV SOLN
5.0000 mg | Freq: Once | INTRAVENOUS | Status: DC
Start: 2023-04-29 — End: 2023-04-29

## 2023-04-29 NOTE — Telephone Encounter (Signed)
Reviewing pt chart due to it is time to test INR. Pt was admitted on 9/10 for pulmonary edema.  Will follow up with pt when d/c from hospital.

## 2023-04-29 NOTE — TOC Progression Note (Signed)
Transition of Care [facility]) - Progression Note    Patient Details  Name: Stacey [name] MRN: [id number] Date of Birth: 02-19-1933  Transition of Care [facility]) CM/SW Contact  Howell Rucks, RN Phone Number: 04/29/2023, 10:45 AM  Clinical Narrative:  PT eval completed, continue to recommend HH PT. Met with pt at bedside, pt declines HH PT. TOC will continue to follow.      Expected Discharge Plan: Home w Home Health Services Barriers to Discharge: Continued Medical Work up  Expected Discharge Plan and Services                                               Social Determinants of Health (SDOH) Interventions SDOH Screenings   Food Insecurity: No Food Insecurity (04/23/2023)  Housing: Low Risk  (04/23/2023)  Transportation Needs: No Transportation Needs (04/23/2023)  Utilities: Not At Risk (04/23/2023)  Alcohol Screen: Low Risk  (08/30/2020)  Depression (PHQ2-9): Low Risk  (04/07/2023)  Financial Resource Strain: Low Risk  (08/31/2021)  Physical Activity: Inactive (08/31/2021)  Social Connections: Socially Isolated (08/31/2021)  Stress: No Stress Concern Present (08/31/2021)  Tobacco Use: Medium Risk (04/22/2023)    Readmission Risk Interventions    04/25/2023   11:20 AM  Readmission Risk Prevention Plan  Transportation Screening Complete  PCP or Specialist Appt within 5-7 Days Complete  Home Care Screening Complete  Medication Review (RN CM) Complete

## 2023-04-29 NOTE — Plan of Care (Signed)
Problem: Clinical Measurements: Goal: Cardiovascular complication will be avoided Outcome: Progressing   Problem: Coping: Goal: Level of anxiety will decrease Outcome: Progressing   Problem: Pain Managment: Goal: General experience of comfort will improve Outcome: Progressing

## 2023-04-29 NOTE — Progress Notes (Addendum)
ANTICOAGULATION CONSULT NOTE   Pharmacy Consult for Warfarin Indication: atrial fibrillation  Allergies  Allergen Reactions   Sulfa Antibiotics Anaphylaxis   Tape Other (See Comments)    BRUISES VERY EASILY!!!!   Codeine Nausea And Vomiting   Hydroxychloroquine Other (See Comments)    GI upset   Shellfish-Derived Products Other (See Comments)    Daughter was unsure of the reaction- "happened years ago" and patient avoids foods that contain shellfish   Latex Rash   Neosporin Original [Bacitracin-Neomycin-Polymyxin] Rash   Polysporin [Bacitracin-Polymyxin B] Rash    Patient Measurements: Height: 5\' 3"  (160 cm) Weight: 41.6 kg (91 lb 11.4 oz) IBW/kg (Calculated) : 52.4  Vital Signs: Temp: 98.2 F (36.8 C) (09/17 0421) Temp Source: Oral (09/17 0421) BP: 138/84 (09/17 0421)  Labs: Recent Labs    04/27/23 0356 04/28/23 0417 04/29/23 0416  HGB 13.1 11.1* 11.4*  HCT 42.9 35.9* 37.8  PLT 320 229 233  LABPROT 27.2* 29.5* 27.2*  INR 2.5* 2.8* 2.5*  CREATININE 0.85 0.63 0.47    Estimated Creatinine Clearance: 31.9 mL/min (by C-G formula based on SCr of 0.47 mg/dL).   Medications:  PTA: warfarin 2.5 mg daily except 5 mg on Thurs per [facility] clinic note.  Assessment: 89 yo F with a PMH of a-fib on warfarin PTA who presented to the ED on 04/22/2023 with acute exacerbation of chronic diastolic heart failure. Pharmacy has been consulted to resume warfarin on admission.  Today (04/28/2023) INR: 2.5 - therapeutic CBC: Hgb 11.4 stable Plt WNL No signs of bleeding per RN  Goal of Therapy:  INR 2-3 Monitor platelets by anticoagulation protocol: Yes   Plan:  Continue home regimen of 2.5 mg daily except 5 mg on Thursdays. Check INR daily Monitor for signs and symptoms of bleeding  Herby Abraham, Pharm.D Use secure chat for questions 04/29/2023 11:01 AM

## 2023-04-29 NOTE — Evaluation (Addendum)
Occupational Therapy Evaluation Patient Details Name: Stacey [name] MRN: [id number] DOB: 01/28/1933 Today's Date: 04/29/2023   History of Present Illness 89 year old female with A-fib on warfarin, diastolic CHF, RA, GERD, HLD presented with exertional dyspnea, weakness.  She was on diuretics in the past however per her daughter, was discontinued when she was in the rehab due to potential kidney problems.  She has been on Lasix and torsemide.  Patient was noted to be short of breath and having exertional dyspnea, chest x-ray with moderate pleural effusions and vascular congestion. Pt had recent admission 8/1-03/18/23 with a fall, L superior pubic rami fx, B rib fxs, and compression fxs at T10, T11, L5.   Clinical Impression   Patient is a 89 year old female who was admitted for above. Patient was living at daughter's house with daughter providing 24/7 support for patient. Patient was educated on ECT, gentle ROM for BUE, importance of movement daily during session and different strategies for anxiousness. Patient verbalized understanding. Patient was supervision for bed mobility and mobility in room with patient simulating toileting tasks. Patients HR ranged from 75 bpm to 97 bpm during session.   Patient would continue to benefit from skilled OT services at this time while admitted and after d/c to address noted deficits in order to improve overall safety and independence in ADLs.        If plan is discharge home, recommend the following: A little help with walking and/or transfers;A little help with bathing/dressing/bathroom;Assistance with cooking/housework;Direct supervision/assist for medications management;Direct supervision/assist for financial management    Functional Status Assessment  Patient has had a recent decline in their functional status and demonstrates the ability to make significant improvements in function in a reasonable and predictable amount of time.  Equipment  Recommendations  None recommended by OT       Precautions / Restrictions Precautions Precautions: Fall Precaution Comments: monitor O2 Restrictions Weight Bearing Restrictions: No Other Position/Activity Restrictions: kyphotic      Mobility Bed Mobility Overal bed mobility: Modified Independent                      Balance Overall balance assessment: Needs assistance, History of Falls Sitting-balance support: Feet supported, No upper extremity supported Sitting balance-Leahy Scale: Fair     Standing balance support: Bilateral upper extremity supported, During functional activity, Reliant on assistive device for balance Standing balance-Leahy Scale: Poor         ADL either performed or assessed with clinical judgement   ADL Overall ADL's : Needs assistance/impaired Eating/Feeding: Set up;Sitting   Grooming: Sitting;Supervision/safety   Upper Body Bathing: Sitting;Set up   Lower Body Bathing: Sitting/lateral leans;Sit to/from stand;Contact guard assist;Minimal assistance   Upper Body Dressing : Supervision/safety;Sitting   Lower Body Dressing: Contact guard assist;Minimal assistance;Sitting/lateral leans;Sit to/from stand   Toilet Transfer: Supervision/safety;Rolling walker (2 wheels);Ambulation   Toileting- Clothing Manipulation and Hygiene: Sit to/from stand;Supervision/safety               Vision Patient Visual Report: No change from baseline              Pertinent Vitals/Pain Pain Assessment Pain Assessment: No/denies pain     Extremity/Trunk Assessment Upper Extremity Assessment Upper Extremity Assessment: Overall WFL for tasks assessed (noted to have signs of RA in digits bilaterally.)   Lower Extremity Assessment Lower Extremity Assessment: Defer to PT evaluation   Cervical / Trunk Assessment Cervical / Trunk Assessment: Kyphotic   Communication Communication Communication: No apparent difficulties  Cognition Arousal:  Alert Behavior During Therapy: WFL for tasks assessed/performed Overall Cognitive Status: Within Functional Limits for tasks assessed         General Comments: patient was plesant and cooperative during session. had good awareness of meidcations and deficits.                Home Living Family/patient expects to be discharged to:: Private residence Living Arrangements: Children Available Help at Discharge: Family;Available 24 hours/day Type of Home: House Home Access: Stairs to enter [employer] of Steps: 1 Entrance Stairs-Rails: None Home Layout: One level     Bathroom Shower/Tub: Producer, television/film/video: Standard     Home Equipment: Agricultural consultant (2 wheels);Cane - single point;Toilet riser;Grab bars - tub/shower;Grab bars - toilet;Shower seat   Additional Comments: [facility] to [facility] following recent admission 1 month ago following pelvic fx; lives with daughter      Prior Functioning/Environment Prior Level of Function : History of Falls (last six months);Independent/Modified Independent             Mobility Comments: walks with RW, has someone with her anytime she's walking ADLs Comments: assist for bathing/dressing; sponge bathes        OT Problem List: Decreased activity tolerance;Impaired balance (sitting and/or standing);Decreased coordination;Decreased safety awareness;Decreased knowledge of precautions;Decreased knowledge of use of DME or AE;Pain      OT Treatment/Interventions: Self-care/ADL training;Therapeutic exercise;Therapeutic activities;Patient/family education    OT Goals(Current goals can be found in the care plan section) Acute Rehab OT Goals Patient Stated Goal: to get back home OT Goal Formulation: With patient Time For Goal Achievement: 05/13/23 Potential to Achieve Goals: Fair  OT Frequency: Min 1X/week       AM-PAC OT "6 Clicks" Daily Activity     Outcome Measure Help from another person eating meals?:  None Help from another person taking care of personal grooming?: A Little Help from another person toileting, which includes using toliet, bedpan, or urinal?: A Little Help from another person bathing (including washing, rinsing, drying)?: A Little Help from another person to put on and taking off regular upper body clothing?: A Little Help from another person to put on and taking off regular lower body clothing?: A Little 6 Click Score: 19   End of Session Equipment Utilized During Treatment: Gait belt;Rolling walker (2 wheels) Nurse Communication: Mobility status  Activity Tolerance: Patient tolerated treatment well Patient left: in bed;with call bell/phone within reach  OT Visit Diagnosis: Unsteadiness on feet (R26.81);Other abnormalities of gait and mobility (R26.89)                Time: 2130-8657 OT Time Calculation (min): 35 min Charges:  OT General Charges $OT Visit: 1 Visit OT Evaluation $OT Eval Low Complexity: 1 Low OT Treatments $Self Care/Home Management : 8-22 mins  Rosalio Loud, MS Acute Rehabilitation Department Office# [phone]   Selinda Flavin 04/29/2023, 4:55 PM

## 2023-04-29 NOTE — Progress Notes (Signed)
[facility]                                                                              Stacey [name], is a 89 y.o. female, DOB - 1932-10-12, WUJ:[id number] Admit date - 04/22/2023    Outpatient Primary MD for the patient is [name], MD  LOS - 7  days  Chief Complaint  Patient presents with   Weakness   Shortness of Breath       Brief summary   Patient is a 89 year old female with A-fib on warfarin, diastolic CHF, RA, GERD, HLD presented with exertional dyspnea, weakness.  She was on diuretics in the past however per her daughter, was discontinued when she was in the rehab due to potential kidney problems.  She has been on Lasix and torsemide.  Patient was noted to be short of breath and having exertional dyspnea, no cough, fevers, chest pain. Sodium 134, normal renal function, BNP 636, chest x-ray with moderate pleural effusions and vascular congestion.  Patient was admitted for further workup.   Assessment & Plan    Principal Problem: Acute diastolic (congestive) heart failure (HCC) Moderate pleural effusions -Presented with dyspnea, lower extremity edema, pleural effusions, elevated BNP -Follows Dr. Carolan Clines outpatient -2D echo showed EF of 60 to 65%, no regional WMA, right ventricular systolic function mildly reduced severe TR, moderate MR -Patient was placed on IV Lasix for diuresis however due to hypotension, diuresis has been challenging -Metoprolol was resumed on 9/15, increased to outpatient dose of 50 mg twice daily -BP is now stable, discontinued midodrine -Per cardiology, patient will need to discharge on torsemide 20 mg daily, outpatient follow-up on 05/13/2023  Active Problems:   Chronic atrial fibrillation (HCC), persistent, with RVR -Heart rate 110s to 130s, gave Lopressor 2.5 mg IV x 1 -Increased metoprolol back to home dose of 50 mg twice daily  Mild chronic hyponatremia -Has chronic hyponatremia, sodium 132 on  03/16/2023 Resolved  Hypokalemia -Resolved    PVD (peripheral vascular disease) (HCC) -Continue statin, warfarin, has been seen by [facility]  History of rheumatoid arthritis -On chronic prednisone 5 mg p.o. daily -Random cortisol level 12.2 wnl, s/p 50mg  of IV hydrocortisone x2 for possibility of relative adrenal insufficiency due to chronic steroid use.  BP now stable -Outpatient follow-up with her rheumatologist    Hallucinations, visual, ?  Vertigo -Has been ongoing for over a month, with visual hallucinations -Has outpatient ophthalmology appointment -UA negative for UTI, MRI brain showed no acute intracranial abnormality, -B1 164, WNL, B12, folate WNL  TSH normal 1.2 -Per patient, no further hallucinations  Underweight Estimated body mass index is 16.25 kg/m as calculated from the following:   Height as of this encounter: 5\' 3"  (1.6 m).   Weight as of this encounter: 41.6 kg.  Code Status: DNR DVT Prophylaxis:   warfarin (COUMADIN) tablet 2.5 mg  warfarin (COUMADIN) tablet 5 mg   Level of Care: Level of care: Telemetry Family Communication: Updated patient's daughter, [name] on the phone on 9/15 Disposition Plan:      Remains inpatient appropriate: Plan for DC home tomorrow if heart rate controlled.  Patient  will need torsemide 20 mg daily upon discharge.   Procedures:  None  Consultants:   Cardiology  Antimicrobials:   Anti-infectives (From admission, onward)    None          Medications  atorvastatin  10 mg Oral Daily   feeding supplement  237 mL Oral BID BM   gabapentin  400 mg Oral Daily   metoprolol tartrate  50 mg Oral BID   pantoprazole  40 mg Oral Daily   predniSONE  5 mg Oral Q breakfast   sertraline  100 mg Oral Daily   sodium chloride flush  3 mL Intravenous Q12H   warfarin  2.5 mg Oral Once per day on Sunday Monday Tuesday Wednesday Friday Saturday   warfarin  5 mg Oral Every Thursday   Warfarin - Pharmacist Dosing Inpatient   Does not apply  q1600      Subjective:   [name] was seen and examined today.  Seen this morning, heart rate still uncontrolled, 110s to 130s.  No chest pain, no dizziness, lightheadedness or palpitations.  BP is stable. Objective:   Vitals:   04/28/23 2045 04/28/23 2116 04/29/23 0421 04/29/23 1146  BP:  120/87 138/84 110/64  Pulse:  90  90  Resp:   19 20  Temp: 97.9 F (36.6 C)  98.2 F (36.8 C) 98 F (36.7 C)  TempSrc: Oral  Oral Oral  SpO2:    97%  Weight:   41.6 kg   Height:        Intake/Output Summary (Last 24 hours) at 04/29/2023 1529 Last data filed at 04/29/2023 1100 Gross per 24 hour  Intake 480 ml  Output 300 ml  Net 180 ml     Wt Readings from Last 3 Encounters:  04/29/23 41.6 kg  04/07/23 40.4 kg  03/28/23 40.5 kg    Physical Exam General: Alert and oriented x 3, NAD Cardiovascular: Irregularly irregular, tachycardia Respiratory: CTAB, no wheezing Gastrointestinal: Soft, nontender, nondistended, NBS Ext: no pedal edema bilaterally Neuro: no new deficits Psych: Normal affect    Data Reviewed:  I have personally reviewed following labs    CBC Lab Results  Component Value Date   WBC 4.7 04/29/2023   RBC 3.85 (L) 04/29/2023   HGB 11.4 (L) 04/29/2023   HCT 37.8 04/29/2023   MCV 98.2 04/29/2023   MCH 29.6 04/29/2023   PLT 233 04/29/2023   MCHC 30.2 04/29/2023   RDW 14.9 04/29/2023   LYMPHSABS 0.3 (L) 04/22/2023   MONOABS 0.4 04/22/2023   EOSABS 0.0 04/22/2023   BASOSABS 0.0 04/22/2023     Last metabolic panel Lab Results  Component Value Date   NA 137 04/29/2023   K 3.6 04/29/2023   CL 101 04/29/2023   CO2 28 04/29/2023   BUN 18 04/29/2023   CREATININE 0.47 04/29/2023   GLUCOSE 106 (H) 04/29/2023   GFRNONAA >60 04/29/2023   GFRAA 64 11/24/2019   CALCIUM 8.6 (L) 04/29/2023   PROT 6.0 (L) 03/13/2023   ALBUMIN 3.2 (L) 03/13/2023   BILITOT 1.3 (H) 03/13/2023   ALKPHOS 72 03/13/2023   AST 34 03/13/2023   ALT 24 03/13/2023   ANIONGAP 8  04/29/2023    CBG (last 3)  No results for input(s): "GLUCAP" in the last 72 hours.    Coagulation Profile: Recent Labs  Lab 04/25/23 0420 04/26/23 0501 04/27/23 0356 04/28/23 0417 04/29/23 0416  INR 2.3* 2.3* 2.5* 2.8* 2.5*     Radiology Studies: I have personally reviewed the  imaging studies  No results found.     Thad Ranger M.D. [facility] 04/29/2023, 3:29 PM  Available via Epic secure chat 7am-7pm After 7 pm, please refer to night coverage provider listed on amion.

## 2023-04-30 ENCOUNTER — Other Ambulatory Visit (HOSPITAL_COMMUNITY): Payer: Self-pay

## 2023-04-30 ENCOUNTER — Other Ambulatory Visit: Payer: Self-pay | Admitting: Family Medicine

## 2023-04-30 DIAGNOSIS — I739 Peripheral vascular disease, unspecified: Secondary | ICD-10-CM | POA: Diagnosis not present

## 2023-04-30 DIAGNOSIS — I482 Chronic atrial fibrillation, unspecified: Secondary | ICD-10-CM | POA: Diagnosis not present

## 2023-04-30 DIAGNOSIS — J302 Other seasonal allergic rhinitis: Secondary | ICD-10-CM

## 2023-04-30 DIAGNOSIS — I5031 Acute diastolic (congestive) heart failure: Secondary | ICD-10-CM | POA: Diagnosis not present

## 2023-04-30 DIAGNOSIS — R441 Visual hallucinations: Secondary | ICD-10-CM | POA: Diagnosis not present

## 2023-04-30 LAB — CBC WITH DIFFERENTIAL/PLATELET
Abs Immature Granulocytes: 0.01 10*3/uL (ref 0.00–0.07)
Basophils Absolute: 0.1 10*3/uL (ref 0.0–0.1)
Basophils Relative: 2 %
Eosinophils Absolute: 0.1 10*3/uL (ref 0.0–0.5)
Eosinophils Relative: 2 %
HCT: 35.8 % — ABNORMAL LOW (ref 36.0–46.0)
Hemoglobin: 10.8 g/dL — ABNORMAL LOW (ref 12.0–15.0)
Immature Granulocytes: 0 %
Lymphocytes Relative: 17 %
Lymphs Abs: 0.7 10*3/uL (ref 0.7–4.0)
MCH: 29.3 pg (ref 26.0–34.0)
MCHC: 30.2 g/dL (ref 30.0–36.0)
MCV: 97 fL (ref 80.0–100.0)
Monocytes Absolute: 0.4 10*3/uL (ref 0.1–1.0)
Monocytes Relative: 11 %
Neutro Abs: 2.8 10*3/uL (ref 1.7–7.7)
Neutrophils Relative %: 68 %
Platelets: 216 10*3/uL (ref 150–400)
RBC: 3.69 MIL/uL — ABNORMAL LOW (ref 3.87–5.11)
RDW: 14.6 % (ref 11.5–15.5)
WBC: 4.1 10*3/uL (ref 4.0–10.5)
nRBC: 0 % (ref 0.0–0.2)

## 2023-04-30 MED ORDER — TORSEMIDE 20 MG PO TABS: 20.0000 mg | ORAL_TABLET | Freq: Every day | ORAL | 0 refills | Status: DC

## 2023-04-30 MED ORDER — TORSEMIDE 20 MG PO TABS
20.0000 mg | ORAL_TABLET | Freq: Every day | ORAL | 0 refills | Status: DC
  Filled 2023-04-30 – 2023-06-12 (×2): qty 30, 30d supply, fill #0

## 2023-04-30 MED ORDER — ENSURE ENLIVE PO LIQD: 237.0000 mL | Freq: Two times a day (BID) | ORAL | 12 refills | Status: AC

## 2023-04-30 NOTE — Hospital Course (Addendum)
The patient is an elderly 89 year old Caucasian female with past medical history significant for but not been to atrial fibrillation on anticoagulation, chronic diastolic CHF, rheumatoid arthritis, GERD, hyperlipidemia, as well as other comorbidities who presented with exertional dyspnea and weakness.  She was on diuretics in the past however her daughter stated that they were discontinued when she was in rehab due to potential kidney problems.  She had been on Lasix and torsemide previously.  Patient was noted to be short of breath and having exertional dyspnea but no fevers, cough or chest pain.  Initially her sodium was 134 and she had normal renal function and a BNP was elevated 626.  Chest x-ray was done which showed moderate pleural effusions and vascular congestion and she was admitted for further workup and placed on diuresis and cardiology was called and recommended diuresing and then transitioning to home torsemide 20 mg p.o. daily.  PT OT evaluated and recommended home health services.  She is deemed medically stable for discharge given that she is back to her baseline is no longer dyspneic.  She will need to repeat a chest x-ray in 3 to 6 weeks and follow-up with cardiology which is already scheduled for 05/13/2023.  Assessment and Plan:  Acute diastolic (congestive) heart failure (HCC), improved Moderate pleural effusion on the Left  -Presented with dyspnea, lower extremity edema, pleural effusions, elevated BNP -Follows Dr. Carolan Clines outpatient -2D echo showed EF of 60 to 65%, no regional WMA, right ventricular systolic function mildly reduced severe TR, moderate MR -Patient was placed on IV Lasix for diuresis however due to hypotension, diuresis has been challenging and is now on p.o. torsemide -Metoprolol was resumed on 9/15, increased to outpatient dose of 50 mg twice daily -BP is now stable, discontinued midodrine -Per cardiology, patient will need to discharge on torsemide 20 mg  daily, outpatient follow-up on 05/13/2023 -She will need a repeat chest x-ray within 3 to 6 weeks   Chronic atrial fibrillation (HCC), persistent, with RVR -Heart rate 110s to 130s, gave Lopressor 2.5 mg IV x 1 yesterday but her heart rates remained stable today -Increased metoprolol back to home dose of 50 mg twice daily yesterday and will be discharged on this -Continue with Coumadin and follow-up in the Coumadin clinic for repeat INR in 2 days   Mild chronic hyponatremia -Has chronic hyponatremia, sodium 132 on 03/16/2023 -Na+ Trend: Recent Labs  Lab 04/24/23 0359 04/25/23 0420 04/26/23 0501 04/27/23 0356 04/28/23 0417 04/29/23 0416 04/30/23 0421  NA 135 134* 138 139 136 137 135  -To monitor and trend and repeat CMP within 1 week   Hypokalemia -Patient's K+ Level Trend: Recent Labs  Lab 04/24/23 0359 04/25/23 0420 04/26/23 0501 04/27/23 0356 04/28/23 0417 04/29/23 0416 04/30/23 0421  K 3.8 4.3 3.3* 5.1 3.9 3.6 3.6  -Continue to Monitor and Replete as Necessary -Repeat CMP in the AM    PVD (peripheral vascular disease) (HCC) -Continue statin, warfarin, has been seen by [facility]   History of rheumatoid arthritis -On chronic prednisone 5 mg p.o. daily -Random cortisol level 12.2 wnl, s/p 50mg  of IV hydrocortisone x2 for possibility of relative adrenal insufficiency due to chronic steroid use.  BP now stable -Outpatient follow-up with her rheumatologist  Normocytic Anemia -Mild and current trend: Recent Labs  Lab 04/24/23 0359 04/25/23 0420 04/26/23 0501 04/27/23 0356 04/28/23 0417 04/29/23 0416 04/30/23 0419  HGB 11.7* 11.8* 11.6* 13.1 11.1* 11.4* 10.8*  HCT 36.8 37.8 37.4 42.9 35.9* 37.8 35.8*  MCV  93.4 94.5 95.2 95.5 97.6 98.2 97.0  -Continue to monitor for signs and symptoms of bleeding; no overt bleeding noted-folate level was 30.4 and vitamin B12 was 1868 -Follow-up CBC within 1 week as patient is on anticoagulation   Hallucinations, visual, ?  Vertigo,  stable -Has been ongoing for over a month, with visual hallucinations -Has outpatient ophthalmology appointment -UA negative for UTI, MRI brain showed no acute intracranial abnormality, -B1 164, WNL, B12, folate WNL  TSH normal 1.2 -Per patient, no further hallucinations while hospitalized   Underweight Estimated body mass index is 16.25 kg/m as calculated from the following:   Height as of this encounter: 5\' 3"  (1.6 m).   Weight as of this encounter: 41.6 kg.

## 2023-04-30 NOTE — Progress Notes (Signed)
Physical Therapy Treatment Patient Details Name: Stacey [name] MRN: [id number] DOB: 06/15/1933 Today's Date: 04/30/2023   History of Present Illness 89 year old female with A-fib on warfarin, diastolic CHF, RA, GERD, HLD presented with exertional dyspnea, weakness.  She was on diuretics in the past however per her daughter, was discontinued when she was in the rehab due to potential kidney problems.  She has been on Lasix and torsemide.  Patient was noted to be short of breath and having exertional dyspnea, chest x-ray with moderate pleural effusions and vascular congestion. Pt had recent admission 8/1-03/18/23 with a fall, L superior pubic rami fx, B rib fxs, and compression fxs at T10, T11, L5.    PT Comments  General Comments: AxO x 3 very pleasant Lady feeling "better". Assisted OOB to amb in hallway went well.  General bed mobility comments: self able with increased time both OOB and back into bed. General transfer comment: self able with increased time.  Pt is cautious.  Good use of hands to steady self.  Poor kyphotic posture.  Pt slow but steady.  General Gait Details: tolerated an increased distance amb in hallway with walker with avg HR 104.  Much improved from previuos day.  Decreased c/o fatigue.  Feeling "better" and "not as tired". Pt plans to return home with family support.    If plan is discharge home, recommend the following: A little help with walking and/or transfers;A little help with bathing/dressing/bathroom;Assistance with cooking/housework;Assist for transportation;Help with stairs or ramp for entrance   Can travel by private vehicle        Equipment Recommendations  None recommended by PT    Recommendations for Other Services       Precautions / Restrictions Precautions Precautions: Fall Precaution Comments: monitor HR Restrictions Weight Bearing Restrictions: No Other Position/Activity Restrictions: kyphotic     Mobility  Bed Mobility Overal bed  mobility: Modified Independent             General bed mobility comments: self able with increased time both OOB and back into bed.    Transfers Overall transfer level: Needs assistance Equipment used: Rolling walker (2 wheels) Transfers: Sit to/from Stand Sit to Stand: Supervision, Contact guard assist           General transfer comment: self able with increased time.  Pt is cautious.  Good use of hands to steady self.  Poor kyphotic posture.  Pt slow but steady.    Ambulation/Gait Ambulation/Gait assistance: Supervision, Contact guard assist Gait Distance (Feet): 45 Feet Assistive device: Rolling walker (2 wheels) Gait Pattern/deviations: Step-through pattern, Trunk flexed, Decreased stride length Gait velocity: decr     General Gait Details: tolerated an increased distance amb in hallway with walker with avg HR 104.  Much improved from previuos day.  Decreased c/o fatigue.  Feeling "better" and "not as tired".   Stairs             Wheelchair Mobility     Tilt Bed    Modified Rankin (Stroke Patients Only)       Balance                                            Cognition Arousal: Alert Behavior During Therapy: WFL for tasks assessed/performed Overall Cognitive Status: Within Functional Limits for tasks assessed  General Comments: AxO x 3 very pleasant Lady feeling "better".        Exercises      General Comments        Pertinent Vitals/Pain Pain Assessment Pain Assessment: No/denies pain    Home Living                          Prior Function            PT Goals (current goals can now be found in the care plan section) Progress towards PT goals: Progressing toward goals    Frequency    Min 1X/week      PT Plan      Co-evaluation              AM-PAC PT "6 Clicks" Mobility   Outcome Measure  Help needed turning from your back to your side  while in a flat bed without using bedrails?: A Little Help needed moving from lying on your back to sitting on the side of a flat bed without using bedrails?: A Little Help needed moving to and from a bed to a chair (including a wheelchair)?: A Little Help needed standing up from a chair using your arms (e.g., wheelchair or bedside chair)?: A Little Help needed to walk in hospital room?: A Little Help needed climbing 3-5 steps with a railing? : A Little 6 Click Score: 18    End of Session Equipment Utilized During Treatment: Gait belt Activity Tolerance: Patient tolerated treatment well Patient left: in bed;with call bell/phone within reach;with nursing/sitter in room Nurse Communication: Mobility status PT Visit Diagnosis: Other abnormalities of gait and mobility (R26.89);Difficulty in walking, not elsewhere classified (R26.2)     Time: 4132-4401 PT Time Calculation (min) (ACUTE ONLY): 14 min  Charges:    $Gait Training: 8-22 mins PT General Charges $$ ACUTE PT VISIT: 1 Visit                     Felecia Shelling  PTA Acute  Rehabilitation Services Office M-F          [phone]

## 2023-04-30 NOTE — Plan of Care (Signed)

## 2023-04-30 NOTE — Discharge Summary (Signed)
Physician Discharge Summary   Patient: Stacey [name] MRN: [id number] DOB: [birth year]  Admit date:     04/22/2023  Discharge date: 04/30/23  Discharge Physician: Marguerita Merles, DO   PCP: [name], MD   Recommendations at discharge:   Follow-up with PCP within 1 to 2 weeks and repeat CBC, CMP, mag, Phos within 1 week Follow-up with cardiology in outpatient setting and has appointment scheduled for 05/13/2023.  Patient has been told to remain compliant with her torsemide 20 mg p.o. daily. Repeat chest x-ray in 3 to 6 weeks to reevaluate the pleural effusions  Discharge Diagnoses: Principal Problem:   Acute diastolic (congestive) heart failure (HCC) Active Problems:   Chronic atrial fibrillation (HCC)   PVD (peripheral vascular disease) (HCC)   Hallucinations, visual  Resolved Problems:   * No resolved hospital problems. [facility] Course: The patient is an elderly 89 year old Caucasian female with past medical history significant for but not been to atrial fibrillation on anticoagulation, chronic diastolic CHF, rheumatoid arthritis, GERD, hyperlipidemia, as well as other comorbidities who presented with exertional dyspnea and weakness.  She was on diuretics in the past however her daughter stated that they were discontinued when she was in rehab due to potential kidney problems.  She had been on Lasix and torsemide previously.  Patient was noted to be short of breath and having exertional dyspnea but no fevers, cough or chest pain.  Initially her sodium was 134 and she had normal renal function and a BNP was elevated 626.  Chest x-ray was done which showed moderate pleural effusions and vascular congestion and she was admitted for further workup and placed on diuresis and cardiology was called and recommended diuresing and then transitioning to home torsemide 20 mg p.o. daily.  PT OT evaluated and recommended home health services.  She is deemed medically stable for discharge  given that she is back to her baseline is no longer dyspneic.  She will need to repeat a chest x-ray in 3 to 6 weeks and follow-up with cardiology which is already scheduled for 05/13/2023.  Assessment and Plan:  Acute diastolic (congestive) heart failure (HCC), improved Moderate pleural effusion on the Left  -Presented with dyspnea, lower extremity edema, pleural effusions, elevated BNP -Follows Dr. Carolan Clines outpatient -2D echo showed EF of 60 to 65%, no regional WMA, right ventricular systolic function mildly reduced severe TR, moderate MR -Patient was placed on IV Lasix for diuresis however due to hypotension, diuresis has been challenging and is now on p.o. torsemide -Metoprolol was resumed on 9/15, increased to outpatient dose of 50 mg twice daily -BP is now stable, discontinued midodrine -Per cardiology, patient will need to discharge on torsemide 20 mg daily, outpatient follow-up on 05/13/2023 -She will need a repeat chest x-ray within 3 to 6 weeks   Chronic atrial fibrillation (HCC), persistent, with RVR -Heart rate 110s to 130s, gave Lopressor 2.5 mg IV x 1 yesterday but her heart rates remained stable today -Increased metoprolol back to home dose of 50 mg twice daily yesterday and will be discharged on this -Continue with Coumadin and follow-up in the Coumadin clinic for repeat INR in 2 days   Mild chronic hyponatremia -Has chronic hyponatremia, sodium 132 on 03/16/2023 -Na+ Trend: Recent Labs  Lab 04/24/23 0359 04/25/23 0420 04/26/23 0501 04/27/23 0356 04/28/23 0417 04/29/23 0416 04/30/23 0421  NA 135 134* 138 139 136 137 135  -To monitor and trend and repeat CMP within 1 week   Hypokalemia -Patient's  K+ Level Trend: Recent Labs  Lab 04/24/23 0359 04/25/23 0420 04/26/23 0501 04/27/23 0356 04/28/23 0417 04/29/23 0416 04/30/23 0421  K 3.8 4.3 3.3* 5.1 3.9 3.6 3.6  -Continue to Monitor and Replete as Necessary -Repeat CMP in the AM    PVD (peripheral vascular  disease) (HCC) -Continue statin, warfarin, has been seen by [facility]   History of rheumatoid arthritis -On chronic prednisone 5 mg p.o. daily -Random cortisol level 12.2 wnl, s/p 50mg  of IV hydrocortisone x2 for possibility of relative adrenal insufficiency due to chronic steroid use.  BP now stable -Outpatient follow-up with her rheumatologist  Normocytic Anemia -Mild and current trend: Recent Labs  Lab 04/24/23 0359 04/25/23 0420 04/26/23 0501 04/27/23 0356 04/28/23 0417 04/29/23 0416 04/30/23 0419  HGB 11.7* 11.8* 11.6* 13.1 11.1* 11.4* 10.8*  HCT 36.8 37.8 37.4 42.9 35.9* 37.8 35.8*  MCV 93.4 94.5 95.2 95.5 97.6 98.2 97.0  -Continue to monitor for signs and symptoms of bleeding; no overt bleeding noted-folate level was 30.4 and vitamin B12 was 1868 -Follow-up CBC within 1 week as patient is on anticoagulation   Hallucinations, visual, ?  Vertigo, stable -Has been ongoing for over a month, with visual hallucinations -Has outpatient ophthalmology appointment -UA negative for UTI, MRI brain showed no acute intracranial abnormality, -B1 164, WNL, B12, folate WNL  TSH normal 1.2 -Per patient, no further hallucinations while hospitalized   Underweight Estimated body mass index is 16.25 kg/m as calculated from the following:   Height as of this encounter: 5\' 3"  (1.6 m).   Weight as of this encounter: 41.6 kg.  Consultants: Cardiology  Procedures performed: As delineated as above Disposition: Home health Diet recommendation:  Cardiac diet DISCHARGE MEDICATION: Allergies as of 04/30/2023       Reactions   Sulfa Antibiotics Anaphylaxis   Tape Other (See Comments)   BRUISES VERY EASILY!!!!   Codeine Nausea And Vomiting   Hydroxychloroquine Other (See Comments)   GI upset   Shellfish-derived Products Other (See Comments)   Daughter was unsure of the reaction- "happened years ago" and patient avoids foods that contain shellfish   Latex Rash   Neosporin Original  [bacitracin-neomycin-polymyxin] Rash   Polysporin [bacitracin-polymyxin B] Rash        Medication List     TAKE these medications    acetaminophen 500 MG tablet Commonly known as: TYLENOL Take 500 mg by mouth 3 (three) times daily as needed (for R.A. pain).   atorvastatin 10 MG tablet Commonly known as: LIPITOR TAKE 1 TABLET DAILY What changed: when to take this   cyanocobalamin 1000 MCG tablet Take 1,000 mcg by mouth daily.   feeding supplement Liqd Take 237 mLs by mouth 2 (two) times daily between meals.   fexofenadine 60 MG tablet Commonly known as: ALLEGRA TAKE 1 TABLET DAILY AS NEEDED FOR ALLERGIES OR RHINITIS What changed: See the new instructions.   gabapentin 400 MG capsule Commonly known as: NEURONTIN TAKE 1 CAPSULE DAILY What changed: when to take this   meclizine 12.5 MG tablet Commonly known as: ANTIVERT Take 1 tablet (12.5 mg total) by mouth 3 (three) times daily as needed for dizziness.   metoprolol tartrate 50 MG tablet Commonly known as: LOPRESSOR TAKE 1 TABLET TWICE A DAY   multivitamin capsule Take 1 capsule by mouth daily with breakfast.   omeprazole 20 MG capsule Commonly known as: PRILOSEC Take 20 mg by mouth daily before breakfast.   predniSONE 5 MG tablet Commonly known as: DELTASONE Take 5 mg by  mouth daily with breakfast.   sertraline 100 MG tablet Commonly known as: ZOLOFT TAKE 1 TABLET DAILY What changed: when to take this   torsemide 20 MG tablet Commonly known as: DEMADEX Take 1 tablet (20 mg total) by mouth daily.   traMADol 50 MG tablet Commonly known as: ULTRAM Take 2 tablets (100 mg) in a.m., 1 tablet (50 mg) at lunch, and 1 tablet (50 mg) in evening as needed for moderate to severe pain from arthritis and hip fracture. What changed:  how much to take how to take this when to take this additional instructions   Vitamin D3 1000 units Caps Take 1,000 Units by mouth daily.   warfarin 5 MG tablet Commonly known  as: COUMADIN Take as directed. If you are unsure how to take this medication, talk to your nurse or doctor. Original instructions: Take 2.5-5 mg by mouth See admin instructions. Take 2.5 mg by mouth at bedtime on Sun/Mon/Tues/Wed/Fri/Sat and 5 mg on Thursdays               Discharge Care Instructions  (From admission, onward)           Start     Ordered   04/30/23 0000  Discharge wound care:       Comments: Silicone foam dressings to the Heels, vertebral column, sacrum, change every 3 days. ASSESS UNDER dressings each shift for any acute changes in the wounds.   04/30/23 1412            Discharge Exam: Filed Weights   04/28/23 0500 04/29/23 0421 04/30/23 0437  Weight: 43.3 kg 41.6 kg 40.7 kg   Vitals:   04/30/23 0437 04/30/23 1215  BP:    Pulse:  82  Resp:  18  Temp: 98.2 F (36.8 C) 97.8 F (36.6 C)  SpO2: 94% 97%   Examination: Physical Exam:  Constitutional: Thin Caucasian female in no acute distress Respiratory: Diminished to auscultation bilaterally with some coarse breath sounds, no wheezing, rales, rhonchi or crackles. Normal respiratory effort and patient is not tachypenic. No accessory muscle use.  Unlabored breathing Cardiovascular: RRR, no murmurs / rubs / gallops. S1 and S2 auscultated. No extremity edema.  Abdomen: Soft, non-tender, non-distended. Bowel sounds positive.  GU: Deferred. Musculoskeletal: No clubbing / cyanosis of digits/nails. No joint deformity upper and lower extremities. Skin: No rashes, lesions, ulcers on limited skin evaluation. No induration; Warm and dry.  Neurologic: CN 2-12 grossly intact with no focal deficits. Romberg sign and cerebellar reflexes not assessed.  Psychiatric: Normal judgment and insight. Alert and oriented x 3. Normal mood and appropriate affect.   Condition at discharge: stable  The results of significant diagnostics from this hospitalization (including imaging, microbiology, ancillary and laboratory)  are listed below for reference.   Imaging Studies: MR BRAIN WO CONTRAST  Result Date: 04/23/2023 CLINICAL DATA:  Transient ischemic attack EXAM: MRI HEAD WITHOUT CONTRAST TECHNIQUE: Multiplanar, multiecho pulse sequences of the brain and surrounding structures were obtained without intravenous contrast. COMPARISON:  None Available. FINDINGS: Brain: No acute infarct or hemorrhage. There is generalized volume loss. Mild scattered white matter hyperintensity. The midline structures are normal. Vascular: Normal flow voids. Skull and upper cervical spine: Hypertrophic soft tissue around the odontoid process with mild mass effect on the spinal cord. Sinuses/Orbits: Paranasal sinuses are clear. Bilateral ocular implants. Other: None IMPRESSION: 1. No acute intracranial abnormality. 2. Generalized volume loss and mild chronic microvascular ischemia. 3. Hypertrophic soft tissue around the odontoid process with mild mass effect  on the spinal cord. Electronically Signed   By: Deatra Robinson M.D.   On: 04/23/2023 23:49   ECHOCARDIOGRAM COMPLETE  Result Date: 04/23/2023    ECHOCARDIOGRAM REPORT   Patient Name:   [facility] MAE Keelin Date of Exam: 04/23/2023 Medical Rec #:  [id number]           Height:       63.0 in Accession #:    [id number]          Weight:       93.9 lb Date of Birth:  [birth year]           BSA:          1.402 m Patient Age:    89 years            BP:           126/64 mmHg Patient Gender: F                   HR:           109 bpm. Exam Location:  Inpatient Procedure: 2D Echo, Cardiac Doppler and Color Doppler Indications:    CHF-Acute Diastolic I50.31  History:        Patient has prior history of Echocardiogram examinations, most                 recent 06/10/2018. CHF, Arrythmias:Atrial Fibrillation; Risk                 Factors:Former Smoker and Dyslipidemia.  Sonographer:    Aron Baba Referring Phys: [id number] MIR M [facility]  Sonographer Comments: Suboptimal subcostal window. IMPRESSIONS  1. Left  ventricular ejection fraction, by estimation, is 60 to 65%. The left ventricle has normal function. The left ventricle has no regional wall motion abnormalities. Left ventricular diastolic parameters are indeterminate.  2. Right ventricular systolic function is mildly reduced. The right ventricular size is moderately enlarged.  3. Left atrial size was severely dilated.  4. Right atrial size was severely dilated.  5. The mitral valve is myxomatous. Moderate mitral valve regurgitation.  6. Tricuspid valve regurgitation is severe.  7. There is moderate calcification of the aortic valve. Aortic valve regurgitation is not visualized.  8. The inferior vena cava is normal in size with greater than 50% respiratory variability, suggesting right atrial pressure of 3 mmHg. FINDINGS  Left Ventricle: Left ventricular ejection fraction, by estimation, is 60 to 65%. The left ventricle has normal function. The left ventricle has no regional wall motion abnormalities. The left ventricular internal cavity size was normal in size. There is  no left ventricular hypertrophy. Left ventricular diastolic parameters are indeterminate. Right Ventricle: The right ventricular size is moderately enlarged. Right ventricular systolic function is mildly reduced. Left Atrium: Left atrial size was severely dilated. Right Atrium: Right atrial size was severely dilated. Pericardium: There is no evidence of pericardial effusion. Mitral Valve: The mitral valve is myxomatous. Moderate mitral valve regurgitation. Tricuspid Valve: Tricuspid valve regurgitation is severe. Aortic Valve: There is moderate calcification of the aortic valve. Aortic valve regurgitation is not visualized. Pulmonic Valve: Pulmonic valve regurgitation is not visualized. Aorta: The aortic root and ascending aorta are structurally normal, with no evidence of dilitation. Venous: The inferior vena cava is normal in size with greater than 50% respiratory variability, suggesting right  atrial pressure of 3 mmHg. IAS/Shunts: The interatrial septum was not well visualized.  LEFT VENTRICLE PLAX 2D LVIDd:         3.50  cm   Diastology LVIDs:         2.10 cm   LV e' medial:    6.64 cm/s LV PW:         0.90 cm   LV E/e' medial:  15.8 LV IVS:        0.50 cm   LV e' lateral:   11.40 cm/s LVOT diam:     1.80 cm   LV E/e' lateral: 9.2 LV SV:         25 LV SV Index:   18 LVOT Area:     2.54 cm  RIGHT VENTRICLE RV S prime:     12.90 cm/s TAPSE (M-mode): 0.7 cm LEFT ATRIUM           Index        RIGHT ATRIUM           Index LA diam:      4.90 cm 3.49 cm/m   RA Area:     35.10 cm LA Vol (A2C): 24.2 ml 17.26 ml/m  RA Volume:   142.00 ml 101.26 ml/m LA Vol (A4C): 80.9 ml 57.69 ml/m  AORTIC VALVE LVOT Vmax:   50.30 cm/s LVOT Vmean:  35.400 cm/s LVOT VTI:    0.097 m  AORTA Ao Root diam: 3.20 cm Ao Asc diam:  3.10 cm MITRAL VALVE                 TRICUSPID VALVE MV Area (PHT): 6.54 cm      TR Peak grad:   49.8 mmHg MV Decel Time: 116 msec      TR Vmax:        353.00 cm/s MR Peak grad:   95.8 mmHg MR Mean grad:   61.0 mmHg    SHUNTS MR Vmax:        489.50 cm/s  Systemic VTI:  0.10 m MR Vmean:       373.0 cm/s   Systemic Diam: 1.80 cm MR PISA:        0.57 cm MR PISA Radius: 0.30 cm MV E velocity: 105.00 cm/s Carolan Clines Electronically signed by Carolan Clines Signature Date/Time: 04/23/2023/2:33:28 PM    Final    DG Chest Portable 1 View  Result Date: 04/22/2023 CLINICAL DATA:  Dyspnea. Exertional shortness of breath since yesterday. EXAM: PORTABLE CHEST 1 VIEW COMPARISON:  03/13/2023 FINDINGS: Marked patient rotation to the left. No gross change in enlargement of the cardiac silhouette. Interval moderate-sized left pleural effusion and small right pleural effusion. Mild increase in prominence of the interstitial markings, including Kerley lines. Left basilar atelectasis or pneumonia. Diffuse osteopenia. Diffuse osteopenia. Superior migration of the left humeral head. IMPRESSION: 1. Interval development of mild  congestive heart failure. 2. Interval development of a moderate-sized left pleural effusion and small right pleural effusion. 3. Left basilar atelectasis or pneumonia. 4. Chronic left rotator cuff tear. Electronically Signed   By: Beckie Salts M.D.   On: 04/22/2023 17:34   CT Head Wo Contrast  Result Date: 04/22/2023 CLINICAL DATA:  Weakness and vision impairment. EXAM: CT HEAD WITHOUT CONTRAST TECHNIQUE: Contiguous axial images were obtained from the base of the skull through the vertex without intravenous contrast. RADIATION DOSE REDUCTION: This exam was performed according to the departmental dose-optimization program which includes automated exposure control, adjustment of the mA and/or kV according to patient size and/or use of iterative reconstruction technique. COMPARISON:  Head CT dated 03/13/2023. FINDINGS: Brain: Mild age-related atrophy and chronic microvascular ischemic changes. There is  no acute intracranial hemorrhage. No mass effect or midline shift. No extra-axial fluid collection. Vascular: No hyperdense vessel or unexpected calcification. Skull: Normal. Negative for fracture or focal lesion. Sinuses/Orbits: No acute finding. Other: None IMPRESSION: 1. No acute intracranial pathology. 2. Mild age-related atrophy and chronic microvascular ischemic changes. Electronically Signed   By: Elgie Collard M.D.   On: 04/22/2023 17:20    Microbiology: Results for orders placed or performed during the hospital encounter of 03/13/23  MRSA Next Gen by PCR, Nasal     Status: None   Collection Time: 03/14/23  6:48 AM   Specimen: Nasal Mucosa; Nasal Swab  Result Value Ref Range Status   MRSA by PCR Next Gen NOT DETECTED NOT DETECTED Final    Comment: (NOTE) The GeneXpert MRSA Assay (FDA approved for NASAL specimens only), is one component of a comprehensive MRSA colonization surveillance program. It is not intended to diagnose MRSA infection nor to guide or monitor treatment for MRSA  infections. Test performance is not FDA approved in patients less than 32 years old. Performed at [facility], [address] [place], [place] [address]    Labs: CBC: Recent Labs  Lab 04/26/23 0501 04/27/23 0356 04/28/23 0417 04/29/23 0416 04/30/23 0419  WBC 7.4 7.0 4.6 4.7 4.1  NEUTROABS  --   --   --   --  2.8  HGB 11.6* 13.1 11.1* 11.4* 10.8*  HCT 37.4 42.9 35.9* 37.8 35.8*  MCV 95.2 95.5 97.6 98.2 97.0  PLT 272 320 229 233 216   Basic Metabolic Panel: Recent Labs  Lab 04/24/23 0359 04/25/23 0420 04/26/23 0501 04/27/23 0356 04/28/23 0417 04/29/23 0416 04/30/23 0421  NA 135   < > 138 139 136 137 135  K 3.8   < > 3.3* 5.1 3.9 3.6 3.6  CL 101   < > 98 100 101 101 101  CO2 25   < > 29 29 28 28 28   GLUCOSE 126*   < > 101* 136* 99 106* 102*  BUN 13   < > 22 31* 26* 18 20  CREATININE 0.51   < > 0.70 0.85 0.63 0.47 0.44  CALCIUM 9.0   < > 8.5* 9.1 8.8* 8.6* 8.7*  MG 1.9  --   --   --   --   --   --    < > = values in this interval not displayed.   Liver Function Tests: No results for input(s): "AST", "ALT", "ALKPHOS", "BILITOT", "PROT", "ALBUMIN" in the last 168 hours. CBG: No results for input(s): "GLUCAP" in the last 168 hours.  Discharge time spent: greater than 30 minutes.  Signed: Marguerita Merles, DO [facility] 04/30/2023

## 2023-04-30 NOTE — Progress Notes (Signed)
ANTICOAGULATION CONSULT NOTE   Pharmacy Consult for Warfarin Indication: atrial fibrillation  Allergies  Allergen Reactions   Sulfa Antibiotics Anaphylaxis   Tape Other (See Comments)    BRUISES VERY EASILY!!!!   Codeine Nausea And Vomiting   Hydroxychloroquine Other (See Comments)    GI upset   Shellfish-Derived Products Other (See Comments)    Daughter was unsure of the reaction- "happened years ago" and patient avoids foods that contain shellfish   Latex Rash   Neosporin Original [Bacitracin-Neomycin-Polymyxin] Rash   Polysporin [Bacitracin-Polymyxin B] Rash    Patient Measurements: Height: 5\' 3"  (160 cm) Weight: 40.7 kg (89 lb 11.6 oz) IBW/kg (Calculated) : 52.4  Vital Signs: Temp: 98.2 F (36.8 C) (09/18 0437) Temp Source: Oral (09/18 0437)  Labs: Recent Labs    04/28/23 0417 04/29/23 0416 04/30/23 0419 04/30/23 0421  HGB 11.1* 11.4* 10.8*  --   HCT 35.9* 37.8 35.8*  --   PLT 229 233 216  --   LABPROT 29.5* 27.2*  --  23.8*  INR 2.8* 2.5*  --  2.1*  CREATININE 0.63 0.47  --  0.44    Estimated Creatinine Clearance: 31.2 mL/min (by C-G formula based on SCr of 0.44 mg/dL).   Medications:  PTA: warfarin 2.5 mg daily except 5 mg on Thurs per [facility] clinic note.  Assessment: 89 yo F with a PMH of a-fib on warfarin PTA who presented to the ED on 04/22/2023 with acute exacerbation of chronic diastolic heart failure. Pharmacy has been consulted to resume warfarin on admission.  Today (04/28/2023) INR: 2.1 - remains therapeutic on home dose CBC: stable No signs of bleeding per RN  Goal of Therapy:  INR 2-3 Monitor platelets by anticoagulation protocol: Yes   Plan:  Continue home regimen of 2.5 mg daily except 5 mg on Thursdays. Check INR daily Monitor for signs and symptoms of bleeding  Herby Abraham, Pharm.D Use secure chat for questions 04/30/2023 9:40 AM

## 2023-05-01 ENCOUNTER — Other Ambulatory Visit: Payer: Self-pay | Admitting: Family Medicine

## 2023-05-01 DIAGNOSIS — I482 Chronic atrial fibrillation, unspecified: Secondary | ICD-10-CM

## 2023-05-01 DIAGNOSIS — I509 Heart failure, unspecified: Secondary | ICD-10-CM

## 2023-05-02 ENCOUNTER — Telehealth: Payer: Self-pay

## 2023-05-02 DIAGNOSIS — I959 Hypotension, unspecified: Secondary | ICD-10-CM | POA: Diagnosis not present

## 2023-05-02 DIAGNOSIS — I4811 Longstanding persistent atrial fibrillation: Secondary | ICD-10-CM | POA: Diagnosis not present

## 2023-05-02 DIAGNOSIS — K219 Gastro-esophageal reflux disease without esophagitis: Secondary | ICD-10-CM | POA: Diagnosis not present

## 2023-05-02 DIAGNOSIS — M150 Primary generalized (osteo)arthritis: Secondary | ICD-10-CM | POA: Diagnosis not present

## 2023-05-02 DIAGNOSIS — E785 Hyperlipidemia, unspecified: Secondary | ICD-10-CM | POA: Diagnosis not present

## 2023-05-02 DIAGNOSIS — J918 Pleural effusion in other conditions classified elsewhere: Secondary | ICD-10-CM | POA: Diagnosis not present

## 2023-05-02 DIAGNOSIS — Z7952 Long term (current) use of systemic steroids: Secondary | ICD-10-CM | POA: Diagnosis not present

## 2023-05-02 DIAGNOSIS — F32A Depression, unspecified: Secondary | ICD-10-CM | POA: Diagnosis not present

## 2023-05-02 DIAGNOSIS — I739 Peripheral vascular disease, unspecified: Secondary | ICD-10-CM | POA: Diagnosis not present

## 2023-05-02 DIAGNOSIS — Z7901 Long term (current) use of anticoagulants: Secondary | ICD-10-CM | POA: Diagnosis not present

## 2023-05-02 DIAGNOSIS — Z87891 Personal history of nicotine dependence: Secondary | ICD-10-CM | POA: Diagnosis not present

## 2023-05-02 DIAGNOSIS — R636 Underweight: Secondary | ICD-10-CM | POA: Diagnosis not present

## 2023-05-02 DIAGNOSIS — Z9181 History of falling: Secondary | ICD-10-CM | POA: Diagnosis not present

## 2023-05-02 DIAGNOSIS — I081 Rheumatic disorders of both mitral and tricuspid valves: Secondary | ICD-10-CM | POA: Diagnosis not present

## 2023-05-02 DIAGNOSIS — G629 Polyneuropathy, unspecified: Secondary | ICD-10-CM | POA: Diagnosis not present

## 2023-05-02 DIAGNOSIS — Z681 Body mass index (BMI) 19 or less, adult: Secondary | ICD-10-CM | POA: Diagnosis not present

## 2023-05-02 DIAGNOSIS — Z556 Problems related to health literacy: Secondary | ICD-10-CM | POA: Diagnosis not present

## 2023-05-02 DIAGNOSIS — I10 Essential (primary) hypertension: Secondary | ICD-10-CM | POA: Diagnosis not present

## 2023-05-02 DIAGNOSIS — M800AXD Age-related osteoporosis with current pathological fracture, other site, subsequent encounter for fracture with routine healing: Secondary | ICD-10-CM | POA: Diagnosis not present

## 2023-05-02 DIAGNOSIS — I5031 Acute diastolic (congestive) heart failure: Secondary | ICD-10-CM | POA: Diagnosis not present

## 2023-05-02 DIAGNOSIS — H6122 Impacted cerumen, left ear: Secondary | ICD-10-CM | POA: Diagnosis not present

## 2023-05-02 DIAGNOSIS — M0579 Rheumatoid arthritis with rheumatoid factor of multiple sites without organ or systems involvement: Secondary | ICD-10-CM | POA: Diagnosis not present

## 2023-05-02 DIAGNOSIS — M75102 Unspecified rotator cuff tear or rupture of left shoulder, not specified as traumatic: Secondary | ICD-10-CM | POA: Diagnosis not present

## 2023-05-02 NOTE — Transitions of Care (Post Inpatient/ED Visit) (Signed)
05/02/2023  Name: Stacey [name] MRN: [id number] DOB: 1932-10-19  Today's TOC FU Call Status: Today's TOC FU Call Status:: Successful TOC FU Call Completed TOC FU Call Complete Date: 05/02/23 Patient's Name and Date of Birth confirmed.  Transition Care Management Follow-up Telephone Call Date of Discharge: 04/30/23 Discharge Facility: [facility]) Type of Discharge: Inpatient Admission Primary Inpatient Discharge Diagnosis:: "acute pulm edema" How have you been since you were released from the hospital?: Better (spoke w/ daughter &pt-"doing well-feeling better"-denies any edema/SOB-taking Torsemide daily, Appetite good. LBM two days ago, pt up walking w/ walker) Any questions or concerns?: No  Items Reviewed: Did you receive and understand the discharge instructions provided?: Yes Medications obtained,verified, and reconciled?: Yes (Medications Reviewed) Any new allergies since your discharge?: No Dietary orders reviewed?: Yes Type of Diet Ordered:: low salt/heart healthy Do you have support at home?: Yes People in Home: child(ren), adult Name of Support/Comfort Primary Source: [name]  Medications Reviewed Today: Medications Reviewed Today     Reviewed by Charlyn Minerva, RN (Registered Nurse) on 05/02/23 at 1301  Med List Status: <None>   Medication Order Taking? Sig Documenting Provider Last Dose Status Informant  acetaminophen (TYLENOL) 500 MG tablet [id number] Yes Take 500 mg by mouth 3 (three) times daily as needed (for R.A. pain). [provider] Taking Active Self  atorvastatin (LIPITOR) 10 MG tablet [id number] Yes TAKE 1 TABLET DAILY  Patient taking differently: Take 10 mg by mouth at bedtime.   [name], MD Taking Active Self  Cholecalciferol (VITAMIN D3) 1000 units CAPS [id number] Yes Take 1,000 Units by mouth daily. [provider] Taking Active Self  cyanocobalamin 1000 MCG tablet [id number] Yes Take 1,000 mcg by mouth  daily. [provider] Taking Active Self  feeding supplement (ENSURE ENLIVE / ENSURE PLUS) LIQD [id number] Yes Take 237 mLs by mouth 2 (two) times daily between meals. Marguerita Merles [place], [place] Taking Active   fexofenadine (ALLEGRA) 60 MG tablet [id number] Yes TAKE 1 TABLET DAILY AS NEEDED FOR ALLERGIES OR RHINITIS [name], MD Taking Active   gabapentin (NEURONTIN) 400 MG capsule [id number] Yes TAKE 1 CAPSULE DAILY  Patient taking differently: Take 400 mg by mouth at bedtime.   [name], MD Taking Active Self  meclizine (ANTIVERT) 12.5 MG tablet [id number] Yes Take 1 tablet (12.5 mg total) by mouth 3 (three) times daily as needed for dizziness. [name], MD Taking Active Self  metoprolol tartrate (LOPRESSOR) 50 MG tablet [id number] Yes TAKE 1 TABLET TWICE A DAY [name], MD Taking Active   Multiple Vitamin (MULTIVITAMIN) capsule [id number] Yes Take 1 capsule by mouth daily with breakfast. [provider] Taking Active Self  omeprazole (PRILOSEC) 20 MG capsule [id number] Yes Take 20 mg by mouth daily before breakfast. [provider] Taking Active Self  predniSONE (DELTASONE) 5 MG tablet [id number] Yes Take 5 mg by mouth daily with breakfast. [provider] Taking Active Self  sertraline (ZOLOFT) 100 MG tablet [id number] Yes TAKE 1 TABLET DAILY  Patient taking differently: Take 100 mg by mouth in the morning.   [name], MD Taking Active Self  torsemide (DEMADEX) 20 MG tablet [id number] Yes Take 1 tablet (20 mg total) by mouth daily. Marguerita Merles [place], DO Taking Active   traMADol Janean Sark) 50 MG tablet [id number] Yes Take 2 tablets (100 mg) in a.m., 1 tablet (50 mg) at lunch, and 1 tablet (50 mg) in evening as needed for moderate to severe pain from  arthritis and hip fracture.  Patient taking differently: Take 50-100 mg by mouth See admin instructions. Take 100 mg by mouth in the morning and 50 mg in the afternoon & at bedtime  (scheduled)   [name], MD Taking Active Self  warfarin (COUMADIN) 5 MG tablet [id number] Yes Take 2.5-5 mg by mouth See admin instructions. Take 2.5 mg by mouth at bedtime on Sun/Mon/Tues/Wed/Fri/Sat and 5 mg on Thursdays [provider] Taking Active Self  Med List Note Carola Rhine, [place] 07[birth year] 1733): Pt requests for 90  Days supply for Rx going to [facility]            Home Care and Equipment/Supplies: Were Home Health Services Ordered?: Yes Name of Home Health Agency:: [facility]-pt had services prior to admission and services resumed Has Agency set up a time to come to your home?: Yes First Home Health Visit Date: 05/02/23 (Daughter confirms HHPT came out to see pt today) Any new equipment or medical supplies ordered?: NA  Functional Questionnaire: Do you need assistance with bathing/showering or dressing?: Yes (daughter assists with ADLs) Do you need assistance with meal preparation?: Yes Do you need assistance with eating?: No Do you have difficulty maintaining continence: No Do you need assistance with getting out of bed/getting out of a chair/moving?: No Do you have difficulty managing or taking your medications?: Yes  Follow up appointments reviewed: PCP Follow-up appointment confirmed?: No (daughter declined-following up with cardiology) MD Provider Line [phone] Given: No Specialist Hospital Follow-up appointment confirmed?: Yes Date of Specialist follow-up appointment?: 05/13/23 Follow-Up Specialty Provider:: Samara Deist [place]-cardiology Do you need transportation to your follow-up appointment?: No Do you understand care options if your condition(s) worsen?: Yes-patient verbalized understanding  SDOH Interventions Today    Flowsheet Row Most Recent Value  SDOH Interventions   Food Insecurity Interventions Intervention Not Indicated  Transportation Interventions Intervention Not Indicated      Interventions Today     Flowsheet Row Most Recent Value  Chronic Disease   Chronic disease during today's visit Congestive Heart Failure (CHF), Atrial Fibrillation (AFib)  General Interventions   General Interventions Discussed/Reviewed General Interventions Discussed, Doctor Visits, Durable Medical Equipment (DME)  Doctor Visits Discussed/Reviewed Doctor Visits Discussed, PCP, Specialist  Durable Medical Equipment (DME) BP Cuff, Other, Dan Humphreys  [scale-discussed with daughter wgt monitoring & recording-advised to track and take log to MD appts]  PCP/Specialist Visits Compliance with follow-up visit  Education Interventions   Education Provided Provided Education  Provided Verbal Education On Nutrition, When to see the doctor  Nutrition Interventions   Nutrition Discussed/Reviewed Nutrition Discussed, Fluid intake, Decreasing sugar intake, Increasing proteins, Decreasing fats, Decreasing salt, Adding fruits and vegetables, Supplemental nutrition  Pharmacy Interventions   Pharmacy Dicussed/Reviewed Pharmacy Topics Discussed, Medications and their functions  Safety Interventions   Safety Discussed/Reviewed Safety Discussed, Home Safety  Home Safety Assistive Devices      TOC Interventions Today    Flowsheet Row Most Recent Value  TOC Interventions   TOC Interventions Discussed/Reviewed TOC Interventions Discussed       Antionette Fairy, RN,BSN,CCM [facility] Care Management Care Management Community Coordinator Direct Phone: [phone] Toll Free: [phone] Fax: [phone]

## 2023-05-07 ENCOUNTER — Other Ambulatory Visit: Payer: Self-pay | Admitting: Family Medicine

## 2023-05-07 DIAGNOSIS — F419 Anxiety disorder, unspecified: Secondary | ICD-10-CM

## 2023-05-08 ENCOUNTER — Other Ambulatory Visit: Payer: Self-pay | Admitting: Family Medicine

## 2023-05-08 DIAGNOSIS — M0579 Rheumatoid arthritis with rheumatoid factor of multiple sites without organ or systems involvement: Secondary | ICD-10-CM | POA: Diagnosis not present

## 2023-05-08 DIAGNOSIS — J918 Pleural effusion in other conditions classified elsewhere: Secondary | ICD-10-CM | POA: Diagnosis not present

## 2023-05-08 DIAGNOSIS — I4811 Longstanding persistent atrial fibrillation: Secondary | ICD-10-CM | POA: Diagnosis not present

## 2023-05-08 DIAGNOSIS — I5031 Acute diastolic (congestive) heart failure: Secondary | ICD-10-CM | POA: Diagnosis not present

## 2023-05-08 DIAGNOSIS — I10 Essential (primary) hypertension: Secondary | ICD-10-CM | POA: Diagnosis not present

## 2023-05-08 DIAGNOSIS — I739 Peripheral vascular disease, unspecified: Secondary | ICD-10-CM | POA: Diagnosis not present

## 2023-05-09 DIAGNOSIS — I739 Peripheral vascular disease, unspecified: Secondary | ICD-10-CM | POA: Diagnosis not present

## 2023-05-09 DIAGNOSIS — J918 Pleural effusion in other conditions classified elsewhere: Secondary | ICD-10-CM | POA: Diagnosis not present

## 2023-05-09 DIAGNOSIS — I10 Essential (primary) hypertension: Secondary | ICD-10-CM | POA: Diagnosis not present

## 2023-05-09 DIAGNOSIS — I4811 Longstanding persistent atrial fibrillation: Secondary | ICD-10-CM | POA: Diagnosis not present

## 2023-05-09 DIAGNOSIS — I5031 Acute diastolic (congestive) heart failure: Secondary | ICD-10-CM | POA: Diagnosis not present

## 2023-05-09 DIAGNOSIS — M0579 Rheumatoid arthritis with rheumatoid factor of multiple sites without organ or systems involvement: Secondary | ICD-10-CM | POA: Diagnosis not present

## 2023-05-11 NOTE — Progress Notes (Unsigned)
Cardiology Clinic Note   Patient Name: Stacey [name] Date of Encounter: 05/11/2023  Primary Care Provider:  [name], MD Primary Cardiologist:  Maisie Fus, MD  Patient Profile    89 y.o. female with history of LSP AF on coumadin (GI bleed with Xarelto), with chronic HFpEF, hyperlipidemia, adrenal insufficiency, rheumatoid arthritis, bilateral subclavian stenosis.  Recent hospitalization for acute on chronic HFpEF, from 04/22/2023 through 04/30/2023.  Echocardiogram revealing normal LVEF of 60 to 65% with severe TR and moderate MR.  She was taken off of torsemide due to hypotension but became hypervolemic.  She was started back on torsemide 20 mg daily.   Past Medical History    Past Medical History:  Diagnosis Date   Age-related osteoporosis without current pathological fracture    Per [facility], [facility]   Atrial fibrillation (HCC)    Chronic diastolic heart failure (HCC)    Per [facility]MR system, [facility]   Diaphragmatic hernia without obstruction or gangrene    Per [facility] system, [facility]   Fracture of left superior rim of pubis with routine healing    Per [facility]MR system, [facility] [facility]   Gastroesophageal reflux disease    Per [facility]MR system, [facility] [facility]   Hernia of abdominal cavity    History of fall    Per [facility]MR system, [employer] [facility]   Hyperlipemia    Per [facility]MR system, [facility] [facility]   Kyphosis    Polyneuropathy    Per [facility]MR system, [facility] [facility]   RA (rheumatoid arthritis) (HCC)    Per [facility]MR system, [facility] [facility]   Past Surgical History:  Procedure Laterality Date   ABDOMINAL HYSTERECTOMY     CATARACT EXTRACTION, BILATERAL     REPLACEMENT TOTAL HIP W/  RESURFACING IMPLANTS     TONSILLECTOMY      Allergies  Allergies  Allergen Reactions   Sulfa Antibiotics Anaphylaxis   Tape Other (See Comments)    BRUISES VERY EASILY!!!!    Codeine Nausea And Vomiting   Hydroxychloroquine Other (See Comments)    GI upset   Shellfish-Derived Products Other (See Comments)    Daughter was unsure of the reaction- "happened years ago" and patient avoids foods that contain shellfish   Latex Rash   Neosporin Original [Bacitracin-Neomycin-Polymyxin] Rash   Polysporin [Bacitracin-Polymyxin B] Rash    History of Present Illness    Stacey [name] returns for posthospitalization follow-up after being admitted for acute on chronic diastolic heart failure, with history of chronic atrial fibrillation and PVD.  She was diuresed became hypotensive but was able to be restarted on torsemide 20 mg daily.  She was continued on anticoagulation therapy on Coumadin.  Home Medications    Current Outpatient Medications  Medication Sig Dispense Refill   acetaminophen (TYLENOL) 500 MG tablet Take 500 mg by mouth 3 (three) times daily as needed (for R.A. pain).     atorvastatin (LIPITOR) 10 MG tablet TAKE 1 TABLET DAILY (Patient taking differently: Take 10 mg by mouth at bedtime.) 90 tablet 3   Cholecalciferol (VITAMIN D3) 1000 units CAPS Take 1,000 Units by mouth daily.     cyanocobalamin 1000 MCG tablet Take 1,000 mcg by mouth daily.     feeding supplement (ENSURE ENLIVE / ENSURE PLUS) LIQD Take 237 mLs by mouth 2 (two) times daily between meals. 237 mL 12   fexofenadine (  ALLEGRA) 60 MG tablet TAKE 1 TABLET DAILY AS NEEDED FOR ALLERGIES OR RHINITIS 90 tablet 1   gabapentin (NEURONTIN) 400 MG capsule TAKE 1 CAPSULE DAILY (Patient taking differently: Take 400 mg by mouth at bedtime.) 90 capsule 3   meclizine (ANTIVERT) 12.5 MG tablet Take 1 tablet (12.5 mg total) by mouth 3 (three) times daily as needed for dizziness. 30 tablet 0   metoprolol tartrate (LOPRESSOR) 50 MG tablet TAKE 1 TABLET TWICE A DAY 90 tablet 1   Multiple Vitamin (MULTIVITAMIN) capsule Take 1 capsule by mouth daily with breakfast.     omeprazole (PRILOSEC) 20 MG capsule Take 20 mg by  mouth daily before breakfast.     predniSONE (DELTASONE) 5 MG tablet Take 5 mg by mouth daily with breakfast.     sertraline (ZOLOFT) 100 MG tablet TAKE 1 TABLET DAILY (Patient taking differently: Take 100 mg by mouth in the morning.) 90 tablet 3   torsemide (DEMADEX) 20 MG tablet Take 1 tablet (20 mg total) by mouth daily. 30 tablet 0   traMADol (ULTRAM) 50 MG tablet Take 2 tablets (100 mg) in a.m., 1 tablet (50 mg) at lunch, and 1 tablet (50 mg) in evening as needed for moderate to severe pain from arthritis and hip fracture. (Patient taking differently: Take 50-100 mg by mouth See admin instructions. Take 100 mg by mouth in the morning and 50 mg in the afternoon & at bedtime (scheduled)) 120 tablet 4   warfarin (COUMADIN) 5 MG tablet Take 2.5-5 mg by mouth See admin instructions. Take 2.5 mg by mouth at bedtime on Sun/Mon/Tues/Wed/Fri/Sat and 5 mg on Thursdays     No current facility-administered medications for this visit.     Family History    Family History  Problem Relation Age of Onset   Healthy Mother    Heart failure Father    She indicated that her mother is deceased. She indicated that her father is deceased.  Social History    Social History   Socioeconomic History   Marital status: Widowed    Spouse name: Not on file   Number of children: Not on file   Years of education: Not on file   Highest education level: Not on file  Occupational History   Not on file  Tobacco Use   Smoking status: Former   Smokeless tobacco: Never  Vaping Use   Vaping status: Never Used  Substance and Sexual Activity   Alcohol use: No   Drug use: No   Sexual activity: Not on file  Other Topics Concern   Not on file  Social History Narrative   Not on file   Social Determinants of Health   Financial Resource Strain: Low Risk  (08/31/2021)   Overall Financial Resource Strain (CARDIA)    Difficulty of Paying Living Expenses: Not hard at all  Food Insecurity: No Food Insecurity  (05/02/2023)   Hunger Vital Sign    Worried About Running Out of Food in the Last Year: Never true    Ran Out of Food in the Last Year: Never true  Transportation Needs: No Transportation Needs (05/02/2023)   PRAPARE - Administrator, Civil Service (Medical): No    Lack of Transportation (Non-Medical): No  Physical Activity: Inactive (08/31/2021)   Exercise Vital Sign    Days of Exercise per Week: 0 days    Minutes of Exercise per Session: 0 min  Stress: No Stress Concern Present (08/31/2021)   [employer] of Occupational Health -  Occupational Stress Questionnaire    Feeling of Stress : Not at all  Social Connections: Socially Isolated (08/31/2021)   Social Connection and Isolation Panel [NHANES]    Frequency of Communication with Friends and Family: More than three times a week    Frequency of Social Gatherings with Friends and Family: More than three times a week    Attends Religious Services: Never    Database administrator or Organizations: No    Attends Banker Meetings: Never    Marital Status: Widowed  Intimate Partner Violence: Not At Risk (04/23/2023)   Humiliation, Afraid, Rape, and Kick questionnaire    Fear of Current or Ex-Partner: No    Emotionally Abused: No    Physically Abused: No    Sexually Abused: No     Review of Systems    General:  No chills, fever, night sweats or weight changes.  Cardiovascular:  No chest pain, dyspnea on exertion, edema, orthopnea, palpitations, paroxysmal nocturnal dyspnea. Dermatological: No rash, lesions/masses Respiratory: No cough, dyspnea Urologic: No hematuria, dysuria Abdominal:   No nausea, vomiting, diarrhea, bright red blood per rectum, melena, or hematemesis Neurologic:  No visual changes, wkns, changes in mental status. All other systems reviewed and are otherwise negative except as noted above.       Physical Exam    VS:  There were no vitals taken for this visit. , BMI There is no height or  weight on file to calculate BMI.     GEN: Well nourished, well developed, in no acute distress. HEENT: normal. Neck: Supple, no JVD, carotid bruits, or masses. Cardiac: RRR, no murmurs, rubs, or gallops. No clubbing, cyanosis, edema.  Radials/DP/PT 2+ and equal bilaterally.  Respiratory:  Respirations regular and unlabored, clear to auscultation bilaterally. GI: Soft, nontender, nondistended, BS + x 4. MS: no deformity or atrophy. Skin: warm and dry, no rash. Neuro:  Strength and sensation are intact. Psych: Normal affect.      Lab Results  Component Value Date   WBC 4.1 04/30/2023   HGB 10.8 (L) 04/30/2023   HCT 35.8 (L) 04/30/2023   MCV 97.0 04/30/2023   PLT 216 04/30/2023   Lab Results  Component Value Date   CREATININE 0.44 04/30/2023   BUN 20 04/30/2023   NA 135 04/30/2023   K 3.6 04/30/2023   CL 101 04/30/2023   CO2 28 04/30/2023   Lab Results  Component Value Date   ALT 24 03/13/2023   AST 34 03/13/2023   ALKPHOS 72 03/13/2023   BILITOT 1.3 (H) 03/13/2023   No results found for: "CHOL", "HDL", "LDLCALC", "LDLDIRECT", "TRIG", "CHOLHDL"  No results found for: "HGBA1C"   Review of Prior Studies    Echocardiogram 04/23/2023  1. Left ventricular ejection fraction, by estimation, is 60 to 65%. The  left ventricle has normal function. The left ventricle has no regional  wall motion abnormalities. Left ventricular diastolic parameters are  indeterminate.   2. Right ventricular systolic function is mildly reduced. The right  ventricular size is moderately enlarged.   3. Left atrial size was severely dilated.   4. Right atrial size was severely dilated.   5. The mitral valve is myxomatous. Moderate mitral valve regurgitation.   6. Tricuspid valve regurgitation is severe.   7. There is moderate calcification of the aortic valve. Aortic valve  regurgitation is not visualized.   8. The inferior vena cava is normal in size with greater than 50%  respiratory  variability, suggesting right atrial  pressure of 3 mmHg.    Assessment & Plan   1.  ***     {Are you ordering a CV Procedure (e.g. stress test, cath, DCCV, TEE, etc)?   Press F2        :[id number]}   Signed, Bettey Mare. [name], ANP, AACC   05/11/2023 3:31 PM      Office [phone] Fax [phone]  Notice: This dictation was prepared with Dragon dictation along with smaller phrase technology. Any transcriptional errors that result from this process are unintentional and may not be corrected upon review.

## 2023-05-12 ENCOUNTER — Other Ambulatory Visit (HOSPITAL_COMMUNITY): Payer: Self-pay

## 2023-05-13 ENCOUNTER — Ambulatory Visit (INDEPENDENT_AMBULATORY_CARE_PROVIDER_SITE_OTHER): Payer: Self-pay

## 2023-05-13 ENCOUNTER — Ambulatory Visit: Payer: Medicare Other | Attending: Adult Health | Admitting: Adult Health

## 2023-05-13 ENCOUNTER — Encounter: Payer: Self-pay | Admitting: Adult Health

## 2023-05-13 VITALS — BP 96/52 | HR 80 | Ht 63.0 in | Wt 83.8 lb

## 2023-05-13 DIAGNOSIS — I10 Essential (primary) hypertension: Secondary | ICD-10-CM | POA: Diagnosis not present

## 2023-05-13 DIAGNOSIS — E7800 Pure hypercholesterolemia, unspecified: Secondary | ICD-10-CM

## 2023-05-13 DIAGNOSIS — Z79899 Other long term (current) drug therapy: Secondary | ICD-10-CM | POA: Diagnosis not present

## 2023-05-13 DIAGNOSIS — I5032 Chronic diastolic (congestive) heart failure: Secondary | ICD-10-CM | POA: Diagnosis not present

## 2023-05-13 DIAGNOSIS — R5381 Other malaise: Secondary | ICD-10-CM | POA: Diagnosis not present

## 2023-05-13 DIAGNOSIS — M40292 Other kyphosis, cervical region: Secondary | ICD-10-CM | POA: Diagnosis not present

## 2023-05-13 DIAGNOSIS — Z7901 Long term (current) use of anticoagulants: Secondary | ICD-10-CM

## 2023-05-13 DIAGNOSIS — I4821 Permanent atrial fibrillation: Secondary | ICD-10-CM | POA: Diagnosis not present

## 2023-05-13 LAB — POCT INR: INR: 1.3 — AB (ref 2.0–3.0)

## 2023-05-13 NOTE — Progress Notes (Signed)
Patient's daughter, Stacey [name], helps test INR with home [employer] analyzer and submitted results via portal.  Pt was in hospital for acute pulmonary edema from 9/10-9/18. Pt's INR was therapeutic while hospitalized.  Increase dose today to take 1 tablet and then change weekly dosing to take 1/2 tablet daily except take 1 tablet on Mondays and Thursdays. Recheck in 1 week, on 05/20/23. LVM for pt's daughter with dosing instructions and recheck in 1 week.

## 2023-05-13 NOTE — Patient Instructions (Addendum)
Pre visit review using our clinic review tool, if applicable. No additional management support is needed unless otherwise documented below in the visit note.  Increase dose today to take 1 tablet and then change weekly dosing to take 1/2 tablet daily except take 1 tablet on Mondays and Thursdays. Recheck in 1 week, on 05/20/23.

## 2023-05-13 NOTE — Patient Instructions (Signed)
Medication Instructions:  No Changes *If you need a refill on your cardiac medications before your next appointment, please call your pharmacy*   Lab Work: BMET If you have labs (blood work) drawn today and your tests are completely normal, you will receive your results only by: MyChart Message (if you have MyChart) OR A paper copy in the mail If you have any lab test that is abnormal or we need to change your treatment, we will call you to review the results.   Testing/Procedures: No Testing   Follow-Up: At [facility], you and your health needs are our priority.  As part of our continuing mission to provide you with exceptional heart care, we have created designated Provider Care Teams.  These Care Teams include your primary Cardiologist (physician) and Advanced Practice Providers (APPs -  Physician Assistants and Nurse Practitioners) who all work together to provide you with the care you need, when you need it.  We recommend signing up for the patient portal called "MyChart".  Sign up information is provided on this After Visit Summary.  MyChart is used to connect with patients for Virtual Visits (Telemedicine).  Patients are able to view lab/test results, encounter notes, upcoming appointments, etc.  Non-urgent messages can be sent to your provider as well.   To learn more about what you can do with MyChart, go to [email or web address].    Your next appointment:   Keep Scheduled Appointment  Provider:   Maisie Fus, MD

## 2023-05-14 ENCOUNTER — Telehealth: Payer: Self-pay | Admitting: Family Medicine

## 2023-05-14 DIAGNOSIS — I5031 Acute diastolic (congestive) heart failure: Secondary | ICD-10-CM | POA: Diagnosis not present

## 2023-05-14 DIAGNOSIS — I739 Peripheral vascular disease, unspecified: Secondary | ICD-10-CM | POA: Diagnosis not present

## 2023-05-14 DIAGNOSIS — I4811 Longstanding persistent atrial fibrillation: Secondary | ICD-10-CM | POA: Diagnosis not present

## 2023-05-14 DIAGNOSIS — I10 Essential (primary) hypertension: Secondary | ICD-10-CM | POA: Diagnosis not present

## 2023-05-14 DIAGNOSIS — M0579 Rheumatoid arthritis with rheumatoid factor of multiple sites without organ or systems involvement: Secondary | ICD-10-CM | POA: Diagnosis not present

## 2023-05-14 DIAGNOSIS — J918 Pleural effusion in other conditions classified elsewhere: Secondary | ICD-10-CM | POA: Diagnosis not present

## 2023-05-14 LAB — BASIC METABOLIC PANEL
BUN/Creatinine Ratio: 26 (ref 12–28)
BUN: 17 mg/dL (ref 8–27)
CO2: 31 mmol/L — ABNORMAL HIGH (ref 20–29)
Calcium: 9.3 mg/dL (ref 8.7–10.3)
Chloride: 96 mmol/L (ref 96–106)
Creatinine, Ser: 0.66 mg/dL (ref 0.57–1.00)
Glucose: 142 mg/dL — ABNORMAL HIGH (ref 70–99)
Potassium: 3.9 mmol/L (ref 3.5–5.2)
Sodium: 138 mmol/L (ref 134–144)
eGFR: 84 mL/min/{1.73_m2} (ref >59)

## 2023-05-14 NOTE — Telephone Encounter (Signed)
Returned call to pt and her daughter, [name], and explained warfarin dosing and recheck date. Pt is doing well now that she is back home. No reason for subtherapeutic INR that either can think of.

## 2023-05-14 NOTE — Telephone Encounter (Signed)
Patient returned Stacey [name]'s call about her results and would like a call back at [phone].

## 2023-05-15 ENCOUNTER — Telehealth: Payer: Self-pay

## 2023-05-15 NOTE — Telephone Encounter (Addendum)
Called patient regarding results. Left message for patient to call office.---- Message from Joni Reining sent at 05/14/2023  4:58 PM EDT ----- I have reviewed her labs. They are essentially normal with the exception of the glucose. This was not done fasting. Continue the current regimen. CO2 was a little high but not extremely so. Should take big breaths on occasion since she is so sedentary.

## 2023-05-17 DIAGNOSIS — I10 Essential (primary) hypertension: Secondary | ICD-10-CM | POA: Diagnosis not present

## 2023-05-17 DIAGNOSIS — I4811 Longstanding persistent atrial fibrillation: Secondary | ICD-10-CM | POA: Diagnosis not present

## 2023-05-17 DIAGNOSIS — I5031 Acute diastolic (congestive) heart failure: Secondary | ICD-10-CM | POA: Diagnosis not present

## 2023-05-17 DIAGNOSIS — M0579 Rheumatoid arthritis with rheumatoid factor of multiple sites without organ or systems involvement: Secondary | ICD-10-CM | POA: Diagnosis not present

## 2023-05-17 DIAGNOSIS — J918 Pleural effusion in other conditions classified elsewhere: Secondary | ICD-10-CM | POA: Diagnosis not present

## 2023-05-17 DIAGNOSIS — I739 Peripheral vascular disease, unspecified: Secondary | ICD-10-CM | POA: Diagnosis not present

## 2023-05-20 ENCOUNTER — Ambulatory Visit (INDEPENDENT_AMBULATORY_CARE_PROVIDER_SITE_OTHER): Payer: Self-pay

## 2023-05-20 DIAGNOSIS — Z7901 Long term (current) use of anticoagulants: Secondary | ICD-10-CM | POA: Diagnosis not present

## 2023-05-20 DIAGNOSIS — I4821 Permanent atrial fibrillation: Secondary | ICD-10-CM | POA: Diagnosis not present

## 2023-05-20 LAB — POCT INR: INR: 1.1 — AB (ref 2.0–3.0)

## 2023-05-20 NOTE — Patient Instructions (Addendum)
Pre visit review using our clinic review tool, if applicable. No additional management support is needed unless otherwise documented below in the visit note.   Increase dose today to take 1 tablet and increase dose tomorrow to take 1 tablet and then change weekly dosing to take 1 daily except take 1/2 tablet on Monday, Wednesday and Friday. Recheck in 1 week, on 05/27/23.

## 2023-05-20 NOTE — Progress Notes (Signed)
Patient's daughter, [name], helps test INR with home [employer] analyzer and submitted results via portal.  Last week INR was 1.3 with no discernible cause. INR today is 1.1. Pt's daughter, [name], reports only change is pt is using flonase now. Advised no interactions with this.  Increase dose today to take 1 tablet and increase dose tomorrow to take 1 tablet and then change weekly dosing to take 1 daily except take 1/2 tablet on Monday, Wednesday and Friday. Recheck in 1 week, on 05/27/23. Contacted pt and her daughter. Pt wrote dosing instructions down and read back. Pt verbalized understanding.

## 2023-05-26 ENCOUNTER — Other Ambulatory Visit: Payer: Self-pay | Admitting: Family Medicine

## 2023-05-27 ENCOUNTER — Ambulatory Visit (INDEPENDENT_AMBULATORY_CARE_PROVIDER_SITE_OTHER): Payer: Medicare Other

## 2023-05-27 DIAGNOSIS — Z7901 Long term (current) use of anticoagulants: Secondary | ICD-10-CM | POA: Diagnosis not present

## 2023-05-27 LAB — POCT INR: INR: 2.2 (ref 2.0–3.0)

## 2023-05-27 NOTE — Patient Instructions (Addendum)
Pre visit review using our clinic review tool, if applicable. No additional management support is needed unless otherwise documented below in the visit note.  Continue 1 daily except take 1/2 tablet on Monday, Wednesday and Friday. Recheck in 2 week, on 06/10/23.

## 2023-05-27 NOTE — Progress Notes (Signed)
Patient's daughter, [name], helps test INR with home [employer] analyzer and submitted results via portal.  Continue 1 daily except take 1/2 tablet on Monday, Wednesday and Friday. Recheck in 2 week, on 06/10/23. Contacted pt and her daughter. Pt wrote dosing instructions down and read back. Pt verbalized understanding.

## 2023-05-29 ENCOUNTER — Telehealth: Payer: Self-pay

## 2023-05-29 NOTE — Telephone Encounter (Addendum)
Letter mailed to patient on 05/29/23.----- Message from Joni Reining sent at 05/14/2023  4:58 PM EDT ----- I have reviewed her labs. They are essentially normal with the exception of the glucose. This was not done fasting. Continue the current regimen. CO2 was a little high but not extremely so. Should take big breaths on occasion since she is so sedentary.

## 2023-06-03 ENCOUNTER — Ambulatory Visit (INDEPENDENT_AMBULATORY_CARE_PROVIDER_SITE_OTHER): Payer: Medicare Other | Admitting: Podiatry

## 2023-06-03 ENCOUNTER — Encounter: Payer: Self-pay | Admitting: Podiatry

## 2023-06-03 DIAGNOSIS — I739 Peripheral vascular disease, unspecified: Secondary | ICD-10-CM | POA: Diagnosis not present

## 2023-06-03 DIAGNOSIS — M79675 Pain in left toe(s): Secondary | ICD-10-CM

## 2023-06-03 DIAGNOSIS — B351 Tinea unguium: Secondary | ICD-10-CM

## 2023-06-03 DIAGNOSIS — M79674 Pain in right toe(s): Secondary | ICD-10-CM

## 2023-06-03 NOTE — Progress Notes (Signed)
  Subjective:  Patient ID: Stacey [name], female    DOB: 05-18-1933,   MRN: [id number]  Chief Complaint  Patient presents with   [facility]    RM#21 Patient is here for routine foot care patient states has no concerns at this time.    89 y.o. female presents for concern of thickened elongated and painful nails that are difficult to trim. Requesting to have them trimmed today. Relates burning and tingling in their feet. Patient has a history of PAD and on chronic anticoagulation.        PCP:  [name], MD    . Denies any other pedal complaints. Denies n/v/f/c.   Past Medical History:  Diagnosis Date   Age-related osteoporosis without current pathological fracture    Per [facility] EMR system, [facility]   Atrial fibrillation (HCC)    Chronic diastolic heart failure (HCC)    Per [facility] EMR system, [facility]   Diaphragmatic hernia without obstruction or gangrene    Per [facility] EMR system, [employer] [facility]   Fracture of left superior rim of pubis with routine healing    Per [facility] EMR system, [employer] [facility]   Gastroesophageal reflux disease    Per [facility] EMR system, [employer] [facility]   Hernia of abdominal cavity    History of fall    Per [facility] EMR system, [employer] [facility]   Hyperlipemia    Per [facility] EMR system, [employer] [facility]   Kyphosis    Polyneuropathy    Per [facility] EMR system, [employer] [facility]   RA (rheumatoid arthritis) (HCC)    Per [facility] EMR system, [facility]    Objective:  Physical Exam: Vascular: DP/PT pulses 2/4 bilateral. CFT <3 seconds. Normal hair growth on digits. No edema.  Skin. No lacerations or abrasions bilateral feet.Right heel ulcer healed   .Mild erythema surrounding, improved.  Right second digit -healed. No purulence noted. Nails 1-5 on the right and hallux nail on left are thickened and elongated with subungal debris.  Musculoskeletal: MMT 5/5 bilateral lower extremities in DF, PF,  Inversion and Eversion. Deceased ROM in DF of ankle joint. Tender to wound area.  Neurological: Sensation intact to light touch.   Assessment:   1. Pain due to onychomycosis of toenails of both feet   2. PAD (peripheral artery disease) (HCC)       Plan:  Patient was evaluated and treated and all questions answered. -Mechanically debrided all nails 1-5 bilateral using sterile nail nipper and filed with dremel without incident  -No open wounds today.  -Small pre-ulcerative area on dorsum of right second digit  covered with neosporin and bandaid.  -Answered all patient questions -Patient to return  in 3 months for at risk foot care -Patient advised to call the office if any problems or questions arise in the meantime.   Louann Sjogren, DPM

## 2023-06-06 NOTE — Plan of Care (Signed)
 [facility] Tonsillectomy/Adenoidectomy, Postoperative PEDS care plan entered in error.

## 2023-06-10 ENCOUNTER — Ambulatory Visit (INDEPENDENT_AMBULATORY_CARE_PROVIDER_SITE_OTHER): Payer: Self-pay

## 2023-06-10 DIAGNOSIS — Z7901 Long term (current) use of anticoagulants: Secondary | ICD-10-CM

## 2023-06-10 LAB — POCT INR: INR: 2 (ref 2.0–3.0)

## 2023-06-10 NOTE — Progress Notes (Signed)
Patient's daughter, [name], helps test INR with home [employer] analyzer and submitted results via portal.  Continue 1 daily except take 1/2 tablet on Monday, Wednesday and Friday. Recheck in 2 week, on 06/24/23. Contacted pt by phone. Pt wrote dosing instructions down and read back. Pt verbalized understanding. LVM for pt's daughter.

## 2023-06-10 NOTE — Patient Instructions (Addendum)
Pre visit review using our clinic review tool, if applicable. No additional management support is needed unless otherwise documented below in the visit note.  Continue 1 daily except take 1/2 tablet on Monday, Wednesday and Friday. Recheck in 2 week, on 06/24/23.

## 2023-06-12 ENCOUNTER — Encounter: Payer: Self-pay | Admitting: Internal Medicine

## 2023-06-12 ENCOUNTER — Other Ambulatory Visit: Payer: Self-pay | Admitting: Family Medicine

## 2023-06-12 ENCOUNTER — Other Ambulatory Visit (HOSPITAL_COMMUNITY): Payer: Self-pay

## 2023-06-12 DIAGNOSIS — Z7901 Long term (current) use of anticoagulants: Secondary | ICD-10-CM

## 2023-06-12 MED ORDER — WARFARIN SODIUM 5 MG PO TABS: ORAL_TABLET | ORAL | 1 refills | Status: DC

## 2023-06-12 NOTE — Telephone Encounter (Signed)
Pt is compliant with warfarin management and PCP apts.  Sent in refill of warfarin to requested pharmacy.  Sent refill to [facility] per pt's daughter request in a mychart msg.

## 2023-06-15 IMAGING — MR MR LUMBAR SPINE W/O CM
4 of 5 series · 18 of 48 positions shown · non-contrast
Comparison: Radiographs January 17, 2021

CLINICAL DATA: Low back pain with right-sided sciatica.

EXAM:
MRI LUMBAR SPINE WITHOUT CONTRAST
TECHNIQUE: Multiplanar, multisequence MR imaging of the lumbar spine was
performed. No intravenous contrast was administered.

[Series 3: T2 · sagittal · 4.0mm · 0.55mm/px · 8 of 28 slices shown (1 of 2)]
[im 1/28]
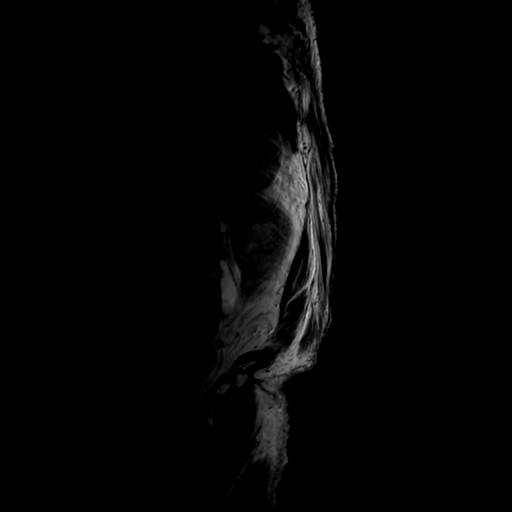
[im 4/28]
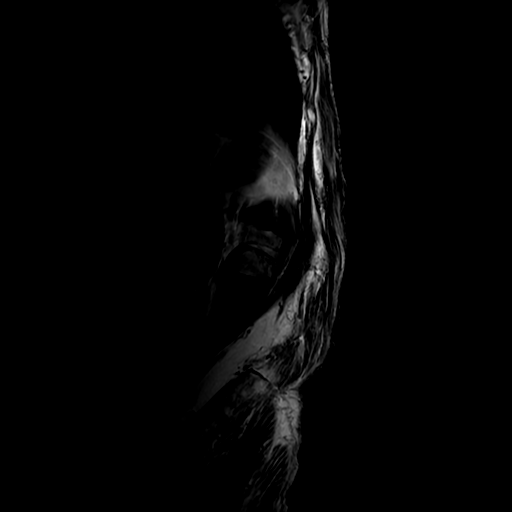
[im 8/28]
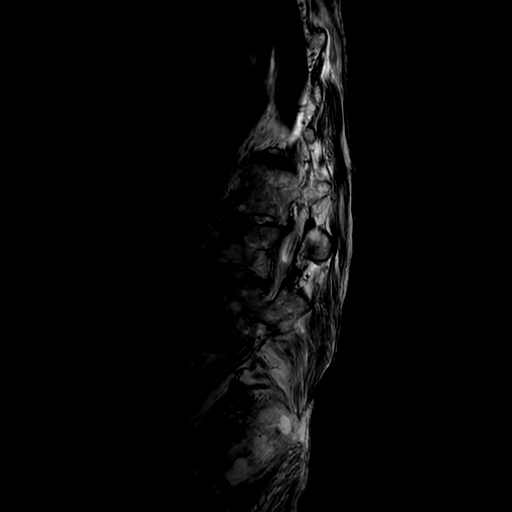
[im 12/28]
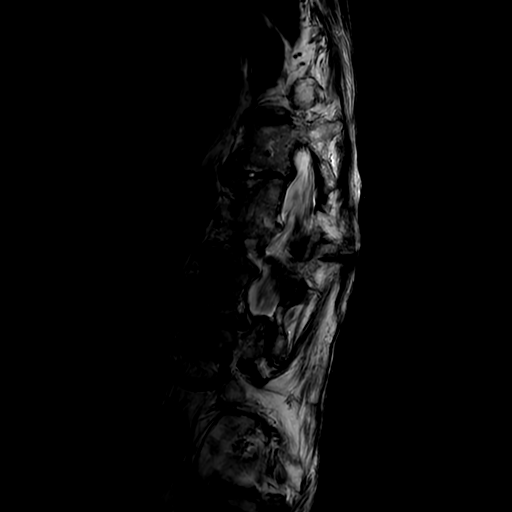
[im 16/28]
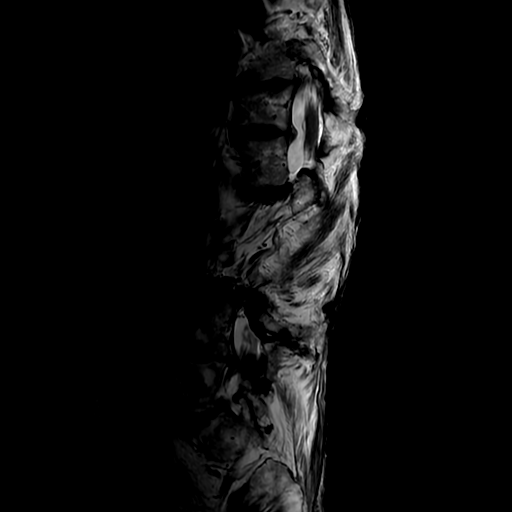
[im 20/28]
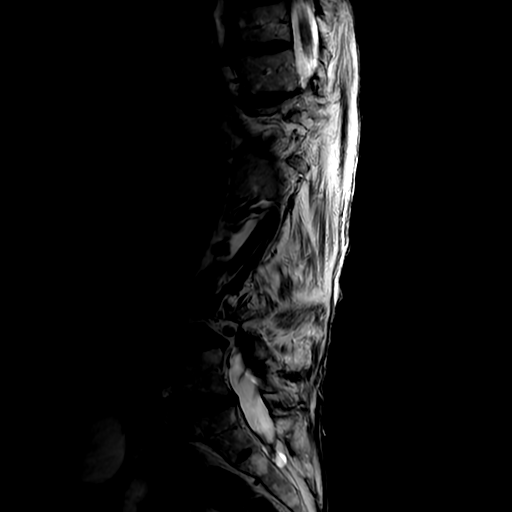
[im 24/28]
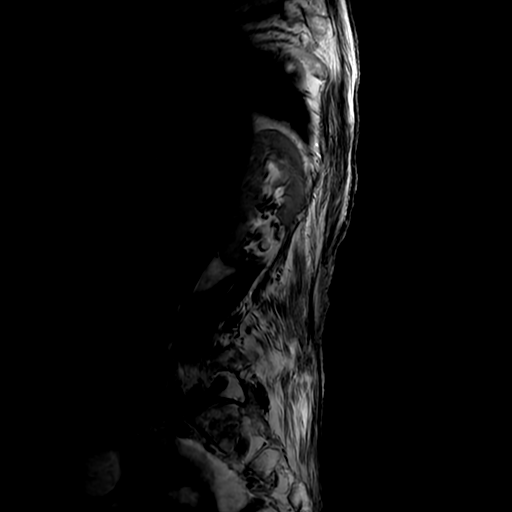
[im 28/28]
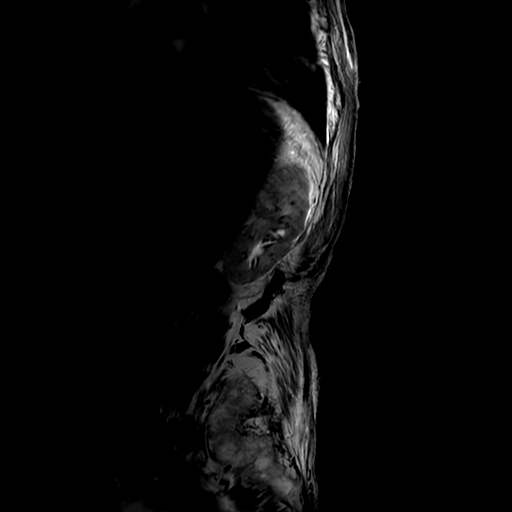

[Series 5: T1 · sagittal · 4.0mm · 0.51mm/px · 3 of 28 slices shown (1 of 2)]
[im 4/28]
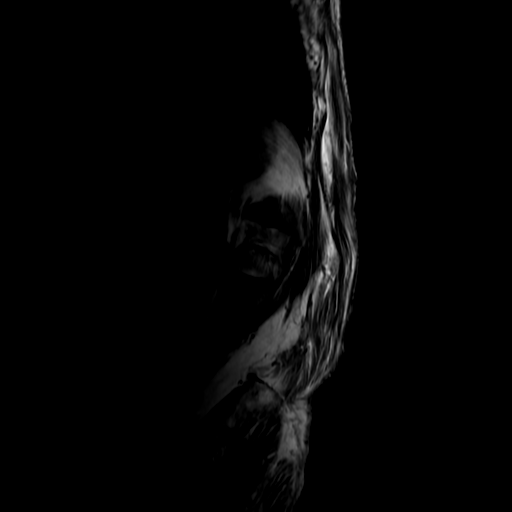
[im 16/28]
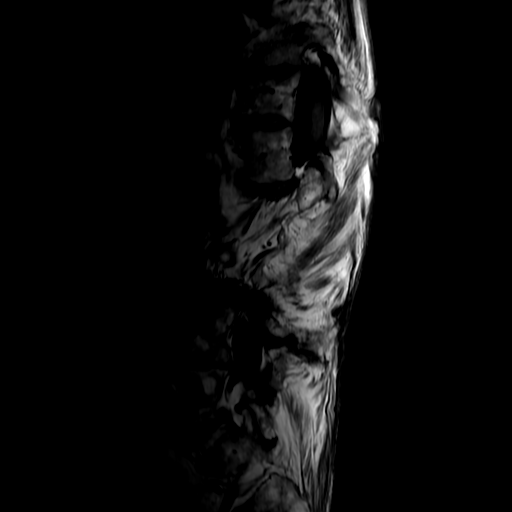
[im 24/28]
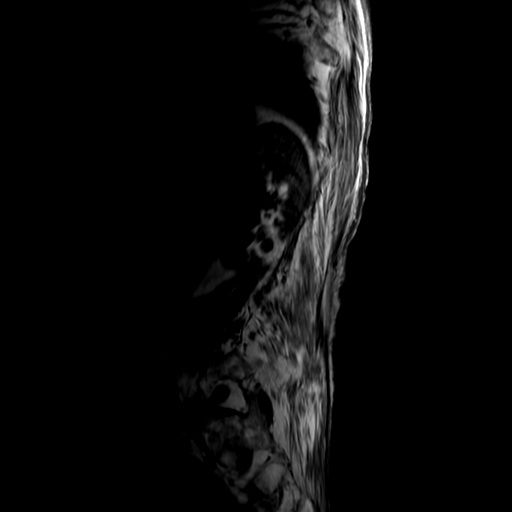

[Series 6: T2 · axial · 4.0mm · 0.39mm/px · z∈[-105,+69]mm · 4 of 40 slices shown (2 of 2)]
[im 1/40]
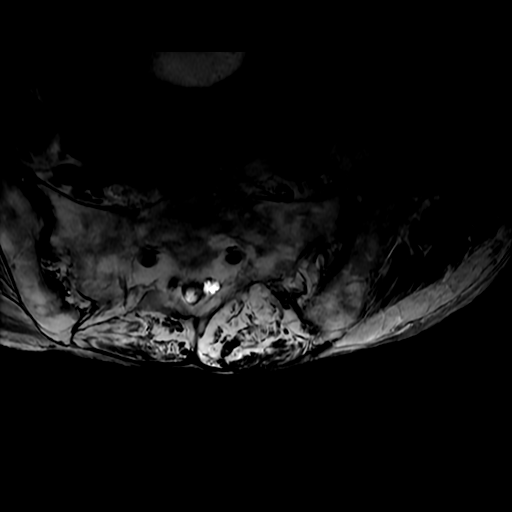
[im 8/40]
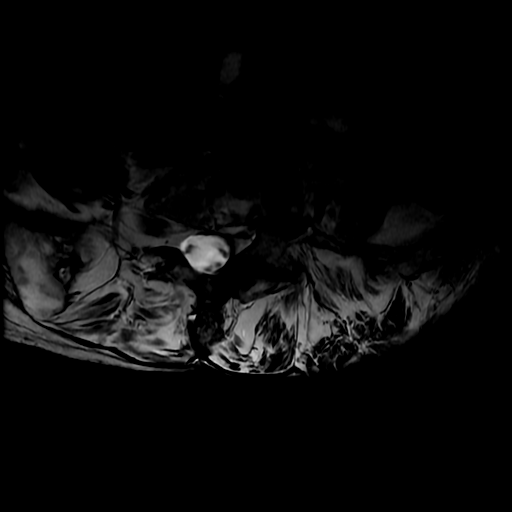
[im 22/40]
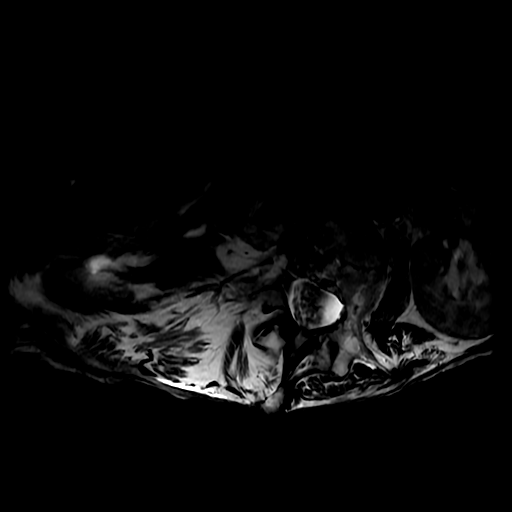
[im 36/40]
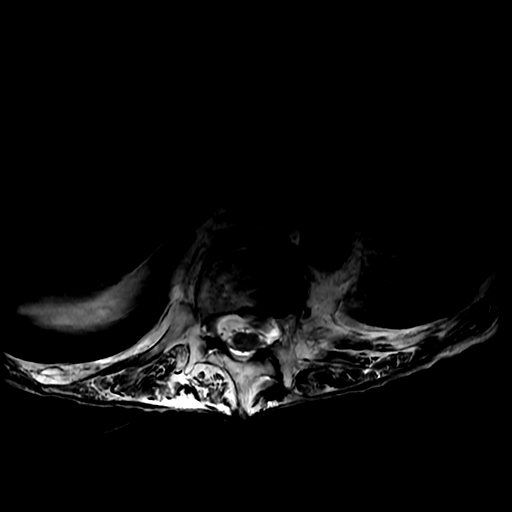

[Series 7: T1 · axial · 4.0mm · 0.39mm/px · z∈[-70,+69]mm · 3 of 40 slices shown (2 of 2)]
[im 8/40]
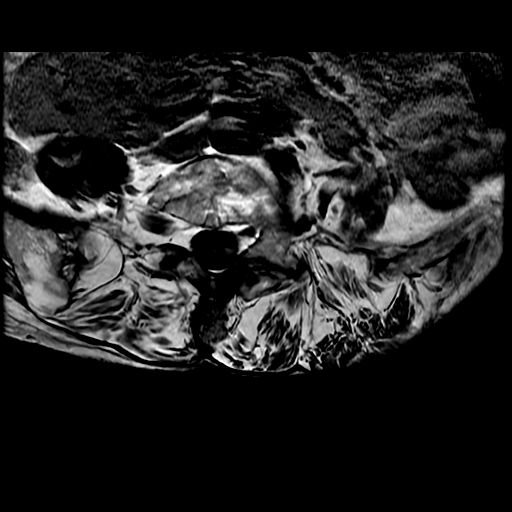
[im 22/40]
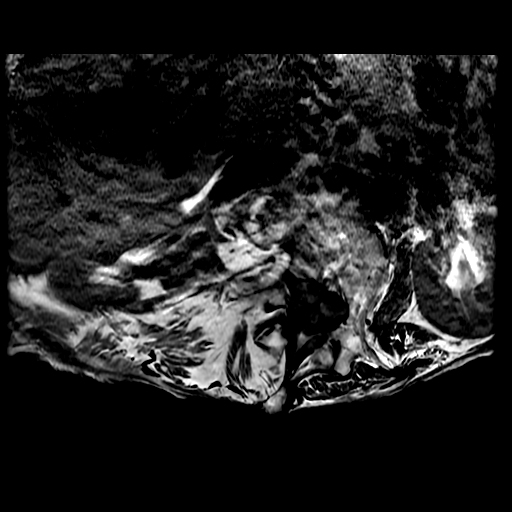
[im 36/40]
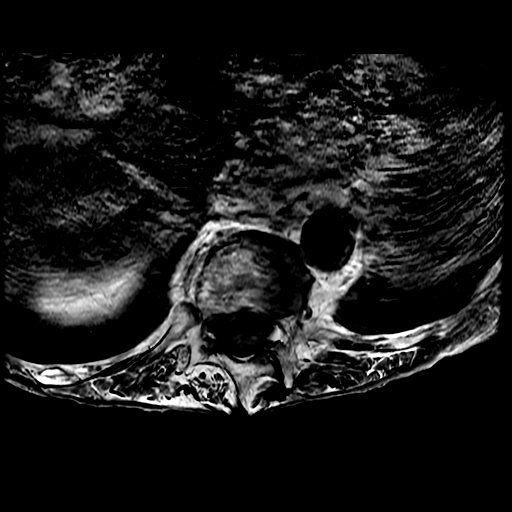

[18 of 48 positions shown; findings below may reference images not displayed]

FINDINGS: Segmentation:  Standard.

Alignment: Severe levoconvex scoliosis of the lumbar spine centered
on L2. Small retrolisthesis at T12-L1, L1-2 and L2-3 small
anterolisthesis at L3-4.

Vertebrae:  No fracture, evidence of discitis, or bone lesion.

Conus medullaris and cauda equina: Conus extends to the level. Conus
and cauda equina appear normal.

Paraspinal and other soft tissues: A T1 hyperintense, T2 hypointense
lesion in the right kidney measuring 1.4 cm, may represent
hemorrhagic cyst. Hiatal hernia.

Disc levels:

T11-12: Right central disc protrusion causing indentation on the
thecal sac and facet degenerative changes. No significant spinal
canal or neural foraminal stenosis.

T12-L1: Disc bulge and facet degenerative changes. Moderate right
neural foraminal narrowing. No significant spinal canal stenosis.

L1-2: Partial disc space fusion, right asymmetric disc bulge with
associated osteophytic component and facet degenerative changes
resulting in narrowing of the right subarticular zone and moderate
right neural foraminal narrowing.

L2-3: Right asymmetric disc bulge and facet degenerative changes
resulting in mild narrowing of the right subarticular zone and mild
right neural foraminal narrowing.

L3-4: Disc bulge, prominent facet degenerative changes resulting in
moderate spinal canal stenosis with narrowing of the right
subarticular zone, moderate to severe right and mild left neural
foraminal narrowing.

L4-5: Disc bulge, prominent facet degenerative changes, left greater
than right resulting in mild spinal canal stenosis with narrowing of
the left subarticular zone, mild right and moderate left neural
foraminal narrowing.

L5-S1: Shallow disc bulge and moderate facet degenerative changes,
left greater than right resulting in mild left neural foraminal
narrowing. No spinal canal stenosis.
IMPRESSION: Degenerative changes of the lumbar spine superimposed on a severe
levoconvex rotoscoliosis, more pronounced at L3-4 where there is
moderate spinal canal stenosis and moderate to severe on the right
neural foraminal narrowing.

## 2023-06-25 ENCOUNTER — Ambulatory Visit (INDEPENDENT_AMBULATORY_CARE_PROVIDER_SITE_OTHER): Payer: Medicare Other

## 2023-06-25 DIAGNOSIS — Z7901 Long term (current) use of anticoagulants: Secondary | ICD-10-CM | POA: Diagnosis not present

## 2023-06-25 LAB — POCT INR: INR: 2.2 (ref 2.0–3.0)

## 2023-06-25 NOTE — Patient Instructions (Addendum)
Pre visit review using our clinic review tool, if applicable. No additional management support is needed unless otherwise documented below in the visit note.  Continue 1 daily except take 1/2 tablet on Monday, Wednesday and Friday. Recheck in 3 week, on 07/16/23, due to holiday.

## 2023-06-25 NOTE — Progress Notes (Signed)
Patient's daughter, [name], helps test INR with home [employer] analyzer and submitted results via portal.  Continue 1 daily except take 1/2 tablet on Monday, Wednesday and Friday. Recheck in 3 week, on 07/16/23, due to holiday.  LVM for pt's daughter and pt.

## 2023-06-25 NOTE — Progress Notes (Signed)
I have reviewed and agree with note, evaluation, plan.   Jeffory Snelgrove, MD  

## 2023-07-08 ENCOUNTER — Other Ambulatory Visit: Payer: Self-pay | Admitting: Family Medicine

## 2023-07-08 DIAGNOSIS — I509 Heart failure, unspecified: Secondary | ICD-10-CM

## 2023-07-08 DIAGNOSIS — I482 Chronic atrial fibrillation, unspecified: Secondary | ICD-10-CM

## 2023-07-16 ENCOUNTER — Ambulatory Visit (INDEPENDENT_AMBULATORY_CARE_PROVIDER_SITE_OTHER): Payer: Medicare Other

## 2023-07-16 DIAGNOSIS — Z7901 Long term (current) use of anticoagulants: Secondary | ICD-10-CM

## 2023-07-16 DIAGNOSIS — I4821 Permanent atrial fibrillation: Secondary | ICD-10-CM | POA: Diagnosis not present

## 2023-07-16 LAB — POCT INR: INR: 2 (ref 2.0–3.0)

## 2023-07-16 NOTE — Patient Instructions (Addendum)
Pre visit review using our clinic review tool, if applicable. No additional management support is needed unless otherwise documented below in the visit note.  Continue 1 daily except take 1/2 tablet on Monday, Wednesday and Friday. Recheck in 2 week, on 07/30/23.

## 2023-07-16 NOTE — Progress Notes (Signed)
Patient's daughter, [name], helps test INR with home [employer] analyzer and submitted results via portal.  Continue 1 daily except take 1/2 tablet on Monday, Wednesday and Friday. Recheck in 2 week, on 07/30/23. Contacted pt's daughter, [name], and advised of dosing and recheck. [name] verbalized understanding and will advised pt of dosing.

## 2023-07-29 ENCOUNTER — Ambulatory Visit: Payer: Medicare Other | Attending: Internal Medicine | Admitting: Internal Medicine

## 2023-07-29 VITALS — BP 98/60 | HR 64 | Ht 62.0 in | Wt 82.8 lb

## 2023-07-29 DIAGNOSIS — I509 Heart failure, unspecified: Secondary | ICD-10-CM | POA: Diagnosis not present

## 2023-07-29 DIAGNOSIS — I482 Chronic atrial fibrillation, unspecified: Secondary | ICD-10-CM | POA: Insufficient documentation

## 2023-07-29 NOTE — Progress Notes (Signed)
Cardiology Office Note:    Date:  07/29/2023   ID:  Stacey [name], [place] Oct 18, 1932, MRN [id number]  PCP:  [name], MD   [facility] Providers Cardiologist:  Maisie Fus, MD     Referring MD: [name], MD   No chief complaint on file. Follow-up  History of Present Illness:    Stacey [name] is a 89 y.o. female with a hx of persistent atrial fibrillation, chronic anticoagulation (Xarelto then GI bleed then Coumadin), chronic diastolic heart failure, bilateral subclavian stenosis, referral for FU. She saw Dr. Katrinka Blazing in the past.   She feels well today. She denies palpitations. She notes some sharp CP in the evening. Does not last long.  Her diuretic works for leg swelling. No PND. No bruising  Interim hx 07/29/2023 Stacey [name] is doing well. She denies SOB. She is having to use the bathroom a lot with her diuretic, however she prefers this over dealing with dyspnea.  Past Medical History:  Diagnosis Date   Age-related osteoporosis without current pathological fracture    Per [facility] EMR system, [facility]   Atrial fibrillation (HCC)    Chronic diastolic heart failure (HCC)    Per [facility] EMR system, [facility]   Diaphragmatic hernia without obstruction or gangrene    Per [facility] EMR system, [employer] [facility]   Fracture of left superior rim of pubis with routine healing    Per [facility] EMR system, [employer] [facility]   Gastroesophageal reflux disease    Per [facility] EMR system, [employer] [facility]   Hernia of abdominal cavity    History of fall    Per [facility] EMR system, [employer] [facility]   Hyperlipemia    Per [facility] EMR system, [employer] [facility]   Kyphosis    Polyneuropathy    Per [facility] EMR system, [employer] [facility]   RA (rheumatoid arthritis) (HCC)    Per [facility] EMR system, [facility]    Past Surgical History:  Procedure Laterality Date   ABDOMINAL HYSTERECTOMY     CATARACT EXTRACTION,  BILATERAL     REPLACEMENT TOTAL HIP W/  RESURFACING IMPLANTS     TONSILLECTOMY      Current Medications: Current Outpatient Medications on File Prior to Visit  Medication Sig Dispense Refill   acetaminophen (TYLENOL) 500 MG tablet Take 500 mg by mouth 3 (three) times daily as needed (for R.A. pain).     atorvastatin (LIPITOR) 10 MG tablet TAKE 1 TABLET DAILY (Patient taking differently: Take 10 mg by mouth at bedtime.) 90 tablet 3   Cholecalciferol (VITAMIN D3) 1000 units CAPS Take 1,000 Units by mouth daily.     cyanocobalamin 1000 MCG tablet Take 1,000 mcg by mouth daily.     feeding supplement (ENSURE ENLIVE / ENSURE PLUS) LIQD Take 237 mLs by mouth 2 (two) times daily between meals. 237 mL 12   fexofenadine (ALLEGRA) 60 MG tablet TAKE 1 TABLET DAILY AS NEEDED FOR ALLERGIES OR RHINITIS 90 tablet 1   gabapentin (NEURONTIN) 400 MG capsule TAKE 1 CAPSULE DAILY (Patient taking differently: Take 400 mg by mouth at bedtime.) 90 capsule 3   meclizine (ANTIVERT) 12.5 MG tablet Take 1 tablet (12.5 mg total) by mouth 3 (three) times daily as needed for dizziness. 30 tablet 0   metoprolol tartrate (LOPRESSOR) 50 MG tablet TAKE 1 TABLET TWICE A DAY 180 tablet 1   Multiple Vitamin (MULTIVITAMIN)  capsule Take 1 capsule by mouth daily with breakfast.     omeprazole (PRILOSEC) 20 MG capsule TAKE 1 CAPSULE TWICE A DAY BEFORE MEALS 180 capsule 3   predniSONE (DELTASONE) 5 MG tablet Take 5 mg by mouth daily with breakfast.     sertraline (ZOLOFT) 100 MG tablet TAKE 1 TABLET DAILY 90 tablet 3   torsemide (DEMADEX) 20 MG tablet Take 1 tablet (20 mg total) by mouth daily. 30 tablet 0   traMADol (ULTRAM) 50 MG tablet Take 2 tablets (100 mg) in a.m., 1 tablet (50 mg) at lunch, and 1 tablet (50 mg) in evening as needed for moderate to severe pain from arthritis and hip fracture. (Patient taking differently: Take 50-100 mg by mouth See admin instructions. Take 100 mg by mouth in the morning and 50 mg in the afternoon  & at bedtime (scheduled)) 120 tablet 4   warfarin (COUMADIN) 5 MG tablet TAKE 1 TABLET BY MOUTH DAILY EXCEPT TAKE 1/2 TABLET ON MONDAY, WEDNESDAY AND FRIDAY OR AS DIRECTED BY ANTICOAGULATION CLINIC. 95 tablet 1   No current facility-administered medications on file prior to visit.    Allergies:   Sulfa antibiotics, Tape, Codeine, Hydroxychloroquine, Shellfish-derived products, Latex, Neosporin original [bacitracin-neomycin-polymyxin], and Polysporin [bacitracin-polymyxin b]   Social History   Socioeconomic History   Marital status: Widowed    Spouse name: Not on file   Number of children: Not on file   Years of education: Not on file   Highest education level: Not on file  Occupational History   Not on file  Tobacco Use   Smoking status: Former   Smokeless tobacco: Never  Vaping Use   Vaping status: Never Used  Substance and Sexual Activity   Alcohol use: No   Drug use: No   Sexual activity: Not on file  Other Topics Concern   Not on file  Social History Narrative   Not on file   Social Drivers of Health   Financial Resource Strain: Low Risk  (08/31/2021)   Overall Financial Resource Strain (CARDIA)    Difficulty of Paying Living Expenses: Not hard at all  Food Insecurity: No Food Insecurity (05/02/2023)   Hunger Vital Sign    Worried About Running Out of Food in the Last Year: Never true    Ran Out of Food in the Last Year: Never true  Transportation Needs: No Transportation Needs (05/02/2023)   PRAPARE - Administrator, Civil Service (Medical): No    Lack of Transportation (Non-Medical): No  Physical Activity: Inactive (08/31/2021)   Exercise Vital Sign    Days of Exercise per Week: 0 days    Minutes of Exercise per Session: 0 min  Stress: No Stress Concern Present (08/31/2021)   [employer] of Occupational Health - Occupational Stress Questionnaire    Feeling of Stress : Not at all  Social Connections: Socially Isolated (08/31/2021)   Social  Connection and Isolation Panel [NHANES]    Frequency of Communication with Friends and Family: More than three times a week    Frequency of Social Gatherings with Friends and Family: More than three times a week    Attends Religious Services: Never    Database administrator or Organizations: No    Attends Banker Meetings: Never    Marital Status: Widowed     Family History: The patient's family history includes Healthy in her mother; Heart failure in her father.  ROS:   Please see the history of present illness.  All other systems reviewed and are negative.  EKGs/Labs/Other Studies Reviewed:    The following studies were reviewed today: ECHOCARDIOGRAM 2019: Study Conclusions   - Left ventricle: The cavity size was normal. Systolic function was    normal. The estimated ejection fraction was in the range of 60%    to 65%. Wall motion was normal; there were no regional wall    motion abnormalities. The study is not technically sufficient to    allow evaluation of LV diastolic function.  - Aortic valve: Sclerosis without stenosis.  - Mitral valve: Calcified annulus. Moderately thickened, mildly    calcified leaflets . There was mild regurgitation.  - Left atrium: The atrium was moderately dilated.  - Right ventricle: The cavity size was mildly dilated. Wall    thickness was normal.  - Right atrium: The atrium was moderately dilated.  - Atrial septum: No defect or patent foramen ovale was identified.  - Tricuspid valve: There was moderate regurgitation.   EKG:  EKG is  ordered today.  The ekg ordered today demonstrates   01/27/2023- afib, rate 89  Recent Labs: 03/13/2023: ALT 24 04/23/2023: TSH 1.214 04/24/2023: Magnesium 1.9 04/26/2023: B Natriuretic Peptide 154.7 04/30/2023: Hemoglobin 10.8; Platelets 216 05/13/2023: BUN 17; Creatinine, Ser 0.66; Potassium 3.9; Sodium 138  Recent Lipid Panel No results found for: "CHOL", "TRIG", "HDL", "CHOLHDL", "VLDL",  "LDLCALC", "LDLDIRECT"   Risk Assessment/Calculations:    CHA2DS2-VASc Score = 6   This indicates a 9.7% annual risk of stroke. The patient's score is based upon: CHF History: 1 HTN History: 1 Diabetes History: 0 Stroke History: 0 Vascular Disease History: 1 Age Score: 2 Gender Score: 1           Physical Exam:    VS:   Vitals:   07/29/23 1025  BP: 98/60  Pulse: 64  SpO2: 98%     Wt Readings from Last 3 Encounters:  05/13/23 83 lb 12.8 oz (38 kg)  04/30/23 89 lb 11.6 oz (40.7 kg)  04/07/23 89 lb (40.4 kg)     GEN: sitting in a wheelchair, contracted/kyphoscoliosis Well nourished, well developed in no acute distress HEENT: Normal NECK: No JVD; No carotid bruits CARDIAC: IRRR, no murmurs, RESPIRATORY:  Clear to auscultation without rales, wheezing or rhonchi  ABDOMEN: Soft, non-tender, non-distended MUSCULOSKELETAL:  No LE edema SKIN: Warm and dry NEUROLOGIC:  Alert and oriented x 3 PSYCHIATRIC:  Normal affect   ASSESSMENT:    Chronic Afib -asymptomatic. rate controlled -coumadin (GI bleed on xarelto); INR goal 2-3  HFpEF - euvolemic - continue torsemide 20 mg daily   Severe BL subclavian stenosis: chronic and stable.   PLAN:    In order of problems listed above:  No changes Follow up 6 months           Medication Adjustments/Labs and Tests Ordered: Current medicines are reviewed at length with the patient today.  Concerns regarding medicines are outlined above.  No orders of the defined types were placed in this encounter.  No orders of the defined types were placed in this encounter.   There are no Patient Instructions on file for this visit.   Signed, Maisie Fus, MD  07/29/2023 10:17 AM    [facility]

## 2023-07-29 NOTE — Patient Instructions (Signed)
Medication Instructions:  No changes  *If you need a refill on your cardiac medications before your next appointment, please call your pharmacy*   Lab Work: None  If you have labs (blood work) drawn today and your tests are completely normal, you will receive your results only by: MyChart Message (if you have MyChart) OR A paper copy in the mail If you have any lab test that is abnormal or we need to change your treatment, we will call you to review the results.   Testing/Procedures: None needed   Follow-Up: At [facility], you and your health needs are our priority.  As part of our continuing mission to provide you with exceptional heart care, we have created designated Provider Care Teams.  These Care Teams include your primary Cardiologist (physician) and Advanced Practice Providers (APPs -  Physician Assistants and Nurse Practitioners) who all work together to provide you with the care you need, when you need it.   Your next appointment:   6 month(s)  Provider:   Maisie Fus, MD

## 2023-07-30 ENCOUNTER — Other Ambulatory Visit: Payer: Self-pay | Admitting: Family Medicine

## 2023-07-30 DIAGNOSIS — G629 Polyneuropathy, unspecified: Secondary | ICD-10-CM

## 2023-08-01 ENCOUNTER — Ambulatory Visit (INDEPENDENT_AMBULATORY_CARE_PROVIDER_SITE_OTHER): Payer: Self-pay

## 2023-08-01 DIAGNOSIS — Z7901 Long term (current) use of anticoagulants: Secondary | ICD-10-CM

## 2023-08-01 LAB — POCT INR: INR: 2 (ref 2.0–3.0)

## 2023-08-01 NOTE — Progress Notes (Signed)
Patient's daughter, [name], helps test INR with home [employer] analyzer and submitted results via portal.  Continue 1 daily except take 1/2 tablet on Monday, Wednesday and Friday. Recheck in 3 week, on 08/21/22. LVM for pt's daughter, [name], and advised of dosing and recheck. Contacted pt and advised. Pt verbalized understanding.

## 2023-08-01 NOTE — Patient Instructions (Addendum)
Pre visit review using our clinic review tool, if applicable. No additional management support is needed unless otherwise documented below in the visit note.  Patient's daughter, [name], helps test INR with home [employer] analyzer and submitted results via portal.  Continue 1 daily except take 1/2 tablet on Monday, Wednesday and Friday. Recheck in 3 week, on 08/21/22. LVM for pt's daughter, [name], and advised of dosing and recheck. Contacted pt and advised. Pt verbalized understanding.

## 2023-08-21 ENCOUNTER — Ambulatory Visit (INDEPENDENT_AMBULATORY_CARE_PROVIDER_SITE_OTHER): Payer: Self-pay

## 2023-08-21 DIAGNOSIS — Z7901 Long term (current) use of anticoagulants: Secondary | ICD-10-CM | POA: Diagnosis not present

## 2023-08-21 LAB — POCT INR: INR: 2 (ref 2.0–3.0)

## 2023-08-21 NOTE — Patient Instructions (Addendum)
 Pre visit review using our clinic review tool, if applicable. No additional management support is needed unless otherwise documented below in the visit note.  Continue 1 daily except take 1/2 tablet on Monday, Wednesday and Friday. Recheck in 3 week, on 09/11/23.

## 2023-08-21 NOTE — Progress Notes (Signed)
 Patient's daughter, Romero, helps test INR with home [employer] analyzer and submitted results via portal.  Continue 1 daily except take 1/2 tablet on Monday, Wednesday and Friday. Recheck in 3 week, on 09/11/23. LVM for pt's daughter, Romero, and advised of dosing and recheck. LVM for pt also.

## 2023-08-25 DIAGNOSIS — H26492 Other secondary cataract, left eye: Secondary | ICD-10-CM | POA: Diagnosis not present

## 2023-08-25 DIAGNOSIS — H353132 Nonexudative age-related macular degeneration, bilateral, intermediate dry stage: Secondary | ICD-10-CM | POA: Diagnosis not present

## 2023-08-25 DIAGNOSIS — Z9841 Cataract extraction status, right eye: Secondary | ICD-10-CM | POA: Diagnosis not present

## 2023-08-25 DIAGNOSIS — Z9842 Cataract extraction status, left eye: Secondary | ICD-10-CM | POA: Diagnosis not present

## 2023-08-25 DIAGNOSIS — Z961 Presence of intraocular lens: Secondary | ICD-10-CM | POA: Diagnosis not present

## 2023-08-25 DIAGNOSIS — H52223 Regular astigmatism, bilateral: Secondary | ICD-10-CM | POA: Diagnosis not present

## 2023-08-28 ENCOUNTER — Encounter: Payer: Self-pay | Admitting: Family Medicine

## 2023-08-29 ENCOUNTER — Other Ambulatory Visit: Payer: Self-pay | Admitting: Family Medicine

## 2023-08-29 DIAGNOSIS — M150 Primary generalized (osteo)arthritis: Secondary | ICD-10-CM

## 2023-08-29 DIAGNOSIS — M069 Rheumatoid arthritis, unspecified: Secondary | ICD-10-CM

## 2023-08-29 MED ORDER — TRAMADOL HCL 50 MG PO TABS: 50.0000 mg | ORAL_TABLET | ORAL | 4 refills | Status: DC

## 2023-09-01 ENCOUNTER — Ambulatory Visit (INDEPENDENT_AMBULATORY_CARE_PROVIDER_SITE_OTHER): Payer: Medicare Other | Admitting: Podiatry

## 2023-09-01 DIAGNOSIS — I739 Peripheral vascular disease, unspecified: Secondary | ICD-10-CM | POA: Diagnosis not present

## 2023-09-01 DIAGNOSIS — M79674 Pain in right toe(s): Secondary | ICD-10-CM | POA: Diagnosis not present

## 2023-09-01 DIAGNOSIS — B351 Tinea unguium: Secondary | ICD-10-CM | POA: Diagnosis not present

## 2023-09-01 DIAGNOSIS — M79675 Pain in left toe(s): Secondary | ICD-10-CM

## 2023-09-01 NOTE — Progress Notes (Signed)
  Subjective:  Patient ID: Stacey [name], female    DOB: [birth year],   MRN: [id number]  No chief complaint on file.   89 y.o. female presents for concern of thickened elongated and painful nails that are difficult to trim. Requesting to have them trimmed today. Relates burning and tingling in their feet. Patient has a history of PAD and on chronic anticoagulation.        PCP:  [name], MD    . Denies any other pedal complaints. Denies n/v/f/c.   Past Medical History:  Diagnosis Date   Age-related osteoporosis without current pathological fracture    Per [facility] EMR system, [facility]   Atrial fibrillation (HCC)    Chronic diastolic heart failure (HCC)    Per [facility] EMR system, [facility]   Diaphragmatic hernia without obstruction or gangrene    Per [facility] EMR system, [employer] [facility]   Fracture of left superior rim of pubis with routine healing    Per [facility] EMR system, [employer] [facility]   Gastroesophageal reflux disease    Per [facility] EMR system, [employer] [facility]   Hernia of abdominal cavity    History of fall    Per [facility] EMR system, [employer] [facility]   Hyperlipemia    Per [facility] EMR system, [employer] [facility]   Kyphosis    Polyneuropathy    Per [facility] EMR system, [employer] [facility]   RA (rheumatoid arthritis) (HCC)    Per [facility] EMR system, [facility]    Objective:  Physical Exam: Vascular: DP/PT pulses 2/4 bilateral. CFT <3 seconds. Normal hair growth on digits. No edema.  Skin. No lacerations or abrasions bilateral feet.Right heel ulcer healed   .Mild erythema surrounding, improved.  Right second digit -healed. Hyperkeratosis dista left hallux. No purulence noted. Nails 1-5 on the right and hallux nail on left are thickened and elongated with subungal debris.  Musculoskeletal: MMT 5/5 bilateral lower extremities in DF, PF, Inversion and Eversion. Deceased ROM in DF of ankle joint. Tender to wound area.   Neurological: Sensation intact to light touch.   Assessment:   1. Pain due to onychomycosis of toenails of both feet   2. PAD (peripheral artery disease) (HCC)       Plan:  Patient was evaluated and treated and all questions answered. -Mechanically debrided all nails 1-5 bilateral using sterile nail nipper and filed with dremel without incident  -No open wounds today.  -Small pre-ulcerative area on dorsum of right second digit and distal left hallux covered with neosporin and bandaid.  -Answered all patient questions -Patient to return  in 3 months for at risk foot care -Patient advised to call the office if any problems or questions arise in the meantime.   Louann Sjogren, DPM

## 2023-09-04 ENCOUNTER — Other Ambulatory Visit: Payer: Self-pay | Admitting: Family Medicine

## 2023-09-12 DIAGNOSIS — H353111 Nonexudative age-related macular degeneration, right eye, early dry stage: Secondary | ICD-10-CM | POA: Diagnosis not present

## 2023-09-12 DIAGNOSIS — Z961 Presence of intraocular lens: Secondary | ICD-10-CM | POA: Diagnosis not present

## 2023-09-12 DIAGNOSIS — H26492 Other secondary cataract, left eye: Secondary | ICD-10-CM | POA: Diagnosis not present

## 2023-09-17 ENCOUNTER — Telehealth: Payer: Self-pay

## 2023-09-17 NOTE — Telephone Encounter (Signed)
 Pt's daughter tests pt's INR at home. Pt was due for check last week. LVM to test.

## 2023-09-18 ENCOUNTER — Ambulatory Visit (INDEPENDENT_AMBULATORY_CARE_PROVIDER_SITE_OTHER): Payer: Medicare Other

## 2023-09-18 DIAGNOSIS — Z7901 Long term (current) use of anticoagulants: Secondary | ICD-10-CM

## 2023-09-18 LAB — POCT INR: INR: 2.3 (ref 2.0–3.0)

## 2023-09-18 NOTE — Progress Notes (Signed)
 Patient's daughter, Romero, helps test INR with home [employer] analyzer and submitted results via portal.  Continue 1 daily except take 1/2 tablet on Monday, Wednesday and Friday. Recheck in 3 week, on 10/09/23. Contacted pt's daughter, Romero, and pt and advised of dosing and recheck.

## 2023-09-18 NOTE — Patient Instructions (Addendum)
 Pre visit review using our clinic review tool, if applicable. No additional management support is needed unless otherwise documented below in the visit note.  Continue 1 daily except take 1/2 tablet on Monday, Wednesday and Friday. Recheck in 3 week, on 10/09/23.

## 2023-09-25 ENCOUNTER — Other Ambulatory Visit: Payer: Self-pay | Admitting: Family Medicine

## 2023-09-25 ENCOUNTER — Other Ambulatory Visit: Payer: Self-pay | Admitting: Adult Health

## 2023-09-25 ENCOUNTER — Ambulatory Visit: Payer: Medicare Other

## 2023-09-25 ENCOUNTER — Ambulatory Visit: Payer: Medicare Other | Admitting: Adult Health

## 2023-09-25 VITALS — BP 96/60 | HR 71 | Temp 97.7°F

## 2023-09-25 DIAGNOSIS — J40 Bronchitis, not specified as acute or chronic: Secondary | ICD-10-CM

## 2023-09-25 DIAGNOSIS — R0602 Shortness of breath: Secondary | ICD-10-CM | POA: Diagnosis not present

## 2023-09-25 DIAGNOSIS — R918 Other nonspecific abnormal finding of lung field: Secondary | ICD-10-CM | POA: Diagnosis not present

## 2023-09-25 DIAGNOSIS — J302 Other seasonal allergic rhinitis: Secondary | ICD-10-CM

## 2023-09-25 DIAGNOSIS — J9811 Atelectasis: Secondary | ICD-10-CM | POA: Diagnosis not present

## 2023-09-25 DIAGNOSIS — J42 Unspecified chronic bronchitis: Secondary | ICD-10-CM | POA: Diagnosis not present

## 2023-09-25 MED ORDER — PREDNISONE 10 MG PO TABS: ORAL_TABLET | ORAL | 0 refills | Status: DC

## 2023-09-25 MED ORDER — AMOXICILLIN 500 MG PO CAPS: 500.0000 mg | ORAL_CAPSULE | Freq: Two times a day (BID) | ORAL | 0 refills | Status: AC

## 2023-09-25 NOTE — Progress Notes (Signed)
Subjective:    Patient ID: Stacey [name], female    DOB: 1933/03/13, 89 y.o.   MRN: [id number]  HPI 89 year old female who  has a past medical history of Age-related osteoporosis without current pathological fracture, Atrial fibrillation (HCC), Chronic diastolic heart failure (HCC), Diaphragmatic hernia without obstruction or gangrene, Fracture of left superior rim of pubis with routine healing, Gastroesophageal reflux disease, Hernia of abdominal cavity, History of fall, Hyperlipemia, Kyphosis, Polyneuropathy, and RA (rheumatoid arthritis) (HCC).  She presents to the office today for an acute issue. She reports that about a week ago she developed a fever up to 100.1 ( lasted a few hours)  and did not feel well. She then developed a productive cough, shortness of breath and wheezing that is worse at night. At home she has been using Robatussin with mild relief.      Review of Systems See HPI   Past Medical History:  Diagnosis Date   Age-related osteoporosis without current pathological fracture    Per [facility], [facility]   Atrial fibrillation (HCC)    Chronic diastolic heart failure (HCC)    Per [facility], [facility]   Diaphragmatic hernia without obstruction or gangrene    Per [facility]MR system, [facility]   Fracture of left superior rim of pubis with routine healing    Per [facility]MR system, [facility]   Gastroesophageal reflux disease    Per [facility]MR system, [facility]   Hernia of abdominal cavity    History of fall    Per [facility]MR system, [facility]   Hyperlipemia    Per [facility]MR system, [facility]   Kyphosis    Polyneuropathy    Per [facility]MR system, [facility]   RA (rheumatoid arthritis) (HCC)    Per [facility]MR system, [facility]    Social History   Socioeconomic History   Marital status: Widowed    Spouse name: Not on file   Number of children:  Not on file   Years of education: Not on file   Highest education level: Not on file  Occupational History   Not on file  Tobacco Use   Smoking status: Former   Smokeless tobacco: Never  Vaping Use   Vaping status: Never Used  Substance and Sexual Activity   Alcohol use: No   Drug use: No   Sexual activity: Not on file  Other Topics Concern   Not on file  Social History Narrative   Not on file   Social Drivers of Health   Financial Resource Strain: Low Risk  (08/31/2021)   Overall Financial Resource Strain (CARDIA)    Difficulty of Paying Living Expenses: Not hard at all  Food Insecurity: No Food Insecurity (05/02/2023)   Hunger Vital Sign    Worried About Running Out of Food in the Last Year: Never true    Ran Out of Food in the Last Year: Never true  Transportation Needs: No Transportation Needs (05/02/2023)   PRAPARE - Administrator, Civil Service (Medical): No    Lack of Transportation (Non-Medical): No  Physical Activity: Inactive (08/31/2021)   Exercise Vital Sign    Days of Exercise per Week: 0 days    Minutes of Exercise per Session: 0 min  Stress: No Stress Concern Present (08/31/2021)   [employer] of Occupational Health -  Occupational Stress Questionnaire    Feeling of Stress : Not at all  Social Connections: Socially Isolated (08/31/2021)   Social Connection and Isolation Panel [NHANES]    Frequency of Communication with Friends and Family: More than three times a week    Frequency of Social Gatherings with Friends and Family: More than three times a week    Attends Religious Services: Never    Database administrator or Organizations: No    Attends Banker Meetings: Never    Marital Status: Widowed  Intimate Partner Violence: Not At Risk (04/23/2023)   Humiliation, Afraid, Rape, and Kick questionnaire    Fear of Current or Ex-Partner: No    Emotionally Abused: No    Physically Abused: No    Sexually Abused: No    Past Surgical  History:  Procedure Laterality Date   ABDOMINAL HYSTERECTOMY     CATARACT EXTRACTION, BILATERAL     REPLACEMENT TOTAL HIP W/  RESURFACING IMPLANTS     TONSILLECTOMY      Family History  Problem Relation Age of Onset   Healthy Mother    Heart failure Father     Allergies  Allergen Reactions   Sulfa Antibiotics Anaphylaxis   Tape Other (See Comments)    BRUISES VERY EASILY!!!!   Codeine Nausea And Vomiting   Hydroxychloroquine Other (See Comments)    GI upset   Shellfish-Derived Products Other (See Comments)    Daughter was unsure of the reaction- "happened years ago" and patient avoids foods that contain shellfish   Latex Rash   Neosporin Original [Bacitracin-Neomycin-Polymyxin] Rash   Polysporin [Bacitracin-Polymyxin B] Rash    Current Outpatient Medications on File Prior to Visit  Medication Sig Dispense Refill   acetaminophen (TYLENOL) 500 MG tablet Take 500 mg by mouth 3 (three) times daily as needed (for R.A. pain).     atorvastatin (LIPITOR) 10 MG tablet TAKE 1 TABLET DAILY (Patient taking differently: Take 10 mg by mouth at bedtime.) 90 tablet 3   Cholecalciferol (VITAMIN D3) 1000 units CAPS Take 1,000 Units by mouth daily.     cyanocobalamin 1000 MCG tablet Take 1,000 mcg by mouth daily.     feeding supplement (ENSURE ENLIVE / ENSURE PLUS) LIQD Take 237 mLs by mouth 2 (two) times daily between meals. 237 mL 12   fexofenadine (ALLEGRA) 60 MG tablet TAKE 1 TABLET DAILY AS NEEDED FOR ALLERGIES OR RHINITIS 90 tablet 1   gabapentin (NEURONTIN) 400 MG capsule TAKE 1 CAPSULE DAILY 90 capsule 3   meclizine (ANTIVERT) 12.5 MG tablet Take 1 tablet (12.5 mg total) by mouth 3 (three) times daily as needed for dizziness. 30 tablet 0   metoprolol tartrate (LOPRESSOR) 50 MG tablet TAKE 1 TABLET TWICE A DAY 180 tablet 1   Multiple Vitamin (MULTIVITAMIN) capsule Take 1 capsule by mouth daily with breakfast.     omeprazole (PRILOSEC) 20 MG capsule TAKE 1 CAPSULE TWICE A DAY BEFORE MEALS  180 capsule 3   predniSONE (DELTASONE) 5 MG tablet TAKE 1 TABLET DAILY 90 tablet 3   sertraline (ZOLOFT) 100 MG tablet TAKE 1 TABLET DAILY 90 tablet 3   torsemide (DEMADEX) 20 MG tablet Take 1 tablet (20 mg total) by mouth daily. 30 tablet 0   traMADol (ULTRAM) 50 MG tablet Take 1-2 tablets (50-100 mg total) by mouth See admin instructions. Take 100 mg by mouth in the morning and 50 mg in the afternoon & at bedtime (scheduled) 120 tablet 4   warfarin (COUMADIN) 5  MG tablet TAKE 1 TABLET BY MOUTH DAILY EXCEPT TAKE 1/2 TABLET ON MONDAY, WEDNESDAY AND FRIDAY OR AS DIRECTED BY ANTICOAGULATION CLINIC. 95 tablet 1   No current facility-administered medications on file prior to visit.    BP 96/60   Pulse 71 Comment: Unable to obtain  Temp 97.7 F (36.5 C) (Oral)       Objective:   Physical Exam Vitals and nursing note reviewed.  Constitutional:      Appearance: Normal appearance.  Cardiovascular:     Rate and Rhythm: Normal rate and regular rhythm.     Pulses: Normal pulses.     Heart sounds: Normal heart sounds.  Pulmonary:     Effort: Pulmonary effort is normal.     Breath sounds: Examination of the right-upper field reveals wheezing. Examination of the left-upper field reveals wheezing. Examination of the right-middle field reveals wheezing. Examination of the left-middle field reveals wheezing. Examination of the right-lower field reveals wheezing. Examination of the left-lower field reveals wheezing. Wheezing present. No rhonchi.     Comments: Expiratory wheezing throughout lung fields.  Neurological:     General: No focal deficit present.     Mental Status: She is alert and oriented to person, place, and time.  Psychiatric:        Mood and Affect: Mood normal.        Behavior: Behavior normal.        Thought Content: Thought content normal.        Judgment: Judgment normal.        Assessment & Plan:  1. Bronchitis (Primary) - Will treat with higher dose prednisone, she can  stop her daily prednisone while she is taking this. Will cover for PNA with Amoxicillin.  - amoxicillin (AMOXIL) 500 MG capsule; Take 1 capsule (500 mg total) by mouth 2 (two) times daily for 5 days.  Dispense: 10 capsule; Refill: 0 - predniSONE (DELTASONE) 10 MG tablet; 40 mg x 3 days, 20 mg x 3 days, 10 mg x 3 days  Dispense: 21 tablet; Refill: 0 - DG Chest 2 View; Future  Shirline Frees, NP

## 2023-09-29 ENCOUNTER — Encounter: Payer: Self-pay | Admitting: Internal Medicine

## 2023-09-29 ENCOUNTER — Other Ambulatory Visit: Payer: Self-pay

## 2023-09-29 DIAGNOSIS — I509 Heart failure, unspecified: Secondary | ICD-10-CM

## 2023-09-29 DIAGNOSIS — I482 Chronic atrial fibrillation, unspecified: Secondary | ICD-10-CM

## 2023-09-29 MED ORDER — METOPROLOL TARTRATE 50 MG PO TABS: 50.0000 mg | ORAL_TABLET | Freq: Two times a day (BID) | ORAL | 1 refills | Status: DC

## 2023-10-06 ENCOUNTER — Telehealth: Payer: Self-pay

## 2023-10-06 DIAGNOSIS — Z7901 Long term (current) use of anticoagulants: Secondary | ICD-10-CM | POA: Diagnosis not present

## 2023-10-06 DIAGNOSIS — I4821 Permanent atrial fibrillation: Secondary | ICD-10-CM | POA: Diagnosis not present

## 2023-10-06 NOTE — Telephone Encounter (Signed)
 called the reading room

## 2023-10-06 NOTE — Telephone Encounter (Signed)
 Copied from CRM [id number]. Topic: Clinical - Lab/Test Results >> Oct 06, 2023  3:55 PM Corin V wrote: Reason for CRM: Patient called to find out what her chest Xray results 09/25/23. Please attempt to obtain results and call patient back at [phone]

## 2023-10-07 ENCOUNTER — Ambulatory Visit (INDEPENDENT_AMBULATORY_CARE_PROVIDER_SITE_OTHER): Payer: Self-pay

## 2023-10-07 DIAGNOSIS — Z7901 Long term (current) use of anticoagulants: Secondary | ICD-10-CM | POA: Diagnosis not present

## 2023-10-07 LAB — POCT INR: INR: 2.4 (ref 2.0–3.0)

## 2023-10-07 NOTE — Progress Notes (Signed)
 Patient's daughter, [name], helps test INR with home [employer] analyzer and submitted results via portal.  Pt was diagnosed with bronchitis/pneumonia on 2/13 and was prescribed Amoxicilllin (no interaction) and prednisone taper (potential interaction). Pt has now finished these medications.  Continue 1 daily except take 1/2 tablet on Monday, Wednesday and Friday. Recheck in 3 week, on 10/28/23. LVM for pt's daughter, [name], and contacted pt and advised of dosing and recheck. Pt verbalized understanding.

## 2023-10-07 NOTE — Patient Instructions (Addendum)
 Pre visit review using our clinic review tool, if applicable. No additional management support is needed unless otherwise documented below in the visit note.  Continue 1 daily except take 1/2 tablet on Monday, Wednesday and Friday. Recheck in 3 week, on 10/28/23.

## 2023-10-07 NOTE — Telephone Encounter (Signed)
 Contacted pt for warfarin dosing instructions after receiving home INR result. Gave pt results of x-ray and message from provider under the imaging tab. Pt verbalized understanding. Could not document in the results.

## 2023-10-29 ENCOUNTER — Telehealth: Payer: Self-pay

## 2023-10-29 NOTE — Telephone Encounter (Signed)
 Pt's daughter tests pt's INR at home. Pt is due to test. LVM requesting testing.

## 2023-10-30 ENCOUNTER — Encounter: Payer: Self-pay | Admitting: Family Medicine

## 2023-10-30 ENCOUNTER — Ambulatory Visit (INDEPENDENT_AMBULATORY_CARE_PROVIDER_SITE_OTHER): Payer: Self-pay

## 2023-10-30 DIAGNOSIS — Z7901 Long term (current) use of anticoagulants: Secondary | ICD-10-CM

## 2023-10-30 LAB — POCT INR: INR: 2.5 (ref 2.0–3.0)

## 2023-10-30 NOTE — Patient Instructions (Addendum)
 Pre visit review using our clinic review tool, if applicable. No additional management support is needed unless otherwise documented below in the visit note.  Continue 1 daily except take 1/2 tablet on Monday, Wednesday and Friday. Recheck in 3 week, on 11/19/23.

## 2023-10-30 NOTE — Progress Notes (Addendum)
 Patient's daughter, [name], helps test INR with home [employer] analyzer and submitted results via portal.  Continue 1 daily except take 1/2 tablet on Monday, Wednesday and Friday. Recheck in 3 week, on 11/19/23. LVM for pt's daughter, [name], and pt and advised of dosing and recheck date.

## 2023-11-09 ENCOUNTER — Ambulatory Visit (HOSPITAL_COMMUNITY): Admission: EM | Admit: 2023-11-09 | Discharge: 2023-11-09 | Disposition: A | Discharge status: Home / Self Care

## 2023-11-09 ENCOUNTER — Encounter (HOSPITAL_COMMUNITY): Payer: Self-pay

## 2023-11-09 DIAGNOSIS — S51812A Laceration without foreign body of left forearm, initial encounter: Secondary | ICD-10-CM | POA: Diagnosis not present

## 2023-11-09 DIAGNOSIS — H6123 Impacted cerumen, bilateral: Secondary | ICD-10-CM | POA: Diagnosis not present

## 2023-11-09 NOTE — Discharge Instructions (Addendum)
 1. Skin tear of forearm without complication, left, initial encounter (Primary) - Wound care (Clean Wound) completed prior to redressing wound. - Apply dressing performed in UC with antibiotic ointment, nonadherent bandage and Coban for protection and compression of wound.  2. Bilateral impacted cerumen - Ear wax removal performed in UC, no sign of secondary infection, scant cerumen removed by irrigation.

## 2023-11-09 NOTE — ED Provider Notes (Addendum)
 [facility] [place]  Note:  This document was prepared using Dragon voice recognition software and may include unintentional dictation errors.  MRN: [id number] DOB: 10-Oct-1932  Subjective:   Stacey [name] is a 89 y.o. female presenting for a skin tear to the left forearm after a fall that occurred yesterday.  Patient reports that she was in the bathroom when she scraped her left forearm against the railing in the bathroom causing skin tear.  Patient reports skin hanging from the wound, moderate bleeding, bandage placed over site at home.  Patient brought here by granddaughter who was concerned that it might need advanced treatment.  Patient denies any severe pain.  No pain medication taken for skin tear.  Patient also reports mild intermittent loss of hearing out of right ear, believes that ear is clogged with cerumen.  Patient reports past history of cerumen impactions and would like ears inspected to make sure they do not need earwax removal.  No current facility-administered medications for this encounter.  Current Outpatient Medications:    acetaminophen (TYLENOL) 500 MG tablet, Take 500 mg by mouth 3 (three) times daily as needed (for R.A. pain)., Disp: , Rfl:    atorvastatin (LIPITOR) 10 MG tablet, TAKE 1 TABLET DAILY (Patient taking differently: Take 10 mg by mouth at bedtime.), Disp: 90 tablet, Rfl: 3   Cholecalciferol (VITAMIN D3) 1000 units CAPS, Take 1,000 Units by mouth daily., Disp: , Rfl:    cyanocobalamin 1000 MCG tablet, Take 1,000 mcg by mouth daily., Disp: , Rfl:    feeding supplement (ENSURE ENLIVE / ENSURE PLUS) LIQD, Take 237 mLs by mouth 2 (two) times daily between meals., Disp: 237 mL, Rfl: 12   fexofenadine (ALLEGRA) 60 MG tablet, TAKE 1 TABLET DAILY AS NEEDED FOR ALLERGIES OR RHINITIS, Disp: 90 tablet, Rfl: 3   gabapentin (NEURONTIN) 400 MG capsule, TAKE 1 CAPSULE DAILY, Disp: 90 capsule, Rfl: 3   meclizine (ANTIVERT) 12.5 MG tablet, Take 1 tablet (12.5 mg  total) by mouth 3 (three) times daily as needed for dizziness., Disp: 30 tablet, Rfl: 0   metoprolol tartrate (LOPRESSOR) 50 MG tablet, Take 1 tablet (50 mg total) by mouth 2 (two) times daily., Disp: 180 tablet, Rfl: 1   Multiple Vitamin (MULTIVITAMIN) capsule, Take 1 capsule by mouth daily with breakfast., Disp: , Rfl:    omeprazole (PRILOSEC) 20 MG capsule, TAKE 1 CAPSULE TWICE A DAY BEFORE MEALS, Disp: 180 capsule, Rfl: 3   predniSONE (DELTASONE) 5 MG tablet, TAKE 1 TABLET DAILY, Disp: 90 tablet, Rfl: 3   sertraline (ZOLOFT) 100 MG tablet, TAKE 1 TABLET DAILY, Disp: 90 tablet, Rfl: 3   torsemide (DEMADEX) 20 MG tablet, Take 1 tablet (20 mg total) by mouth daily., Disp: 30 tablet, Rfl: 0   traMADol (ULTRAM) 50 MG tablet, Take 1-2 tablets (50-100 mg total) by mouth See admin instructions. Take 100 mg by mouth in the morning and 50 mg in the afternoon & at bedtime (scheduled), Disp: 120 tablet, Rfl: 4   warfarin (COUMADIN) 5 MG tablet, TAKE 1 TABLET BY MOUTH DAILY EXCEPT TAKE 1/2 TABLET ON MONDAY, WEDNESDAY AND FRIDAY OR AS DIRECTED BY ANTICOAGULATION CLINIC., Disp: 95 tablet, Rfl: 1   Allergies  Allergen Reactions   Sulfa Antibiotics Anaphylaxis   Tape Other (See Comments)    BRUISES VERY EASILY!!!!   Codeine Nausea And Vomiting   Hydroxychloroquine Other (See Comments)    GI upset   Shellfish-Derived Products Other (See Comments)    Daughter was unsure of the  reaction- "happened years ago" and patient avoids foods that contain shellfish   Latex Rash   Neosporin Original [Bacitracin-Neomycin-Polymyxin] Rash   Polysporin [Bacitracin-Polymyxin B] Rash    Past Medical History:  Diagnosis Date   Age-related osteoporosis without current pathological fracture    Per [facility], [facility]   Atrial fibrillation (HCC)    Chronic diastolic heart failure (HCC)    Per [facility] system, [facility]   Diaphragmatic hernia without obstruction or gangrene    Per [facility] EMR system, [facility]   Fracture of left superior rim of pubis with routine healing    Per [facility] system, [facility]   Gastroesophageal reflux disease    Per [facility] system, [facility]   Hernia of abdominal cavity    History of fall    Per [facility]MR system, [facility]   Hyperlipemia    Per [facility] system, [facility]   Kyphosis    Polyneuropathy    Per [facility] system, [facility]   RA (rheumatoid arthritis) (HCC)    Per [facility] system, [facility]     Past Surgical History:  Procedure Laterality Date   ABDOMINAL HYSTERECTOMY     CATARACT EXTRACTION, BILATERAL     REPLACEMENT TOTAL HIP W/  RESURFACING IMPLANTS     TONSILLECTOMY      Family History  Problem Relation Age of Onset   Healthy Mother    Heart failure Father     Social History   Tobacco Use   Smoking status: Former   Smokeless tobacco: Never  Vaping Use   Vaping status: Never Used  Substance Use Topics   Alcohol use: No   Drug use: No    ROS Refer to HPI for ROS details.  Objective:   Vitals: BP (!) 87/53 (BP Location: Left Arm)   Pulse (!) 57   Temp 98.5 F (36.9 C) (Oral)   Resp 16   SpO2 91%   Physical Exam Vitals and nursing note reviewed.  Constitutional:      General: She is not in acute distress.    Appearance: Normal appearance. She is well-developed. She is not ill-appearing or toxic-appearing.  HENT:     Head: Normocephalic.     Right Ear: Ear canal and external ear normal. There is impacted cerumen.     Left Ear: Ear canal and external ear normal. There is impacted cerumen.  Cardiovascular:     Rate and Rhythm: Normal rate.  Pulmonary:     Effort: Pulmonary effort is normal. No respiratory distress.  Abdominal:     Palpations: Abdomen is soft.  Skin:    General: Skin is warm and dry.     Capillary Refill: Capillary refill takes less than 2 seconds.     Findings: Bruising, erythema and  wound (4 cm skin tear to left forearm secondary to fall yesterday.  Minimal bleeding, no drainage, moderate erythema surrounding wound, minimal swelling.) present. No acne.  Neurological:     General: No focal deficit present.     Mental Status: She is alert and oriented to person, place, and time.  Psychiatric:        Mood and Affect: Mood normal.   Bilateral ear canals post irrigation: Clear, no erythema, no bulging to TM, no perforation.  Procedures  No results found for this or any previous visit (from the  past 24 hours).  Assessment and Plan :   PDMP not reviewed this encounter.  1. Skin tear of forearm without complication, left, initial encounter   2. Bilateral impacted cerumen    1. Skin tear of forearm without complication, left, initial encounter (Primary) - Wound care (Clean Wound) completed prior to redressing wound. - Apply dressing performed in UC with antibiotic ointment, nonadherent bandage and Coban for protection and compression of wound.  2. Bilateral impacted cerumen - Ear wax removal performed in UC, no sign of secondary infection, scant cerumen removed by irrigation. -Continue to monitor symptoms for any change in severity if there is any escalation of current symptoms or development of new symptoms follow-up in ER for further evaluation and management.  [place]   [place], Bernice B, NP 11/09/23 1625    Pola Corn B, NP 11/09/23 1635

## 2023-11-09 NOTE — ED Triage Notes (Signed)
 Patient here today with c/o a laceration to left forearm after falling yesterday in the bathroom.

## 2023-11-13 ENCOUNTER — Encounter: Payer: Self-pay | Admitting: Family Medicine

## 2023-11-13 ENCOUNTER — Ambulatory Visit: Payer: Medicare Other | Admitting: Family Medicine

## 2023-11-13 VITALS — Wt 91.2 lb

## 2023-11-13 DIAGNOSIS — Z0000 Encounter for general adult medical examination without abnormal findings: Secondary | ICD-10-CM

## 2023-11-13 NOTE — Patient Instructions (Signed)
 I really enjoyed getting to talk with you today! I am available on Tuesdays and Thursdays for virtual visits if you have any questions or concerns, or if I can be of any further assistance.   CHECKLIST FROM ANNUAL WELLNESS VISIT:  -Follow up (please call to schedule if not scheduled after visit):   -yearly for annual wellness visit with primary care office  Here is a list of your preventive care/health maintenance measures and the plan for each if any are due:  PLAN For any measures below that may be due:  -can get vaccines at the pharmacy if you decide to get, please let us know if you do so that we can update your record.  Health Maintenance  Topic Date Due   COVID-19 Vaccine (1) Never done   DTaP/Tdap/Td (1 - Tdap) Never done   Zoster Vaccines- Shingrix (1 of 2) Never done   Pneumonia Vaccine 78+ Years old (2 of 2 - PCV) 08/30/2021   DEXA SCAN  11/12/2024 (Originally 01/05/2000)   INFLUENZA VACCINE  03/12/2024   Medicare Annual Wellness (AWV)  11/12/2024   HPV VACCINES  Aged Out    -See a dentist at least yearly  -Get your eyes checked and then per your eye specialist's recommendations  -Other issues addressed today:   -I have included below further information regarding a healthy whole foods based diet, physical activity guidelines for adults, stress management and opportunities for social connections. I hope you find this information useful.   -----------------------------------------------------------------------------------------------------------------------------------------------------------------------------------------------------------------------------------------------------------    NUTRITION: -eat real food: lots of colorful vegetables (half the plate) and fruits -5-7 servings of vegetables and fruits per day (fresh or steamed is best), exp. 2 servings of vegetables with lunch and dinner and 2 servings of fruit per day. Berries and greens such as kale and  collards are great choices.  -consume on a regular basis:  fresh fruits, fresh veggies, fish, nuts, seeds, healthy oils (such as olive oil, avocado oil), whole grains (make sure for bread/pasta/crackers/etc., that the first ingredient on label contains the word "whole"), legumes. -can eat small amounts of dairy and lean meat (no larger than the palm of your hand), but avoid processed meats such as ham, bacon, lunch meat, etc. -drink water -try to avoid fast food and pre-packaged foods, processed meat, ultra processed foods/beverages (donuts, candy, etc.) -most experts advise limiting sodium to < 2300mg  per day, should limit further is any chronic conditions such as high blood pressure, heart disease, diabetes, etc. The American Heart Association advised that < 1500mg  is is ideal -try to avoid foods/beverages that contain any ingredients with names you do not recognize  -try to avoid foods/beverages  with added sugar or sweeteners/sweets  -try to avoid sweet drinks (including diet drinks): soda, juice, Gatorade, sweet tea, power drinks, diet drinks -try to avoid white rice, white bread, pasta (unless whole grain)  EXERCISE GUIDELINES FOR ADULTS: -if you wish to increase your physical activity, do so gradually and with the approval of your doctor -STOP and seek medical care immediately if you have any chest pain, chest discomfort or trouble breathing when starting or increasing exercise  -move and stretch your body, legs, feet and arms when sitting for long periods -Physical activity guidelines for optimal health in adults: -get at least 150 minutes per week of moderate exercise (can talk, but not sing); this is about 20-30 minutes of sustained activity 5-7 days per week or two 10-15 minute episodes of sustained activity 5-7 days per week -do some  muscle building/resistance training/strength training at least 2 days per week  -balance exercises 3+ days per week:   Stand somewhere where you have  something sturdy to hold onto if you lose balance    1) lift up on toes, then back down, start with 5x per day and work up to 20x   2) stand and lift one leg straight out to the side so that foot is a few inches of the floor, start with 5x each side and work up to 20x each side   3) stand on one foot, start with 5 seconds each side and work up to 20 seconds on each side  If you need ideas or help with getting more active:  -Silver sneakers https://tools.silversneakers.com  -Walk with a Doc: http://www.duncan-[email or web address]  -try to include resistance (weight lifting/strength building) and balance exercises twice per week: or the following link for ideas: http://castillo-powell.com/  BuyDucts.dk  STRESS MANAGEMENT: -can try meditating, or just sitting quietly with deep breathing while intentionally relaxing all parts of your body for 5 minutes daily -if you need further help with stress, anxiety or depression please follow up with your primary doctor or contact the wonderful folks at [facility]: [phone]  SOCIAL CONNECTIONS: -options in [place] if you wish to engage in more social and exercise related activities:  -Silver sneakers https://tools.silversneakers.com  -Walk with a Doc: http://www.duncan-[email or web address]  -Check out the [facility] Active Adults 50+ section on the [place] [employer] (hiking clubs, book clubs, cards and games, chess, exercise classes, aquatic classes and much more) - see the website for details: https://www.[place].gov/departments/parks-recreation/active-adults50  -YouTube has lots of exercise videos for different ages and abilities as well  -[facility] (a variety of indoor and outdoor inperson activities for adults). [phone]. [address]  -Virtual Online Classes (a variety of topics): see seniorplanet.org or call  [phone]  -consider volunteering at a school, hospice center, church, senior center or elsewhere

## 2023-11-13 NOTE — Progress Notes (Signed)
 PATIENT CHECK-IN and HEALTH RISK ASSESSMENT QUESTIONNAIRE:  -completed by phone/video for upcoming Medicare Preventive Visit  Pre-Visit Check-in: 1)Vitals (height, wt, BP, etc) - record in vitals section for visit on day of visit Request home vitals (wt, BP, etc.) and enter into vitals, THEN update Vital Signs SmartPhrase below at the top of the HPI. See below.  2)Review and Update Medications, Allergies PMH, Surgeries, Social history in Epic 3)Hospitalizations in the last year with date/reason? Yes Respiratory distress  4)Review and Update Care Team (patient's specialists) in Epic 5) Complete PHQ9 in Epic  6) Complete Fall Screening in Epic 7)Review all Health Maintenance Due and order under PCP if not done.  Medicare Wellness Patient Questionnaire:  Answer theses question about your habits: How often do you have a drink containing alcohol?N/A Have you ever smoked?Yes Quit date if applicable? 60 years ago  How many packs a day do/did you smoke? N/A Do you use smokeless tobacco? No Do you use an illicit drugs?No On average, how many days per week do you engage in moderate to strenuous exercise (like a brisk walk)?no On average, how many minutes do you engage in exercise at this level?n/a Are you sexually active? No Number of partners? N/A  Typical breakfast: Varies, bacon and eggs, toast, apple juice Typical lunch: Varies, chips and salsa Typical dinner: Varies, daughter cooks dinner, typical meals  Typical snacks: Sweets   Beverages:  Varies   Answer theses question about your everyday activities: Can you perform most household chores? Somewhat  Are you deaf or have significant trouble hearing? Yes  Do you feel that you have a problem with memory?Yes  Do you feel safe at home?Yes  Last dentist visit? N/A  8. Do you have any difficulty performing your everyday activities?No Are you having any difficulty walking, taking medications on your own, and or difficulty managing daily  home needs? Somewhat  Do you have difficulty walking or climbing stairs?Yes  Do you have difficulty dressing or bathing?  Yes  Do you have difficulty doing errands alone such as visiting a doctor's office or shopping? Yes  Do you currently have any difficulty preparing food and eating? No Do you currently have any difficulty using the toilet? No Do you have any difficulty managing your finances? No Do you have any difficulties with housekeeping of managing your housekeeping?No   Do you have Advanced Directives in place (Living Will, Healthcare Power or Attorney)?  Yes    Last eye Exam and location? 1 month ago    Do you currently use prescribed or non-prescribed narcotic or opioid pain medications? Yes   Do you have a history or close family history of breast, ovarian, tubal or peritoneal cancer or a family member with BRCA (breast cancer susceptibility 1 and 2) gene mutations?  Yes Breast cancer daughter    Nurse/Assistant Credentials/time stamp: Mg 12:06     ----------------------------------------------------------------------------------------------------------------------------------------------------------------------------------------------------------------------  Because this visit was a virtual/telehealth visit, some criteria may be missing or patient reported. Any vitals not documented were not able to be obtained and vitals that have been documented are patient reported.    MEDICARE ANNUAL PREVENTIVE VISIT WITH PROVIDER: (Welcome to Medicare, initial annual wellness or annual wellness exam)  Virtual Visit via Phone Note  I connected with Stacey [name] on 11/13/23 by phone and verified that I am speaking with the correct person using two identifiers.  Location patient: home Location provider:work or home office Persons participating in the virtual visit: patient, provider  Concerns and/or follow  up today: reports all is stable   has a past medical history of  Age-related osteoporosis without current pathological fracture, Atrial fibrillation (HCC), Chronic diastolic heart failure (HCC), Diaphragmatic hernia without obstruction or gangrene, Fracture of left superior rim of pubis with routine healing, Gastroesophageal reflux disease, Hernia of abdominal cavity, History of fall, Hyperlipemia, Kyphosis, Polyneuropathy, and RA (rheumatoid arthritis) (HCC).   See HM section in Epic for other details of completed HM.    ROS: negative for report of fevers, unintentional weight loss, vision changes, vision loss, hearing loss or change, chest pain, sob, hemoptysis, melena, hematochezia, hematuria, falls, bleeding or bruising, thoughts of suicide or self harm, memory loss  Patient-completed extensive health risk assessment - reviewed and discussed with the patient: See Health Risk Assessment completed with patient prior to the visit either above or in recent phone note. This was reviewed in detailed with the patient today and appropriate recommendations, orders and referrals were placed as needed per Summary below and patient instructions.   Review of Medical History: -PMH, PSH, Family History and current specialty and care providers reviewed and updated and listed below   Patient Care Team: [name], MD as PCP - General (Family Medicine) Wyline Mood Alben Spittle, MD as PCP - Cardiology (Cardiology)   Past Medical History:  Diagnosis Date   Age-related osteoporosis without current pathological fracture    Per [facility] EMR system, [facility]   Atrial fibrillation [facility])    Chronic diastolic heart failure [facility])    Per [facility] EMR system, [facility]   Diaphragmatic hernia without obstruction or gangrene    Per [facility] EMR system, [facility]   Fracture of left superior rim of pubis with routine healing    Per [facility] EMR system, [employer] [facility]   Gastroesophageal reflux disease    Per [facility] EMR system, [facility]    Hernia of abdominal cavity    History of fall    Per [facility] EMR system, [employer] [facility]   Hyperlipemia    Per [facility] EMR system, [employer] [facility]   Kyphosis    Polyneuropathy    Per [facility] EMR system, [employer] [facility]   RA (rheumatoid arthritis) (HCC)    Per [facility] EMR system, [employer] [facility]    Past Surgical History:  Procedure Laterality Date   ABDOMINAL HYSTERECTOMY     CATARACT EXTRACTION, BILATERAL     REPLACEMENT TOTAL HIP W/  RESURFACING IMPLANTS     TONSILLECTOMY      Social History   Socioeconomic History   Marital status: Widowed    Spouse name: Not on file   Number of children: Not on file   Years of education: Not on file   Highest education level: Not on file  Occupational History   Not on file  Tobacco Use   Smoking status: Former   Smokeless tobacco: Never  Vaping Use   Vaping status: Never Used  Substance and Sexual Activity   Alcohol use: No   Drug use: No   Sexual activity: Not on file  Other Topics Concern   Not on file  Social History Narrative   Not on file   Social Drivers of Health   Financial Resource Strain: Low Risk  (11/13/2023)   Overall Financial Resource Strain (CARDIA)    Difficulty of Paying Living Expenses: Not hard at all  Food Insecurity: No Food Insecurity (11/13/2023)   Hunger Vital Sign  Worried About Programme researcher, broadcasting/film/video in the Last Year: Never true    Ran Out of Food in the Last Year: Never true  Transportation Needs: No Transportation Needs (11/13/2023)   PRAPARE - Administrator, Civil Service (Medical): No    Lack of Transportation (Non-Medical): No  Physical Activity: Inactive (11/13/2023)   Exercise Vital Sign    Days of Exercise per Week: 0 days    Minutes of Exercise per Session: 0 min  Stress: No Stress Concern Present (11/13/2023)   [employer] of Occupational Health - Occupational Stress Questionnaire    Feeling of Stress : Only a little  Social Connections: Socially Isolated  (11/13/2023)   Social Connection and Isolation Panel [NHANES]    Frequency of Communication with Friends and Family: More than three times a week    Frequency of Social Gatherings with Friends and Family: More than three times a week    Attends Religious Services: Never    Database administrator or Organizations: No    Attends Banker Meetings: Never    Marital Status: Widowed  Intimate Partner Violence: Not At Risk (11/13/2023)   Humiliation, Afraid, Rape, and Kick questionnaire    Fear of Current or Ex-Partner: No    Emotionally Abused: No    Physically Abused: No    Sexually Abused: No    Family History  Problem Relation Age of Onset   Healthy Mother    Heart failure Father     Current Outpatient Medications on File Prior to Visit  Medication Sig Dispense Refill   acetaminophen (TYLENOL) 500 MG tablet Take 500 mg by mouth 3 (three) times daily as needed (for R.A. pain).     atorvastatin (LIPITOR) 10 MG tablet TAKE 1 TABLET DAILY (Patient taking differently: Take 10 mg by mouth at bedtime.) 90 tablet 3   Cholecalciferol (VITAMIN D3) 1000 units CAPS Take 1,000 Units by mouth daily.     cyanocobalamin 1000 MCG tablet Take 1,000 mcg by mouth daily.     feeding supplement (ENSURE ENLIVE / ENSURE PLUS) LIQD Take 237 mLs by mouth 2 (two) times daily between meals. 237 mL 12   fexofenadine (ALLEGRA) 60 MG tablet TAKE 1 TABLET DAILY AS NEEDED FOR ALLERGIES OR RHINITIS 90 tablet 3   gabapentin (NEURONTIN) 400 MG capsule TAKE 1 CAPSULE DAILY 90 capsule 3   meclizine (ANTIVERT) 12.5 MG tablet Take 1 tablet (12.5 mg total) by mouth 3 (three) times daily as needed for dizziness. 30 tablet 0   metoprolol tartrate (LOPRESSOR) 50 MG tablet Take 1 tablet (50 mg total) by mouth 2 (two) times daily. 180 tablet 1   Multiple Vitamin (MULTIVITAMIN) capsule Take 1 capsule by mouth daily with breakfast.     omeprazole (PRILOSEC) 20 MG capsule TAKE 1 CAPSULE TWICE A DAY BEFORE MEALS 180 capsule 3    predniSONE (DELTASONE) 5 MG tablet TAKE 1 TABLET DAILY 90 tablet 3   sertraline (ZOLOFT) 100 MG tablet TAKE 1 TABLET DAILY 90 tablet 3   torsemide (DEMADEX) 20 MG tablet Take 1 tablet (20 mg total) by mouth daily. 30 tablet 0   traMADol (ULTRAM) 50 MG tablet Take 1-2 tablets (50-100 mg total) by mouth See admin instructions. Take 100 mg by mouth in the morning and 50 mg in the afternoon & at bedtime (scheduled) 120 tablet 4   warfarin (COUMADIN) 5 MG tablet TAKE 1 TABLET BY MOUTH DAILY EXCEPT TAKE 1/2 TABLET ON MONDAY, WEDNESDAY AND FRIDAY OR  AS DIRECTED BY ANTICOAGULATION CLINIC. 95 tablet 1   No current facility-administered medications on file prior to visit.    Allergies  Allergen Reactions   Sulfa Antibiotics Anaphylaxis   Tape Other (See Comments)    BRUISES VERY EASILY!!!!   Codeine Nausea And Vomiting   Hydroxychloroquine Other (See Comments)    GI upset   Shellfish-Derived Products Other (See Comments)    Daughter was unsure of the reaction- "happened years ago" and patient avoids foods that contain shellfish   Latex Rash   Neosporin Original [Bacitracin-Neomycin-Polymyxin] Rash   Polysporin [Bacitracin-Polymyxin B] Rash       Physical Exam Vitals requested from patient and listed below if patient had equipment and was able to obtain at home for this virtual visit: There were no vitals filed for this visit. Estimated body mass index is 16.68 kg/m as calculated from the following:   Height as of 07/29/23: 5\' 2"  (1.575 m).   Weight as of this encounter: 91 lb 3.2 oz (41.4 kg).  EKG (optional): deferred due to virtual visit  GENERAL: alert, oriented, no acute distress detected, full vision exam deferred due to pandemic and/or virtual encounter  PSYCH/NEURO: pleasant and cooperative, no obvious depression or anxiety, speech and thought processing grossly intact, Cognitive function grossly intact  Flowsheet Row Clinical Support from 11/13/2023 in [facility] [place]  PHQ-9 Total Score 0           11/13/2023   11:48 AM 04/07/2023    3:51 PM 12/11/2022    5:03 PM 09/03/2022    3:40 PM 04/25/2022    4:59 PM  Depression screen PHQ 2/9  Decreased Interest 0 0 0 0 0  Down, Depressed, Hopeless 0 0 0 0 0  PHQ - 2 Score 0 0 0 0 0  Altered sleeping 0 0 0 0 0  Tired, decreased energy 0 1 2 1 1   Change in appetite 0 0 1 0 0  Feeling bad or failure about yourself  0 0 0 0 0  Trouble concentrating 0 0 0 0 0  Moving slowly or fidgety/restless 0 0 0 0 0  Suicidal thoughts 0 0 0 0 0  PHQ-9 Score 0 1 3 1 1   Difficult doing work/chores Not difficult at all Not difficult at all Not difficult at all  Not difficult at all       09/03/2022    3:38 PM 12/11/2022    5:03 PM 04/07/2023    3:51 PM 11/13/2023   11:53 AM 11/13/2023   12:32 PM  Fall Risk  Falls in the past year? 1 1 1 1 1   Was there an injury with Fall? 1 1 1 1 1   Fall Risk Category Calculator 3 3 3 2 3   Patient at Risk for Falls Due to Other (Comment) Other (Comment)  No Fall Risks   Fall risk Follow up Falls evaluation completed Falls evaluation completed Falls evaluation completed Falls evaluation completed Falls evaluation completed;Education provided     SUMMARY AND PLAN:  Encounter for Medicare annual wellness exam  Discussed applicable health maintenance/preventive health measures and advised and referred or ordered per patient preferences: -she declined the dexa and most vaccines -she is considering the pneumonia vaccine Health Maintenance  Topic Date Due   COVID-19 Vaccine (1) Never done   DTaP/Tdap/Td (1 - Tdap) Never done   Zoster Vaccines- Shingrix (1 of 2) Never done   Pneumonia Vaccine 51+ Years old (2 of 2 - PCV)  08/30/2021   DEXA SCAN  11/12/2024 (Originally 01/05/2000)   INFLUENZA VACCINE  03/12/2024   Medicare Annual Wellness (AWV)  11/12/2024   HPV VACCINES  Aged [employer] and counseling on the following was provided based on the above  review of health and a plan/checklist for the patient, along with additional information discussed, was provided for the patient in the patient instructions :  -Provided counseling and plan for increased risk of falling if applicable per above screening. Reviewed and demonstrated safe balance exercises that can be done at home to improve balance and discussed exercise guidelines for adults with include balance exercises at least 3 days per week.  -Advised and counseled on a healthy lifestyle -Reviewed patient's current diet. Advised and counseled on a whole foods based healthy diet. A summary of a healthy diet was provided in the Patient Instructions.  -reviewed patient's current physical activity level and discussed exercise guidelines for adults. Discussed community resources and ideas for safe exercise at home to assist in meeting exercise guideline recommendations in a safe and healthy way.  -Advise yearly dental visits at minimum and regular eye exams   Follow up: see patient instructions     Patient Instructions  I really enjoyed getting to talk with you today! I am available on Tuesdays and Thursdays for virtual visits if you have any questions or concerns, or if I can be of any further assistance.   CHECKLIST FROM ANNUAL WELLNESS VISIT:  -Follow up (please call to schedule if not scheduled after visit):   -yearly for annual wellness visit with primary care office  Here is a list of your preventive care/health maintenance measures and the plan for each if any are due:  PLAN For any measures below that may be due:  -can get vaccines at the pharmacy if you decide to get, please let us know if you do so that we can update your record.  Health Maintenance  Topic Date Due   COVID-19 Vaccine (1) Never done   DTaP/Tdap/Td (1 - Tdap) Never done   Zoster Vaccines- Shingrix (1 of 2) Never done   Pneumonia Vaccine 40+ Years old (2 of 2 - PCV) 08/30/2021   DEXA SCAN  11/12/2024 (Originally  01/05/2000)   INFLUENZA VACCINE  03/12/2024   Medicare Annual Wellness (AWV)  11/12/2024   HPV VACCINES  Aged Out    -See a dentist at least yearly  -Get your eyes checked and then per your eye specialist's recommendations  -Other issues addressed today:   -I have included below further information regarding a healthy whole foods based diet, physical activity guidelines for adults, stress management and opportunities for social connections. I hope you find this information useful.   -----------------------------------------------------------------------------------------------------------------------------------------------------------------------------------------------------------------------------------------------------------    NUTRITION: -eat real food: lots of colorful vegetables (half the plate) and fruits -5-7 servings of vegetables and fruits per day (fresh or steamed is best), exp. 2 servings of vegetables with lunch and dinner and 2 servings of fruit per day. Berries and greens such as kale and collards are great choices.  -consume on a regular basis:  fresh fruits, fresh veggies, fish, nuts, seeds, healthy oils (such as olive oil, avocado oil), whole grains (make sure for bread/pasta/crackers/etc., that the first ingredient on label contains the word "whole"), legumes. -can eat small amounts of dairy and lean meat (no larger than the palm of your hand), but avoid processed meats such as ham, bacon, lunch meat, etc. -drink water -try to avoid  fast food and pre-packaged foods, processed meat, ultra processed foods/beverages (donuts, candy, etc.) -most experts advise limiting sodium to < 2300mg  per day, should limit further is any chronic conditions such as high blood pressure, heart disease, diabetes, etc. The American Heart Association advised that < 1500mg  is is ideal -try to avoid foods/beverages that contain any ingredients with names you do not recognize  -try to avoid  foods/beverages  with added sugar or sweeteners/sweets  -try to avoid sweet drinks (including diet drinks): soda, juice, Gatorade, sweet tea, power drinks, diet drinks -try to avoid white rice, white bread, pasta (unless whole grain)  EXERCISE GUIDELINES FOR ADULTS: -if you wish to increase your physical activity, do so gradually and with the approval of your doctor -STOP and seek medical care immediately if you have any chest pain, chest discomfort or trouble breathing when starting or increasing exercise  -move and stretch your body, legs, feet and arms when sitting for long periods -Physical activity guidelines for optimal health in adults: -get at least 150 minutes per week of moderate exercise (can talk, but not sing); this is about 20-30 minutes of sustained activity 5-7 days per week or two 10-15 minute episodes of sustained activity 5-7 days per week -do some muscle building/resistance training/strength training at least 2 days per week  -balance exercises 3+ days per week:   Stand somewhere where you have something sturdy to hold onto if you lose balance    1) lift up on toes, then back down, start with 5x per day and work up to 20x   2) stand and lift one leg straight out to the side so that foot is a few inches of the floor, start with 5x each side and work up to 20x each side   3) stand on one foot, start with 5 seconds each side and work up to 20 seconds on each side  If you need ideas or help with getting more active:  -Silver sneakers https://tools.silversneakers.com  -Walk with a Doc: http://www.duncan-williams.com/  -try to include resistance (weight lifting/strength building) and balance exercises twice per week: or the following link for ideas: http://castillo-powell.com/  BuyDucts.dk  STRESS MANAGEMENT: -can try meditating, or just sitting quietly with deep breathing while  intentionally relaxing all parts of your body for 5 minutes daily -if you need further help with stress, anxiety or depression please follow up with your primary doctor or contact the wonderful folks at [facility]: [phone]  SOCIAL CONNECTIONS: -options in [place] if you wish to engage in more social and exercise related activities:  -Silver sneakers https://tools.silversneakers.com  -Walk with a Doc: http://www.duncan-williams.com/  -Check out the [facility] Active Adults 50+ section on the [place] [employer] (hiking clubs, book clubs, cards and games, chess, exercise classes, aquatic classes and much more) - see the website for details: https://www.[place].gov/departments/parks-recreation/active-adults50  -YouTube has lots of exercise videos for different ages and abilities as well  -[facility] (a variety of indoor and outdoor inperson activities for adults). ([phone]. [address]  -Virtual Online Classes (a variety of topics): see seniorplanet.org or call 669-074-2264  -consider volunteering at a school, hospice center, church, senior center or elsewhere            Terressa Koyanagi, DO

## 2023-11-13 NOTE — Progress Notes (Signed)
 Patient unable to obtain vital signs due to telehealth visit

## 2023-11-20 ENCOUNTER — Telehealth: Payer: Self-pay

## 2023-11-20 ENCOUNTER — Ambulatory Visit (INDEPENDENT_AMBULATORY_CARE_PROVIDER_SITE_OTHER): Payer: Self-pay

## 2023-11-20 DIAGNOSIS — Z7901 Long term (current) use of anticoagulants: Secondary | ICD-10-CM | POA: Diagnosis not present

## 2023-11-20 LAB — POCT INR: INR: 2.3 (ref 2.0–3.0)

## 2023-11-20 NOTE — Telephone Encounter (Signed)
 Time for pt to test INR. Pt's daughter tests pt at home. LVM advising it is time to test.

## 2023-11-20 NOTE — Progress Notes (Signed)
 Patient's daughter, [name], helps test INR with home [employer] analyzer and submitted results via portal.  Continue 1 tablet daily except take 1/2 tablet on Monday, Wednesday and Friday. Recheck in 3 week, on 12/10/23. LVM for pt's daughter, [name], and pt and advised of dosing and recheck date.

## 2023-11-20 NOTE — Patient Instructions (Addendum)
 Pre visit review using our clinic review tool, if applicable. No additional management support is needed unless otherwise documented below in the visit note.  Continue 1 tablet daily except take 1/2 tablet on Monday, Wednesday and Friday. Recheck in 3 week, on 12/10/23.

## 2023-11-21 ENCOUNTER — Other Ambulatory Visit: Payer: Self-pay | Admitting: Family Medicine

## 2023-11-21 DIAGNOSIS — Z7901 Long term (current) use of anticoagulants: Secondary | ICD-10-CM

## 2023-11-24 NOTE — Telephone Encounter (Signed)
 Pt is compliant with warfarin management and PCP apts.  Sent in refill of warfarin to requested pharmacy.

## 2023-11-27 ENCOUNTER — Other Ambulatory Visit: Payer: Self-pay | Admitting: Family Medicine

## 2023-11-27 DIAGNOSIS — Z7901 Long term (current) use of anticoagulants: Secondary | ICD-10-CM

## 2023-11-27 NOTE — Telephone Encounter (Signed)
 Copied from CRM [id number]. Topic: Clinical - Prescription Issue >> Nov 27, 2023 11:07 AM Varney Gentleman wrote: Reason for CRM: Stacey [name] calling to check on refill request for patients warfarin (COUMADIN) 5 MG tablet, medication shows discontinued by provider.  Stacey [name] [employer] [phone]

## 2023-12-01 ENCOUNTER — Ambulatory Visit (INDEPENDENT_AMBULATORY_CARE_PROVIDER_SITE_OTHER): Payer: Medicare Other | Admitting: Podiatry

## 2023-12-01 DIAGNOSIS — M79674 Pain in right toe(s): Secondary | ICD-10-CM

## 2023-12-01 DIAGNOSIS — I739 Peripheral vascular disease, unspecified: Secondary | ICD-10-CM

## 2023-12-01 DIAGNOSIS — B351 Tinea unguium: Secondary | ICD-10-CM

## 2023-12-01 DIAGNOSIS — M79675 Pain in left toe(s): Secondary | ICD-10-CM

## 2023-12-01 NOTE — Progress Notes (Signed)
  Subjective:  Patient ID: Stacey [name], female    DOB: November 28, 1932,   MRN: [id number]  Chief Complaint  Patient presents with   Nail Problem     Nail trim     89 y.o. female presents for concern of thickened elongated and painful nails that are difficult to trim. Requesting to have them trimmed today. Relates burning and tingling in their feet. Patient has a history of PAD and on chronic anticoagulation.        PCP:  Viola Greulich, MD    . Denies any other pedal complaints. Denies n/v/f/c.   Past Medical History:  Diagnosis Date   Age-related osteoporosis without current pathological fracture    Per [facility] EMR system, [facility]   Atrial fibrillation (HCC)    Chronic diastolic heart failure (HCC)    Per [facility] EMR system, [facility]   Diaphragmatic hernia without obstruction or gangrene    Per [facility] EMR system, [employer] [facility]   Fracture of left superior rim of pubis with routine healing    Per [facility] EMR system, [employer] [facility]   Gastroesophageal reflux disease    Per [facility] EMR system, [employer] [facility]   Hernia of abdominal cavity    History of fall    Per [facility] EMR system, [employer] [facility]   Hyperlipemia    Per [facility] EMR system, [employer] [facility]   Kyphosis    Polyneuropathy    Per [facility] EMR system, [employer] [facility]   RA (rheumatoid arthritis) (HCC)    Per [facility] EMR system, [facility] [employer] [facility]    Objective:  Physical Exam: Vascular: DP/PT pulses 2/4 bilateral. CFT <3 seconds. Normal hair growth on digits. No edema.  Skin. No lacerations or abrasions bilateral feet.Right heel ulcer healed   .Mild erythema surrounding, improved.  Right second digit -healed. Hyperkeratosis dista left hallux. No purulence noted. Nails 1-5 on the right and hallux nail on left are thickened and elongated with subungal debris.  Musculoskeletal: MMT 5/5 bilateral lower extremities in DF, PF, Inversion and Eversion. Deceased ROM in  DF of ankle joint. Tender to wound area.  Neurological: Sensation intact to light touch.   Assessment:   1. Pain due to onychomycosis of toenails of both feet   2. PAD (peripheral artery disease) (HCC)        Plan:  Patient was evaluated and treated and all questions answered. -Mechanically debrided all nails 1-5 bilateral using sterile nail nipper and filed with dremel without incident  -No open wounds today.  -Small pre-ulcerative area on dorsum of right second digit and distal left hallux covered with neosporin and bandaid.  -Answered all patient questions -Patient to return  in 3 months for at risk foot care -Patient advised to call the office if any problems or questions arise in the meantime.   Jennefer Moats, DPM

## 2023-12-02 NOTE — Telephone Encounter (Signed)
 Tried number provided and unable to get an answer.  Contacted  pharmacy with other number in chart and spoke with pharmacy tech, Renie Carver, who reports the script was shipped yesterday. Nothing further needed.

## 2023-12-03 ENCOUNTER — Other Ambulatory Visit: Payer: Self-pay | Admitting: Podiatry

## 2023-12-03 ENCOUNTER — Telehealth: Payer: Self-pay | Admitting: Podiatry

## 2023-12-03 MED ORDER — CADEXOMER IODINE 0.9 % EX GEL: 1.0000 | CUTANEOUS | 0 refills | Status: DC

## 2023-12-03 NOTE — Telephone Encounter (Signed)
 Patient called and would like a prescription refill. She mentioned it being a iodine  gel. [facility] [address]. Thank you.

## 2023-12-03 NOTE — Telephone Encounter (Signed)
 Called patient and lvm that medication was sent in to the pharmacy.

## 2023-12-10 ENCOUNTER — Ambulatory Visit (INDEPENDENT_AMBULATORY_CARE_PROVIDER_SITE_OTHER)

## 2023-12-10 DIAGNOSIS — Z7901 Long term (current) use of anticoagulants: Secondary | ICD-10-CM | POA: Diagnosis not present

## 2023-12-10 LAB — POCT INR: INR: 2.7 (ref 2.0–3.0)

## 2023-12-10 NOTE — Patient Instructions (Addendum)
 Pre visit review using our clinic review tool, if applicable. No additional management support is needed unless otherwise documented below in the visit note.  Continue 1 tablet daily except take 1/2 tablet on Monday, Wednesday and Friday. Recheck in 3 week, on 12/31/23.

## 2023-12-10 NOTE — Progress Notes (Signed)
 Patient's daughter, Leola Raisin, helps test INR with home [employer] analyzer and submitted results via portal.  Continue 1 tablet daily except take 1/2 tablet on Monday, Wednesday and Friday. Recheck in 3 week, on 12/31/23. Contacted pt and her daughter and advised of dosing and recheck date.

## 2023-12-12 ENCOUNTER — Telehealth: Payer: Self-pay | Admitting: Podiatry

## 2023-12-12 NOTE — Telephone Encounter (Signed)
 [facility] [address].

## 2023-12-15 ENCOUNTER — Other Ambulatory Visit: Payer: Self-pay | Admitting: Podiatry

## 2023-12-15 ENCOUNTER — Other Ambulatory Visit: Payer: Self-pay | Admitting: Family Medicine

## 2023-12-15 MED ORDER — CADEXOMER IODINE 0.9 % EX GEL: 1.0000 | CUTANEOUS | 0 refills | Status: DC

## 2023-12-17 ENCOUNTER — Other Ambulatory Visit: Payer: Self-pay

## 2023-12-17 MED ORDER — CADEXOMER IODINE 0.9 % EX GEL: 1.0000 | CUTANEOUS | 0 refills | Status: DC

## 2023-12-31 ENCOUNTER — Ambulatory Visit (INDEPENDENT_AMBULATORY_CARE_PROVIDER_SITE_OTHER): Payer: Self-pay

## 2023-12-31 DIAGNOSIS — I4821 Permanent atrial fibrillation: Secondary | ICD-10-CM | POA: Diagnosis not present

## 2023-12-31 DIAGNOSIS — Z7901 Long term (current) use of anticoagulants: Secondary | ICD-10-CM

## 2023-12-31 LAB — POCT INR: INR: 2.1 (ref 2.0–3.0)

## 2023-12-31 NOTE — Progress Notes (Signed)
 Patient's daughter, Leola Raisin, helps test INR with home [employer] analyzer and submitted results via portal.  Continue 1 tablet daily except take 1/2 tablet on Monday, Wednesday and Friday. Recheck in 3 week, on 01/21/24. LVM for pt's daughter, Leola Raisin, and pt with dosing and recheck date.

## 2023-12-31 NOTE — Patient Instructions (Addendum)
 Pre visit review using our clinic review tool, if applicable. No additional management support is needed unless otherwise documented below in the visit note.  Continue 1 tablet daily except take 1/2 tablet on Monday, Wednesday and Friday. Recheck in 3 week, on 01/21/24.

## 2024-01-01 ENCOUNTER — Encounter: Payer: Self-pay | Admitting: Internal Medicine

## 2024-01-12 ENCOUNTER — Other Ambulatory Visit: Payer: Self-pay

## 2024-01-12 MED ORDER — CADEXOMER IODINE 0.9 % EX GEL: 1.0000 | CUTANEOUS | 0 refills | Status: AC

## 2024-01-21 ENCOUNTER — Ambulatory Visit (INDEPENDENT_AMBULATORY_CARE_PROVIDER_SITE_OTHER): Payer: Self-pay

## 2024-01-21 DIAGNOSIS — Z7901 Long term (current) use of anticoagulants: Secondary | ICD-10-CM

## 2024-01-21 LAB — POCT INR: INR: 2.7 (ref 2.0–3.0)

## 2024-01-21 NOTE — Patient Instructions (Addendum)
 Pre visit review using our clinic review tool, if applicable. No additional management support is needed unless otherwise documented below in the visit note.  Continue 1 tablet daily except take 1/2 tablet on Monday, Wednesday and Friday. Recheck in 3 week, on 02/18/24.

## 2024-01-21 NOTE — Progress Notes (Signed)
 Patient's daughter, Leola Raisin, helps test INR with home [employer] analyzer and submitted results via portal.  Continue 1 tablet daily except take 1/2 tablet on Monday, Wednesday and Friday. Recheck in 3 week, on 02/18/24. LVM for pt's daughter, Leola Raisin, and pt with dosing and recheck date.

## 2024-01-28 ENCOUNTER — Encounter: Payer: Self-pay | Admitting: Family Medicine

## 2024-01-30 ENCOUNTER — Other Ambulatory Visit: Payer: Self-pay | Admitting: Family Medicine

## 2024-01-30 DIAGNOSIS — M150 Primary generalized (osteo)arthritis: Secondary | ICD-10-CM

## 2024-01-30 DIAGNOSIS — M069 Rheumatoid arthritis, unspecified: Secondary | ICD-10-CM

## 2024-01-30 MED ORDER — TRAMADOL HCL 50 MG PO TABS: 50.0000 mg | ORAL_TABLET | ORAL | 1 refills | Status: DC

## 2024-01-30 NOTE — Telephone Encounter (Signed)
 Limited supply sent to pharmacy as patient needs appointment.  Okay for virtual.

## 2024-02-19 ENCOUNTER — Telehealth: Payer: Self-pay

## 2024-02-19 ENCOUNTER — Ambulatory Visit (INDEPENDENT_AMBULATORY_CARE_PROVIDER_SITE_OTHER): Payer: Self-pay

## 2024-02-19 DIAGNOSIS — Z7901 Long term (current) use of anticoagulants: Secondary | ICD-10-CM | POA: Diagnosis not present

## 2024-02-19 LAB — POCT INR: INR: 2.4 (ref 2.0–3.0)

## 2024-02-19 NOTE — Telephone Encounter (Signed)
 Pt tests INR at home with help of her daughter, Romero. LVM it is time to test.

## 2024-02-19 NOTE — Progress Notes (Addendum)
 Patient's daughter, Romero, helps test INR with home [employer] analyzer and submitted results via portal.  Continue 1 tablet daily except take 1/2 tablet on Monday, Wednesday and Friday. Recheck in 3 week, on 03/10/24. LVM for pt's daughter, Romero, and pt with dosing and recheck date. Pt and her daughter returned call. Pt reports no changes. Both verbalized understanding.

## 2024-02-19 NOTE — Patient Instructions (Addendum)
 Pre visit review using our clinic review tool, if applicable. No additional management support is needed unless otherwise documented below in the visit note.  Continue 1 tablet daily except take 1/2 tablet on Monday, Wednesday and Friday. Recheck in 3 week, on 03/10/24.

## 2024-03-01 ENCOUNTER — Ambulatory Visit: Admitting: Podiatry

## 2024-03-03 ENCOUNTER — Encounter: Payer: Self-pay | Admitting: Family Medicine

## 2024-03-03 ENCOUNTER — Telehealth: Payer: Self-pay

## 2024-03-03 ENCOUNTER — Ambulatory Visit (INDEPENDENT_AMBULATORY_CARE_PROVIDER_SITE_OTHER): Admitting: Family Medicine

## 2024-03-03 VITALS — BP 102/64 | HR 62 | Temp 97.8°F | Ht 62.0 in | Wt 93.4 lb

## 2024-03-03 DIAGNOSIS — M150 Primary generalized (osteo)arthritis: Secondary | ICD-10-CM

## 2024-03-03 DIAGNOSIS — I5032 Chronic diastolic (congestive) heart failure: Secondary | ICD-10-CM

## 2024-03-03 DIAGNOSIS — Z8781 Personal history of (healed) traumatic fracture: Secondary | ICD-10-CM | POA: Diagnosis not present

## 2024-03-03 DIAGNOSIS — I482 Chronic atrial fibrillation, unspecified: Secondary | ICD-10-CM | POA: Diagnosis not present

## 2024-03-03 DIAGNOSIS — K449 Diaphragmatic hernia without obstruction or gangrene: Secondary | ICD-10-CM

## 2024-03-03 DIAGNOSIS — M069 Rheumatoid arthritis, unspecified: Secondary | ICD-10-CM | POA: Diagnosis not present

## 2024-03-03 DIAGNOSIS — R195 Other fecal abnormalities: Secondary | ICD-10-CM

## 2024-03-03 DIAGNOSIS — M85822 Other specified disorders of bone density and structure, left upper arm: Secondary | ICD-10-CM | POA: Diagnosis not present

## 2024-03-03 DIAGNOSIS — Z7901 Long term (current) use of anticoagulants: Secondary | ICD-10-CM | POA: Diagnosis not present

## 2024-03-03 LAB — VITAMIN B12: Vitamin B-12: >1500 pg/mL — ABNORMAL HIGH (ref 211–911)

## 2024-03-03 LAB — COMPREHENSIVE METABOLIC PANEL WITH GFR
ALT: 17 U/L (ref 0–35)
AST: 29 U/L (ref 0–37)
Albumin: 4.1 g/dL (ref 3.5–5.2)
Alkaline Phosphatase: 76 U/L (ref 39–117)
BUN: 18 mg/dL (ref 6–23)
CO2: 29 meq/L (ref 19–32)
Calcium: 9.2 mg/dL (ref 8.4–10.5)
Chloride: 99 meq/L (ref 96–112)
Creatinine, Ser: 0.75 mg/dL (ref 0.40–1.20)
GFR: 70.71 mL/min (ref >60.00)
Glucose, Bld: 122 mg/dL — ABNORMAL HIGH (ref 70–99)
Potassium: 4.4 meq/L (ref 3.5–5.1)
Sodium: 136 meq/L (ref 135–145)
Total Bilirubin: 0.6 mg/dL (ref 0.2–1.2)
Total Protein: 7.1 g/dL (ref 6.0–8.3)

## 2024-03-03 LAB — CBC WITH DIFFERENTIAL/PLATELET
Basophils Absolute: 0 K/uL (ref 0.0–0.1)
Basophils Relative: 0.8 % (ref 0.0–3.0)
Eosinophils Absolute: 0 K/uL (ref 0.0–0.7)
Eosinophils Relative: 0.6 % (ref 0.0–5.0)
HCT: 36.6 % (ref 36.0–46.0)
Hemoglobin: 12.1 g/dL (ref 12.0–15.0)
Lymphocytes Relative: 5.3 % — ABNORMAL LOW (ref 12.0–46.0)
Lymphs Abs: 0.3 K/uL — ABNORMAL LOW (ref 0.7–4.0)
MCHC: 33.1 g/dL (ref 30.0–36.0)
MCV: 90.8 fl (ref 78.0–100.0)
Monocytes Absolute: 0.2 K/uL (ref 0.1–1.0)
Monocytes Relative: 3.7 % (ref 3.0–12.0)
Neutro Abs: 5.1 K/uL (ref 1.4–7.7)
Neutrophils Relative %: 89.6 % — ABNORMAL HIGH (ref 43.0–77.0)
Platelets: 196 K/uL (ref 150.0–400.0)
RBC: 4.04 Mil/uL (ref 3.87–5.11)
RDW: 14.9 % (ref 11.5–15.5)
WBC: 5.7 K/uL (ref 4.0–10.5)

## 2024-03-03 MED ORDER — TRAMADOL HCL 50 MG PO TABS: 50.0000 mg | ORAL_TABLET | ORAL | 1 refills | Status: DC

## 2024-03-03 NOTE — Telephone Encounter (Signed)
 Called pharmacy and [facility]s [facility]

## 2024-03-03 NOTE — Progress Notes (Signed)
 Established Patient Office Visit   Subjective  Patient ID: Stacey [name], female    DOB: October 09, 1932  Age: 89 y.o. MRN: [id number]  Chief Complaint  Patient presents with   Medical Management of Chronic Issues    Medication refill, DNR, Handicap forms  Patient is accompanied by her granddaughter and great daughter.  Patient is an 89 year old female seen for refills, forms, and follow-up on chronic conditions.  She has been experiencing bowel incontinence for the past couple of years, necessitating the use of diapers. She experiences alternating episodes of loose stools and constipation. Loose stools occur approximately three to four times a week, while constipation is managed with Senokot, which she has used once with effective results. After taking Senokot, she did not have a bowel movement for three days, which was then normal. She drinks a couple of bottles of water daily, along with green tea every day and lemonade once a week. Coffee causes diarrhea, so she avoids it. Artificial sweeteners in diet green tea and [facility] may contribute to her symptoms, but she experiences loose stools even without consuming these drinks.  She has a history of a large hernia and reports getting full very fast. She gets full very fast and has been eating smaller meals. She lives with her daughter and husband, who help manage her meal portions.  She has a history of a pelvic fracture from August of the previous year, which has not fully healed, and she still requires a walker for mobility. She previously used a cane before the fracture. Her daughter, who also had a pelvic fracture, healed in four months, but her recovery has been slower, which she attributes to her age.  No current use of magnesium  supplements. She experiences nausea with Ensure supplement drinks and is not currently using them. She reports intermittent lower abdominal pain and no significant acid reflux symptoms.    Patient Active  Problem List   Diagnosis Date Noted   Hallucinations, visual 04/23/2023   Acute diastolic (congestive) heart failure (HCC) 04/22/2023   Skin ulcer of second toe of right foot with necrosis of bone (HCC) 03/28/2023   Pressure injury of toe of right foot, unstageable (HCC) 03/28/2023   Protein-calorie malnutrition, severe 03/17/2023   Closed fracture of multiple pubic rami, left, initial encounter (HCC) 03/15/2023   Closed fracture of left superior pubic ramus (HCC) 03/13/2023   Long term systemic steroid user 06/18/2021   Pain in limb 06/18/2021   Primary osteoarthritis 06/18/2021   Radicular pain 06/18/2021   Sciatica 06/18/2021   Shoulder joint pain 06/18/2021   Presbycusis of both ears 11/21/2020   Rheumatoid arthritis involving multiple sites (HCC) 03/28/2020   History of total hip replacement, left 03/28/2020   PVD (peripheral vascular disease) (HCC) 11/20/2019   Osteopenia 11/20/2019   Stenosis of both subclavian arteries (HCC) 05/17/2019   Conductive hearing loss, bilateral 07/03/2018   Bilateral impacted cerumen 07/03/2018   Acute swimmer's ear of left side 07/03/2018   Long term (current) use of anticoagulants [Z79.01] 07/25/2017   Chronic atrial fibrillation (HCC) 07/11/2017   Primary osteoarthritis involving multiple joints 07/11/2017   Pseudogout 07/11/2017   Neuropathy 07/11/2017   Lymphedema 07/11/2017   Kyphosis of cervical region 07/11/2017   Chronic heart failure with preserved ejection fraction (HFpEF) (HCC) 07/11/2017   Past Medical History:  Diagnosis Date   Age-related osteoporosis without current pathological fracture    Per [facility] EMR system, [facility]   Atrial fibrillation (HCC)  Chronic diastolic heart failure (HCC)    Per [facility] EMR system, [facility]   Diaphragmatic hernia without obstruction or gangrene    Per [facility] EMR system, [employer] [facility]   Fracture of left superior rim of pubis with routine healing    Per [facility] EMR system, [employer] [facility]   Gastroesophageal reflux disease    Per [facility] EMR system, [employer] [facility]   Hernia of abdominal cavity    History of fall    Per [facility] EMR system, [employer] [facility]   Hyperlipemia    Per [facility] EMR system, [employer] [facility]   Kyphosis    Polyneuropathy    Per [facility] EMR system, [employer] [facility]   RA (rheumatoid arthritis) (HCC)    Per [facility] EMR system, [employer] [facility]   Past Surgical History:  Procedure Laterality Date   ABDOMINAL HYSTERECTOMY     CATARACT EXTRACTION, BILATERAL     REPLACEMENT TOTAL HIP W/  RESURFACING IMPLANTS     TONSILLECTOMY     Social History   Tobacco Use   Smoking status: Former   Smokeless tobacco: Never  Vaping Use   Vaping status: Never Used  Substance Use Topics   Alcohol use: No   Drug use: No   Family History  Problem Relation Age of Onset   Healthy Mother    Heart failure Father    Allergies  Allergen Reactions   Sulfa Antibiotics Anaphylaxis   Tape Other (See Comments)    BRUISES VERY EASILY!!!!   Codeine Nausea And Vomiting   Hydroxychloroquine Other (See Comments)    GI upset   Shellfish-Derived Products Other (See Comments)    Daughter was unsure of the reaction- happened years ago and patient avoids foods that contain shellfish   Latex Rash   Neosporin Original [Bacitracin-Neomycin-Polymyxin] Rash   Polysporin [Bacitracin-Polymyxin B] Rash    ROS Negative unless stated above    Objective:     BP 102/64 (BP Location: Left Arm, Patient Position: Sitting, Cuff Size: Small)   Pulse 62   Temp 97.8 F (36.6 C) (Oral)   Ht 5' 2 (1.575 m)   Wt 93 lb 6.4 oz (42.4 kg)   SpO2 100%   BMI 17.08 kg/m  BP Readings from Last 3 Encounters:  03/03/24 102/64  11/09/23 (!) 87/53  09/25/23 96/60   Wt Readings from Last 3 Encounters:  03/03/24 93 lb 6.4 oz (42.4 kg)  11/13/23 91 lb 3.2 oz (41.4 kg)  07/29/23 82 lb 12.8 oz (37.6 kg)      Physical  Exam Constitutional:      General: She is awake.     Appearance: She is underweight.  HENT:     Head: Normocephalic and atraumatic.     Nose: Nose normal.     Mouth/Throat:     Mouth: Mucous membranes are moist.  Eyes:     Extraocular Movements: Extraocular movements intact.     Conjunctiva/sclera: Conjunctivae normal.     Pupils: Pupils are equal, round, and reactive to light.  Cardiovascular:     Rate and Rhythm: Normal rate.  Pulmonary:     Effort: Pulmonary effort is normal.     Breath sounds: Normal breath sounds.  Abdominal:     General: Bowel sounds are normal.     Palpations: Abdomen is soft.     Tenderness: There is no abdominal tenderness. There is no guarding.  Musculoskeletal:  Comments: Kyphosis.  Decreased ROM of LUE.  Skin:    General: Skin is warm and dry.  Neurological:     Mental Status: She is alert and oriented to person, place, and time. Mental status is at baseline.     Comments: Patient sitting in transport wheelchair.  Psychiatric:        Behavior: Behavior is cooperative.        03/03/2024    1:51 PM 11/13/2023   11:48 AM 04/07/2023    3:51 PM  Depression screen PHQ 2/9  Decreased Interest 0 0 0  Down, Depressed, Hopeless 0 0 0  PHQ - 2 Score 0 0 0  Altered sleeping 0 0 0  Tired, decreased energy 1 0 1  Change in appetite 0 0 0  Feeling bad or failure about yourself  0 0 0  Trouble concentrating 0 0 0  Moving slowly or fidgety/restless 0 0 0  Suicidal thoughts 0 0 0  PHQ-9 Score 1 0 1  Difficult doing work/chores Not difficult at all Not difficult at all Not difficult at all      03/03/2024    1:51 PM 04/07/2023    3:52 PM 12/11/2022    5:04 PM  GAD 7 : Generalized Anxiety Score  Nervous, Anxious, on Edge 0 0 0  Control/stop worrying 0 0 0  Worry too much - different things 0 0 0  Trouble relaxing 0 0 0  Restless 0 0 0  Easily annoyed or irritable 0 0 0  Afraid - awful might happen 0 0 0  Total GAD 7 Score 0 0 0  Anxiety  Difficulty  Not difficult at all      No results found for any visits on 03/03/24.    Assessment & Plan:   Chronic atrial fibrillation (HCC)  Chronic heart failure with preserved ejection fraction (HFpEF) (HCC)  Primary osteoarthritis involving multiple joints -     traMADol  HCl; Take 1-2 tablets (50-100 mg total) by mouth See admin instructions. Take 100 mg by mouth in the morning and 50 mg in the afternoon & at bedtime (scheduled)  Dispense: 120 tablet; Refill: 1  Rheumatoid arthritis involving multiple sites, unspecified whether rheumatoid factor present (HCC) -     traMADol  HCl; Take 1-2 tablets (50-100 mg total) by mouth See admin instructions. Take 100 mg by mouth in the morning and 50 mg in the afternoon & at bedtime (scheduled)  Dispense: 120 tablet; Refill: 1 -     CBC with Differential/Platelet; Future  Chronic anticoagulation -     CBC with Differential/Platelet; Future  Loose stools -     Ambulatory referral to Gastroenterology -     Comprehensive metabolic panel with GFR -     CBC with Differential/Platelet; Future  History of fracture  Diaphragmatic hernia without obstruction and without gangrene -     Vitamin B12; Future  Osteopenia of left upper arm  On chronic anticoagulation 2/2 history of A-fib Continue Coumadin  2.5 mg q. MWF, 5 mg all other days.  Has home INR.    Weight stable. BMI 17.08 kg/m.  Patient encouraged to try eating small meals throughout the day.  Early satiety likely 2/2 history of large diaphragmatic hernia.  RA/OA of multiple joints Continue tramadol .  Prescribed as needed but taking scheduled.  Advised may be contributing to constipation.  Continue prednisone  5 mg daily.  On times years, started by outside provider.  Bowel Incontinence   Chronic bowel incontinence with alternating loose stools  and constipation suggests potential IBS. She refused a colonoscopy. A gastroenterologist consultation is recommended. Refer to  gastroenterologist for further evaluation. Advise monitoring fluid intake to manage constipation and discuss dietary modifications to reduce artificial sweeteners.  Hernia   A large hernia causes early satiety and abdominal pain, managed with dietary changes and medication. Surgery is not recommended due to age and risks. Advise eating smaller, more frequent meals and continue Prilosec for acid reflux management.  Pelvic Fracture   Last year's pelvic fracture has not fully healed, requiring a walker for mobility. Continue using the walker for mobility support.  Fall precautions advised.  General Health Maintenance   Her weight has increased to 93 pounds, but she experiences nausea with Ensure. Order laboratory tests.  Goals of Care   Discussed handicap form and DNR status. She desires independence despite limitations. Complete the handicap form for [facility] submission and provide DNR documentation.  Return in about 6 months (around 09/03/2024).   Clotilda JONELLE Single, MD

## 2024-03-03 NOTE — Telephone Encounter (Signed)
 Ok

## 2024-03-03 NOTE — Telephone Encounter (Signed)
 Copied from CRM #[id number]. Topic: Clinical - Prescription Issue >> Mar 03, 2024  2:48 PM Stacey [name] wrote: Reason for CRM: Patient accidentally gave wrong pharmacy for refill at appointment prescriptions for medication, traMADol  (ULTRAM ) 50 MG tablet and atorvastatin  (LIPITOR) 10 MG tablet need to be sent to  [facility] - [place], [place] - [address] [place] [address] Phone: [phone] Fax: [phone] Hours: Not open 24 hours

## 2024-03-10 ENCOUNTER — Ambulatory Visit: Admitting: Podiatry

## 2024-03-10 ENCOUNTER — Ambulatory Visit (INDEPENDENT_AMBULATORY_CARE_PROVIDER_SITE_OTHER): Payer: Self-pay

## 2024-03-10 ENCOUNTER — Ambulatory Visit: Payer: Self-pay | Admitting: Family Medicine

## 2024-03-10 DIAGNOSIS — Z7901 Long term (current) use of anticoagulants: Secondary | ICD-10-CM | POA: Diagnosis not present

## 2024-03-10 LAB — POCT INR: INR: 3.1 — AB (ref 2.0–3.0)

## 2024-03-10 NOTE — Progress Notes (Signed)
 Patient's daughter, Romero, helps test INR with home [employer] analyzer and submitted results via portal.  Hold warfarin today and then continue 1 tablet daily except take 1/2 tablet on Monday, Wednesday and Friday. Recheck in 2 week, on 03/24/24. LVM for pt's daughter, Romero, and pt with dosing and recheck date.

## 2024-03-10 NOTE — Patient Instructions (Addendum)
 Pre visit review using our clinic review tool, if applicable. No additional management support is needed unless otherwise documented below in the visit note.  Hold warfarin today and then continue 1 tablet daily except take 1/2 tablet on Monday, Wednesday and Friday. Recheck in 2 week, on 03/24/24.

## 2024-03-15 ENCOUNTER — Ambulatory Visit (INDEPENDENT_AMBULATORY_CARE_PROVIDER_SITE_OTHER): Admitting: Podiatry

## 2024-03-15 DIAGNOSIS — Z91199 Patient's noncompliance with other medical treatment and regimen due to unspecified reason: Secondary | ICD-10-CM

## 2024-03-15 NOTE — Progress Notes (Signed)
 No show

## 2024-03-17 ENCOUNTER — Other Ambulatory Visit: Payer: Self-pay | Admitting: Family Medicine

## 2024-03-17 ENCOUNTER — Encounter: Payer: Self-pay | Admitting: Family Medicine

## 2024-03-17 DIAGNOSIS — E782 Mixed hyperlipidemia: Secondary | ICD-10-CM

## 2024-03-25 ENCOUNTER — Ambulatory Visit (INDEPENDENT_AMBULATORY_CARE_PROVIDER_SITE_OTHER): Payer: Self-pay

## 2024-03-25 DIAGNOSIS — Z7901 Long term (current) use of anticoagulants: Secondary | ICD-10-CM | POA: Diagnosis not present

## 2024-03-25 DIAGNOSIS — I4821 Permanent atrial fibrillation: Secondary | ICD-10-CM | POA: Diagnosis not present

## 2024-03-25 LAB — POCT INR: INR: 2.2 (ref 2.0–3.0)

## 2024-03-25 NOTE — Progress Notes (Signed)
 Patient's daughter, Romero, helps test INR with home [employer] analyzer and submitted results via portal.  Continue 1 tablet daily except take 1/2 tablet on Monday, Wednesday and Friday. Recheck in 4 week, on 04/21/24. LVM for pt's daughter, Romero, and pt with dosing and recheck date.

## 2024-03-25 NOTE — Patient Instructions (Addendum)
 Pre visit review using our clinic review tool, if applicable. No additional management support is needed unless otherwise documented below in the visit note.  Continue 1 tablet daily except take 1/2 tablet on Monday, Wednesday and Friday. Recheck in 4 week, on 04/21/24.

## 2024-03-30 ENCOUNTER — Encounter: Payer: Self-pay | Admitting: Podiatry

## 2024-03-30 ENCOUNTER — Ambulatory Visit (INDEPENDENT_AMBULATORY_CARE_PROVIDER_SITE_OTHER): Admitting: Podiatry

## 2024-03-30 DIAGNOSIS — M79674 Pain in right toe(s): Secondary | ICD-10-CM | POA: Diagnosis not present

## 2024-03-30 DIAGNOSIS — M79675 Pain in left toe(s): Secondary | ICD-10-CM | POA: Diagnosis not present

## 2024-03-30 DIAGNOSIS — I739 Peripheral vascular disease, unspecified: Secondary | ICD-10-CM

## 2024-03-30 DIAGNOSIS — B351 Tinea unguium: Secondary | ICD-10-CM

## 2024-03-30 DIAGNOSIS — M05771 Rheumatoid arthritis with rheumatoid factor of right ankle and foot without organ or systems involvement: Secondary | ICD-10-CM

## 2024-03-30 DIAGNOSIS — M05772 Rheumatoid arthritis with rheumatoid factor of left ankle and foot without organ or systems involvement: Secondary | ICD-10-CM

## 2024-03-30 NOTE — Progress Notes (Signed)
  Subjective:  Patient ID: Stacey [name], female    DOB: 11/03/1932,   MRN: [id number]  Chief Complaint  Patient presents with   Nail Problem    I'm here for a nail clipping.    89 y.o. female presents for concern of thickened elongated and painful nails that are difficult to trim. Requesting to have them trimmed today. Relates burning and tingling in their feet. Patient has a history of PAD and on chronic anticoagulation.        PCP:  Mercer Clotilda SAUNDERS, MD    . Denies any other pedal complaints. Denies n/v/f/c.   Past Medical History:  Diagnosis Date   Age-related osteoporosis without current pathological fracture    Per [facility] EMR system, [facility]   Atrial fibrillation (HCC)    Chronic diastolic heart failure (HCC)    Per [facility] EMR system, [facility]   Diaphragmatic hernia without obstruction or gangrene    Per [facility] EMR system, [employer] [facility]   Fracture of left superior rim of pubis with routine healing    Per [facility] EMR system, [employer] [facility]   Gastroesophageal reflux disease    Per [facility] EMR system, [employer] [facility]   Hernia of abdominal cavity    History of fall    Per [facility] EMR system, [employer] [facility]   Hyperlipemia    Per [facility] EMR system, [employer] [facility]   Kyphosis    Polyneuropathy    Per [facility] EMR system, [employer] [facility]   RA (rheumatoid arthritis) (HCC)    Per [facility] EMR system, [facility]    Objective:  Physical Exam: Vascular: DP/PT pulses 2/4 bilateral. CFT <3 seconds. Normal hair growth on digits. No edema.  Skin. No lacerations or abrasions bilateral feet.Right heel ulcer healed   .Mild erythema surrounding, improved.  Right second digit -healed. Hyperkeratosis dista left hallux. No purulence noted. Nails 1-5 on the right and hallux nail on left are thickened and elongated with subungal debris.  Musculoskeletal: MMT 5/5 bilateral lower extremities in DF, PF, Inversion and  Eversion. Deceased ROM in DF of ankle joint. Tender to wound area.  Neurological: Sensation intact to light touch.   Assessment:   1. Pain due to onychomycosis of toenails of both feet   2. PAD (peripheral artery disease) (HCC)   3. Rheumatoid arthritis involving both feet with positive rheumatoid factor (HCC)        Plan:  Patient was evaluated and treated and all questions answered. -Mechanically debrided all nails 1-5 bilateral using sterile nail nipper and filed with dremel without incident  -No open wounds today.  -Small pre-ulcerative area on dorsum of right second digit and distal left hallux covered with neosporin and bandaid.  -Answered all patient questions -Patient to return  in 3 months for at risk foot care -Patient advised to call the office if any problems or questions arise in the meantime.   Asberry Failing, DPM

## 2024-04-09 ENCOUNTER — Other Ambulatory Visit: Payer: Self-pay | Admitting: Family Medicine

## 2024-04-09 DIAGNOSIS — M150 Primary generalized (osteo)arthritis: Secondary | ICD-10-CM

## 2024-04-09 DIAGNOSIS — M069 Rheumatoid arthritis, unspecified: Secondary | ICD-10-CM

## 2024-04-09 DIAGNOSIS — G629 Polyneuropathy, unspecified: Secondary | ICD-10-CM

## 2024-04-09 MED ORDER — TRAMADOL HCL 50 MG PO TABS: 50.0000 mg | ORAL_TABLET | ORAL | 1 refills | Status: DC

## 2024-04-09 MED ORDER — GABAPENTIN 400 MG PO CAPS: 400.0000 mg | ORAL_CAPSULE | Freq: Every day | ORAL | 3 refills | Status: AC

## 2024-04-20 ENCOUNTER — Telehealth: Payer: Self-pay

## 2024-04-20 NOTE — Telephone Encounter (Signed)
 Called patient referral was sent to [facility] [phone], patient has information

## 2024-04-20 NOTE — Telephone Encounter (Signed)
 Copied from CRM #[id number]. Topic: Clinical - Medical Advice >> Apr 16, 2024 12:10 PM [place] A wrote: Reason for CRM: Patient called because she said her lat visit with Dr. Mercer they discussed her going to a Gastro Doctor but has not heard anything since July regarding that. Please call

## 2024-04-22 ENCOUNTER — Telehealth: Payer: Self-pay

## 2024-04-22 NOTE — Telephone Encounter (Signed)
 LVM for pt's daughter, Romero, that it is time to test pt's INR.

## 2024-04-23 ENCOUNTER — Ambulatory Visit (INDEPENDENT_AMBULATORY_CARE_PROVIDER_SITE_OTHER): Payer: Self-pay

## 2024-04-23 DIAGNOSIS — Z7901 Long term (current) use of anticoagulants: Secondary | ICD-10-CM

## 2024-04-23 LAB — POCT INR: INR: 3.3 — AB (ref 2.0–3.0)

## 2024-04-23 NOTE — Progress Notes (Signed)
 Patient's daughter, Romero, helps test INR with home [employer] analyzer and submitted results via portal.  Hold warfarin today and then continue 1 tablet daily except take 1/2 tablet on Monday, Wednesday and Friday. Recheck in 2 week, on 05/05/24. LVM for pt's daughter, Romero, with dosing instructions and recheck date. Contacted pt with dosing and recheck date. Pt requested lab results and was confused about what she was to do with her B12. Advised pt of PCP lab msg from 7/23. Advised she should decrease B12 intake to 3 times weekly. Pt verbalized understanding and said she will make a 6 month f/u per PCP.  Sent msg to PCP with update on B12.

## 2024-04-23 NOTE — Patient Instructions (Addendum)
 Pre visit review using our clinic review tool, if applicable. No additional management support is needed unless otherwise documented below in the visit note.  Hold warfarin today and then continue 1 tablet daily except take 1/2 tablet on Monday, Wednesday and Friday. Recheck in 2 week, on 05/05/24.

## 2024-04-23 NOTE — Telephone Encounter (Signed)
 Thanks for the update

## 2024-04-23 NOTE — Telephone Encounter (Signed)
 Contacted pt with warfarin dosing instructions. Pt requested lab results and was confused about what she was to do with her B12. Advised pt of PCP lab msg from 7/23. Advised she should decrease B12 intake to 3 times weekly. Pt verbalized understanding and said she will make a 6 month f/u per PCP.

## 2024-04-26 ENCOUNTER — Other Ambulatory Visit: Payer: Self-pay

## 2024-04-26 DIAGNOSIS — I482 Chronic atrial fibrillation, unspecified: Secondary | ICD-10-CM

## 2024-04-26 DIAGNOSIS — I509 Heart failure, unspecified: Secondary | ICD-10-CM

## 2024-04-27 MED ORDER — METOPROLOL TARTRATE 50 MG PO TABS: 50.0000 mg | ORAL_TABLET | Freq: Two times a day (BID) | ORAL | 0 refills | Status: DC

## 2024-05-03 ENCOUNTER — Other Ambulatory Visit: Payer: Self-pay | Admitting: Family Medicine

## 2024-05-03 DIAGNOSIS — F419 Anxiety disorder, unspecified: Secondary | ICD-10-CM

## 2024-05-06 ENCOUNTER — Telehealth: Payer: Self-pay

## 2024-05-06 NOTE — Telephone Encounter (Signed)
 Pt tests INR at home with help from her daughter, Romero. LVM for Romero reporting it is time to test.

## 2024-05-07 ENCOUNTER — Ambulatory Visit (INDEPENDENT_AMBULATORY_CARE_PROVIDER_SITE_OTHER): Payer: Self-pay

## 2024-05-07 DIAGNOSIS — Z7901 Long term (current) use of anticoagulants: Secondary | ICD-10-CM | POA: Diagnosis not present

## 2024-05-07 LAB — POCT INR: INR: 2.4 (ref 2.0–3.0)

## 2024-05-07 NOTE — Progress Notes (Signed)
 Patient's daughter, Romero, helps test INR with home [employer] analyzer and submitted results via portal.  Continue 1 tablet daily except take 1/2 tablet on Monday, Wednesday and Friday. Recheck in 3 week, on 05/26/24. Contacted pt with dosing and recheck date. Pt verbalized understanding.

## 2024-05-07 NOTE — Patient Instructions (Addendum)
 Pre visit review using our clinic review tool, if applicable. No additional management support is needed unless otherwise documented below in the visit note.  Continue 1 tablet daily except take 1/2 tablet on Monday, Wednesday and Friday. Recheck in 3 week, on 05/26/24.

## 2024-05-11 ENCOUNTER — Telehealth: Payer: Self-pay | Admitting: *Deleted

## 2024-05-11 ENCOUNTER — Encounter: Payer: Self-pay | Admitting: Family Medicine

## 2024-05-11 NOTE — Telephone Encounter (Signed)
 Copied from CRM #[id number]. Topic: General - Other >> May 11, 2024  1:38 PM Paige D wrote: Reason for CRM: Pt daughter Romero calling in regards to mothers medication tramadol . Pt daughter just confirmed with E script they only filled 30 day supply not 90. Please reach out to pt daughter [place]

## 2024-05-11 NOTE — Telephone Encounter (Signed)
 Spoke with patient, it is to early to refill medication, patient is aware

## 2024-05-11 NOTE — Telephone Encounter (Signed)
 Spoke with patient and daughter, medication is not due for a refill until 10/30 patient would have to wait till then to have medication filled

## 2024-05-27 ENCOUNTER — Ambulatory Visit (INDEPENDENT_AMBULATORY_CARE_PROVIDER_SITE_OTHER): Payer: Self-pay

## 2024-05-27 DIAGNOSIS — Z7901 Long term (current) use of anticoagulants: Secondary | ICD-10-CM | POA: Diagnosis not present

## 2024-05-27 LAB — POCT INR: INR: 2.4 (ref 2.0–3.0)

## 2024-05-27 NOTE — Progress Notes (Signed)
 Patient's daughter, Romero, helps test INR with home Ac[employer] analyzer and submitted results via portal.  Continue 1 tablet daily except take 1/2 tablet on Monday, Wednesday and Friday. Recheck in 3 week, on 06/16/24. Per pt's daughter Romero, no need to contact her or pt if INR is in range and there will not be any changes to dosing.

## 2024-05-27 NOTE — Patient Instructions (Addendum)
 Pre visit review using our clinic review tool, if applicable. No additional management support is needed unless otherwise documented below in the visit note.  Continue 1 tablet daily except take 1/2 tablet on Monday, Wednesday and Friday. Recheck in 3 week, on 06/16/24.

## 2024-06-01 ENCOUNTER — Other Ambulatory Visit: Payer: Self-pay | Admitting: Family Medicine

## 2024-06-01 DIAGNOSIS — M069 Rheumatoid arthritis, unspecified: Secondary | ICD-10-CM

## 2024-06-01 DIAGNOSIS — M150 Primary generalized (osteo)arthritis: Secondary | ICD-10-CM

## 2024-06-16 ENCOUNTER — Ambulatory Visit (INDEPENDENT_AMBULATORY_CARE_PROVIDER_SITE_OTHER): Payer: Self-pay

## 2024-06-16 DIAGNOSIS — I4821 Permanent atrial fibrillation: Secondary | ICD-10-CM | POA: Diagnosis not present

## 2024-06-16 DIAGNOSIS — Z7901 Long term (current) use of anticoagulants: Secondary | ICD-10-CM | POA: Diagnosis not present

## 2024-06-16 LAB — POCT INR: INR: 2.1 (ref 2.0–3.0)

## 2024-06-16 NOTE — Progress Notes (Signed)
 Patient's daughter, Romero, helps test INR with home Ac[employer] analyzer and submitted results via portal.  Continue 1 tablet daily except take 1/2 tablet on Monday, Wednesday and Friday. Recheck in 4 week, on 07/14/24, due to holiday. Per pt's daughter Romero, no need to contact her or pt if INR is in range and there will not be any changes to dosing.

## 2024-06-16 NOTE — Patient Instructions (Addendum)
 Pre visit review using our clinic review tool, if applicable. No additional management support is needed unless otherwise documented below in the visit note.  Continue 1 tablet daily except take 1/2 tablet on Monday, Wednesday and Friday. Recheck in 4 week, on 07/14/24, due to holiday.

## 2024-06-30 ENCOUNTER — Ambulatory Visit: Admitting: Podiatry

## 2024-06-30 ENCOUNTER — Encounter: Payer: Self-pay | Admitting: Podiatry

## 2024-06-30 DIAGNOSIS — B351 Tinea unguium: Secondary | ICD-10-CM

## 2024-06-30 DIAGNOSIS — M79674 Pain in right toe(s): Secondary | ICD-10-CM

## 2024-06-30 DIAGNOSIS — M79675 Pain in left toe(s): Secondary | ICD-10-CM

## 2024-06-30 DIAGNOSIS — I739 Peripheral vascular disease, unspecified: Secondary | ICD-10-CM | POA: Diagnosis not present

## 2024-06-30 NOTE — Progress Notes (Signed)
  Subjective:  Patient ID: Stacey [name], female    DOB: 1933-06-21,   MRN: [id number]  Chief Complaint  Patient presents with   Nail Problem    Dr. Joya cuts my toenails.   Callouses    The callus on my heel is thick.  It hurts.    89 y.o. female presents for concern of thickened elongated and painful nails that are difficult to trim. Requesting to have them trimmed today. Relates burning and tingling in their feet. Patient has a history of PAD and on chronic anticoagulation.        PCP:  Mercer Clotilda SAUNDERS, MD    . Denies any other pedal complaints. Denies n/v/f/c.   Past Medical History:  Diagnosis Date   Age-related osteoporosis without current pathological fracture    Per [facility] EMR system, [facility]   Atrial fibrillation (HCC)    Chronic diastolic heart failure (HCC)    Per [facility] EMR system, [facility]   Diaphragmatic hernia without obstruction or gangrene    Per [facility] EMR system, [employer] [facility]   Fracture of left superior rim of pubis with routine healing    Per [facility] EMR system, [employer] [facility]   Gastroesophageal reflux disease    Per [facility] EMR system, [employer] [facility]   Hernia of abdominal cavity    History of fall    Per [facility] EMR system, [employer] [facility]   Hyperlipemia    Per [facility] EMR system, [employer] [facility]   Kyphosis    Polyneuropathy    Per [facility] EMR system, [employer] [facility]   RA (rheumatoid arthritis) (HCC)    Per [facility] EMR system, [facility]    Objective:  Physical Exam: Vascular: DP/PT pulses 2/4 bilateral. CFT <3 seconds. Normal hair growth on digits. No edema.  Skin. No lacerations or abrasions bilateral feet.Right heel ulcer healed   .Mild erythema surrounding, improved.  Right second digit -healed. Hyperkeratosis dista left hallux. No purulence noted. Nails 1-5 on the right and hallux nail on left are thickened and elongated with subungal debris.  Musculoskeletal: MMT 5/5  bilateral lower extremities in DF, PF, Inversion and Eversion. Deceased ROM in DF of ankle joint. Tender to wound area.  Neurological: Sensation intact to light touch.   Assessment:   1. Pain due to onychomycosis of toenails of both feet   2. PAD (peripheral artery disease)         Plan:  Patient was evaluated and treated and all questions answered. -Mechanically debrided all nails 1-5 bilateral using sterile nail nipper and filed with dremel without incident  -No open wounds today.  -Small pre-ulcerative area on dorsum of right second digit and distal left hallux covered with neosporin and bandaid.  -Answered all patient questions -Patient to return  in 3 months for at risk foot care -Patient advised to call the office if any problems or questions arise in the meantime.   Asberry Joya, DPM

## 2024-07-06 ENCOUNTER — Ambulatory Visit (INDEPENDENT_AMBULATORY_CARE_PROVIDER_SITE_OTHER): Payer: Self-pay

## 2024-07-06 DIAGNOSIS — Z7901 Long term (current) use of anticoagulants: Secondary | ICD-10-CM

## 2024-07-06 LAB — POCT INR: INR: 2.3 (ref 2.0–3.0)

## 2024-07-06 NOTE — Progress Notes (Addendum)
 Patient's daughter, Romero, helps test INR with home [employer] analyzer and submitted results via portal.  Continue 1 tablet daily except take 1/2 tablet on Monday, Wednesday and Friday. Recheck in 3 week, on 07/27/24, due to holiday. Per pt's daughter Romero, no need to contact her or pt if INR is in range and there will not be any changes to dosing.   Medical screening examination/treatment/procedure(s) were performed by non-physician practitioner and as supervising physician I was immediately available for consultation/collaboration.  I agree with above. Karlynn Noel, MD

## 2024-07-06 NOTE — Patient Instructions (Addendum)
 Pre visit review using our clinic review tool, if applicable. No additional management support is needed unless otherwise documented below in the visit note.  Continue 1 tablet daily except take 1/2 tablet on Monday, Wednesday and Friday. Recheck in 3 week, on 07/27/24, due to holiday.

## 2024-07-14 ENCOUNTER — Other Ambulatory Visit: Payer: Self-pay | Admitting: Family Medicine

## 2024-07-14 DIAGNOSIS — J302 Other seasonal allergic rhinitis: Secondary | ICD-10-CM

## 2024-07-22 ENCOUNTER — Ambulatory Visit: Admitting: Gastroenterology

## 2024-07-26 ENCOUNTER — Other Ambulatory Visit: Payer: Self-pay | Admitting: Physician Assistant

## 2024-07-26 DIAGNOSIS — I509 Heart failure, unspecified: Secondary | ICD-10-CM

## 2024-07-26 DIAGNOSIS — I482 Chronic atrial fibrillation, unspecified: Secondary | ICD-10-CM

## 2024-07-28 ENCOUNTER — Telehealth: Payer: Self-pay

## 2024-07-28 NOTE — Telephone Encounter (Signed)
 Pt tests INR at home with help of her daughter, Romero.  Pt was due to test this week.  LVM reminding to test.

## 2024-07-29 ENCOUNTER — Ambulatory Visit: Payer: Self-pay

## 2024-07-29 DIAGNOSIS — Z7901 Long term (current) use of anticoagulants: Secondary | ICD-10-CM | POA: Diagnosis not present

## 2024-07-29 LAB — POCT INR: INR: 2.4 (ref 2.0–3.0)

## 2024-07-29 NOTE — Patient Instructions (Addendum)
 Pre visit review using our clinic review tool, if applicable. No additional management support is needed unless otherwise documented below in the visit note.  Continue 1 tablet daily except take 1/2 tablet on Monday, Wednesday and Friday. Recheck in 4 weeks, on 1/14.

## 2024-07-29 NOTE — Progress Notes (Signed)
 Patient's daughter, Romero, helps test INR with home [employer] analyzer and submitted results via portal.  Continue 1 tablet daily except take 1/2 tablet on Monday, Wednesday and Friday. Recheck in 4 weeks, on 1/14 Per pt's daughter Romero, no need to contact her or pt if INR is in range and there will not be any changes to dosing.

## 2024-08-02 ENCOUNTER — Other Ambulatory Visit: Payer: Self-pay | Admitting: Family Medicine

## 2024-08-02 DIAGNOSIS — F419 Anxiety disorder, unspecified: Secondary | ICD-10-CM

## 2024-08-07 ENCOUNTER — Other Ambulatory Visit: Payer: Self-pay | Admitting: Family Medicine

## 2024-08-07 DIAGNOSIS — M069 Rheumatoid arthritis, unspecified: Secondary | ICD-10-CM

## 2024-08-07 DIAGNOSIS — M150 Primary generalized (osteo)arthritis: Secondary | ICD-10-CM

## 2024-08-10 NOTE — Telephone Encounter (Signed)
 Refilled once until Dr Mercer returns.    Wolm LELON Scarlet MD [facility]

## 2024-08-17 ENCOUNTER — Telehealth: Payer: Self-pay | Admitting: Family Medicine

## 2024-08-17 NOTE — Telephone Encounter (Signed)
 Copied from CRM [id number]. Topic: Clinical - Prescription Issue >> Aug 17, 2024  9:05 AM [name] wrote: Reason for CRM: Pt states her tramadol  was [facility] yesterday and it takes about 5-7 days until she gets it, but she is currently out. She was wondering if Dr. Mercer could send her a few pills to the pharmacy [facility] on [address] to last her until she gets her medication through [facility]. Please advise. Call back number [phone].

## 2024-08-18 ENCOUNTER — Other Ambulatory Visit: Payer: Self-pay | Admitting: Family Medicine

## 2024-08-18 ENCOUNTER — Ambulatory Visit (INDEPENDENT_AMBULATORY_CARE_PROVIDER_SITE_OTHER): Payer: Self-pay

## 2024-08-18 DIAGNOSIS — M069 Rheumatoid arthritis, unspecified: Secondary | ICD-10-CM

## 2024-08-18 DIAGNOSIS — M150 Primary generalized (osteo)arthritis: Secondary | ICD-10-CM

## 2024-08-18 DIAGNOSIS — Z7901 Long term (current) use of anticoagulants: Secondary | ICD-10-CM | POA: Diagnosis not present

## 2024-08-18 LAB — POCT INR: INR: 2.9 (ref 2.0–3.0)

## 2024-08-18 MED ORDER — TRAMADOL HCL 50 MG PO TABS: ORAL_TABLET | ORAL | 0 refills | Status: DC

## 2024-08-18 NOTE — Progress Notes (Signed)
 Patient's daughter, Romero, helps test INR with home [employer] analyzer and submitted results via portal.  Continue 1 tablet daily except take 1/2 tablet on Monday, Wednesday and Friday. Recheck in 3 weeks, on 1/28. Per pt's daughter Romero, no need to contact her or pt if INR is in range and there will not be any changes to dosing.

## 2024-08-18 NOTE — Telephone Encounter (Signed)
 Pt has been out of tramadol . Normally take 4 tabs a day. Will not receive until Monday, 1/12.  Forwarding msg to PCP as high priority. This pt has been on this medication for a long time and has chronic pain.

## 2024-08-18 NOTE — Telephone Encounter (Signed)
 Limited supply of tramadol  sent to local pharmacy.

## 2024-08-18 NOTE — Patient Instructions (Addendum)
 Pre visit review using our clinic review tool, if applicable. No additional management support is needed unless otherwise documented below in the visit note.  Continue 1 tablet daily except take 1/2 tablet on Monday, Wednesday and Friday. Recheck in 3 weeks, on 1/28.

## 2024-08-18 NOTE — Telephone Encounter (Signed)
 Copied from CRM [id number]. Topic: Clinical - Prescription Issue >> Aug 17, 2024  9:05 AM [name] wrote: Reason for CRM: Pt states her tramadol  was [facility] yesterday and it takes about 5-7 days until she gets it, but she is currently out. She was wondering if Dr. Mercer could send her a few pills to the pharmacy [facility] on [address] to last her until she gets her medication [facility]. Please advise. Call back number [phone]. >> Aug 18, 2024 11:35 AM [name] wrote: Patient is calling back following up , states she takes 4 pills a day and needs it until she gets her shipment in on Monday

## 2024-08-19 NOTE — Telephone Encounter (Signed)
 Noted

## 2024-08-25 ENCOUNTER — Other Ambulatory Visit: Payer: Self-pay | Admitting: Family Medicine

## 2024-08-25 DIAGNOSIS — F32A Depression, unspecified: Secondary | ICD-10-CM

## 2024-08-31 ENCOUNTER — Ambulatory Visit (INDEPENDENT_AMBULATORY_CARE_PROVIDER_SITE_OTHER)
Admission: RE | Admit: 2024-08-31 | Discharge: 2024-08-31 | Disposition: A | Source: Ambulatory Visit | Attending: Gastroenterology | Admitting: Gastroenterology

## 2024-08-31 ENCOUNTER — Encounter: Payer: Self-pay | Admitting: Gastroenterology

## 2024-08-31 ENCOUNTER — Ambulatory Visit: Admitting: Gastroenterology

## 2024-08-31 ENCOUNTER — Ambulatory Visit: Payer: Self-pay | Admitting: Gastroenterology

## 2024-08-31 VITALS — BP 120/60 | HR 60 | Ht 63.0 in | Wt 91.0 lb

## 2024-08-31 DIAGNOSIS — R159 Full incontinence of feces: Secondary | ICD-10-CM | POA: Diagnosis not present

## 2024-08-31 DIAGNOSIS — K5900 Constipation, unspecified: Secondary | ICD-10-CM

## 2024-08-31 DIAGNOSIS — R194 Change in bowel habit: Secondary | ICD-10-CM | POA: Insufficient documentation

## 2024-08-31 NOTE — Progress Notes (Signed)
 "    08/31/2024 Stacey [name] [id number] [birth year]   Discussed the use of AI scribe software for clinical note transcription with the patient, who gave verbal consent to proceed.  History of Present Illness Stacey [name] is an 89 year old female with urinary and fecal incontinence and prior pelvic fracture who presents for evaluation of chronic bowel dysfunction.  Her granddaughter is with her today.  She is new to our office and her car is being assigned to Dr. Shila.  For the past two and a half to three years, she has experienced involuntary release of stool without warning or urge, describing episodes where stool just comes out and she is unable to reach the bathroom in time, even when it is nearby. Frequency and severity of these episodes have remained stable. Bowel movements occur in varying frequency, with most episodes being dramatic and distressing, though some are fairly decent and not bothersome. Stool consistency varies from loose to diarrheal, with a particularly severe episode the morning of the visit. Periods of constipation occur, especially after a regular bowel movement, with subsequent small bowel movements two to three days later. No blood in stool is reported. Mild abdominal discomfort occurs occasionally, but she denies persistent or significant abdominal pain.  Senokot at night previously resulted in a satisfactory bowel movement the following morning, but she has not continued its use. Metamucil powder was used in the past but discontinued due to uncertain benefit. Imodium was taken the morning of the visit to manage diarrhea. She expresses interest in medications for loose stool and surprising stool as seen in television commercials. Fiber supplements have not been used consistently and their effect is unknown.  Urinary incontinence and frequent urination are present. Diapers are worn due to frequent urination and inability to reach the bathroom in time.  Urinary frequency decreases when diuretics are withheld. Diapers are worn due to frequent urination and inability to reach the bathroom in time. Urinary frequency decreases when diuretics are withheld. Diuretic name and dose are not specified.  A pelvic fracture last year resulted in persistent pain when sitting on hard surfaces, especially when using the toilet. A foam mat is used for comfort. She previously exercised regularly for 45 years, including sit-ups, but is no longer able to do so and notes general muscle weakness with aging.  She lives with her daughter, and son-in-law. Meals are prepared by her daughter and son-in-law, which she enjoys but cannot finish due only being able to eat small amounts at a time and feeling miserable if she eats too much. She consumes small, frequent meals and eats a lot of vegetables. A walker is used at home and she is not in a wheelchair all the time.    Past Medical History:  Diagnosis Date   Age-related osteoporosis without current pathological fracture    Per [facility] EMR system, [facility]   Atrial fibrillation [employer])    Chronic diastolic heart failure (HCC)    Per [facility] EMR system, [facility]   Diaphragmatic hernia without obstruction or gangrene    Per [facility] EMR system, [employer] [facility]   Fracture of left superior rim of pubis with routine healing    Per [facility] EMR system, [employer] [facility]   Gastroesophageal reflux disease    Per [facility] EMR system, [employer] [facility]   Hernia of abdominal cavity    History of fall    Per [employer] [facility] system, [employer]  Care   Hyperlipemia    Per [facility] EMR system, [facility]   Kyphosis    Polyneuropathy    Per [facility] EMR system, [facility]   RA (rheumatoid arthritis) [facility])    Per [facility] EMR system, [facility]   Past Surgical History:  Procedure Laterality Date   ABDOMINAL HYSTERECTOMY     CATARACT EXTRACTION, BILATERAL     REPLACEMENT  TOTAL HIP W/  RESURFACING IMPLANTS     TONSILLECTOMY      reports that she has quit smoking. She has never used smokeless tobacco. She reports that she does not drink alcohol and does not use drugs. family history includes Healthy in her mother; Heart failure in her father. Allergies[1]    Outpatient Encounter Medications as of 08/31/2024  Medication Sig   acetaminophen  (TYLENOL ) 500 MG tablet Take 500 mg by mouth 3 (three) times daily as needed (for R.A. pain).   atorvastatin  (LIPITOR) 10 MG tablet TAKE 1 TABLET DAILY   cadexomer iodine  (IODOSORB) 0.9 % gel Apply 1 Application topically 1 day or 1 dose. Cleanse wound with wound wash solution.  Apply small dab of iodorsorb and cover with a dry sterile dressing daily   Cholecalciferol  (VITAMIN D3) 1000 units CAPS Take 1,000 Units by mouth daily.   cyanocobalamin  1000 MCG tablet Take 1,000 mcg by mouth daily.   feeding supplement (ENSURE ENLIVE / ENSURE PLUS) LIQD Take 237 mLs by mouth 2 (two) times daily between meals.   fexofenadine  (ALLEGRA ) 60 MG tablet TAKE 1 TABLET DAILY AS NEEDED FOR ALLERGIES OR RHINITIS   gabapentin  (NEURONTIN ) 400 MG capsule Take 1 capsule (400 mg total) by mouth daily.   meclizine  (ANTIVERT ) 12.5 MG tablet Take 1 tablet (12.5 mg total) by mouth 3 (three) times daily as needed for dizziness.   metoprolol  tartrate (LOPRESSOR ) 50 MG tablet TAKE AS INSTRUCTED BY YOUR PRESCRIBER   Multiple Vitamin (MULTIVITAMIN) capsule Take 1 capsule by mouth daily with breakfast.   omeprazole  (PRILOSEC) 20 MG capsule TAKE 1 CAPSULE TWICE A DAY BEFORE MEALS   predniSONE  (DELTASONE ) 5 MG tablet TAKE 1 TABLET DAILY   sertraline  (ZOLOFT ) 100 MG tablet Take 1 tablet (100 mg total) by mouth daily.   torsemide  (DEMADEX ) 20 MG tablet Take 1 tablet (20 mg total) by mouth daily.   traMADol  (ULTRAM ) 50 MG tablet TAKE 2 TABLETS (100 MG) IN THE MORNING AND 1 TABLET IN THE AFTERNOON AND AT BEDTIME (SCHEDULED)   warfarin (COUMADIN ) 5 MG tablet TAKE 1  TABLET BY MOUTH DAILY EXCEPT TAKE ONE-HALF (1/2) TABLET ON MONDAY, WEDNESDAY, FRIDAY OR AS DIRECTED BY ANTICOAGULATION CLINIC   No facility-administered encounter medications on file as of 08/31/2024.     REVIEW OF SYSTEMS  : All other systems reviewed and negative except where noted in the History of Present Illness.   PHYSICAL EXAM: BP 120/60 (BP Location: Left Arm, Patient Position: Sitting, Cuff Size: Small)   Pulse 60   Ht 5' 3 (1.6 m)   Wt 91 lb (41.3 kg)   BMI 16.12 kg/m  General: Elderly female in no acute distress; frail, in a wheelchair Head: Normocephalic and atraumatic Eyes:  Sclerae anicteric, conjunctiva pink. Ears: Normal auditory acuity Lungs: Clear throughout to auscultation; no W/R/R. Heart: Irregularly irregular. Abdomen:  Soft, non-distended.  BS present.  Non-tender. Musculoskeletal: Symmetrical with no gross deformities  Skin: No lesions on visible extremities Extremities: No edema  Neurological: Alert oriented x 4, grossly non-focal Psychological:  Alert  and cooperative. Normal mood and affect  Assessment & Plan Fecal incontinence with constipation Chronic, multifactorial fecal incontinence with likely significant pelvic floor and sphincter muscle weakness, possibly compounded by constipation with overflow diarrhea and age-related changes. Full resolution is unlikely; focus is on symptom management and quality of life improvement. - Ordered abdominal x-ray to assess for retained stool and evaluate constipation as a contributor to overflow diarrhea. - Planned to review abdominal x-ray results and communicate findings and next steps to her daughter. - If significant stool burden is present, will treat constipation to prevent overflow diarrhea. - If no significant stool burden, will initiate daily Benefiber (2-3 teaspoons once daily) to bulk stool and improve consistency, with instructions to mix in liquids and take daily. - Advised against frequent use of  anti-diarrheal medications to avoid exacerbating constipation and the cycle of alternating diarrhea and constipation. - Discussed pelvic floor physical therapy for muscle weakness; she declined at this time. - Provided anticipatory guidance that full resolution is unlikely due to underlying muscle weakness and age-related changes, with focus on improving symptom management and quality of life.   CC:  Mercer Clotilda SAUNDERS, MD       [1]  Allergies Allergen Reactions   Sulfa Antibiotics Anaphylaxis   Tape Other (See Comments)    BRUISES VERY EASILY!!!!   Codeine Nausea And Vomiting   Hydroxychloroquine Other (See Comments)    GI upset   Shellfish Protein-Containing Drug Products Other (See Comments)    Daughter was unsure of the reaction- happened years ago and patient avoids foods that contain shellfish   Latex Rash   Neosporin Original [Bacitracin-Neomycin-Polymyxin] Rash   Polysporin [Bacitracin-Polymyxin B] Rash   "

## 2024-08-31 NOTE — Patient Instructions (Addendum)
 Your provider has requested that you have an abdominal x ray before leaving today. Please go to the basement floor to our Radiology department for the test.  _______________________________________________________  If your blood pressure at your visit was 140/90 or greater, please contact your primary care physician to follow up on this.  _______________________________________________________  If you are age 89 or older, your body mass index should be between 23-30. Your Body mass index is 16.12 kg/m. If this is out of the aforementioned range listed, please consider follow up with your Primary Care Provider.  If you are age 16 or younger, your body mass index should be between 19-25. Your Body mass index is 16.12 kg/m. If this is out of the aformentioned range listed, please consider follow up with your Primary Care Provider.   ________________________________________________________  The  GI providers would like to encourage you to use MYCHART to communicate with providers for non-urgent requests or questions.  Due to long hold times on the telephone, sending your provider a message by [facility] may be a faster and more efficient way to get a response.  Please allow 48 business hours for a response.  Please remember that this is for non-urgent requests.  _______________________________________________________  [facility] is using a team-based approach to care.  Your team is made up of your doctor and two to three APPS. Our APPS (Nurse Practitioners and Physician Assistants) work with your physician to ensure care continuity for you. They are fully qualified to address your health concerns and develop a treatment plan. They communicate directly with your gastroenterologist to care for you. Seeing the Advanced Practice Practitioners on your physician's team can help you by facilitating care more promptly, often allowing for earlier appointments, access to diagnostic testing,  procedures, and other specialty referrals.

## 2024-09-03 ENCOUNTER — Other Ambulatory Visit: Payer: Self-pay | Admitting: Family Medicine

## 2024-09-03 DIAGNOSIS — M069 Rheumatoid arthritis, unspecified: Secondary | ICD-10-CM

## 2024-09-03 DIAGNOSIS — M150 Primary generalized (osteo)arthritis: Secondary | ICD-10-CM

## 2024-09-06 ENCOUNTER — Other Ambulatory Visit: Payer: Self-pay | Admitting: Family Medicine

## 2024-09-06 ENCOUNTER — Encounter: Payer: Self-pay | Admitting: Family Medicine

## 2024-09-09 ENCOUNTER — Ambulatory Visit (INDEPENDENT_AMBULATORY_CARE_PROVIDER_SITE_OTHER): Payer: Self-pay

## 2024-09-09 ENCOUNTER — Telehealth: Payer: Self-pay

## 2024-09-09 DIAGNOSIS — Z7901 Long term (current) use of anticoagulants: Secondary | ICD-10-CM | POA: Diagnosis not present

## 2024-09-09 LAB — POCT INR: INR: 2.7 (ref 2.0–3.0)

## 2024-09-09 NOTE — Progress Notes (Signed)
 Patient's daughter, Romero, helps test INR with home [employer] analyzer and submitted results via portal.  Continue 1 tablet daily except take 1/2 tablet on Monday, Wednesday and Friday. Recheck in 3 weeks, on 2/18. Per pt's daughter Romero, no need to contact her or pt if INR is in range and there will not be any changes to dosing.

## 2024-09-09 NOTE — Patient Instructions (Addendum)
 Pre visit review using our clinic review tool, if applicable. No additional management support is needed unless otherwise documented below in the visit note.  Continue 1 tablet daily except take 1/2 tablet on Monday, Wednesday and Friday. Recheck in 3 weeks, on 2/18.

## 2024-09-09 NOTE — Telephone Encounter (Signed)
 Advised pt's daughter, Romero, it is time to test INR. She will test today.

## 2024-09-29 ENCOUNTER — Ambulatory Visit: Admitting: Podiatry
